# Patient Record
Sex: Female | Born: 1955
Health system: Southern US, Community
[De-identification: ages and names within clinical notes are randomized; demographics above are authoritative.]

## PROBLEM LIST (undated history)

## (undated) DIAGNOSIS — M353 Polymyalgia rheumatica: Secondary | ICD-10-CM

## (undated) DIAGNOSIS — M1990 Unspecified osteoarthritis, unspecified site: Secondary | ICD-10-CM

## (undated) DIAGNOSIS — L814 Other melanin hyperpigmentation: Secondary | ICD-10-CM

## (undated) DIAGNOSIS — I872 Venous insufficiency (chronic) (peripheral): Secondary | ICD-10-CM

## (undated) DIAGNOSIS — D649 Anemia, unspecified: Secondary | ICD-10-CM

## (undated) DIAGNOSIS — C801 Malignant (primary) neoplasm, unspecified: Secondary | ICD-10-CM

## (undated) DIAGNOSIS — T8859XA Other complications of anesthesia, initial encounter: Secondary | ICD-10-CM

## (undated) DIAGNOSIS — Z87442 Personal history of urinary calculi: Secondary | ICD-10-CM

## (undated) DIAGNOSIS — Z808 Family history of malignant neoplasm of other organs or systems: Secondary | ICD-10-CM

## (undated) DIAGNOSIS — Z6834 Body mass index (BMI) 34.0-34.9, adult: Secondary | ICD-10-CM

## (undated) HISTORY — PX: COLONOSCOPY: SHX174

## (undated) HISTORY — DX: Family history of malignant neoplasm of other organs or systems: Z80.8

## (undated) HISTORY — PX: EYE SURGERY: SHX253

## (undated) HISTORY — DX: Polymyalgia rheumatica: M35.3

## (undated) HISTORY — DX: Other melanin hyperpigmentation: L81.4

## (undated) HISTORY — DX: Body mass index (BMI) 34.0-34.9, adult: Z68.34

## (undated) HISTORY — DX: Anemia, unspecified: D64.9

## (undated) HISTORY — DX: Venous insufficiency (chronic) (peripheral): I87.2

---

## 2005-07-12 ENCOUNTER — Emergency Department (HOSPITAL_COMMUNITY): Admission: EM | Admit: 2005-07-12 | Discharge: 2005-07-13 | Payer: Self-pay | Admitting: Emergency Medicine

## 2005-07-17 ENCOUNTER — Ambulatory Visit: Payer: Self-pay | Admitting: Family Medicine

## 2005-08-25 ENCOUNTER — Ambulatory Visit: Payer: Self-pay | Admitting: Family Medicine

## 2005-09-11 ENCOUNTER — Ambulatory Visit: Payer: Self-pay | Admitting: Family Medicine

## 2005-09-11 ENCOUNTER — Encounter: Payer: Self-pay | Admitting: Family Medicine

## 2005-09-11 ENCOUNTER — Other Ambulatory Visit: Admission: RE | Admit: 2005-09-11 | Discharge: 2005-09-11 | Payer: Self-pay | Admitting: Family Medicine

## 2005-10-24 ENCOUNTER — Encounter: Admission: RE | Admit: 2005-10-24 | Discharge: 2005-10-24 | Payer: Self-pay | Admitting: Family Medicine

## 2005-11-14 ENCOUNTER — Encounter: Admission: RE | Admit: 2005-11-14 | Discharge: 2005-11-14 | Payer: Self-pay | Admitting: Family Medicine

## 2006-07-05 ENCOUNTER — Encounter: Admission: RE | Admit: 2006-07-05 | Discharge: 2006-07-05 | Payer: Self-pay | Admitting: Family Medicine

## 2007-01-02 ENCOUNTER — Encounter: Admission: RE | Admit: 2007-01-02 | Discharge: 2007-01-02 | Payer: Self-pay | Admitting: Family Medicine

## 2007-12-20 ENCOUNTER — Telehealth: Payer: Self-pay | Admitting: Family Medicine

## 2007-12-23 ENCOUNTER — Other Ambulatory Visit: Admission: RE | Admit: 2007-12-23 | Discharge: 2007-12-23 | Payer: Self-pay | Admitting: Family Medicine

## 2007-12-23 ENCOUNTER — Ambulatory Visit: Payer: Self-pay | Admitting: Family Medicine

## 2007-12-23 ENCOUNTER — Encounter: Payer: Self-pay | Admitting: Family Medicine

## 2007-12-23 DIAGNOSIS — I872 Venous insufficiency (chronic) (peripheral): Secondary | ICD-10-CM

## 2007-12-23 LAB — CONVERTED CEMR LAB
ALT: 16 units/L (ref 0–35)
AST: 20 units/L (ref 0–37)
Albumin: 4.3 g/dL (ref 3.5–5.2)
Alkaline Phosphatase: 42 units/L (ref 39–117)
BUN: 10 mg/dL (ref 6–23)
Basophils Absolute: 0 10*3/uL (ref 0.0–0.1)
Basophils Relative: 0.6 % (ref 0.0–1.0)
Bilirubin Urine: NEGATIVE
Bilirubin, Direct: 0.2 mg/dL (ref 0.0–0.3)
Blood in Urine, dipstick: NEGATIVE
CO2: 31 meq/L (ref 19–32)
Calcium: 9.8 mg/dL (ref 8.4–10.5)
Chloride: 105 meq/L (ref 96–112)
Cholesterol: 208 mg/dL (ref 0–200)
Creatinine, Ser: 0.6 mg/dL (ref 0.4–1.2)
Direct LDL: 133.2 mg/dL
Eosinophils Absolute: 0.1 10*3/uL (ref 0.0–0.7)
Eosinophils Relative: 1.4 % (ref 0.0–5.0)
GFR calc Af Amer: 136 mL/min
GFR calc non Af Amer: 112 mL/min
Glucose, Bld: 84 mg/dL (ref 70–99)
Glucose, Urine, Semiquant: NEGATIVE
HCT: 38.6 % (ref 36.0–46.0)
HDL: 61.5 mg/dL (ref >39.0)
Hemoglobin: 12.8 g/dL (ref 12.0–15.0)
Ketones, urine, test strip: NEGATIVE
Lymphocytes Relative: 42.4 % (ref 12.0–46.0)
MCHC: 33.1 g/dL (ref 30.0–36.0)
MCV: 92.3 fL (ref 78.0–100.0)
Monocytes Absolute: 0.3 10*3/uL (ref 0.1–1.0)
Monocytes Relative: 6.7 % (ref 3.0–12.0)
Neutro Abs: 2.5 10*3/uL (ref 1.4–7.7)
Neutrophils Relative %: 48.9 % (ref 43.0–77.0)
Nitrite: NEGATIVE
Platelets: 260 10*3/uL (ref 150–400)
Potassium: 4.4 meq/L (ref 3.5–5.1)
Protein, U semiquant: NEGATIVE
RBC: 4.18 M/uL (ref 3.87–5.11)
RDW: 12.2 % (ref 11.5–14.6)
Sodium: 143 meq/L (ref 135–145)
Specific Gravity, Urine: 1.015
TSH: 3.99 microintl units/mL (ref 0.35–5.50)
Total Bilirubin: 1.3 mg/dL — ABNORMAL HIGH (ref 0.3–1.2)
Total CHOL/HDL Ratio: 3.4
Total Protein: 7 g/dL (ref 6.0–8.3)
Triglycerides: 54 mg/dL (ref 0–149)
Urobilinogen, UA: 0.2
VLDL: 11 mg/dL (ref 0–40)
WBC Urine, dipstick: NEGATIVE
WBC: 5.1 10*3/uL (ref 4.5–10.5)
pH: 7.5

## 2008-01-02 ENCOUNTER — Encounter: Payer: Self-pay | Admitting: Family Medicine

## 2008-01-02 ENCOUNTER — Ambulatory Visit: Payer: Self-pay | Admitting: Internal Medicine

## 2008-01-10 ENCOUNTER — Ambulatory Visit: Payer: Self-pay | Admitting: Gastroenterology

## 2008-01-17 ENCOUNTER — Ambulatory Visit: Payer: Self-pay | Admitting: Gastroenterology

## 2008-02-09 ENCOUNTER — Telehealth: Payer: Self-pay | Admitting: *Deleted

## 2008-02-10 ENCOUNTER — Telehealth: Payer: Self-pay | Admitting: *Deleted

## 2009-02-05 ENCOUNTER — Ambulatory Visit: Payer: Self-pay | Admitting: Family Medicine

## 2009-02-05 LAB — CONVERTED CEMR LAB
ALT: 17 units/L (ref 0–35)
AST: 20 units/L (ref 0–37)
Albumin: 4.1 g/dL (ref 3.5–5.2)
Alkaline Phosphatase: 53 units/L (ref 39–117)
BUN: 14 mg/dL (ref 6–23)
Basophils Absolute: 0 10*3/uL (ref 0.0–0.1)
Basophils Relative: 1 % (ref 0.0–3.0)
Bilirubin Urine: NEGATIVE
Bilirubin, Direct: 0.1 mg/dL (ref 0.0–0.3)
CO2: 31 meq/L (ref 19–32)
Calcium: 9.4 mg/dL (ref 8.4–10.5)
Chloride: 108 meq/L (ref 96–112)
Cholesterol: 238 mg/dL — ABNORMAL HIGH (ref 0–200)
Creatinine, Ser: 0.7 mg/dL (ref 0.4–1.2)
Direct LDL: 166.3 mg/dL
Eosinophils Absolute: 0.1 10*3/uL (ref 0.0–0.7)
Eosinophils Relative: 2.3 % (ref 0.0–5.0)
GFR calc non Af Amer: 93.61 mL/min (ref >60)
Glucose, Bld: 76 mg/dL (ref 70–99)
Glucose, Urine, Semiquant: NEGATIVE
HCT: 38 % (ref 36.0–46.0)
HDL: 63.7 mg/dL (ref >39.00)
Hemoglobin: 12.9 g/dL (ref 12.0–15.0)
Ketones, urine, test strip: NEGATIVE
Lymphocytes Relative: 41.7 % (ref 12.0–46.0)
Lymphs Abs: 1.6 10*3/uL (ref 0.7–4.0)
MCHC: 33.8 g/dL (ref 30.0–36.0)
MCV: 89.3 fL (ref 78.0–100.0)
Monocytes Absolute: 0.4 10*3/uL (ref 0.1–1.0)
Monocytes Relative: 9 % (ref 3.0–12.0)
Neutro Abs: 1.8 10*3/uL (ref 1.4–7.7)
Neutrophils Relative %: 46 % (ref 43.0–77.0)
Nitrite: NEGATIVE
Platelets: 281 10*3/uL (ref 150.0–400.0)
Potassium: 3.9 meq/L (ref 3.5–5.1)
RBC: 4.26 M/uL (ref 3.87–5.11)
RDW: 12.9 % (ref 11.5–14.6)
Sodium: 144 meq/L (ref 135–145)
Specific Gravity, Urine: 1.015
TSH: 1.34 microintl units/mL (ref 0.35–5.50)
Total Bilirubin: 1.2 mg/dL (ref 0.3–1.2)
Total CHOL/HDL Ratio: 4
Total Protein: 7.3 g/dL (ref 6.0–8.3)
Triglycerides: 74 mg/dL (ref 0.0–149.0)
Urobilinogen, UA: 0.2
VLDL: 14.8 mg/dL (ref 0.0–40.0)
WBC Urine, dipstick: NEGATIVE
WBC: 3.9 10*3/uL — ABNORMAL LOW (ref 4.5–10.5)
pH: 8.5

## 2009-02-16 ENCOUNTER — Other Ambulatory Visit: Admission: RE | Admit: 2009-02-16 | Discharge: 2009-02-16 | Payer: Self-pay | Admitting: Family Medicine

## 2009-02-16 ENCOUNTER — Encounter: Payer: Self-pay | Admitting: Family Medicine

## 2009-02-16 ENCOUNTER — Ambulatory Visit: Payer: Self-pay | Admitting: Family Medicine

## 2010-02-18 ENCOUNTER — Encounter: Admission: RE | Admit: 2010-02-18 | Discharge: 2010-02-18 | Payer: Self-pay | Admitting: Family Medicine

## 2010-03-04 ENCOUNTER — Ambulatory Visit: Payer: Self-pay | Admitting: Family Medicine

## 2010-03-04 LAB — CONVERTED CEMR LAB
ALT: 15 units/L (ref 0–35)
AST: 18 units/L (ref 0–37)
Albumin: 4.4 g/dL (ref 3.5–5.2)
Alkaline Phosphatase: 56 units/L (ref 39–117)
BUN: 20 mg/dL (ref 6–23)
Basophils Absolute: 0 10*3/uL (ref 0.0–0.1)
Basophils Relative: 0.7 % (ref 0.0–3.0)
Bilirubin Urine: NEGATIVE
Bilirubin, Direct: 0.2 mg/dL (ref 0.0–0.3)
CO2: 30 meq/L (ref 19–32)
Calcium: 9.3 mg/dL (ref 8.4–10.5)
Chloride: 105 meq/L (ref 96–112)
Cholesterol: 239 mg/dL — ABNORMAL HIGH (ref 0–200)
Creatinine, Ser: 0.6 mg/dL (ref 0.4–1.2)
Direct LDL: 156.6 mg/dL
Eosinophils Absolute: 0.1 10*3/uL (ref 0.0–0.7)
Eosinophils Relative: 2.2 % (ref 0.0–5.0)
GFR calc non Af Amer: 118.16 mL/min (ref >60)
Glucose, Bld: 79 mg/dL (ref 70–99)
Glucose, Urine, Semiquant: NEGATIVE
HCT: 35.5 % — ABNORMAL LOW (ref 36.0–46.0)
HDL: 74.1 mg/dL (ref >39.00)
Hemoglobin: 12.2 g/dL (ref 12.0–15.0)
Ketones, urine, test strip: NEGATIVE
Lymphocytes Relative: 37.4 % (ref 12.0–46.0)
Lymphs Abs: 1.3 10*3/uL (ref 0.7–4.0)
MCHC: 34.3 g/dL (ref 30.0–36.0)
MCV: 90 fL (ref 78.0–100.0)
Monocytes Absolute: 0.2 10*3/uL (ref 0.1–1.0)
Monocytes Relative: 7 % (ref 3.0–12.0)
Neutro Abs: 1.8 10*3/uL (ref 1.4–7.7)
Neutrophils Relative %: 52.7 % (ref 43.0–77.0)
Nitrite: NEGATIVE
Platelets: 304 10*3/uL (ref 150.0–400.0)
Potassium: 3.4 meq/L — ABNORMAL LOW (ref 3.5–5.1)
RBC: 3.94 M/uL (ref 3.87–5.11)
RDW: 13.8 % (ref 11.5–14.6)
Sodium: 144 meq/L (ref 135–145)
Specific Gravity, Urine: 1.02
TSH: 1.32 microintl units/mL (ref 0.35–5.50)
Total Bilirubin: 1.2 mg/dL (ref 0.3–1.2)
Total CHOL/HDL Ratio: 3
Total Protein: 7.1 g/dL (ref 6.0–8.3)
Triglycerides: 51 mg/dL (ref 0.0–149.0)
Urobilinogen, UA: 1
VLDL: 10.2 mg/dL (ref 0.0–40.0)
WBC: 3.4 10*3/uL — ABNORMAL LOW (ref 4.5–10.5)
pH: 7

## 2010-03-10 ENCOUNTER — Ambulatory Visit: Payer: Self-pay | Admitting: Family Medicine

## 2010-03-10 DIAGNOSIS — H6120 Impacted cerumen, unspecified ear: Secondary | ICD-10-CM

## 2010-03-10 DIAGNOSIS — N952 Postmenopausal atrophic vaginitis: Secondary | ICD-10-CM

## 2010-03-17 ENCOUNTER — Ambulatory Visit: Payer: Self-pay | Admitting: Family Medicine

## 2010-03-29 DIAGNOSIS — L820 Inflamed seborrheic keratosis: Secondary | ICD-10-CM | POA: Insufficient documentation

## 2010-10-25 NOTE — Assessment & Plan Note (Signed)
Summary: cPX/PAP/NJR   Vital Signs:  Patient profile:   55 year old female Menstrual status:  postmenopausal Height:      62 inches Weight:      190 pounds BMI:     34.88 Temp:     98.0 degrees F oral BP sitting:   120 / 80  (left arm)  Vitals Entered By: Kathrynn Speed CMA (March 10, 2010 3:55 PM) CC: CPX with lab fu   CC:  CPX with lab fu.  History of Present Illness: Ariel Rios is a delightful, 55 year old, married female, nonsmoker, who comes in today for physical examination  She takes hydrochlorothiazide 25 mg q.a.m. for venous insufficiency.  She is quit using the Premarin vaginal cream.  She gets routine eye care.  Dental care.  BSE monthly, however, does get annual mammography.  Tetanus 2006 does not get a flu shot encouraged to get annual flu shot.  Colonoscopy 3 years ago, normal.  Mammogram last week.  Normal.  She does have markedly decreased in her hearing thinks her ears.  May be full of wax  Current Medications (verified): 1)  Hydrochlorothiazide 25 Mg  Tabs (Hydrochlorothiazide) .... Once Daily 2)  Multivitamins   Tabs (Multiple Vitamin) .... Once Daily  Allergies (verified): No Known Drug Allergies  Past History:  Past medical, surgical, family and social histories (including risk factors) reviewed, and no changes noted (except as noted below).  Past Medical History: Reviewed history from 12/23/2007 and no changes required. childbirth x 2 venous insufficiency migraine headaches, which have stopped since she quit having her periods  Family History: Reviewed history from 12/23/2007 and no changes required. father is in his 54s have an AVM.  Mother in her 103s.  Joint disease and mild diabetes.  Three brothers in good health.  No sisters  Social History: Reviewed history from 12/23/2007 and no changes required. Occupation: [employer] Married Never Smoked Alcohol use-no Drug use-no Regular exercise-yes  Review of Systems      See  HPI  Physical Exam  General:  Well-developed,well-nourished,in no acute distress; alert,appropriate and cooperative throughout examination Head:  Normocephalic and atraumatic without obvious abnormalities. No apparent alopecia or balding. Eyes:  No corneal or conjunctival inflammation noted. EOMI. Perrla. Funduscopic exam benign, without hemorrhages, exudates or papilledema. Vision grossly normal. Ears:  bilateral cerumen impactions Nose:  External nasal examination shows no deformity or inflammation. Nasal mucosa are pink and moist without lesions or exudates. Mouth:  Oral mucosa and oropharynx without lesions or exudates.  Teeth in good repair. Neck:  No deformities, masses, or tenderness noted. Chest Wall:  No deformities, masses, or tenderness noted. Breasts:  No mass, nodules, thickening, tenderness, bulging, retraction, inflamation, nipple discharge or skin changes noted.   Lungs:  Normal respiratory effort, chest expands symmetrically. Lungs are clear to auscultation, no crackles or wheezes. Heart:  Normal rate and regular rhythm. S1 and S2 normal without gallop, murmur, click, rub or other extra sounds. Abdomen:  Bowel sounds positive,abdomen soft and non-tender without masses, organomegaly or hernias noted. Rectal:  No external abnormalities noted. Normal sphincter tone. No rectal masses or tenderness. Genitalia:  Pelvic Exam:        External: normal female genitalia without lesions or masses        Vagina: normal without lesions or masses        Cervix: normal without lesions or masses        Adnexa: normal bimanual exam without masses or fullness  Uterus: normal by palpation        Pap smear: not performed Msk:  No deformity or scoliosis noted of thoracic or lumbar spine.   Pulses:  R and L carotid,radial,femoral,dorsalis pedis and posterior tibial pulses are full and equal bilaterally Extremities:  No clubbing, cyanosis, edema, or deformity noted with normal full range of  motion of all joints.   Neurologic:  No cranial nerve deficits noted. Station and gait are normal. Plantar reflexes are down-going bilaterally. DTRs are symmetrical throughout. Sensory, motor and coordinative functions appear intact. Skin:  total body skin exam normal except for a red, irritated lesion on her left wrist.  Advised to return ASAP for removal Cervical Nodes:  No lymphadenopathy noted Axillary Nodes:  No palpable lymphadenopathy Inguinal Nodes:  No significant adenopathy Psych:  Cognition and judgment appear intact. Alert and cooperative with normal attention span and concentration. No apparent delusions, illusions, hallucinations   Impression & Recommendations:  Problem # 1:  Preventive Health Care (ICD-V70.0) Assessment Unchanged  Problem # 2:  VENOUS INSUFFICIENCY, MILD (ICD-459.81) Assessment: Unchanged  Orders: EKG w/ Interpretation (93000)  Problem # 3:  VAGINITIS, ATROPHIC (ICD-627.3) Assessment: Deteriorated  The following medications were removed from the medication list:    Estrace 0.1 Mg/gm Crea (Estradiol) .[name]... Apply 2 x week Her updated medication list for this problem includes:    Estrace 0.1 Mg/gm Crea (Estradiol) ..... Uad  Complete Medication List: 1)  Hydrochlorothiazide 25 Mg Tabs (Hydrochlorothiazide) .... Once daily 2)  Multivitamins Tabs (Multiple vitamin) .... Once daily 3)  Estrace 0.1 Mg/gm Crea (Estradiol) .... Uad  Patient Instructions: 1)  return ASAP for removal of the lesion on her left wrist. 2)  Please schedule a follow-up appointment in 1 year. 3)  It is important that you exercise regularly at least 20 minutes 5 times a week. If you develop chest pain, have severe difficulty breathing, or feel very tired , stop exercising immediately and seek medical attention. 4)  You need to lose weight. Consider a lower calorie diet and regular exercise.  5)  Schedule your mammogram. 6)  Schedule a colonoscopy/sigmoidoscopy to help detect colon  cancer. 7)  Take calcium +Vitamin D daily. 8)  Take an Aspirin every day. Prescriptions: HYDROCHLOROTHIAZIDE 25 MG  TABS (HYDROCHLOROTHIAZIDE) once daily  #100 x 4   Entered and Authorized by:   Roderick Pee MD   Signed by:   Roderick Pee MD on 03/10/2010   Method used:   Print then Give to Patient   RxID:   [id number] ESTRACE 0.1 MG/GM CREA (ESTRADIOL) UAD  #PP x 6   Entered and Authorized by:   Roderick Pee MD   Signed by:   Roderick Pee MD on 03/10/2010   Method used:   Print then Give to Patient   RxID:   [id number]

## 2010-10-25 NOTE — Miscellaneous (Signed)
Summary: Consent for Mole Removal  Consent for Mole Removal   Imported By: Maryln Gottron 03/24/2010 11:13:54  _____________________________________________________________________  External Attachment:    Type:   Image     Comment:   External Document

## 2010-10-25 NOTE — Assessment & Plan Note (Signed)
Summary: mole removal/ccm   Allergies: No Known Drug Allergies   Complete Medication List: 1)  Hydrochlorothiazide 25 Mg Tabs (Hydrochlorothiazide) .... Once daily 2)  Multivitamins Tabs (Multiple vitamin) .... Once daily 3)  Estrace 0.1 Mg/gm Crea (Estradiol) .... Uad  Other Orders: Shave Skin Lesion 0.6-1.0 cm/trunk/arm/leg (11301)

## 2011-03-12 ENCOUNTER — Emergency Department (HOSPITAL_COMMUNITY): Payer: Managed Care, Other (non HMO)

## 2011-03-12 ENCOUNTER — Emergency Department (HOSPITAL_COMMUNITY)
Admission: EM | Admit: 2011-03-12 | Discharge: 2011-03-12 | Disposition: A | Discharge status: Home / Self Care | Payer: Managed Care, Other (non HMO) | Attending: Emergency Medicine | Admitting: Emergency Medicine

## 2011-03-12 DIAGNOSIS — R109 Unspecified abdominal pain: Secondary | ICD-10-CM | POA: Insufficient documentation

## 2011-03-12 DIAGNOSIS — F411 Generalized anxiety disorder: Secondary | ICD-10-CM | POA: Insufficient documentation

## 2011-03-12 DIAGNOSIS — N201 Calculus of ureter: Secondary | ICD-10-CM | POA: Insufficient documentation

## 2011-03-12 DIAGNOSIS — K5730 Diverticulosis of large intestine without perforation or abscess without bleeding: Secondary | ICD-10-CM | POA: Insufficient documentation

## 2011-03-12 LAB — URINE MICROSCOPIC-ADD ON

## 2011-03-12 LAB — CBC
HCT: 38.5 % (ref 36.0–46.0)
Hemoglobin: 13.1 g/dL (ref 12.0–15.0)
MCH: 28.8 pg (ref 26.0–34.0)
MCHC: 34 g/dL (ref 30.0–36.0)
MCV: 84.6 fL (ref 78.0–100.0)
Platelets: 281 10*3/uL (ref 150–400)
RBC: 4.55 MIL/uL (ref 3.87–5.11)
RDW: 13.5 % (ref 11.5–15.5)
WBC: 5.5 10*3/uL (ref 4.0–10.5)

## 2011-03-12 LAB — COMPREHENSIVE METABOLIC PANEL
ALT: 12 U/L (ref 0–35)
AST: 17 U/L (ref 0–37)
Albumin: 4.2 g/dL (ref 3.5–5.2)
Alkaline Phosphatase: 63 U/L (ref 39–117)
BUN: 22 mg/dL (ref 6–23)
CO2: 26 mEq/L (ref 19–32)
Calcium: 10 mg/dL (ref 8.4–10.5)
Chloride: 102 mEq/L (ref 96–112)
Creatinine, Ser: 0.53 mg/dL (ref 0.50–1.10)
GFR calc Af Amer: >60 mL/min (ref >60)
GFR calc non Af Amer: >60 mL/min (ref >60)
Glucose, Bld: 103 mg/dL — ABNORMAL HIGH (ref 70–99)
Potassium: 3.3 mEq/L — ABNORMAL LOW (ref 3.5–5.1)
Sodium: 141 mEq/L (ref 135–145)
Total Bilirubin: 0.8 mg/dL (ref 0.3–1.2)
Total Protein: 7.7 g/dL (ref 6.0–8.3)

## 2011-03-12 LAB — URINALYSIS, ROUTINE W REFLEX MICROSCOPIC
Bilirubin Urine: NEGATIVE
Glucose, UA: NEGATIVE mg/dL
Ketones, ur: NEGATIVE mg/dL
Leukocytes, UA: NEGATIVE
Nitrite: NEGATIVE
Protein, ur: NEGATIVE mg/dL
Specific Gravity, Urine: >1.046 — ABNORMAL HIGH (ref 1.005–1.030)
Urobilinogen, UA: 0.2 mg/dL (ref 0.0–1.0)
pH: 6 (ref 5.0–8.0)

## 2011-03-12 LAB — LIPASE, BLOOD: Lipase: 39 U/L (ref 11–59)

## 2011-03-12 LAB — DIFFERENTIAL
Basophils Absolute: 0 10*3/uL (ref 0.0–0.1)
Basophils Relative: 0 % (ref 0–1)
Eosinophils Absolute: 0.1 10*3/uL (ref 0.0–0.7)
Eosinophils Relative: 2 % (ref 0–5)
Lymphocytes Relative: 24 % (ref 12–46)
Lymphs Abs: 1.3 10*3/uL (ref 0.7–4.0)
Monocytes Absolute: 0.4 10*3/uL (ref 0.1–1.0)
Monocytes Relative: 8 % (ref 3–12)
Neutro Abs: 3.6 10*3/uL (ref 1.7–7.7)
Neutrophils Relative %: 66 % (ref 43–77)

## 2011-03-12 MED ORDER — IOHEXOL 300 MG/ML  SOLN
125.0000 mL | Freq: Once | INTRAMUSCULAR | Status: AC | PRN
  Administered 2011-03-12: 125 mL via INTRAVENOUS

## 2011-03-13 LAB — URINE CULTURE
Colony Count: 5000
Culture  Setup Time: 201206171801

## 2011-05-26 ENCOUNTER — Ambulatory Visit (HOSPITAL_BASED_OUTPATIENT_CLINIC_OR_DEPARTMENT_OTHER)
Admission: RE | Admit: 2011-05-26 | Discharge: 2011-05-26 | Disposition: A | Discharge status: Home / Self Care | Payer: Managed Care, Other (non HMO) | Source: Ambulatory Visit | Attending: Urology | Admitting: Urology

## 2011-05-26 ENCOUNTER — Ambulatory Visit (HOSPITAL_COMMUNITY)
Admission: RE | Admit: 2011-05-26 | Discharge: 2011-05-26 | Disposition: A | Discharge status: Home / Self Care | Payer: Managed Care, Other (non HMO) | Source: Ambulatory Visit | Attending: Urology | Admitting: Urology

## 2011-05-26 DIAGNOSIS — Z01812 Encounter for preprocedural laboratory examination: Secondary | ICD-10-CM | POA: Insufficient documentation

## 2011-05-26 DIAGNOSIS — N201 Calculus of ureter: Secondary | ICD-10-CM | POA: Insufficient documentation

## 2011-05-26 DIAGNOSIS — R609 Edema, unspecified: Secondary | ICD-10-CM | POA: Insufficient documentation

## 2011-05-26 DIAGNOSIS — Z01818 Encounter for other preprocedural examination: Secondary | ICD-10-CM | POA: Insufficient documentation

## 2011-05-26 DIAGNOSIS — R109 Unspecified abdominal pain: Secondary | ICD-10-CM | POA: Insufficient documentation

## 2011-05-26 DIAGNOSIS — N8111 Cystocele, midline: Secondary | ICD-10-CM | POA: Insufficient documentation

## 2011-05-26 DIAGNOSIS — Z79899 Other long term (current) drug therapy: Secondary | ICD-10-CM | POA: Insufficient documentation

## 2011-05-26 LAB — POCT I-STAT 4, (NA,K, GLUC, HGB,HCT)
Glucose, Bld: 92 mg/dL (ref 70–99)
HCT: 39 % (ref 36.0–46.0)
Hemoglobin: 13.3 g/dL (ref 12.0–15.0)
Potassium: 3.3 mEq/L — ABNORMAL LOW (ref 3.5–5.1)
Sodium: 141 mEq/L (ref 135–145)

## 2011-06-08 NOTE — Op Note (Addendum)
  NAMEBRION, HEDGES NO.:  [id number]  MEDICAL RECORD NO.:  [id number]  LOCATION:  XRAY                         FACILITY:  [facility] ([place])  PHYSICIAN:  Britteny Fiebelkorn C. Vernie Ammons, M.D.  DATE OF BIRTH:  07-01-1956  DATE OF PROCEDURE:  05/26/2011 DATE OF DISCHARGE:                              OPERATIVE REPORT   PREOPERATIVE DIAGNOSIS:  History of left ureteral stone.  POSTOPERATIVE DIAGNOSIS:  History of left ureteral stone.  PROCEDURE:  Cystoscopy with left retrograde pyelogram including interpretation.  SURGEON:  Emrys Mceachron C. Vernie Ammons, M.D.  ANESTHESIA:  General.  BLOOD LOSS:  Zero.  DRAINS:  None.  SPECIMENS:  None.  COMPLICATIONS:  None.  INDICATIONS:  The patient is a 55 year old female with a history of a left ureteral stone that was seen originally on a CT scan on March 12, 2011.  It was 4 mm in size and located at the ureterovesical junction with no renal calculi having been identified.  She continued to have difficulty with the stone and it appeared to have not passed, so we therefore discussed ureteroscopic extraction of the stone.  The procedure, its risks and complications as well as alternatives have been discussed.  The patient understands and has elected to proceed.  Of note, she has not had any pain and has not seen her stone pass.  DESCRIPTION OF OPERATION:  After informed consent, the patient was brought to the major OR, placed on table, administered general anesthesia, then moved to the dorsal lithotomy position.  Her genitalia was sterilely prepped and draped and an official time-out was then performed.  A 22-French cystoscope with 12-degree lens was then advanced through the urethra under direct vision into the bladder.  The bladder was fully inspected visually and noted to be free of any tumor, stones, or inflammatory lesions.  The ureteral orifices were of normal configuration and position.  There was obvious cystocele noted, both visually at the  level of the introitus and changes of the bladder consistent with that.  A 6-French open-ended ureteral catheter was passed through the cystoscope and into the left ureteral orifice and full strength contrast was then injected under direct fluoroscopic control to perform retrograde pyelogram in the standard fashion.  As I injected the contrast up the left ureter, I noted no filling defects, mass effect, or other abnormality of the ureter throughout its course.  The intrarenal collecting system was noted to be normal as well.  Specifically, there were no stones in the area of the ureterovesical junction.  I then removed the open-ended catheter and under fluoroscopy watched as the contrast passed unobstructed down the ureter and out the ureteral orifice.  I therefore drained the bladder, removed the cystoscope and the patient was awakened and taken to recovery room in stable and satisfactory condition.  There were no intraoperative complications, and she tolerated the procedure well.     Marcelo Ickes C. Vernie Ammons, M.D.     MCO/MEDQ  D:  05/26/2011  T:  05/26/2011  Job:  [id number]  Electronically Signed by Ihor Gully M.D. on 06/08/2011 04:22:24 AM

## 2011-06-23 ENCOUNTER — Other Ambulatory Visit: Payer: Self-pay | Admitting: Family Medicine

## 2011-06-23 DIAGNOSIS — Z1231 Encounter for screening mammogram for malignant neoplasm of breast: Secondary | ICD-10-CM

## 2011-06-30 ENCOUNTER — Ambulatory Visit
Admission: RE | Admit: 2011-06-30 | Discharge: 2011-06-30 | Disposition: A | Discharge status: Home / Self Care | Payer: Managed Care, Other (non HMO) | Source: Ambulatory Visit | Attending: Family Medicine | Admitting: Family Medicine

## 2011-06-30 DIAGNOSIS — Z1231 Encounter for screening mammogram for malignant neoplasm of breast: Secondary | ICD-10-CM

## 2011-07-05 ENCOUNTER — Other Ambulatory Visit: Payer: Self-pay | Admitting: Family Medicine

## 2011-07-05 DIAGNOSIS — R928 Other abnormal and inconclusive findings on diagnostic imaging of breast: Secondary | ICD-10-CM

## 2011-07-19 ENCOUNTER — Ambulatory Visit
Admission: RE | Admit: 2011-07-19 | Discharge: 2011-07-19 | Disposition: A | Discharge status: Home / Self Care | Payer: Managed Care, Other (non HMO) | Source: Ambulatory Visit | Attending: Family Medicine | Admitting: Family Medicine

## 2011-07-19 DIAGNOSIS — R928 Other abnormal and inconclusive findings on diagnostic imaging of breast: Secondary | ICD-10-CM

## 2011-08-25 ENCOUNTER — Other Ambulatory Visit (INDEPENDENT_AMBULATORY_CARE_PROVIDER_SITE_OTHER): Payer: Managed Care, Other (non HMO)

## 2011-08-25 DIAGNOSIS — Z0000 Encounter for general adult medical examination without abnormal findings: Secondary | ICD-10-CM

## 2011-08-25 LAB — BASIC METABOLIC PANEL
Calcium: 9.4 mg/dL (ref 8.4–10.5)
Creatinine, Ser: 0.7 mg/dL (ref 0.4–1.2)
GFR: 97.51 mL/min (ref >60.00)
Glucose, Bld: 82 mg/dL (ref 70–99)
Sodium: 142 mEq/L (ref 135–145)

## 2011-08-25 LAB — LIPID PANEL
HDL: 60.5 mg/dL (ref >39.00)
Total CHOL/HDL Ratio: 4
Triglycerides: 103 mg/dL (ref 0.0–149.0)
VLDL: 20.6 mg/dL (ref 0.0–40.0)

## 2011-08-25 LAB — LDL CHOLESTEROL, DIRECT: Direct LDL: 155.4 mg/dL

## 2011-08-25 LAB — HEPATIC FUNCTION PANEL
AST: 17 U/L (ref 0–37)
Albumin: 4.5 g/dL (ref 3.5–5.2)
Alkaline Phosphatase: 56 U/L (ref 39–117)
Bilirubin, Direct: 0 mg/dL (ref 0.0–0.3)

## 2011-08-25 LAB — CBC WITH DIFFERENTIAL/PLATELET
Basophils Absolute: 0 10*3/uL (ref 0.0–0.1)
Eosinophils Absolute: 0.1 10*3/uL (ref 0.0–0.7)
Hemoglobin: 12.8 g/dL (ref 12.0–15.0)
Lymphocytes Relative: 34.1 % (ref 12.0–46.0)
Monocytes Relative: 6.4 % (ref 3.0–12.0)
Neutro Abs: 2.3 10*3/uL (ref 1.4–7.7)
Neutrophils Relative %: 56.8 % (ref 43.0–77.0)
RBC: 4.29 Mil/uL (ref 3.87–5.11)
RDW: 14.3 % (ref 11.5–14.6)

## 2011-08-25 LAB — POCT URINALYSIS DIPSTICK
Nitrite, UA: NEGATIVE
Urobilinogen, UA: 1
pH, UA: 7

## 2011-08-29 ENCOUNTER — Ambulatory Visit (INDEPENDENT_AMBULATORY_CARE_PROVIDER_SITE_OTHER): Payer: Managed Care, Other (non HMO) | Admitting: Family Medicine

## 2011-08-29 ENCOUNTER — Encounter: Payer: Self-pay | Admitting: Family Medicine

## 2011-08-29 ENCOUNTER — Other Ambulatory Visit (HOSPITAL_COMMUNITY)
Admission: RE | Admit: 2011-08-29 | Discharge: 2011-08-29 | Disposition: A | Discharge status: Home / Self Care | Payer: Managed Care, Other (non HMO) | Source: Ambulatory Visit | Attending: Family Medicine | Admitting: Family Medicine

## 2011-08-29 DIAGNOSIS — N952 Postmenopausal atrophic vaginitis: Secondary | ICD-10-CM

## 2011-08-29 DIAGNOSIS — I872 Venous insufficiency (chronic) (peripheral): Secondary | ICD-10-CM

## 2011-08-29 DIAGNOSIS — Z0000 Encounter for general adult medical examination without abnormal findings: Secondary | ICD-10-CM

## 2011-08-29 DIAGNOSIS — Z01419 Encounter for gynecological examination (general) (routine) without abnormal findings: Secondary | ICD-10-CM | POA: Insufficient documentation

## 2011-08-29 DIAGNOSIS — Z23 Encounter for immunization: Secondary | ICD-10-CM

## 2011-08-29 MED ORDER — HYDROCHLOROTHIAZIDE 25 MG PO TABS: 25.0000 mg | ORAL_TABLET | Freq: Every day | ORAL | Status: DC

## 2011-08-29 MED ORDER — ESTRADIOL 0.1 MG/GM VA CREA: 2.0000 g | TOPICAL_CREAM | Freq: Every day | VAGINAL | Status: DC

## 2011-08-29 NOTE — Patient Instructions (Signed)
Continue your current medications.  Follow-up in one year, sooner if any problems 

## 2011-08-29 NOTE — Progress Notes (Signed)
  Subjective:    Patient ID: Ariel Rios, female    DOB: 24-May-1956, 55 y.o.   MRN: [id number]  HPI  Ariel Rios is a 55 year old, married female, nonsmoker, who comes in today for general physical examination  She is had difficulty with atrophic vaginitis and was given the Premarin vaginal cream, however, she's not been using it.  She does have a grade 1 cystocele with some urinary leakage.  She takes Lipitor thiazide 25 mg daily for some venous insufficiency.  Tetanus 2,006 seasonal flu shot today  Review of Systems  Constitutional: Negative.   HENT: Negative.   Eyes: Negative.   Respiratory: Negative.   Cardiovascular: Negative.   Gastrointestinal: Negative.   Genitourinary: Negative.   Musculoskeletal: Negative.   Neurological: Negative.   Hematological: Negative.   Psychiatric/Behavioral: Negative.        Objective:   Physical Exam  Constitutional: She appears well-developed and well-nourished.  HENT:  Head: Normocephalic and atraumatic.  Right Ear: External ear normal.  Left Ear: External ear normal.  Nose: Nose normal.  Mouth/Throat: Oropharynx is clear and moist.  Eyes: EOM are normal. Pupils are equal, round, and reactive to light.  Neck: Normal range of motion. Neck supple. No thyromegaly present.  Cardiovascular: Normal rate, regular rhythm, normal heart sounds and intact distal pulses.  Exam reveals no gallop and no friction rub.   No murmur heard. Pulmonary/Chest: Effort normal and breath sounds normal.  Abdominal: Soft. Bowel sounds are normal. She exhibits no distension and no mass. There is no tenderness. There is no rebound.  Genitourinary: Vagina normal and uterus normal. Guaiac negative stool. No vaginal discharge found.       She does have a grade 1 cystocele.  Bilateral breast exam shows the right breast to have small multiple diffuse fibrocystic changes.  Left breast shows a walnut-sized lesion, left breast at the two o'clock position just adjacent  to the areola.  Mammogram and ultrasound in September.  Normal  Musculoskeletal: Normal range of motion.  Lymphadenopathy:    She has no cervical adenopathy.  Neurological: She is alert. She has normal reflexes. No cranial nerve deficit. She exhibits normal muscle tone. Coordination normal.  Skin: Skin is warm and dry.  Psychiatric: She has a normal mood and affect. Her behavior is normal. Judgment and thought content normal.          Assessment & Plan:  Healthy female.  Atrophic vaginitis encouraged hormonal cream twice weekly.  Fluid retention, hydrochlorothiazide 25 daily.  Return one year, sooner if any problems

## 2011-09-04 ENCOUNTER — Telehealth: Payer: Self-pay | Admitting: Family Medicine

## 2011-09-04 DIAGNOSIS — I872 Venous insufficiency (chronic) (peripheral): Secondary | ICD-10-CM

## 2011-09-04 MED ORDER — HYDROCHLOROTHIAZIDE 25 MG PO TABS: 25.0000 mg | ORAL_TABLET | Freq: Every day | ORAL | Status: DC

## 2011-09-04 NOTE — Telephone Encounter (Signed)
Pt called and said that [facility] will not release pt script for hydrochlorothiazide (HYDRODIURIL) 25 MG tablet unless the doctor says that this is ok to fill. Pls call the mail order pharmacy and get this taken care of so pt can get her medicine.

## 2011-10-13 ENCOUNTER — Other Ambulatory Visit: Payer: Self-pay | Admitting: Family Medicine

## 2011-10-13 DIAGNOSIS — N63 Unspecified lump in unspecified breast: Secondary | ICD-10-CM

## 2011-10-24 ENCOUNTER — Ambulatory Visit
Admission: RE | Admit: 2011-10-24 | Discharge: 2011-10-24 | Disposition: A | Discharge status: Home / Self Care | Payer: Managed Care, Other (non HMO) | Source: Ambulatory Visit | Attending: Family Medicine | Admitting: Family Medicine

## 2011-10-24 DIAGNOSIS — N63 Unspecified lump in unspecified breast: Secondary | ICD-10-CM

## 2012-02-09 ENCOUNTER — Other Ambulatory Visit: Payer: Self-pay | Admitting: Family Medicine

## 2012-02-09 DIAGNOSIS — N63 Unspecified lump in unspecified breast: Secondary | ICD-10-CM

## 2012-02-16 ENCOUNTER — Ambulatory Visit
Admission: RE | Admit: 2012-02-16 | Discharge: 2012-02-16 | Disposition: A | Discharge status: Home / Self Care | Payer: Managed Care, Other (non HMO) | Source: Ambulatory Visit | Attending: Family Medicine | Admitting: Family Medicine

## 2012-02-16 DIAGNOSIS — N63 Unspecified lump in unspecified breast: Secondary | ICD-10-CM

## 2012-06-18 ENCOUNTER — Ambulatory Visit (INDEPENDENT_AMBULATORY_CARE_PROVIDER_SITE_OTHER): Payer: Managed Care, Other (non HMO)

## 2012-06-18 DIAGNOSIS — Z23 Encounter for immunization: Secondary | ICD-10-CM

## 2012-08-30 ENCOUNTER — Other Ambulatory Visit (INDEPENDENT_AMBULATORY_CARE_PROVIDER_SITE_OTHER): Payer: Managed Care, Other (non HMO)

## 2012-08-30 DIAGNOSIS — Z0000 Encounter for general adult medical examination without abnormal findings: Secondary | ICD-10-CM

## 2012-08-30 LAB — POCT URINALYSIS DIPSTICK
Glucose, UA: NEGATIVE
Nitrite, UA: NEGATIVE
Protein, UA: NEGATIVE
Urobilinogen, UA: 1
pH, UA: 6

## 2012-08-30 LAB — BASIC METABOLIC PANEL
BUN: 16 mg/dL (ref 6–23)
Calcium: 9.4 mg/dL (ref 8.4–10.5)
Chloride: 104 mEq/L (ref 96–112)
Creatinine, Ser: 0.5 mg/dL (ref 0.4–1.2)

## 2012-08-30 LAB — CBC WITH DIFFERENTIAL/PLATELET
Basophils Relative: 0.7 % (ref 0.0–3.0)
Eosinophils Absolute: 0.1 10*3/uL (ref 0.0–0.7)
Eosinophils Relative: 1.5 % (ref 0.0–5.0)
Lymphocytes Relative: 37.5 % (ref 12.0–46.0)
MCV: 88.5 fl (ref 78.0–100.0)
Monocytes Absolute: 0.3 10*3/uL (ref 0.1–1.0)
Neutrophils Relative %: 52.8 % (ref 43.0–77.0)
Platelets: 292 10*3/uL (ref 150.0–400.0)
RBC: 4.32 Mil/uL (ref 3.87–5.11)
WBC: 3.8 10*3/uL — ABNORMAL LOW (ref 4.5–10.5)

## 2012-08-30 LAB — HEPATIC FUNCTION PANEL
AST: 19 U/L (ref 0–37)
Albumin: 4.2 g/dL (ref 3.5–5.2)
Total Bilirubin: 1.2 mg/dL (ref 0.3–1.2)

## 2012-08-30 LAB — LDL CHOLESTEROL, DIRECT: Direct LDL: 140.8 mg/dL

## 2012-08-30 LAB — LIPID PANEL: Triglycerides: 66 mg/dL (ref 0.0–149.0)

## 2012-08-30 LAB — TSH: TSH: 1.77 u[IU]/mL (ref 0.35–5.50)

## 2012-09-05 ENCOUNTER — Ambulatory Visit (INDEPENDENT_AMBULATORY_CARE_PROVIDER_SITE_OTHER): Payer: Managed Care, Other (non HMO) | Admitting: Family Medicine

## 2012-09-05 ENCOUNTER — Other Ambulatory Visit (HOSPITAL_COMMUNITY)
Admission: RE | Admit: 2012-09-05 | Discharge: 2012-09-05 | Disposition: A | Discharge status: Home / Self Care | Payer: Managed Care, Other (non HMO) | Source: Ambulatory Visit | Attending: Family Medicine | Admitting: Family Medicine

## 2012-09-05 ENCOUNTER — Encounter: Payer: Self-pay | Admitting: Family Medicine

## 2012-09-05 VITALS — BP 120/80 | Temp 98.0°F | Ht 62.5 in | Wt 198.0 lb

## 2012-09-05 DIAGNOSIS — N952 Postmenopausal atrophic vaginitis: Secondary | ICD-10-CM

## 2012-09-05 DIAGNOSIS — Z0000 Encounter for general adult medical examination without abnormal findings: Secondary | ICD-10-CM

## 2012-09-05 DIAGNOSIS — H6120 Impacted cerumen, unspecified ear: Secondary | ICD-10-CM

## 2012-09-05 DIAGNOSIS — I872 Venous insufficiency (chronic) (peripheral): Secondary | ICD-10-CM

## 2012-09-05 DIAGNOSIS — L820 Inflamed seborrheic keratosis: Secondary | ICD-10-CM

## 2012-09-05 DIAGNOSIS — Z01419 Encounter for gynecological examination (general) (routine) without abnormal findings: Secondary | ICD-10-CM | POA: Insufficient documentation

## 2012-09-05 MED ORDER — HYDROCHLOROTHIAZIDE 25 MG PO TABS: 25.0000 mg | ORAL_TABLET | Freq: Every day | ORAL | Status: DC

## 2012-09-05 NOTE — Progress Notes (Signed)
  Subjective:    Patient ID: Ariel Rios, female    DOB: June 23, 1956, 56 y.o.   MRN: [id number]  HPI Ariel Rios is a 56 year old married female nonsmoker who comes in today for general physical examination  She has a history of venous insufficiency and takes a low dose diuretic daily 25 mg of hydrochlorothiazide  She has a history of postmenopausal vaginal dryness and uses hormonal cream small amounts twice weekly  She does not get routine dental nor eye exams. Recommended Dr. Vonna Kotyk and Dr. Alvester Morin  She does do BSE monthly and gets annual mammography. Colonoscopy at age 56 normal. Vaccinations up-to-date   Review of Systems  Constitutional: Negative.   HENT: Negative.   Eyes: Negative.   Respiratory: Negative.   Cardiovascular: Negative.   Gastrointestinal: Negative.   Genitourinary: Negative.   Musculoskeletal: Negative.   Neurological: Negative.   Hematological: Negative.   Psychiatric/Behavioral: Negative.        Objective:   Physical Exam  Constitutional: She appears well-developed and well-nourished.  HENT:  Head: Normocephalic and atraumatic.  Right Ear: External ear normal.  Left Ear: External ear normal.  Nose: Nose normal.  Mouth/Throat: Oropharynx is clear and moist.  Eyes: EOM are normal. Pupils are equal, round, and reactive to light.  Neck: Normal range of motion. Neck supple. No thyromegaly present.  Cardiovascular: Normal rate, regular rhythm, normal heart sounds and intact distal pulses.  Exam reveals no gallop and no friction rub.   No murmur heard. Pulmonary/Chest: Effort normal and breath sounds normal.  Abdominal: Soft. Bowel sounds are normal. She exhibits no distension and no mass. There is no tenderness. There is no rebound.  Genitourinary: Vagina normal and uterus normal. Guaiac negative stool. No vaginal discharge found.       Bilateral breast exam shows multiple fibrocystic changes throughout both breasts most prominent is a normal-size lesion left  breast 2:00 just adjacent to the nipple. She had ultrasounds x2 last year. She was told there is no change in now to come back yearly.  Musculoskeletal: Normal range of motion.  Lymphadenopathy:    She has no cervical adenopathy.  Neurological: She is alert. She has normal reflexes. No cranial nerve deficit. She exhibits normal muscle tone. Coordination normal.  Skin: Skin is warm and dry.  Psychiatric: She has a normal mood and affect. Her behavior is normal. Judgment and thought content normal.          Assessment & Plan:  Healthy female  Postmenopausal vaginal dryness continue Estrace cream  Fluid retention continue hydrochlorothiazide 25 mg daily  Cystocele with some urinary difficulty and leakage plan continue the hormonal cream begin the exercises twice daily  Overweight again encouraged diet exercise and weight loss  Also recommended Dr. Vonna Kotyk for an eye exam and Dr. Alvester Morin for a dental exam  Grade 1 cystocele  Fibrocystic breast changes

## 2012-09-05 NOTE — Patient Instructions (Signed)
D. the vaginal exercises ,,,,,,,,,,, 50 twice daily  Continue hormonal cream twice weekly  Continue hydrochlorothiazide one daily  Walk 30 minutes daily  Do a thorough breast exam monthly  Return in one year sooner if any problems

## 2012-09-05 NOTE — Addendum Note (Signed)
Addended by: Kern Reap B on: 09/05/2012 02:48 PM   Modules accepted: Orders

## 2013-07-25 ENCOUNTER — Telehealth: Payer: Self-pay | Admitting: Family Medicine

## 2013-07-25 NOTE — Telephone Encounter (Signed)
Okay to work in per Dr Todd 

## 2013-07-25 NOTE — Telephone Encounter (Signed)
Pt needs to have cpe by the end of the year for insurance purposes. Pt states if she cannot see Dr Tawanna Cooler for that cpe, could she get someone else to do? pls advise

## 2013-07-28 NOTE — Telephone Encounter (Signed)
lmom for pt to sch cpx °

## 2013-07-29 ENCOUNTER — Ambulatory Visit (INDEPENDENT_AMBULATORY_CARE_PROVIDER_SITE_OTHER): Payer: Managed Care, Other (non HMO) | Admitting: *Deleted

## 2013-07-29 DIAGNOSIS — Z23 Encounter for immunization: Secondary | ICD-10-CM

## 2013-07-29 NOTE — Telephone Encounter (Signed)
Pt is sch for 10-28-13 cpx

## 2013-10-21 ENCOUNTER — Other Ambulatory Visit (INDEPENDENT_AMBULATORY_CARE_PROVIDER_SITE_OTHER): Payer: Managed Care, Other (non HMO)

## 2013-10-21 DIAGNOSIS — Z0000 Encounter for general adult medical examination without abnormal findings: Secondary | ICD-10-CM

## 2013-10-21 LAB — LIPID PANEL
Cholesterol: 226 mg/dL — ABNORMAL HIGH (ref 0–200)
HDL: 60.8 mg/dL (ref >39.00)
TRIGLYCERIDES: 81 mg/dL (ref 0.0–149.0)
Total CHOL/HDL Ratio: 4
VLDL: 16.2 mg/dL (ref 0.0–40.0)

## 2013-10-21 LAB — CBC WITH DIFFERENTIAL/PLATELET
BASOS ABS: 0 10*3/uL (ref 0.0–0.1)
Basophils Relative: 0.6 % (ref 0.0–3.0)
Eosinophils Absolute: 0.1 10*3/uL (ref 0.0–0.7)
Eosinophils Relative: 1.7 % (ref 0.0–5.0)
HCT: 37.1 % (ref 36.0–46.0)
Hemoglobin: 12.5 g/dL (ref 12.0–15.0)
LYMPHS ABS: 1.6 10*3/uL (ref 0.7–4.0)
LYMPHS PCT: 33.6 % (ref 12.0–46.0)
MCHC: 33.6 g/dL (ref 30.0–36.0)
MCV: 87.1 fl (ref 78.0–100.0)
MONOS PCT: 6.8 % (ref 3.0–12.0)
Monocytes Absolute: 0.3 10*3/uL (ref 0.1–1.0)
NEUTROS PCT: 57.3 % (ref 43.0–77.0)
Neutro Abs: 2.7 10*3/uL (ref 1.4–7.7)
PLATELETS: 292 10*3/uL (ref 150.0–400.0)
RBC: 4.27 Mil/uL (ref 3.87–5.11)
RDW: 15.4 % — AB (ref 11.5–14.6)
WBC: 4.7 10*3/uL (ref 4.5–10.5)

## 2013-10-21 LAB — POCT URINALYSIS DIPSTICK
BILIRUBIN UA: NEGATIVE
GLUCOSE UA: NEGATIVE
KETONES UA: NEGATIVE
LEUKOCYTES UA: NEGATIVE
NITRITE UA: NEGATIVE
Protein, UA: NEGATIVE
Spec Grav, UA: 1.025
Urobilinogen, UA: 0.2
pH, UA: 7

## 2013-10-21 LAB — HEPATIC FUNCTION PANEL
ALT: 16 U/L (ref 0–35)
AST: 15 U/L (ref 0–37)
Albumin: 4.3 g/dL (ref 3.5–5.2)
Alkaline Phosphatase: 51 U/L (ref 39–117)
BILIRUBIN DIRECT: 0 mg/dL (ref 0.0–0.3)
BILIRUBIN TOTAL: 1.1 mg/dL (ref 0.3–1.2)
Total Protein: 7.7 g/dL (ref 6.0–8.3)

## 2013-10-21 LAB — TSH: TSH: 1.95 u[IU]/mL (ref 0.35–5.50)

## 2013-10-21 LAB — BASIC METABOLIC PANEL
BUN: 18 mg/dL (ref 6–23)
CALCIUM: 9.5 mg/dL (ref 8.4–10.5)
CO2: 29 mEq/L (ref 19–32)
CREATININE: 0.6 mg/dL (ref 0.4–1.2)
Chloride: 102 mEq/L (ref 96–112)
GFR: 116.57 mL/min (ref >60.00)
GLUCOSE: 84 mg/dL (ref 70–99)
Potassium: 4.1 mEq/L (ref 3.5–5.1)
Sodium: 140 mEq/L (ref 135–145)

## 2013-10-21 LAB — LDL CHOLESTEROL, DIRECT: LDL DIRECT: 150.4 mg/dL

## 2013-10-28 ENCOUNTER — Ambulatory Visit (INDEPENDENT_AMBULATORY_CARE_PROVIDER_SITE_OTHER): Payer: Managed Care, Other (non HMO) | Admitting: Family Medicine

## 2013-10-28 ENCOUNTER — Encounter: Payer: Self-pay | Admitting: Family Medicine

## 2013-10-28 VITALS — BP 120/88 | Temp 97.9°F | Ht 61.75 in | Wt 196.0 lb

## 2013-10-28 DIAGNOSIS — N952 Postmenopausal atrophic vaginitis: Secondary | ICD-10-CM

## 2013-10-28 DIAGNOSIS — H6120 Impacted cerumen, unspecified ear: Secondary | ICD-10-CM

## 2013-10-28 DIAGNOSIS — Z0000 Encounter for general adult medical examination without abnormal findings: Secondary | ICD-10-CM

## 2013-10-28 DIAGNOSIS — I872 Venous insufficiency (chronic) (peripheral): Secondary | ICD-10-CM

## 2013-10-28 MED ORDER — ESTRADIOL 0.1 MG/GM VA CREA: 2.0000 g | TOPICAL_CREAM | Freq: Every day | VAGINAL | Status: DC

## 2013-10-28 MED ORDER — HYDROCHLOROTHIAZIDE 25 MG PO TABS: 25.0000 mg | ORAL_TABLET | Freq: Every day | ORAL | Status: DC

## 2013-10-28 NOTE — Progress Notes (Signed)
   Subjective:    Patient ID: Ariel Rios, female    DOB: 02-Dec-1955, 58 y.o.   MRN: [id number]  HPI Adriona is a delightful 58 year old married female nonsmoker who comes in today for general physical examination  She is a history of postmenopausal vaginal dryness and uses the Premarin cream sparingly  She has a history of fluid retention weight 196 pounds height 61-3/4 inches and takes hydrochlorothiazide 25 mg daily  She does not get routine eye care or dental care. Referred to doctors Digby and Gloriann Loan  She's not do BSE monthly. She's overdue for annual mammogram. Colonoscopies 51 normal except for some diverticuli  Pap 2014 normal no GYN complaints therefore every 3 years  Social history.......... she works 2 jobs one is at [employer] also had a history of cerumen impactions and hearing loss   Review of Systems  Constitutional: Negative.   HENT: Negative.   Eyes: Negative.   Respiratory: Negative.   Cardiovascular: Negative.   Gastrointestinal: Negative.   Endocrine: Negative.   Genitourinary: Negative.   Musculoskeletal: Negative.   Allergic/Immunologic: Negative.   Neurological: Negative.   Hematological: Negative.   Psychiatric/Behavioral: Negative.        Objective:   Physical Exam  Nursing note and vitals reviewed. Constitutional: She appears well-developed and well-nourished.  HENT:  Head: Normocephalic and atraumatic.  Right Ear: External ear normal.  Left Ear: External ear normal.  Nose: Nose normal.  Mouth/Throat: Oropharynx is clear and moist.  Eyes: EOM are normal. Pupils are equal, round, and reactive to light.  Neck: Normal range of motion. Neck supple. No thyromegaly present.  Cardiovascular: Normal rate, regular rhythm, normal heart sounds and intact distal pulses.  Exam reveals no gallop and no friction rub.   No murmur heard. Pulmonary/Chest: Effort normal and breath sounds normal.  Abdominal: Soft. Bowel sounds are normal. She  exhibits no distension and no mass. There is no tenderness. There is no rebound.  Genitourinary:  Bilateral breast exam normal  Musculoskeletal: Normal range of motion.  Lymphadenopathy:    She has no cervical adenopathy.  Neurological: She is alert. She has normal reflexes. No cranial nerve deficit. She exhibits normal muscle tone. Coordination normal.  Skin: Skin is warm and dry.  Total body skin exam normal she has a garden variety of freckles moles capillaries hemangiomas venous lakes skin tags  Psychiatric: She has a normal mood and affect. Her behavior is normal. Judgment and thought content normal.   Bilateral cerumen impactions       Assessment & Plan:  Healthy female  Postmenopausal vaginal dryness Premarin cream twice weekly  Venous insufficiency hydrochlorothiazide 25 mg daily  Recommend Dr. Bing Plume fried Dr. Gloriann Loan for dental evaluation  Bilateral Scherman impactions removed by suction and irrigation.........[name]

## 2013-10-28 NOTE — Patient Instructions (Signed)
Use small amounts of the hormonal cream twice weekly  Dr. Bing Plume eye exam  Dr. Gloriann Loan dental exam  BSE monthly  Annual mammogram  Followup in 1 year sooner if any problems  2 weeks before your next physical use the ear wax dissolving drops nightly

## 2013-10-28 NOTE — Progress Notes (Signed)
Pre visit review using our clinic review tool, if applicable. No additional management support is needed unless otherwise documented below in the visit note. 

## 2014-02-17 ENCOUNTER — Ambulatory Visit: Payer: Managed Care, Other (non HMO) | Admitting: Family Medicine

## 2014-04-09 ENCOUNTER — Other Ambulatory Visit: Payer: Self-pay | Admitting: *Deleted

## 2014-04-09 DIAGNOSIS — I872 Venous insufficiency (chronic) (peripheral): Secondary | ICD-10-CM

## 2014-04-09 MED ORDER — HYDROCHLOROTHIAZIDE 25 MG PO TABS: 25.0000 mg | ORAL_TABLET | Freq: Every day | ORAL | Status: DC

## 2014-06-08 ENCOUNTER — Ambulatory Visit (INDEPENDENT_AMBULATORY_CARE_PROVIDER_SITE_OTHER): Payer: Managed Care, Other (non HMO) | Admitting: *Deleted

## 2014-06-08 DIAGNOSIS — Z23 Encounter for immunization: Secondary | ICD-10-CM

## 2014-11-10 ENCOUNTER — Other Ambulatory Visit (INDEPENDENT_AMBULATORY_CARE_PROVIDER_SITE_OTHER): Payer: Managed Care, Other (non HMO)

## 2014-11-10 DIAGNOSIS — Z0000 Encounter for general adult medical examination without abnormal findings: Secondary | ICD-10-CM

## 2014-11-10 LAB — CBC WITH DIFFERENTIAL/PLATELET
Basophils Absolute: 0 10*3/uL (ref 0.0–0.1)
Basophils Relative: 0.4 % (ref 0.0–3.0)
EOS ABS: 0.1 10*3/uL (ref 0.0–0.7)
Eosinophils Relative: 1.7 % (ref 0.0–5.0)
HCT: 33.2 % — ABNORMAL LOW (ref 36.0–46.0)
Hemoglobin: 11 g/dL — ABNORMAL LOW (ref 12.0–15.0)
Lymphocytes Relative: 34.6 % (ref 12.0–46.0)
Lymphs Abs: 1.5 10*3/uL (ref 0.7–4.0)
MCHC: 33.2 g/dL (ref 30.0–36.0)
MCV: 88 fl (ref 78.0–100.0)
MONO ABS: 0.3 10*3/uL (ref 0.1–1.0)
Monocytes Relative: 6.1 % (ref 3.0–12.0)
Neutro Abs: 2.6 10*3/uL (ref 1.4–7.7)
Neutrophils Relative %: 57.2 % (ref 43.0–77.0)
Platelets: 320 10*3/uL (ref 150.0–400.0)
RBC: 3.77 Mil/uL — ABNORMAL LOW (ref 3.87–5.11)
RDW: 14.9 % (ref 11.5–15.5)
WBC: 4.5 10*3/uL (ref 4.0–10.5)

## 2014-11-10 LAB — POCT URINALYSIS DIP (MANUAL ENTRY)
BILIRUBIN UA: NEGATIVE
Bilirubin, UA: NEGATIVE
Glucose, UA: NEGATIVE
Leukocytes, UA: NEGATIVE
Nitrite, UA: NEGATIVE
Protein Ur, POC: NEGATIVE
Spec Grav, UA: 1.02
UROBILINOGEN UA: 0.2
pH, UA: 5.5

## 2014-11-10 LAB — HEPATIC FUNCTION PANEL
ALK PHOS: 49 U/L (ref 39–117)
ALT: 15 U/L (ref 0–35)
AST: 15 U/L (ref 0–37)
Albumin: 4.2 g/dL (ref 3.5–5.2)
Bilirubin, Direct: 0.1 mg/dL (ref 0.0–0.3)
Total Bilirubin: 0.8 mg/dL (ref 0.2–1.2)
Total Protein: 7.1 g/dL (ref 6.0–8.3)

## 2014-11-10 LAB — BASIC METABOLIC PANEL
BUN: 18 mg/dL (ref 6–23)
CO2: 30 mEq/L (ref 19–32)
Calcium: 9.5 mg/dL (ref 8.4–10.5)
Chloride: 106 mEq/L (ref 96–112)
Creatinine, Ser: 0.59 mg/dL (ref 0.40–1.20)
GFR: 111.6 mL/min (ref >60.00)
Glucose, Bld: 83 mg/dL (ref 70–99)
POTASSIUM: 3.7 meq/L (ref 3.5–5.1)
SODIUM: 143 meq/L (ref 135–145)

## 2014-11-10 LAB — LIPID PANEL
Cholesterol: 210 mg/dL — ABNORMAL HIGH (ref 0–200)
HDL: 60.1 mg/dL (ref >39.00)
LDL Cholesterol: 131 mg/dL — ABNORMAL HIGH (ref 0–99)
NonHDL: 149.9
Total CHOL/HDL Ratio: 3
Triglycerides: 97 mg/dL (ref 0.0–149.0)
VLDL: 19.4 mg/dL (ref 0.0–40.0)

## 2014-11-10 LAB — TSH: TSH: 2.65 u[IU]/mL (ref 0.35–4.50)

## 2014-11-17 ENCOUNTER — Ambulatory Visit (INDEPENDENT_AMBULATORY_CARE_PROVIDER_SITE_OTHER): Payer: Managed Care, Other (non HMO) | Admitting: Family Medicine

## 2014-11-17 ENCOUNTER — Encounter: Payer: Self-pay | Admitting: Family Medicine

## 2014-11-17 VITALS — BP 110/80 | Temp 98.4°F | Ht 62.0 in | Wt 196.0 lb

## 2014-11-17 DIAGNOSIS — Z23 Encounter for immunization: Secondary | ICD-10-CM

## 2014-11-17 DIAGNOSIS — Z0000 Encounter for general adult medical examination without abnormal findings: Secondary | ICD-10-CM

## 2014-11-17 DIAGNOSIS — D509 Iron deficiency anemia, unspecified: Secondary | ICD-10-CM

## 2014-11-17 DIAGNOSIS — I10 Essential (primary) hypertension: Secondary | ICD-10-CM

## 2014-11-17 DIAGNOSIS — I872 Venous insufficiency (chronic) (peripheral): Secondary | ICD-10-CM

## 2014-11-17 DIAGNOSIS — N952 Postmenopausal atrophic vaginitis: Secondary | ICD-10-CM

## 2014-11-17 MED ORDER — HYDROCHLOROTHIAZIDE 25 MG PO TABS: 25.0000 mg | ORAL_TABLET | Freq: Every day | ORAL | Status: DC

## 2014-11-17 MED ORDER — ESTRADIOL 0.1 MG/GM VA CREA: TOPICAL_CREAM | VAGINAL | Status: DC

## 2014-11-17 NOTE — Progress Notes (Signed)
   Subjective:    Patient ID: Ariel Rios, female    DOB: 1956-05-28, 59 y.o.   MRN: [id number]  HPI Ariel Rios is a 59 year old married female nonsmoker who comes in today for general physical examination because of a history of postmenopausal vaginal dryness venous insufficiency and being overweight  Her weight is steady at 196 pounds. She works at [employer] and walks all day long.  She does not get routine eye care.... Recommended Dr. Bing Plume  She does not get routine dental care..... Recommended Dr. Gloriann Loan  She does BSE monthly and she missed her mammogram. Colonoscopy 2009 normal  Vaccinations up-to-date except she's to a tetanus booster.  LMP around age 74 asymptomatic Pap 2 years ago normal repeat next year   Review of Systems  Constitutional: Negative.   HENT: Negative.   Eyes: Negative.   Respiratory: Negative.   Cardiovascular: Negative.   Gastrointestinal: Negative.   Endocrine: Negative.   Genitourinary: Negative.   Musculoskeletal: Negative.   Skin: Negative.   Allergic/Immunologic: Negative.   Neurological: Negative.   Hematological: Negative.   Psychiatric/Behavioral: Negative.        Objective:   Physical Exam  Constitutional: She appears well-developed and well-nourished.  HENT:  Head: Normocephalic and atraumatic.  Right Ear: External ear normal.  Left Ear: External ear normal.  Nose: Nose normal.  Mouth/Throat: Oropharynx is clear and moist.  Eyes: EOM are normal. Pupils are equal, round, and reactive to light.  Neck: Normal range of motion. Neck supple. No JVD present. No tracheal deviation present. No thyromegaly present.  Cardiovascular: Normal rate, regular rhythm, normal heart sounds and intact distal pulses.  Exam reveals no gallop and no friction rub.   No murmur heard. Pulmonary/Chest: Effort normal and breath sounds normal. No stridor. No respiratory distress. She has no wheezes. She has no rales. She exhibits no tenderness.  Abdominal:  Soft. Bowel sounds are normal. She exhibits no distension and no mass. There is no tenderness. There is no rebound and no guarding.  Genitourinary:  Bilateral breast exam normal  Musculoskeletal: Normal range of motion.  Lymphadenopathy:    She has no cervical adenopathy.  Neurological: She is alert. She has normal reflexes. No cranial nerve deficit. She exhibits normal muscle tone. Coordination normal.  Skin: Skin is warm and dry. No rash noted. No erythema. No pallor.  Total body skin exam normal  Psychiatric: She has a normal mood and affect. Her behavior is normal. Judgment and thought content normal.  Nursing note and vitals reviewed.         Assessment & Plan:  Healthy female  Postmenopausal vaginal dryness.... Continue hormonal cream  Venous insufficiency and overweight...... again talked about diet exercise weight loss and hydrochlorothiazide daily  Hemoglobin 11........... she is a blood donor..... Recommended iron daily follow-up CBC in 3 months.

## 2014-11-17 NOTE — Patient Instructions (Addendum)
Continue current medications  Continue your exercise program  Decrease your sugar intake  Follow-up in one year sooner if any problems  Dr. Gloriann Loan,,,,,, dentist  Dr. Bing Plume,,,,,,, ophthalmologist  Call and schedule your annual mammogram  Follow-up CBC in 3 months  Hemoccults 3 return on Monday  Iron one tablet daily at bedtime

## 2014-11-17 NOTE — Progress Notes (Signed)
Pre visit review using our clinic review tool, if applicable. No additional management support is needed unless otherwise documented below in the visit note. 

## 2014-11-18 ENCOUNTER — Telehealth: Payer: Self-pay | Admitting: Family Medicine

## 2014-11-18 NOTE — Telephone Encounter (Signed)
emmi emailed °

## 2014-11-23 ENCOUNTER — Other Ambulatory Visit (INDEPENDENT_AMBULATORY_CARE_PROVIDER_SITE_OTHER): Payer: Managed Care, Other (non HMO)

## 2014-11-23 ENCOUNTER — Other Ambulatory Visit: Payer: Self-pay | Admitting: Family Medicine

## 2014-11-23 DIAGNOSIS — K921 Melena: Secondary | ICD-10-CM

## 2014-11-23 DIAGNOSIS — R319 Hematuria, unspecified: Secondary | ICD-10-CM

## 2014-11-23 LAB — POC HEMOCCULT BLD/STL (HOME/3-CARD/SCREEN)
CARD #2 DATE: NEGATIVE
Card #1 Date: NEGATIVE
Card #2 Fecal Occult Blod, POC: NEGATIVE
Card #3 Date: NEGATIVE
FECAL OCCULT BLD: NEGATIVE
Fecal Occult Blood, POC: NEGATIVE

## 2015-01-19 ENCOUNTER — Other Ambulatory Visit (INDEPENDENT_AMBULATORY_CARE_PROVIDER_SITE_OTHER): Payer: Managed Care, Other (non HMO)

## 2015-01-19 DIAGNOSIS — D500 Iron deficiency anemia secondary to blood loss (chronic): Secondary | ICD-10-CM | POA: Diagnosis not present

## 2015-01-19 LAB — CBC WITH DIFFERENTIAL/PLATELET
Basophils Absolute: 0 10*3/uL (ref 0.0–0.1)
Basophils Relative: 0.6 % (ref 0.0–3.0)
EOS ABS: 0.2 10*3/uL (ref 0.0–0.7)
EOS PCT: 3.3 % (ref 0.0–5.0)
HCT: 35.1 % — ABNORMAL LOW (ref 36.0–46.0)
Hemoglobin: 11.8 g/dL — ABNORMAL LOW (ref 12.0–15.0)
LYMPHS ABS: 1.6 10*3/uL (ref 0.7–4.0)
LYMPHS PCT: 34.1 % (ref 12.0–46.0)
MCHC: 33.7 g/dL (ref 30.0–36.0)
MCV: 87.5 fl (ref 78.0–100.0)
Monocytes Absolute: 0.3 10*3/uL (ref 0.1–1.0)
Monocytes Relative: 6.5 % (ref 3.0–12.0)
NEUTROS ABS: 2.6 10*3/uL (ref 1.4–7.7)
NEUTROS PCT: 55.5 % (ref 43.0–77.0)
Platelets: 284 10*3/uL (ref 150.0–400.0)
RBC: 4.01 Mil/uL (ref 3.87–5.11)
RDW: 13.9 % (ref 11.5–15.5)
WBC: 4.6 10*3/uL (ref 4.0–10.5)

## 2015-02-05 ENCOUNTER — Telehealth: Payer: Self-pay | Admitting: *Deleted

## 2015-02-05 NOTE — Telephone Encounter (Signed)
Left message on machine for patient to call back with resent mammogram result.

## 2015-02-09 ENCOUNTER — Other Ambulatory Visit: Payer: Self-pay | Admitting: Family Medicine

## 2015-02-09 DIAGNOSIS — N63 Unspecified lump in unspecified breast: Secondary | ICD-10-CM

## 2015-02-09 DIAGNOSIS — Z1231 Encounter for screening mammogram for malignant neoplasm of breast: Secondary | ICD-10-CM

## 2015-02-16 ENCOUNTER — Other Ambulatory Visit: Payer: Managed Care, Other (non HMO)

## 2015-02-16 ENCOUNTER — Ambulatory Visit
Admission: RE | Admit: 2015-02-16 | Discharge: 2015-02-16 | Disposition: A | Discharge status: Home / Self Care | Payer: Managed Care, Other (non HMO) | Source: Ambulatory Visit | Attending: Family Medicine | Admitting: Family Medicine

## 2015-02-16 DIAGNOSIS — N63 Unspecified lump in unspecified breast: Secondary | ICD-10-CM

## 2015-03-23 ENCOUNTER — Other Ambulatory Visit (INDEPENDENT_AMBULATORY_CARE_PROVIDER_SITE_OTHER): Payer: Managed Care, Other (non HMO)

## 2015-03-23 DIAGNOSIS — D649 Anemia, unspecified: Secondary | ICD-10-CM

## 2015-03-23 LAB — CBC WITH DIFFERENTIAL/PLATELET
Basophils Absolute: 0 10*3/uL (ref 0.0–0.1)
Basophils Relative: 0.6 % (ref 0.0–3.0)
EOS ABS: 0.1 10*3/uL (ref 0.0–0.7)
EOS PCT: 2.4 % (ref 0.0–5.0)
HEMATOCRIT: 37.8 % (ref 36.0–46.0)
HEMOGLOBIN: 12.6 g/dL (ref 12.0–15.0)
LYMPHS ABS: 1.7 10*3/uL (ref 0.7–4.0)
Lymphocytes Relative: 32.9 % (ref 12.0–46.0)
MCHC: 33.3 g/dL (ref 30.0–36.0)
MCV: 88.1 fl (ref 78.0–100.0)
MONO ABS: 0.3 10*3/uL (ref 0.1–1.0)
Monocytes Relative: 5.8 % (ref 3.0–12.0)
NEUTROS ABS: 3 10*3/uL (ref 1.4–7.7)
Neutrophils Relative %: 58.3 % (ref 43.0–77.0)
Platelets: 310 10*3/uL (ref 150.0–400.0)
RBC: 4.29 Mil/uL (ref 3.87–5.11)
RDW: 14.4 % (ref 11.5–15.5)
WBC: 5.2 10*3/uL (ref 4.0–10.5)

## 2015-05-18 ENCOUNTER — Encounter: Payer: Self-pay | Admitting: Gastroenterology

## 2015-06-21 ENCOUNTER — Ambulatory Visit (INDEPENDENT_AMBULATORY_CARE_PROVIDER_SITE_OTHER): Payer: Managed Care, Other (non HMO) | Admitting: *Deleted

## 2015-06-21 DIAGNOSIS — Z23 Encounter for immunization: Secondary | ICD-10-CM

## 2015-07-08 ENCOUNTER — Other Ambulatory Visit: Payer: Self-pay | Admitting: Family Medicine

## 2015-10-06 ENCOUNTER — Telehealth: Payer: Self-pay | Admitting: Family Medicine

## 2015-10-06 NOTE — Telephone Encounter (Signed)
Pt has cpx labs sch for 02-15-16. Pt hair is thinning and would like to have additional labs added to cpx labs if needed.

## 2015-10-07 NOTE — Telephone Encounter (Signed)
tsh is part of the CPX labs.  Patient is aware.

## 2016-01-12 ENCOUNTER — Other Ambulatory Visit: Payer: Self-pay | Admitting: Family Medicine

## 2016-01-24 ENCOUNTER — Ambulatory Visit (INDEPENDENT_AMBULATORY_CARE_PROVIDER_SITE_OTHER): Payer: Managed Care, Other (non HMO) | Admitting: Family Medicine

## 2016-01-24 ENCOUNTER — Encounter: Payer: Self-pay | Admitting: Family Medicine

## 2016-01-24 VITALS — BP 124/82 | HR 71 | Temp 98.2°F | Ht 62.0 in | Wt 196.0 lb

## 2016-01-24 DIAGNOSIS — E785 Hyperlipidemia, unspecified: Secondary | ICD-10-CM | POA: Insufficient documentation

## 2016-01-24 DIAGNOSIS — R600 Localized edema: Secondary | ICD-10-CM | POA: Diagnosis not present

## 2016-01-24 DIAGNOSIS — Z79899 Other long term (current) drug therapy: Secondary | ICD-10-CM

## 2016-01-24 DIAGNOSIS — E7800 Pure hypercholesterolemia, unspecified: Secondary | ICD-10-CM

## 2016-01-24 DIAGNOSIS — L659 Nonscarring hair loss, unspecified: Secondary | ICD-10-CM | POA: Insufficient documentation

## 2016-01-24 DIAGNOSIS — Z131 Encounter for screening for diabetes mellitus: Secondary | ICD-10-CM

## 2016-01-24 DIAGNOSIS — D509 Iron deficiency anemia, unspecified: Secondary | ICD-10-CM

## 2016-01-24 LAB — CBC
HEMATOCRIT: 37.1 % (ref 36.0–46.0)
Hemoglobin: 12.5 g/dL (ref 12.0–15.0)
MCHC: 33.8 g/dL (ref 30.0–36.0)
MCV: 87.3 fl (ref 78.0–100.0)
Platelets: 311 10*3/uL (ref 150.0–400.0)
RBC: 4.25 Mil/uL (ref 3.87–5.11)
RDW: 13.5 % (ref 11.5–15.5)
WBC: 5.6 10*3/uL (ref 4.0–10.5)

## 2016-01-24 LAB — FERRITIN: Ferritin: 42.4 ng/mL (ref 10.0–291.0)

## 2016-01-24 NOTE — Progress Notes (Signed)
HPI:  [name] is a 60 y.o.female , who is here today to establish care with me.  Former PCP Dr Sherren Mocha and physical: 11/2014. She usually follows once per year and she is due for her gyn examination as well.She denies any hx of abnormal pap smear or vaginal bleeding/discharge.  She takes HCTZ 25 mg daily for LE edema, denies any Hx of HTN.Edema is usually worse at the end of the day and she can tell when she does not take diuretic med, peri ankle edema. No leg pain or erythema associated, also Hx of vein disease.  Has the following chronic problems that require follow up and concerns today: 6 months of hair loss, temporal areas mainly, she has not noted alopecic areas. No frequent chemical hair treatment except for coloring her hair.  No oral lesions, associated arthralgias, new medications, increased stress, and no anxiety.  For a couple months she has noted some raised erythematous  lesions on  forearms associated with heat/sweating, usually at work and when wearing long sleeve coat (not new exposure).   She has history of anemia, she is not taking Iron, stopped about 3 months ago because it was causing constipation. No pica. She had TSH checked in 10/2014.   Lab Results  Component Value Date   TSH 2.65 11/10/2014    CBC Latest Ref Rng  03/23/2015 01/19/2015  WBC 4.0 - 10.5 K/uL  5.2 4.6  Hemoglobin 12.0 - 15.0 g/dL  12.6 11.8(L)  Hematocrit 36.0 - 46.0 %  37.8 35.1(L)  Platelets 150.0 - 400.0 K/uL  310.0 284.0    Biotin OTC started a few weeks ago, has not taken it daily because work schedule.  FHx negative for alopecia.    REVIEW OF SYSTEMS:  General: Negative for fever, fatigue, abnormal weight loss, or changes in appetite.  Eyes: Negative for conjunctival erythema, or visual changes.  ENT: Negative for nasal congestion, sinus pain, epistaxis. Negative for oral lesions, odynophagia, dysphagia.  Neck: negative for swollen glands or masses.  Cardiac:  Negative for chest pain, exertional dyspnea, irregular HR, claudication, cold extremities, or worsening edema.  Respiratory: Negative for cough, dyspnea, wheezing, or hemoptysis.  Abdomen: Negative for abdominal pain, changes in bowel habits, blood in stool or melena, nausea, or vomiting.  GU: Negative for dysuria, urinary frequency or urgency, gross hematuria.  MS: No arthralgias, joint erythema or edema, joint stiffness, or limitation of ROM.  Neurologic: No confusion,  focal weakness, numbness or tingling, frequent/severe headaches, or tremor.  Psychiatric: No depression, anxiety, or sleep disorder.  Skin: Negative for rash, ulcers, or skin color changes.     Past Medical History  Diagnosis Date  . Venous insufficiency   . Migraine     History reviewed. No pertinent past surgical history.  Family History  Problem Relation Age of Onset  . Diabetes Mother   . AVM Father     Social History   Social History  . Marital Status: Married    Spouse Name: N/A  . Number of Children: N/A  . Years of Education: N/A   Social History Main Topics  . Smoking status: Never Smoker   . Smokeless tobacco: None  . Alcohol Use: No  . Drug Use: No  . Sexual Activity: Not Asked   Other Topics Concern  . None   Social History Narrative    Current Outpatient Prescriptions on File Prior to Visit  Medication Sig Dispense Refill  . hydrochlorothiazide (HYDRODIURIL) 25 MG tablet  TAKE 1 TABLET BY MOUTH EVERY DAY 90 tablet 1  . Multiple Vitamin (MULTIVITAMIN) tablet Take 1 tablet by mouth daily.      [name] estradiol (ESTRACE) 0.1 MG/GM vaginal cream Uses directed (Patient not taking: Reported on 01/24/2016) 42.5 g 11   No current facility-administered medications on file prior to visit.     EXAM:  Filed Vitals:   01/24/16 1348  BP: 124/82  Pulse: 71  Temp: 98.2 F (36.8 C)    Body mass index is 35.84 kg/(m^2).   GENERAL: vitals reviewed and listed above, well developed,  appears well hydrated and in no acute distress.  ENT: atraumatic, conjunttiva clear, no obvious abnormalities on inspection of external nose.  NECK: no obvious masses on inspection. No lymphadenopathy or thyromegaly appreciated.  LUNGS: clear to auscultation bilaterally, no wheezes, rales or rhonchi, good air movement  CV: Regular rate and rhythm, no murmurs appreciated, no peripheral edema. A few varicose vains LE bilateral, DP pulses present bilateral.  ABDOMEN: Soft, no tender, no masses or hepatomegaly appreciated.  MS: moves all extremities without noticeable deformity, no signs of synovitis.  NEURO: Alert and oriented x 3, no focal deficit appreciated, normal gait.  PSYCH: pleasant and cooperative, no obvious depression or anxiety.  SKIN: No rash or erythematous lesions appreciated. No alopecia areas or scalp lesions, gray hair on areas of hair loss concern.   ASSESSMENT AND PLAN:  Discussed the following assessment and plan:   Hair loss disorder   It seems to me that gray hair exposes scalp more, hair pull test negative, no alopecia appreciated. Possible causes discussed. A few labs done today. Rogaine OTC is an option. Further recommendations will be given according to lab results. Spironolactone could be considered, some side effects discussed.   - Plan: ANA, Ferritin, COMPLETE METABOLIC PANEL WITH GFR  Bilateral lower extremity edema   Mild and seems well controlled with HCTZ. Recommended considering decreasing frequency from daily to q 2-3 days. LE elevation as needed. Compression stocking might also help, venous etiology.   - Plan: COMPLETE METABOLIC PANEL WITH GFR  Anemia, iron deficiency   No ne recommendations except for increasing iron rich foods, some side effects of Fe Sulfate reviewed. I will follow labs done today and will give further recommendations accordingly. Colonoscopy 12/2007 negative, 10 years f/u recommended.  - Plan: Ferritin, CBC (no  diff)  Encounter for long-term (current) use of medications   Some side effects of thiazide diuretic discussed.  - Plan: COMPLETE METABOLIC PANEL WITH GFR  Lab Results  Component Value Date   WBC 5.6 01/24/2016   HGB 12.5 01/24/2016   HCT 37.1 01/24/2016   MCV 87.3 01/24/2016   PLT 311.0 01/24/2016     -We reviewed the PMH, PSH, FH, SH, Meds and Allergies.  -We addressed current concerns per orders and patient instructions.  -CPE/gyn exam will be scheduled, labs 2-3 days before visit (FG and FLP + u/a).   -Patient advised to return or notify a doctor immediately if symptoms worsen or persist or new concerns arise.      [name]. [name], MD  [facility]. [place] office.

## 2016-01-24 NOTE — Patient Instructions (Signed)
A few things to remember from today's visit:   1. Bilateral lower extremity edema  - COMPLETE METABOLIC PANEL WITH GFR  2. Hair loss disorder  - ANA - Ferritin - COMPLETE METABOLIC PANEL WITH GFR  3. Anemia, iron deficiency  - Ferritin - CBC (no diff)  4. Encounter for long-term (current) use of medications  - COMPLETE METABOLIC PANEL WITH GFR      If you sign-up for My chart, you can communicate easier with [place] in case you have any question or concern.

## 2016-01-24 NOTE — Progress Notes (Signed)
Pre visit review using our clinic review tool, if applicable. No additional management support is needed unless otherwise documented below in the visit note. 

## 2016-01-25 LAB — COMPLETE METABOLIC PANEL WITH GFR
ALK PHOS: 55 U/L (ref 33–130)
ALT: 10 U/L (ref 6–29)
AST: 13 U/L (ref 10–35)
Albumin: 4.5 g/dL (ref 3.6–5.1)
BUN: 15 mg/dL (ref 7–25)
CO2: 28 mmol/L (ref 20–31)
Calcium: 9.6 mg/dL (ref 8.6–10.4)
Chloride: 101 mmol/L (ref 98–110)
Creat: 0.56 mg/dL (ref 0.50–1.05)
GFR, Est African American: >89 mL/min (ref >=60)
GFR, Est Non African American: >89 mL/min (ref >=60)
Glucose, Bld: 95 mg/dL (ref 65–99)
Potassium: 3.5 mmol/L (ref 3.5–5.3)
Sodium: 140 mmol/L (ref 135–146)
Total Bilirubin: 0.9 mg/dL (ref 0.2–1.2)
Total Protein: 6.9 g/dL (ref 6.1–8.1)

## 2016-01-25 LAB — ANA: Anti Nuclear Antibody(ANA): NEGATIVE

## 2016-02-15 ENCOUNTER — Other Ambulatory Visit: Payer: Managed Care, Other (non HMO)

## 2016-02-22 ENCOUNTER — Encounter: Payer: Managed Care, Other (non HMO) | Admitting: Family Medicine

## 2016-03-27 ENCOUNTER — Ambulatory Visit (INDEPENDENT_AMBULATORY_CARE_PROVIDER_SITE_OTHER)
Admission: RE | Admit: 2016-03-27 | Discharge: 2016-03-27 | Disposition: A | Discharge status: Home / Self Care | Payer: Managed Care, Other (non HMO) | Source: Ambulatory Visit | Attending: Family Medicine | Admitting: Family Medicine

## 2016-03-27 ENCOUNTER — Encounter: Payer: Self-pay | Admitting: Family Medicine

## 2016-03-27 ENCOUNTER — Ambulatory Visit (INDEPENDENT_AMBULATORY_CARE_PROVIDER_SITE_OTHER): Payer: Managed Care, Other (non HMO) | Admitting: Family Medicine

## 2016-03-27 VITALS — BP 122/80 | HR 71 | Temp 98.2°F | Resp 12 | Ht 62.0 in | Wt 197.0 lb

## 2016-03-27 DIAGNOSIS — M25562 Pain in left knee: Secondary | ICD-10-CM | POA: Diagnosis not present

## 2016-03-27 NOTE — Patient Instructions (Signed)
A few things to remember from today's visit:   1. Left knee pain  Osteoarthritis?  Avoid activities that aggravate pain. [place] may help. Fall precautions.  PT may help. X ray ordered today.  Ortho could also be considered if not better.  Wt loss may help.  - DG Knee Complete 4 Views Left; Future      If you sign-up for My chart, you can communicate easier with [place] in case you have any question or concern.

## 2016-03-27 NOTE — Progress Notes (Signed)
HPI:  ACUTE VISIT:   Chief Complaint  Patient presents with  . Follow-up    left knee/calf pain not better. Pt said when she would squat, it would feel like something was behind her knee cap, but it didn't hurt. Then it moved towards the side and front of her calf. Pt did use a knee brace like was suggested. Behind her left calf, it feels different than the right calf.     Ariel Rios is a 60 y.o. female, who is here today complaining of 2 months of intermittent left knee pain.  Left knee intermittent pain, sharp, 6/10, no limitation of range of motion. Pain started on posterior aspect of knee, feeling like a "wedge" on area, then lateral pain; mild initially and progressively got worse, so she decided to seek medical attention.  She was seen in a local acute care facility, according to patient, imaging was not done and naproxen was recommended. She didn't fill the prescription. I knee brace was also recommended, she has been wearing it and he seems to help with pain.  She denies any history of trauma. Knee pain is slowly getting better but not resolved. Pain is exacerbated by prolonged walking and going down stairs. She hasn't noticed any swelling or erythema, she has tight sensation on posterior aspect of left knee. She denies any prior history of knee pain.     Review of Systems  Constitutional: Negative for fever, appetite change, fatigue and unexpected weight change.  Respiratory: Negative for cough, shortness of breath and wheezing.   Cardiovascular: Negative for chest pain, palpitations and leg swelling.  Musculoskeletal: Positive for arthralgias and gait problem. Negative for myalgias.  Skin: Negative for color change and rash.  Neurological: Negative for syncope, weakness and numbness.  Psychiatric/Behavioral: Negative for confusion. The patient is nervous/anxious.       Current Outpatient Prescriptions on File Prior to Visit  Medication Sig Dispense  Refill  . hydrochlorothiazide (HYDRODIURIL) 25 MG tablet TAKE 1 TABLET BY MOUTH EVERY DAY 90 tablet 1  . Multiple Vitamin (MULTIVITAMIN) tablet Take 1 tablet by mouth daily.      [name] estradiol (ESTRACE) 0.1 MG/GM vaginal cream Uses directed (Patient not taking: Reported on 01/24/2016) 42.5 g 11   No current facility-administered medications on file prior to visit.     Past Medical History  Diagnosis Date  . Venous insufficiency   . Migraine    No Known Allergies  Social History   Social History  . Marital Status: Married    Spouse Name: N/A  . Number of Children: N/A  . Years of Education: N/A   Social History Main Topics  . Smoking status: Never Smoker   . Smokeless tobacco: None  . Alcohol Use: No  . Drug Use: No  . Sexual Activity: Not Asked   Other Topics Concern  . None   Social History Narrative    Filed Vitals:   03/27/16 1432  BP: 122/80  Pulse: 71  Temp: 98.2 F (36.8 C)  Resp: 12   Body mass index is 36.02 kg/(m^2).      Physical Exam  Constitutional: She is oriented to person, place, and time. She appears well-developed. She does not appear ill. No distress.  HENT:  Head: Atraumatic.  Eyes: Conjunctivae are normal.  Cardiovascular:  Pulses:      Dorsalis pedis pulses are 2+ on the right side, and 2+ on the left side.  Respiratory: Effort normal and breath sounds normal.  No respiratory distress.  Musculoskeletal: She exhibits no edema or tenderness.  Left knee: on inspection no effusion, erythema, or deformities. Valgus and varus stress normal,  anterior and posterior drawer test negative. Crepitus knee with ROM bilateral, no pain elicited, otherwise normal ROM. Left posterior knee: mildly prominent poplitea fossa, no tenderness.    Neurological: She is alert and oriented to person, place, and time. She has normal strength. Coordination and gait normal.  Stable gait, no assistance.  Skin: Skin is warm. No erythema.  Psychiatric: Her speech is  normal. Her mood appears anxious.  Well groomed, good eye contact.      ASSESSMENT AND PLAN:     Ariel Rios was seen today for follow-up.  Diagnoses and all orders for this visit:  Left knee pain -     DG Knee Complete 4 Views Left; Future   We discussed possible causes, seems to be suggestive of OA,? Baker's cyst. We discussed treatment options in general:PT,ortho, knee injections among some. Steroid knee injection may help but temporarily and have some side effects. Hyaluronic acid may be a good option. We may consider u/s of knee to evaluate for Baker's cyst but management may not be significant different from OA in general. She prefers to hold on PT or ortho. Avoid activities that may exacerbate pain, if possible. F/U as needed.        -Ariel Rios advised to return or notify a doctor immediately if symptoms worsen or persist or new concerns arise.       Ariel G. [name], MD  [facility]. [place] office.

## 2016-03-27 NOTE — Progress Notes (Signed)
Pre visit review using our clinic review tool, if applicable. No additional management support is needed unless otherwise documented below in the visit note. 

## 2016-03-29 ENCOUNTER — Other Ambulatory Visit: Payer: Self-pay | Admitting: Family Medicine

## 2016-03-29 DIAGNOSIS — M25562 Pain in left knee: Secondary | ICD-10-CM

## 2016-03-29 MED ORDER — NAPROXEN 500 MG PO TABS: 500.0000 mg | ORAL_TABLET | Freq: Two times a day (BID) | ORAL | Status: DC

## 2016-04-03 ENCOUNTER — Ambulatory Visit
Admission: RE | Admit: 2016-04-03 | Discharge: 2016-04-03 | Disposition: A | Discharge status: Home / Self Care | Payer: Managed Care, Other (non HMO) | Source: Ambulatory Visit | Attending: Family Medicine | Admitting: Family Medicine

## 2016-04-03 ENCOUNTER — Other Ambulatory Visit: Payer: Self-pay | Admitting: Family Medicine

## 2016-04-03 DIAGNOSIS — Z1231 Encounter for screening mammogram for malignant neoplasm of breast: Secondary | ICD-10-CM

## 2016-04-07 ENCOUNTER — Other Ambulatory Visit (INDEPENDENT_AMBULATORY_CARE_PROVIDER_SITE_OTHER): Payer: Managed Care, Other (non HMO)

## 2016-04-07 DIAGNOSIS — Z131 Encounter for screening for diabetes mellitus: Secondary | ICD-10-CM

## 2016-04-07 DIAGNOSIS — Z0000 Encounter for general adult medical examination without abnormal findings: Secondary | ICD-10-CM

## 2016-04-07 DIAGNOSIS — E7800 Pure hypercholesterolemia, unspecified: Secondary | ICD-10-CM | POA: Diagnosis not present

## 2016-04-07 DIAGNOSIS — D509 Iron deficiency anemia, unspecified: Secondary | ICD-10-CM

## 2016-04-07 LAB — LIPID PANEL
Cholesterol: 206 mg/dL — ABNORMAL HIGH (ref 0–200)
HDL: 51.8 mg/dL (ref >39.00)
LDL Cholesterol: 135 mg/dL — ABNORMAL HIGH (ref 0–99)
NONHDL: 154.3
Total CHOL/HDL Ratio: 4
Triglycerides: 99 mg/dL (ref 0.0–149.0)
VLDL: 19.8 mg/dL (ref 0.0–40.0)

## 2016-04-07 LAB — TSH: TSH: 1.48 u[IU]/mL (ref 0.35–4.50)

## 2016-04-07 LAB — POC URINALSYSI DIPSTICK (AUTOMATED)
Bilirubin, UA: NEGATIVE
GLUCOSE UA: NEGATIVE
Ketones, UA: NEGATIVE
Nitrite, UA: NEGATIVE
Spec Grav, UA: 1.025
UROBILINOGEN UA: 1
pH, UA: 6.5

## 2016-04-07 LAB — CBC WITH DIFFERENTIAL/PLATELET
BASOS ABS: 0 10*3/uL (ref 0.0–0.1)
Basophils Relative: 0.7 % (ref 0.0–3.0)
Eosinophils Absolute: 0.1 10*3/uL (ref 0.0–0.7)
Eosinophils Relative: 3 % (ref 0.0–5.0)
HEMATOCRIT: 35.7 % — AB (ref 36.0–46.0)
HEMOGLOBIN: 11.9 g/dL — AB (ref 12.0–15.0)
Lymphocytes Relative: 33.9 % (ref 12.0–46.0)
Lymphs Abs: 1.3 10*3/uL (ref 0.7–4.0)
MCHC: 33.4 g/dL (ref 30.0–36.0)
MCV: 86.5 fl (ref 78.0–100.0)
Monocytes Absolute: 0.3 10*3/uL (ref 0.1–1.0)
Monocytes Relative: 8.4 % (ref 3.0–12.0)
Neutro Abs: 2.1 10*3/uL (ref 1.4–7.7)
Neutrophils Relative %: 54 % (ref 43.0–77.0)
PLATELETS: 341 10*3/uL (ref 150.0–400.0)
RBC: 4.12 Mil/uL (ref 3.87–5.11)
RDW: 13.4 % (ref 11.5–15.5)
WBC: 3.8 10*3/uL — AB (ref 4.0–10.5)

## 2016-04-07 LAB — HEPATIC FUNCTION PANEL
ALK PHOS: 52 U/L (ref 39–117)
ALT: 10 U/L (ref 0–35)
AST: 13 U/L (ref 0–37)
Albumin: 4.1 g/dL (ref 3.5–5.2)
BILIRUBIN DIRECT: 0.2 mg/dL (ref 0.0–0.3)
TOTAL PROTEIN: 7.2 g/dL (ref 6.0–8.3)
Total Bilirubin: 0.9 mg/dL (ref 0.2–1.2)

## 2016-04-07 LAB — BASIC METABOLIC PANEL
BUN: 19 mg/dL (ref 6–23)
CO2: 28 mEq/L (ref 19–32)
CREATININE: 0.57 mg/dL (ref 0.40–1.20)
Calcium: 9.4 mg/dL (ref 8.4–10.5)
Chloride: 103 mEq/L (ref 96–112)
GFR: 115.56 mL/min (ref >60.00)
Glucose, Bld: 81 mg/dL (ref 70–99)
Potassium: 3.1 mEq/L — ABNORMAL LOW (ref 3.5–5.1)
SODIUM: 141 meq/L (ref 135–145)

## 2016-04-07 LAB — HEMOGLOBIN A1C: Hgb A1c MFr Bld: 5.6 % (ref 4.6–6.5)

## 2016-04-17 ENCOUNTER — Encounter: Payer: Self-pay | Admitting: Family Medicine

## 2016-04-17 ENCOUNTER — Ambulatory Visit (INDEPENDENT_AMBULATORY_CARE_PROVIDER_SITE_OTHER): Payer: Managed Care, Other (non HMO) | Admitting: Family Medicine

## 2016-04-17 ENCOUNTER — Telehealth: Payer: Self-pay | Admitting: Family Medicine

## 2016-04-17 VITALS — BP 126/80 | HR 68 | Temp 98.1°F | Ht 62.0 in | Wt 197.1 lb

## 2016-04-17 DIAGNOSIS — R3121 Asymptomatic microscopic hematuria: Secondary | ICD-10-CM

## 2016-04-17 DIAGNOSIS — Z0000 Encounter for general adult medical examination without abnormal findings: Secondary | ICD-10-CM

## 2016-04-17 DIAGNOSIS — E7800 Pure hypercholesterolemia, unspecified: Secondary | ICD-10-CM

## 2016-04-17 DIAGNOSIS — Z124 Encounter for screening for malignant neoplasm of cervix: Secondary | ICD-10-CM

## 2016-04-17 DIAGNOSIS — E876 Hypokalemia: Secondary | ICD-10-CM

## 2016-04-17 NOTE — Progress Notes (Signed)
HPI:   [name] is a 60 y.o. female, who is here today for her routine physical.    She does not exercise regularly and does try to follow a healthy diet.  She lives with husband.   Chronic medical problems: venous disease.  Pap smear 08/2012. Hx of abnormal pap smears: denies. Hx of STD's: no Colonoscopy 12/2017, due in 2019. Mammogram: 03/2016, Birads 1. FHx for gynecologic or colon cancer negative.  She has no concerns today.   Recently she was seen for left knee pain, she already followed with orthopedist. According to patient, knee MRI showed meniscal tear and stress fracture of left knee, PT recommended. In general pain has improved, 2/10, no limitation of ROM. Next f/u appt next week.   -She is not sure about prior Hep C screening, no Hx of blood transfusion, IV drug use, or abnormal liver function test. She had lab work recently, orders were placed by former PCP.   Lab Results  Component Value Date   CHOL 206 (H) 04/07/2016   HDL 51.80 04/07/2016   LDLCALC 135 (H) 04/07/2016   LDLDIRECT 150.4 10/21/2013   TRIG 99.0 04/07/2016   CHOLHDL 4 04/07/2016     Chemistry      Component Value Date/Time   NA 141 04/07/2016 0826   K 3.1 (L) 04/07/2016 0826   CL 103 04/07/2016 0826   CO2 28 04/07/2016 0826   BUN 19 04/07/2016 0826   CREATININE 0.57 04/07/2016 0826   CREATININE 0.56 01/24/2016 1458      Component Value Date/Time   CALCIUM 9.4 04/07/2016 0826   ALKPHOS 52 04/07/2016 0826   AST 13 04/07/2016 0826   ALT 10 04/07/2016 0826   BILITOT 0.9 04/07/2016 0826     She is currently on hydrochlorothiazide to treat lower extremity edema, she takes medication daily.  Urinalysis +2 blood, She denies any gross hematuria, tobacco use, or urinary symptoms. Reviewing prior urine labs I noted trace and +1 blood.    Review of Systems  Constitutional: Negative for appetite change, fatigue, fever and unexpected weight change.  HENT: Negative  for dental problem, hearing loss, nosebleeds, trouble swallowing and voice change.   Eyes: Negative for photophobia and visual disturbance.  Respiratory: Negative for cough, shortness of breath and wheezing.   Cardiovascular: Negative for chest pain and leg swelling.  Gastrointestinal: Negative for abdominal pain, blood in stool, nausea and vomiting.       No changes in bowel habits.  Endocrine: Negative for cold intolerance and heat intolerance.  Genitourinary: Negative for decreased urine volume, dysuria, hematuria, menstrual problem, vaginal bleeding and vaginal discharge.       No breast tenderness or nipple discharge.  Musculoskeletal: Positive for arthralgias. Negative for back pain, gait problem and joint swelling.  Skin: Negative for rash.  Neurological: Negative for syncope, weakness, numbness and headaches.  Hematological: Negative for adenopathy. Does not bruise/bleed easily.  Psychiatric/Behavioral: Negative for confusion and sleep disturbance. The patient is not nervous/anxious.   All other systems reviewed and are negative.     Current Outpatient Prescriptions on File Prior to Visit  Medication Sig Dispense Refill  . hydrochlorothiazide (HYDRODIURIL) 25 MG tablet TAKE 1 TABLET BY MOUTH EVERY DAY 90 tablet 1  . Multiple Vitamin (MULTIVITAMIN) tablet Take 1 tablet by mouth daily.       No current facility-administered medications on file prior to visit.      Past Medical History:  Diagnosis Date  . Migraine   .  Venous insufficiency     No Known Allergies  Family History  Problem Relation Age of Onset  . Diabetes Mother   . AVM Father     Social History   Social History  . Marital status: Married    Spouse name: N/A  . Number of children: N/A  . Years of education: N/A   Social History Main Topics  . Smoking status: Never Smoker  . Smokeless tobacco: None  . Alcohol use No  . Drug use: No  . Sexual activity: Not Asked   Other Topics Concern  . None    Social History Narrative  . None     Vitals:   04/17/16 1349  BP: 126/80  Pulse: 68  Temp: 98.1 F (36.7 C)   Body mass index is 36.05 kg/m.    Wt Readings from Last 3 Encounters:  04/17/16 197 lb 2 oz (89.4 kg)  03/27/16 197 lb (89.4 kg)  01/24/16 196 lb (88.9 kg)       Physical Exam  Constitutional: She is oriented to person, place, and time. She appears well-developed. No distress.  HENT:  Head: Atraumatic.  Right Ear: Hearing, tympanic membrane, external ear and ear canal normal.  Left Ear: Hearing, tympanic membrane, external ear and ear canal normal.  Mouth/Throat: Uvula is midline, oropharynx is clear and moist and mucous membranes are normal.  Eyes: Conjunctivae and EOM are normal. Pupils are equal, round, and reactive to light.  Cardiovascular: Normal rate and regular rhythm.   No murmur heard. Pulses:      Dorsalis pedis pulses are 2+ on the right side, and 2+ on the left side.       Posterior tibial pulses are 2+ on the right side, and 2+ on the left side.  Respiratory: Effort normal and breath sounds normal. No respiratory distress.  GI: Soft. She exhibits no mass. There is no tenderness.  Genitourinary: No breast swelling, tenderness or discharge. Pelvic exam was performed with patient supine. There is no rash, tenderness or lesion on the right labia. There is no rash, tenderness or lesion on the left labia. Uterus is not enlarged and not tender. Cervix exhibits no motion tenderness, no discharge and no friability. Right adnexum displays no mass, no tenderness and no fullness. Left adnexum displays no mass, no tenderness and no fullness. No erythema or bleeding in the vagina. No vaginal discharge found.  Genitourinary Comments: Vaginal mucosa dry. Mild cystocele.  Musculoskeletal: She exhibits no edema.  No major deformity or sing of synovitis appreciated.  Lymphadenopathy:    She has no cervical adenopathy.       Right: No supraclavicular adenopathy  present.       Left: No supraclavicular adenopathy present.  Neurological: She is alert and oriented to person, place, and time. She has normal strength. No cranial nerve deficit. Coordination and gait normal.  Reflex Scores:      Bicep reflexes are 2+ on the right side and 2+ on the left side.      Patellar reflexes are 2+ on the right side and 2+ on the left side. Skin: Skin is warm. No erythema.  No suspicious lesions. A few SK appreciated on upper abdomen and chest.  Psychiatric: She has a normal mood and affect. Her speech is normal.  Well groomed, good eye contact.      ASSESSMENT AND PLAN:     Promyce was seen today for annual exam.  Diagnoses and all orders for this visit:  Routine general  medical examination at a health care facility -     Pap IG and HPV (high risk) DNA detection ([facility])  Pap smear for cervical cancer screening -     Pap IG and HPV (high risk) DNA detection ([facility])  Asymptomatic microscopic hematuria -     Urinalysis with Reflex Microscopic; Future  Hypokalemia -     Potassium; Future  Pure hypercholesterolemia    -We discussed the importance of regular physical activity and healthy diet for prevention of chronic illness and/or complications. Preventive guidelines reviewed. -Colon cancer screening: Fib recommended, cards given.  -Continue healthy/low fat diet and increase physical activity as tolerated.   -She can continue following annually, before if needed.  -Continue following with orthopedist for left knee pain.  -Increase K+ drainage from intake, recommend decreasing frequency of HCTZ 25 mg from daily to every other day. We will recheck K+ in 3 months.  -Urine microscopic recommended in 3 months, she is initially reluctant to do so given the fact she has no symptoms and no recommendations will be given in the past by former PCP. She has no risk factors for bladder cancer but I feel like we need to follow on this  and consider urology evaluation if necessary in the future.      Return in about 1 year (around 04/17/2017) for Labs in 3 months. CPE in a year/fasting labs same day.         Kaenan Jake G. [name], MD  [facility]. [place] office.

## 2016-04-17 NOTE — Patient Instructions (Addendum)
A few things to remember from today's visit:   Routine general medical examination at a health care facility  Asymptomatic microscopic hematuria - Plan: Urinalysis with Reflex Microscopic  Hypokalemia - Plan: Potassium  Pure hypercholesterolemia    At least 150 minutes of moderate exercise per week, daily brisk walking for 15-30 min is a good exercise option. Healthy diet low in saturated (animal) fats and sweets and consisting of fresh fruits and vegetables, lean meats such as fish and white chicken and whole grains.   - Vaccines:  Tdap vaccine every 10 years.  Shingles vaccine recommended at age 6, could be given after 59 years of age but not sure about insurance coverage.  Pneumonia vaccines:  Prevnar 13 at 65 and Pneumovax at 46.  Screening recommendations for low/normal risk women:  Screening for diabetes at age 34-45 and every 3 years.  Cervical cancer prevention:   Pap smear every 3 years between women 44 and older if pap smear negative and HPV screening negative.   -Breast cancer: Mammogram: There is disagreement between experts about when to start screening in low risk asymptomatic female (40-45-50 years). > 55 years old every 2 years.   Colon cancer screening: starts at 58 years old until 59 years old.  Cholesterol disorder screening at age 21 and every 3 years.  - Urine and potassium to recheck in 3 months. Recommended decreasing the frequency of hydrochlorthiazide from daily to every other day. One banana daily to help with potassium.  Please be sure medication list is accurate. If a new problem present, please set up appointment sooner than planned today.

## 2016-04-17 NOTE — Telephone Encounter (Signed)
Called to let patient know that there is another fecal card that she can come pick up that allows her to keep taking her medication. Patient will come pick up tomorrow, pt is aware it is at the front office.

## 2016-04-17 NOTE — Progress Notes (Signed)
Pre visit review using our clinic review tool, if applicable. No additional management support is needed unless otherwise documented below in the visit note. 

## 2016-04-18 ENCOUNTER — Other Ambulatory Visit (HOSPITAL_COMMUNITY)
Admission: RE | Admit: 2016-04-18 | Discharge: 2016-04-18 | Disposition: A | Discharge status: Home / Self Care | Payer: Managed Care, Other (non HMO) | Source: Ambulatory Visit | Attending: Family Medicine | Admitting: Family Medicine

## 2016-04-18 ENCOUNTER — Other Ambulatory Visit: Payer: Self-pay | Admitting: Family Medicine

## 2016-04-18 DIAGNOSIS — Z1151 Encounter for screening for human papillomavirus (HPV): Secondary | ICD-10-CM | POA: Insufficient documentation

## 2016-04-18 DIAGNOSIS — Z01419 Encounter for gynecological examination (general) (routine) without abnormal findings: Secondary | ICD-10-CM | POA: Insufficient documentation

## 2016-04-18 DIAGNOSIS — Z0000 Encounter for general adult medical examination without abnormal findings: Secondary | ICD-10-CM

## 2016-04-19 LAB — CYTOLOGY - PAP

## 2016-04-25 ENCOUNTER — Other Ambulatory Visit: Payer: Managed Care, Other (non HMO)

## 2016-04-26 ENCOUNTER — Other Ambulatory Visit: Payer: Managed Care, Other (non HMO)

## 2016-04-26 ENCOUNTER — Other Ambulatory Visit: Payer: Self-pay | Admitting: Family Medicine

## 2016-04-26 DIAGNOSIS — R3121 Asymptomatic microscopic hematuria: Secondary | ICD-10-CM

## 2016-04-26 LAB — FECAL OCCULT BLOOD, IMMUNOCHEMICAL: Fecal Occult Bld: NEGATIVE

## 2016-07-04 ENCOUNTER — Ambulatory Visit (INDEPENDENT_AMBULATORY_CARE_PROVIDER_SITE_OTHER): Payer: Managed Care, Other (non HMO)

## 2016-07-04 DIAGNOSIS — Z23 Encounter for immunization: Secondary | ICD-10-CM | POA: Diagnosis not present

## 2016-07-13 ENCOUNTER — Other Ambulatory Visit: Payer: Self-pay | Admitting: Family Medicine

## 2017-04-09 ENCOUNTER — Other Ambulatory Visit: Payer: Self-pay | Admitting: Family Medicine

## 2017-04-09 DIAGNOSIS — Z1231 Encounter for screening mammogram for malignant neoplasm of breast: Secondary | ICD-10-CM

## 2017-04-10 ENCOUNTER — Ambulatory Visit
Admission: RE | Admit: 2017-04-10 | Discharge: 2017-04-10 | Disposition: A | Discharge status: Home / Self Care | Payer: Managed Care, Other (non HMO) | Source: Ambulatory Visit | Attending: Family Medicine | Admitting: Family Medicine

## 2017-04-10 DIAGNOSIS — Z1231 Encounter for screening mammogram for malignant neoplasm of breast: Secondary | ICD-10-CM

## 2017-04-29 NOTE — Progress Notes (Signed)
HPI:   Ariel Rios is a 61 y.o. female, who is here today for her routine physical.  Last CPE on 04/17/16.  Regular exercise 3 or more time per week: Not consistently . Planning on getting a treadmill in 2 months.  Following a healthy diet: Not consistently.  She lives with her husband.  Chronic medical problems: HLD, LE edema,microscopic hematuria, and anemia.  She takes HCTZ 25 mg daily as needed to help with lower extremity edema.  Lab Results  Component Value Date   CHOL 206 (H) 04/07/2016   HDL 51.80 04/07/2016   LDLCALC 135 (H) 04/07/2016   LDLDIRECT 150.4 10/21/2013   TRIG 99.0 04/07/2016   CHOLHDL 4 04/07/2016    Pap smear 04/18/16 : Negative for malignancy and neg HPV. Hx of abnormal pap smears: Denies. Hx of STD's Denies.  Immunization History  Administered Date(s) Administered  . Influenza Split 08/29/2011, 06/18/2012  . Influenza,inj,Quad PF,36+ Mos 07/29/2013, 06/08/2014, 06/21/2015, 07/04/2016  . Td 09/11/2005  . Tdap 11/17/2014    Mammogram: 04/11/17 [facility] 1. Colonoscopy: 01/17/2008.Negative immunochemical fecal blood test 04/26/16.  Hep C screening : Not done before.  Last CBC showed mild anemia, she denies any blood in stool, gross hematuria, easy bruising, frequent nose bleeds or gum bleeding.  Lab Results  Component Value Date   WBC 3.8 (L) 04/07/2016   HGB 11.9 (L) 04/07/2016   HCT 35.7 (L) 04/07/2016   MCV 86.5 04/07/2016   PLT 341.0 04/07/2016   LMP About 10 years ago.   Review of Systems  Constitutional: Negative for appetite change, fatigue, fever and unexpected weight change.  HENT: Negative for dental problem, hearing loss, mouth sores, nosebleeds, sore throat, trouble swallowing and voice change.   Eyes: Negative for pain and visual disturbance.  Respiratory: Negative for cough, shortness of breath and wheezing.   Cardiovascular: Positive for leg swelling. Negative for chest pain and palpitations.    Gastrointestinal: Negative for abdominal pain, blood in stool, nausea and vomiting.       No changes in bowel habits.  Endocrine: Negative for cold intolerance, heat intolerance, polydipsia, polyphagia and polyuria.  Genitourinary: Negative for decreased urine volume, dysuria, hematuria, vaginal bleeding and vaginal discharge.       No breast tenderness or nipple discharge.  Musculoskeletal: Negative for gait problem and myalgias.  Skin: Negative for rash.  Neurological: Negative for syncope, weakness, numbness and headaches.  Hematological: Negative for adenopathy. Does not bruise/bleed easily.  Psychiatric/Behavioral: Negative for confusion and sleep disturbance. The patient is not nervous/anxious.   All other systems reviewed and are negative.   No current outpatient prescriptions on file prior to visit.   No current facility-administered medications on file prior to visit.      Past Medical History:  Diagnosis Date  . Migraine   . Venous insufficiency    No past surgical history on file.  No Known Allergies  Family History  Problem Relation Age of Onset  . Diabetes Mother   . AVM Father   . Breast cancer Neg Hx     Social History   Social History  . Marital status: Married    Spouse name: N/A  . Number of children: N/A  . Years of education: N/A   Social History Main Topics  . Smoking status: Never Smoker  . Smokeless tobacco: Never Used  . Alcohol use No  . Drug use: No  . Sexual activity: Not Asked   Other Topics Concern  .  None   Social History Narrative  . None     Vitals:   04/30/17 0754  BP: 110/80  Pulse: 74  Resp: 12  SpO2: 96%   Body mass index is 35.35 kg/m.  O2 sat at RA 96%  Wt Readings from Last 3 Encounters:  04/30/17 193 lb 4 oz (87.7 kg)  04/17/16 197 lb 2 oz (89.4 kg)  03/27/16 197 lb (89.4 kg)    Physical Exam  Nursing note and vitals reviewed. Constitutional: She is oriented to person, place, and time. She appears  well-developed. No distress.  HENT:  Head: Atraumatic.  Right Ear: Hearing and external ear normal.  Left Ear: Hearing and external ear normal.  Mouth/Throat: Uvula is midline, oropharynx is clear and moist and mucous membranes are normal.  Cerumen excess bilateral, not able to see TMs  Eyes: Pupils are equal, round, and reactive to light. Conjunctivae and EOM are normal.  Neck: No tracheal deviation present. No thyromegaly present.  Cardiovascular: Normal rate and regular rhythm.   No murmur heard. Pulses:      Dorsalis pedis pulses are 2+ on the right side, and 2+ on the left side.  Mild varicose veins LE bilateral.  Respiratory: Effort normal and breath sounds normal. No respiratory distress.  GI: Soft. She exhibits no mass. There is no hepatomegaly. There is no tenderness.  Genitourinary: No breast swelling or tenderness.  Genitourinary Comments: Breast: No masses, skin changes, or nipple discharge bilaterally. Mild fibrocystic-like changes,outer upper quadrants L>R.  Musculoskeletal: She exhibits no edema or tenderness.  No major deformity or signs of synovitis appreciated.  Lymphadenopathy:    She has no cervical adenopathy.       Right: No supraclavicular adenopathy present.       Left: No supraclavicular adenopathy present.  Neurological: She is alert and oriented to person, place, and time. She has normal strength. No cranial nerve deficit. Coordination and gait normal.  Reflex Scores:      Bicep reflexes are 2+ on the right side and 2+ on the left side.      Patellar reflexes are 2+ on the right side and 2+ on the left side. Skin: Skin is warm. No rash noted. No erythema.  Psychiatric: She has a normal mood and affect. Her speech is normal.  Well groomed, good eye contact.     ASSESSMENT AND PLAN:   Ariel Rios was seen today for annual exam.  Diagnoses and all orders for this visit:  Lab Results  Component Value Date   CHOL 230 (H) 04/30/2017   HDL 70.50  04/30/2017   LDLCALC 139 (H) 04/30/2017   LDLDIRECT 150.4 10/21/2013   TRIG 103.0 04/30/2017   CHOLHDL 3 04/30/2017   Lab Results  Component Value Date   CREATININE 0.66 04/30/2017   BUN 15 04/30/2017   NA 140 04/30/2017   K 3.5 04/30/2017   CL 100 04/30/2017   CO2 29 04/30/2017   Lab Results  Component Value Date   WBC 5.5 04/30/2017   HGB 13.0 04/30/2017   HCT 39.8 04/30/2017   MCV 90.2 04/30/2017   PLT 296.0 04/30/2017     Routine physical examination   We discussed the importance of regular physical activity and healthy diet for prevention of chronic illness and/or complications. Preventive guidelines reviewed. Vaccination up to date.  Ca++ and vit D supplementation recommended. Next CPE in 1 year.  The 10-year ASCVD risk score Mikey Bussing DC Jr., et al., 2013) is: 2.8%   Values used  to calculate the score:     Age: 48 years     Sex: Female     Is Non-Hispanic African American: No     Diabetic: No     Tobacco smoker: No     Systolic Blood Pressure: 975 mmHg     Is BP treated: Yes     HDL Cholesterol: 70.5 mg/dL     Total Cholesterol: 230 mg/dL  Pure hypercholesterolemia  Continue nonpharmacologic treatment. Further recommendations would be given according to lab results.  -     Lipid panel  Anemia, unspecified type  Mild. Further recommendations will be given according to CBC results.  -     CBC with Differential -     Ferritin  Diabetes mellitus screening -     Basic metabolic panel  Encounter for HCV screening test for low risk patient -     Hepatitis C antibody screen  Colon cancer screening -     Ambulatory referral to Gastroenterology  Bilateral lower extremity edema  Today she is asymptomatic. We discussed some side effects of diuretics. Adequate hydration and K+ rich diet.  -     hydrochlorothiazide (HYDRODIURIL) 25 MG tablet; Take 1 tablet (25 mg total) by mouth daily as needed.  Asymptomatic microscopic hematuria  She did not  leave urine today,so we will bring her back for U/A.  -     Urinalysis, Routine w reflex microscopic; Future     Return in 1 year (on 04/30/2018) for routine.      [name]. [name], MD  [facility]. [place] office.

## 2017-04-30 ENCOUNTER — Ambulatory Visit (INDEPENDENT_AMBULATORY_CARE_PROVIDER_SITE_OTHER): Payer: Managed Care, Other (non HMO) | Admitting: Family Medicine

## 2017-04-30 ENCOUNTER — Encounter: Payer: Self-pay | Admitting: Family Medicine

## 2017-04-30 VITALS — BP 110/80 | HR 74 | Resp 12 | Ht 62.0 in | Wt 193.2 lb

## 2017-04-30 DIAGNOSIS — Z1159 Encounter for screening for other viral diseases: Secondary | ICD-10-CM

## 2017-04-30 DIAGNOSIS — Z131 Encounter for screening for diabetes mellitus: Secondary | ICD-10-CM

## 2017-04-30 DIAGNOSIS — R600 Localized edema: Secondary | ICD-10-CM | POA: Diagnosis not present

## 2017-04-30 DIAGNOSIS — E7800 Pure hypercholesterolemia, unspecified: Secondary | ICD-10-CM

## 2017-04-30 DIAGNOSIS — Z0000 Encounter for general adult medical examination without abnormal findings: Secondary | ICD-10-CM | POA: Diagnosis not present

## 2017-04-30 DIAGNOSIS — Z1211 Encounter for screening for malignant neoplasm of colon: Secondary | ICD-10-CM

## 2017-04-30 DIAGNOSIS — R3121 Asymptomatic microscopic hematuria: Secondary | ICD-10-CM | POA: Diagnosis not present

## 2017-04-30 DIAGNOSIS — D649 Anemia, unspecified: Secondary | ICD-10-CM | POA: Diagnosis not present

## 2017-04-30 LAB — BASIC METABOLIC PANEL
BUN: 15 mg/dL (ref 6–23)
CHLORIDE: 100 meq/L (ref 96–112)
CO2: 29 mEq/L (ref 19–32)
Calcium: 9.9 mg/dL (ref 8.4–10.5)
Creatinine, Ser: 0.66 mg/dL (ref 0.40–1.20)
GFR: 97.22 mL/min (ref >60.00)
Glucose, Bld: 83 mg/dL (ref 70–99)
POTASSIUM: 3.5 meq/L (ref 3.5–5.1)
Sodium: 140 mEq/L (ref 135–145)

## 2017-04-30 LAB — LIPID PANEL
CHOLESTEROL: 230 mg/dL — AB (ref 0–200)
HDL: 70.5 mg/dL (ref >39.00)
LDL Cholesterol: 139 mg/dL — ABNORMAL HIGH (ref 0–99)
NonHDL: 159.27
TRIGLYCERIDES: 103 mg/dL (ref 0.0–149.0)
Total CHOL/HDL Ratio: 3
VLDL: 20.6 mg/dL (ref 0.0–40.0)

## 2017-04-30 LAB — CBC WITH DIFFERENTIAL/PLATELET
BASOS PCT: 0.6 % (ref 0.0–3.0)
Basophils Absolute: 0 10*3/uL (ref 0.0–0.1)
EOS PCT: 1.2 % (ref 0.0–5.0)
Eosinophils Absolute: 0.1 10*3/uL (ref 0.0–0.7)
HCT: 39.8 % (ref 36.0–46.0)
Hemoglobin: 13 g/dL (ref 12.0–15.0)
LYMPHS ABS: 1.7 10*3/uL (ref 0.7–4.0)
Lymphocytes Relative: 31.3 % (ref 12.0–46.0)
MCHC: 32.8 g/dL (ref 30.0–36.0)
MCV: 90.2 fl (ref 78.0–100.0)
MONO ABS: 0.3 10*3/uL (ref 0.1–1.0)
MONOS PCT: 6.2 % (ref 3.0–12.0)
NEUTROS ABS: 3.4 10*3/uL (ref 1.4–7.7)
NEUTROS PCT: 60.7 % (ref 43.0–77.0)
PLATELETS: 296 10*3/uL (ref 150.0–400.0)
RBC: 4.41 Mil/uL (ref 3.87–5.11)
RDW: 14.4 % (ref 11.5–15.5)
WBC: 5.5 10*3/uL (ref 4.0–10.5)

## 2017-04-30 LAB — FERRITIN: FERRITIN: 42.3 ng/mL (ref 10.0–291.0)

## 2017-04-30 MED ORDER — HYDROCHLOROTHIAZIDE 25 MG PO TABS: 25.0000 mg | ORAL_TABLET | Freq: Every day | ORAL | 3 refills | Status: DC | PRN

## 2017-04-30 NOTE — Patient Instructions (Addendum)
A few things to remember from today's visit:   Pure hypercholesterolemia - Plan: Lipid panel  Anemia, unspecified type - Plan: CBC with Differential, Ferritin  Diabetes mellitus screening - Plan: Basic metabolic panel  Encounter for HCV screening test for low risk patient - Plan: Hepatitis C antibody screen  Colon cancer screening - Plan: Ambulatory referral to Gastroenterology    At least 150 minutes of moderate exercise per week, daily brisk walking for 15-30 min is a good exercise option. Healthy diet low in saturated (animal) fats and sweets and consisting of fresh fruits and vegetables, lean meats such as fish and white chicken and whole grains.   - Vaccines:  Tdap vaccine every 10 years. Last one in 2016.  Shingles vaccine recommended at age 84, could be given after 60 years of age but not sure about insurance coverage.  Pneumonia vaccines:  Prevnar 13 at 65 and Pneumovax at 67.  Screening recommendations for low/normal risk women:  Screening for diabetes at age 75-45 and every 3 years.  Cervical cancer prevention:  Done in 2017, due in 4 years.   -Breast cancer: Mammogram: next 03/2018.  Colon cancer screening: starts at 60 years old until 60 years old.  Cholesterol disorder screening at age 29 and every 3 years.  Also recommended:  1. Dental visit- Brush and floss your teeth twice daily; visit your dentist twice a year. 2. Eye doctor- Get an eye exam at least every 2 years. 3. Helmet use- Always wear a helmet when riding a bicycle, motorcycle, rollerblading or skateboarding. 4. Safe sex- If you may be exposed to sexually transmitted infections, use a condom. 5. Seat belts- Seat belts can save your live; always wear one. 6. Smoke/Carbon Monoxide detectors- These detectors need to be installed on the appropriate level of your home. Replace batteries at least once a year. 7. Skin cancer- When out in the sun please cover up and use sunscreen 15 SPF or  higher. 8. Violence- If anyone is threatening or hurting you, please tell your healthcare provider.  9. Drink alcohol in moderation- Limit alcohol intake to one drink or less per day. Never drink and drive.   Please be sure medication list is accurate. If a new problem present, please set up appointment sooner than planned today.

## 2017-05-01 LAB — HEPATITIS C ANTIBODY: HCV Ab: NONREACTIVE

## 2017-05-08 ENCOUNTER — Other Ambulatory Visit (INDEPENDENT_AMBULATORY_CARE_PROVIDER_SITE_OTHER): Payer: Managed Care, Other (non HMO)

## 2017-05-08 DIAGNOSIS — R3121 Asymptomatic microscopic hematuria: Secondary | ICD-10-CM | POA: Diagnosis not present

## 2017-05-08 LAB — URINALYSIS, ROUTINE W REFLEX MICROSCOPIC
BILIRUBIN URINE: NEGATIVE
Ketones, ur: NEGATIVE
LEUKOCYTES UA: NEGATIVE
Nitrite: NEGATIVE
PH: 6 (ref 5.0–8.0)
Specific Gravity, Urine: 1.01 (ref 1.000–1.030)
Total Protein, Urine: NEGATIVE
Urine Glucose: NEGATIVE
Urobilinogen, UA: 0.2 (ref 0.0–1.0)

## 2017-05-31 ENCOUNTER — Encounter: Payer: Self-pay | Admitting: Family Medicine

## 2017-12-20 ENCOUNTER — Encounter: Payer: Self-pay | Admitting: Internal Medicine

## 2018-03-06 ENCOUNTER — Encounter: Payer: Self-pay | Admitting: Family Medicine

## 2018-03-19 ENCOUNTER — Encounter: Payer: Self-pay | Admitting: Family Medicine

## 2018-03-19 ENCOUNTER — Ambulatory Visit (INDEPENDENT_AMBULATORY_CARE_PROVIDER_SITE_OTHER): Payer: 59 | Admitting: Family Medicine

## 2018-03-19 VITALS — BP 118/78 | HR 72 | Temp 97.9°F | Resp 12 | Ht 62.5 in | Wt 194.0 lb

## 2018-03-19 DIAGNOSIS — Z1211 Encounter for screening for malignant neoplasm of colon: Secondary | ICD-10-CM

## 2018-03-19 DIAGNOSIS — E785 Hyperlipidemia, unspecified: Secondary | ICD-10-CM | POA: Diagnosis not present

## 2018-03-19 DIAGNOSIS — R600 Localized edema: Secondary | ICD-10-CM | POA: Diagnosis not present

## 2018-03-19 DIAGNOSIS — Z0000 Encounter for general adult medical examination without abnormal findings: Secondary | ICD-10-CM | POA: Diagnosis not present

## 2018-03-19 DIAGNOSIS — Z23 Encounter for immunization: Secondary | ICD-10-CM

## 2018-03-19 DIAGNOSIS — Z131 Encounter for screening for diabetes mellitus: Secondary | ICD-10-CM

## 2018-03-19 DIAGNOSIS — R3121 Asymptomatic microscopic hematuria: Secondary | ICD-10-CM | POA: Diagnosis not present

## 2018-03-19 LAB — BASIC METABOLIC PANEL
BUN: 11 mg/dL (ref 6–23)
CHLORIDE: 97 meq/L (ref 96–112)
CO2: 31 meq/L (ref 19–32)
Calcium: 9.9 mg/dL (ref 8.4–10.5)
Creatinine, Ser: 0.55 mg/dL (ref 0.40–1.20)
GFR: 119.62 mL/min (ref >60.00)
GLUCOSE: 90 mg/dL (ref 70–99)
Potassium: 3.7 mEq/L (ref 3.5–5.1)
Sodium: 137 mEq/L (ref 135–145)

## 2018-03-19 LAB — LIPID PANEL
CHOLESTEROL: 234 mg/dL — AB (ref 0–200)
HDL: 73.2 mg/dL (ref >39.00)
LDL Cholesterol: 143 mg/dL — ABNORMAL HIGH (ref 0–99)
NonHDL: 160.65
Total CHOL/HDL Ratio: 3
Triglycerides: 87 mg/dL (ref 0.0–149.0)
VLDL: 17.4 mg/dL (ref 0.0–40.0)

## 2018-03-19 LAB — URINALYSIS, ROUTINE W REFLEX MICROSCOPIC
Bilirubin Urine: NEGATIVE
Ketones, ur: NEGATIVE
Nitrite: NEGATIVE
PH: 6.5 (ref 5.0–8.0)
TOTAL PROTEIN, URINE-UPE24: NEGATIVE
Urine Glucose: NEGATIVE
Urobilinogen, UA: 0.2 (ref 0.0–1.0)

## 2018-03-19 LAB — CBC
HEMATOCRIT: 38.7 % (ref 36.0–46.0)
Hemoglobin: 13.2 g/dL (ref 12.0–15.0)
MCHC: 34.1 g/dL (ref 30.0–36.0)
MCV: 87.4 fl (ref 78.0–100.0)
Platelets: 293 10*3/uL (ref 150.0–400.0)
RBC: 4.42 Mil/uL (ref 3.87–5.11)
RDW: 14.5 % (ref 11.5–15.5)
WBC: 4.8 10*3/uL (ref 4.0–10.5)

## 2018-03-19 MED ORDER — HYDROCHLOROTHIAZIDE 25 MG PO TABS: 25.0000 mg | ORAL_TABLET | Freq: Every day | ORAL | 3 refills | Status: DC | PRN

## 2018-03-19 NOTE — Assessment & Plan Note (Signed)
She continues being asymptomatic. We will continue following annually.

## 2018-03-19 NOTE — Assessment & Plan Note (Signed)
She will continue on nonpharmacologic treatment. Further recommendation will be given according to lab results as well as 10 years CVD score.

## 2018-03-19 NOTE — Assessment & Plan Note (Signed)
Most likely related to vein disease. No edema appreciated today. She will continue HCTZ 25 mg daily as needed. K+ containing diet to prevent hypokalemia. Follow-up in a year, before if needed.

## 2018-03-19 NOTE — Progress Notes (Signed)
HPI:   Ariel Rios is a 62 y.o. female, who is here today for her routine physical.  Last CPE: 04/30/17.  Regular exercise 3 or more time per week: Not consistently. Following a healthy diet: She has not been consistent with a healthy diet but recently she decreased salt intake. She lives with her husband.  Chronic medical problems: HLD,LE edema, and microscopic hematuria.  Pap smear 03/2016 negative, HPV neg. Hx of abnormal pap smears: Denies Hx of STD's: Negative.  Immunization History  Administered Date(s) Administered  . Influenza Split 08/29/2011, 06/18/2012  . Influenza,inj,Quad PF,6+ Mos 07/29/2013, 06/08/2014, 06/21/2015, 07/04/2016  . Td 09/11/2005  . Tdap 11/17/2014  . Zoster Recombinat (Shingrix) 03/19/2018    Mammogram: 03/2017 [facility] 1 Colonoscopy: 12/2007. Negative FIT 04/2016 DEXA: N/A  Hep C screening: 04/2017 NR  She has no concerns today.  Follow up:  LE edema: She takes HCTZ 25 mg daily. She is tolerating medication well.   Hyperlipidemia:  Currently on non pharmacologic treatment. Following a low fat diet: Not consistently  Lab Results  Component Value Date   CHOL 230 (H) 04/30/2017   HDL 70.50 04/30/2017   LDLCALC 139 (H) 04/30/2017   LDLDIRECT 150.4 10/21/2013   TRIG 103.0 04/30/2017   CHOLHDL 3 04/30/2017    Anemia: Chronic.  Lab Results  Component Value Date   WBC 5.5 04/30/2017   HGB 13.0 04/30/2017   HCT 39.8 04/30/2017   MCV 90.2 04/30/2017   PLT 296.0 04/30/2017   She is not on iron supplementation.  Microscopic hematuria: She has had negative work-up. Negative for gross hematuria, dysuria, or changes in urine frequency.  Review of Systems  Constitutional: Negative for appetite change, fatigue, fever and unexpected weight change.  HENT: Negative for dental problem, hearing loss, nosebleeds, sore throat, trouble swallowing and voice change.   Eyes: Negative for redness and visual disturbance.    Respiratory: Negative for cough, shortness of breath and wheezing.   Cardiovascular: Negative for chest pain, palpitations and leg swelling.  Gastrointestinal: Negative for abdominal pain, blood in stool, nausea and vomiting.       No changes in bowel habits.  Endocrine: Negative for cold intolerance, heat intolerance, polydipsia, polyphagia and polyuria.  Genitourinary: Negative for decreased urine volume, dysuria, hematuria, menstrual problem, vaginal bleeding and vaginal discharge.       No breast tenderness or nipple discharge.  Musculoskeletal: Negative for gait problem and myalgias.  Skin: Negative for rash.  Allergic/Immunologic: Positive for environmental allergies.  Neurological: Negative for syncope, weakness, numbness and headaches.  Hematological: Negative for adenopathy. Does not bruise/bleed easily.  Psychiatric/Behavioral: Negative for confusion and sleep disturbance. The patient is not nervous/anxious.   All other systems reviewed and are negative.     No current outpatient medications on file prior to visit.   No current facility-administered medications on file prior to visit.      Past Medical History:  Diagnosis Date  . Migraine   . Venous insufficiency     History reviewed. No pertinent surgical history.  No Known Allergies  Family History  Problem Relation Age of Onset  . Diabetes Mother   . AVM Father   . Breast cancer Neg Hx     Social History   Socioeconomic History  . Marital status: Married    Spouse name: Not on file  . Number of children: Not on file  . Years of education: Not on file  . Highest education level: Not on file  Occupational History  . Not on file  Social Needs  . Financial resource strain: Not on file  . Food insecurity:    Worry: Not on file    Inability: Not on file  . Transportation needs:    Medical: Not on file    Non-medical: Not on file  Tobacco Use  . Smoking status: Never Smoker  . Smokeless tobacco:  Never Used  Substance and Sexual Activity  . Alcohol use: No  . Drug use: No  . Sexual activity: Not on file  Lifestyle  . Physical activity:    Days per week: Not on file    Minutes per session: Not on file  . Stress: Not on file  Relationships  . Social connections:    Talks on phone: Not on file    Gets together: Not on file    Attends religious service: Not on file    Active member of club or organization: Not on file    Attends meetings of clubs or organizations: Not on file    Relationship status: Not on file  Other Topics Concern  . Not on file  Social History Narrative  . Not on file     Vitals:   03/19/18 0801  BP: 118/78  Pulse: 72  Resp: 12  Temp: 97.9 F (36.6 C)  SpO2: 98%   Body mass index is 34.92 kg/m.   Wt Readings from Last 3 Encounters:  03/19/18 194 lb (88 kg)  04/30/17 193 lb 4 oz (87.7 kg)  04/17/16 197 lb 2 oz (89.4 kg)      Physical Exam  Nursing note and vitals reviewed. Constitutional: She is oriented to person, place, and time. She appears well-developed. No distress.  HENT:  Head: Normocephalic and atraumatic.  Right Ear: Hearing and external ear normal.  Left Ear: Hearing and external ear normal.  Mouth/Throat: Uvula is midline, oropharynx is clear and moist and mucous membranes are normal.  Cerumen excess bilateral, not able to see TM.  Eyes: Pupils are equal, round, and reactive to light. Conjunctivae and EOM are normal.  Neck: No tracheal deviation present. No thyromegaly present.  Cardiovascular: Normal rate and regular rhythm.  No murmur heard. Pulses:      Dorsalis pedis pulses are 2+ on the right side, and 2+ on the left side.  Varicose veins in LE, bilateral  Respiratory: Effort normal and breath sounds normal. No respiratory distress.  GI: Soft. She exhibits no mass. There is no hepatomegaly. There is no tenderness.  Genitourinary:  Genitourinary Comments: Breast: Mild fibrocystic changes in outer quadrants.  No  masses, skin abnormalities, or nipple discharge appreciated bilaterally.  Musculoskeletal: She exhibits no edema.  No major deformity or signs of synovitis appreciated.  Lymphadenopathy:    She has no cervical adenopathy.    She has no axillary adenopathy.       Right: No supraclavicular adenopathy present.       Left: No supraclavicular adenopathy present.  Neurological: She is alert and oriented to person, place, and time. She has normal strength. No cranial nerve deficit. Gait normal.  Reflex Scores:      Bicep reflexes are 2+ on the right side and 2+ on the left side.      Patellar reflexes are 2+ on the right side and 2+ on the left side. Skin: Skin is warm. No rash noted. No erythema.  Psychiatric: She has a normal mood and affect. Her speech is normal.  Well groomed, good eye contact.  ASSESSMENT AND PLAN:  Ms. QUIERA DIFFEE was here today annual physical examination.  Orders Placed This Encounter  Procedures  . Varicella-zoster vaccine IM  . Urinalysis, Routine w reflex microscopic  . Basic metabolic panel  . CBC  . Lipid panel  . Ambulatory referral to Gastroenterology    Lab Results  Component Value Date   CHOL 234 (H) 03/19/2018   HDL 73.20 03/19/2018   LDLCALC 143 (H) 03/19/2018   LDLDIRECT 150.4 10/21/2013   TRIG 87.0 03/19/2018   CHOLHDL 3 03/19/2018   Lab Results  Component Value Date   CREATININE 0.55 03/19/2018   BUN 11 03/19/2018   NA 137 03/19/2018   K 3.7 03/19/2018   CL 97 03/19/2018   CO2 31 03/19/2018   Lab Results  Component Value Date   WBC 4.8 03/19/2018   HGB 13.2 03/19/2018   HCT 38.7 03/19/2018   MCV 87.4 03/19/2018   PLT 293.0 03/19/2018     Routine general medical examination at a health care facility  We discussed the importance of regular physical activity and healthy diet for prevention of chronic illness and/or complications. Preventive guidelines reviewed. Vaccination updated. Pap smear is current. She will  arrange mammogram for 04/2018. Ca++ and vit D supplementation recommended. Next CPE in a year.  The 10-year ASCVD risk score Mikey Bussing DC Brooke Bonito., et al., 2013) is: 3.5%   Values used to calculate the score:     Age: 6 years     Sex: Female     Is Non-Hispanic African American: No     Diabetic: No     Tobacco smoker: No     Systolic Blood Pressure: 150 mmHg     Is BP treated: Yes     HDL Cholesterol: 73.2 mg/dL     Total Cholesterol: 234 mg/dL  Diabetes mellitus screening -     Basic metabolic panel  Colon cancer screening -     Ambulatory referral to Gastroenterology   Need for shingles vaccine -     Varicella-zoster vaccine IM   Hyperlipidemia She will continue on nonpharmacologic treatment. Further recommendation will be given according to lab results as well as 10 years CVD score.  Bilateral lower extremity edema Most likely related to vein disease. No edema appreciated today. She will continue HCTZ 25 mg daily as needed. K+ containing diet to prevent hypokalemia. Follow-up in a year, before if needed.  Asymptomatic microscopic hematuria She continues being asymptomatic. We will continue following annually.      Return in 1 year (on 03/20/2019) for CPE and f/u.      Zeki Bedrosian G. [name], MD  [facility]. [place] office.

## 2018-03-19 NOTE — Patient Instructions (Addendum)
A few things to remember from today's visit:   Routine general medical examination at a health care facility  Hyperlipidemia, unspecified hyperlipidemia type - Plan: Lipid panel  Bilateral lower extremity edema - Plan: hydrochlorothiazide (HYDRODIURIL) 25 MG tablet  Diabetes mellitus screening - Plan: Basic metabolic panel  Colon cancer screening - Plan: Ambulatory referral to Gastroenterology  Asymptomatic microscopic hematuria - Plan: Urinalysis, Routine w reflex microscopic, CBC  Today you have you routine preventive visit.  At least 150 minutes of moderate exercise per week, daily brisk walking for 15-30 min is a good exercise option. Healthy diet low in saturated (animal) fats and sweets and consisting of fresh fruits and vegetables, lean meats such as fish and white chicken and whole grains.  These are some of recommendations for screening depending of age and risk factors:   - Vaccines:  Tdap vaccine every 10 years.  Shingles vaccine recommended at age 28, could be given after 61 years of age but not sure about insurance coverage.   Pneumonia vaccines:  Prevnar 13 at 65 and Pneumovax at 39. Sometimes Pneumovax is giving earlier if history of smoking, lung disease,diabetes,kidney disease among some.    Screening for diabetes at age 46 and every 3 years.  Cervical cancer prevention:  Pap smear starts at 61 years of age and continues periodically until 61 years old in low risk women. Pap smear every 3 years between 10 and 38 years old. Pap smear every 3-5 years between women 28 and older if pap smear negative and HPV screening negative.   -Breast cancer: Mammogram: There is disagreement between experts about when to start screening in low risk asymptomatic female but recent recommendations are to start screening at 57 and not later than 61 years old , every 1-2 years and after 61 yo q 2 years. Screening is recommended until 61 years old but some women can continue  screening depending of healthy issues.   Colon cancer screening: starts at 61 years old until 61 years old.  Cholesterol disorder screening at age 10 and every 3 years.  Also recommended:  1. Dental visit- Brush and floss your teeth twice daily; visit your dentist twice a year. 2. Eye doctor- Get an eye exam at least every 2 years. 3. Helmet use- Always wear a helmet when riding a bicycle, motorcycle, rollerblading or skateboarding. 4. Safe sex- If you may be exposed to sexually transmitted infections, use a condom. 5. Seat belts- Seat belts can save your live; always wear one. 6. Smoke/Carbon Monoxide detectors- These detectors need to be installed on the appropriate level of your home. Replace batteries at least once a year. 7. Skin cancer- When out in the sun please cover up and use sunscreen 15 SPF or higher. 8. Violence- If anyone is threatening or hurting you, please tell your healthcare provider.  9. Drink alcohol in moderation- Limit alcohol intake to one drink or less per day. Never drink and drive.  Please be sure medication list is accurate. If a new problem present, please set up appointment sooner than planned today.

## 2018-03-24 ENCOUNTER — Encounter: Payer: Self-pay | Admitting: Family Medicine

## 2018-04-23 ENCOUNTER — Ambulatory Visit: Payer: 59

## 2018-05-03 ENCOUNTER — Encounter: Payer: Managed Care, Other (non HMO) | Admitting: Family Medicine

## 2018-05-21 ENCOUNTER — Ambulatory Visit (INDEPENDENT_AMBULATORY_CARE_PROVIDER_SITE_OTHER): Payer: 59 | Admitting: Family Medicine

## 2018-05-21 DIAGNOSIS — Z23 Encounter for immunization: Secondary | ICD-10-CM

## 2018-05-28 ENCOUNTER — Encounter: Payer: Self-pay | Admitting: Family Medicine

## 2018-10-15 ENCOUNTER — Encounter: Payer: Self-pay | Admitting: Family Medicine

## 2018-10-15 ENCOUNTER — Ambulatory Visit: Payer: 59 | Admitting: Family Medicine

## 2018-10-15 VITALS — BP 130/70 | HR 72 | Temp 97.8°F | Resp 12 | Ht 62.5 in | Wt 186.0 lb

## 2018-10-15 DIAGNOSIS — H9190 Unspecified hearing loss, unspecified ear: Secondary | ICD-10-CM | POA: Diagnosis not present

## 2018-10-15 DIAGNOSIS — R519 Headache, unspecified: Secondary | ICD-10-CM

## 2018-10-15 DIAGNOSIS — R51 Headache: Secondary | ICD-10-CM | POA: Diagnosis not present

## 2018-10-15 DIAGNOSIS — R002 Palpitations: Secondary | ICD-10-CM

## 2018-10-15 DIAGNOSIS — R5383 Other fatigue: Secondary | ICD-10-CM | POA: Diagnosis not present

## 2018-10-15 LAB — CBC WITH DIFFERENTIAL/PLATELET
Basophils Absolute: 0 10*3/uL (ref 0.0–0.1)
Basophils Relative: 0.6 % (ref 0.0–3.0)
Eosinophils Absolute: 0.1 10*3/uL (ref 0.0–0.7)
Eosinophils Relative: 1.8 % (ref 0.0–5.0)
HCT: 39.9 % (ref 36.0–46.0)
Hemoglobin: 13.4 g/dL (ref 12.0–15.0)
Lymphocytes Relative: 28.7 % (ref 12.0–46.0)
Lymphs Abs: 1.3 10*3/uL (ref 0.7–4.0)
MCHC: 33.7 g/dL (ref 30.0–36.0)
MCV: 87.6 fl (ref 78.0–100.0)
Monocytes Absolute: 0.4 10*3/uL (ref 0.1–1.0)
Monocytes Relative: 8.7 % (ref 3.0–12.0)
Neutro Abs: 2.7 10*3/uL (ref 1.4–7.7)
Neutrophils Relative %: 60.2 % (ref 43.0–77.0)
Platelets: 289 10*3/uL (ref 150.0–400.0)
RBC: 4.55 Mil/uL (ref 3.87–5.11)
RDW: 13.9 % (ref 11.5–15.5)
WBC: 4.5 10*3/uL (ref 4.0–10.5)

## 2018-10-15 LAB — BASIC METABOLIC PANEL
BUN: 18 mg/dL (ref 6–23)
CALCIUM: 9.8 mg/dL (ref 8.4–10.5)
CO2: 33 meq/L — AB (ref 19–32)
Chloride: 98 mEq/L (ref 96–112)
Creatinine, Ser: 0.55 mg/dL (ref 0.40–1.20)
GFR: 112.33 mL/min (ref >60.00)
GLUCOSE: 92 mg/dL (ref 70–99)
Potassium: 3.9 mEq/L (ref 3.5–5.1)
Sodium: 139 mEq/L (ref 135–145)

## 2018-10-15 LAB — VITAMIN D 25 HYDROXY (VIT D DEFICIENCY, FRACTURES): VITD: 23.23 ng/mL — AB (ref 30.00–100.00)

## 2018-10-15 LAB — TSH: TSH: 2.18 u[IU]/mL (ref 0.35–4.50)

## 2018-10-15 NOTE — Patient Instructions (Addendum)
A few things to remember from today's visit:   Palpitations - Plan: EKG 12-Lead, Ambulatory referral to Cardiology, Basic metabolic panel, TSH, CBC with Differential/Platelet  Other fatigue - Plan: Vitamin B12, VITAMIN D 25 Hydroxy (Vit-D Deficiency, Fractures)  Perceived hearing changes   Please be sure medication list is accurate. If a new problem present, please set up appointment sooner than planned today.

## 2018-10-15 NOTE — Progress Notes (Signed)
ACUTE VISIT   HPI:  Chief Complaint  Patient presents with  . Multiple Complaints    Pt c/o palpitations, fatigue, hair loss and decreased hearing from a fall.    Ariel Rios is a 63 y.o. female, who is here today complaining of a couple months of worsening fatigue, palpitations, and bitemporal headache just when coughing or sneezing. Headache is about 6/10, it lasts seconds, exacerbated by coughing or sneezing.  No associated nausea, vomiting, or visual changes. Headache "always",better after menopause.  2 months of fatigue, reporting problem as new. "I know I am not depressed." Trouble sleeping for a while. Sleeps 3 hours here and then.  No unusual physical activity or stress. Her family "lives long time", so she "should not feel like this." For years her hair is gradually thinning.  Requesting lab work,including B12.  Reporting screening test recommendations for her age up to date except colonoscopy, due in 2019.   She is not feeling "right", mentioned that her husband was diagnosed with leukemia years ago,found incidentally through blood work, afraid of having a serious illness going on.Ariel Rios  "Whole body vibrating", denies tremor or unstable gait.   Lab Results  Component Value Date   TSH 1.48 04/07/2016   Also complaining about bilateral hearing changes after a fall a few weeks ago, worse at the end of the day.Started after fall. She tripped with Christmas decorations wires while she was walking on her yard. He denies earache or ear drainage. Hearing seems to be back to her normal.  A couple months of palpitations, "fluttering" ,negative for chest pain. States that her hear is beating hard,she can notice "heart beating,moves my clothes sometimes." Exacerbated by "rushing" or when she has a "deathline" to complete a task.    Review of Systems  Constitutional: Positive for fatigue. Negative for activity change, appetite change, fever and unexpected  weight change.  HENT: Positive for hearing loss. Negative for ear discharge, ear pain, mouth sores, nosebleeds, sore throat and trouble swallowing.   Eyes: Negative for redness and visual disturbance.  Respiratory: Negative for cough, shortness of breath and wheezing.   Cardiovascular: Positive for palpitations. Negative for chest pain and leg swelling.  Gastrointestinal: Negative for abdominal pain, blood in stool, nausea and vomiting.       Negative for changes in bowel habits.  Endocrine: Negative for cold intolerance and heat intolerance.  Genitourinary: Negative for decreased urine volume, dysuria and hematuria.  Musculoskeletal: Positive for arthralgias (R hip pain,followed with orth,planning on PT). Negative for gait problem.  Skin: Negative for pallor and rash.  Neurological: Positive for dizziness and headaches. Negative for syncope and weakness.  Psychiatric/Behavioral: Positive for sleep disturbance. Negative for confusion. The patient is nervous/anxious.       Current Outpatient Medications on File Prior to Visit  Medication Sig Dispense Refill  . hydrochlorothiazide (HYDRODIURIL) 25 MG tablet Take 1 tablet (25 mg total) by mouth daily as needed. 90 tablet 3  . meloxicam (MOBIC) 15 MG tablet     . methocarbamol (ROBAXIN) 500 MG tablet      No current facility-administered medications on file prior to visit.      Past Medical History:  Diagnosis Date  . Migraine   . Venous insufficiency    No Known Allergies  Social History   Socioeconomic History  . Marital status: Married    Spouse name: Not on file  . Number of children: Not on file  . Years of education: Not  on file  . Highest education level: Not on file  Occupational History  . Not on file  Social Needs  . Financial resource strain: Not on file  . Food insecurity:    Worry: Not on file    Inability: Not on file  . Transportation needs:    Medical: Not on file    Non-medical: Not on file  Tobacco  Use  . Smoking status: Never Smoker  . Smokeless tobacco: Never Used  Substance and Sexual Activity  . Alcohol use: No  . Drug use: No  . Sexual activity: Not on file  Lifestyle  . Physical activity:    Days per week: Not on file    Minutes per session: Not on file  . Stress: Not on file  Relationships  . Social connections:    Talks on phone: Not on file    Gets together: Not on file    Attends religious service: Not on file    Active member of club or organization: Not on file    Attends meetings of clubs or organizations: Not on file    Relationship status: Not on file  Other Topics Concern  . Not on file  Social History Narrative  . Not on file    Vitals:   10/15/18 1200  BP: 130/70  Pulse: 72  Resp: 12  Temp: 97.8 F (36.6 C)   Body mass index is 33.48 kg/m.  Wt Readings from Last 3 Encounters:  10/15/18 186 lb (84.4 kg)  03/19/18 194 lb (88 kg)  04/30/17 193 lb 4 oz (87.7 kg)    Physical Exam  Nursing note and vitals reviewed. Constitutional: She is oriented to person, place, and time. She appears well-developed. No distress.  HENT:  Head: Normocephalic and atraumatic.  Mouth/Throat: Oropharynx is clear and moist and mucous membranes are normal.  Eyes: Pupils are equal, round, and reactive to light. Conjunctivae and EOM are normal.  Cardiovascular: Normal rate and regular rhythm.  Occasional extrasystoles (x 3 in a minute) are present.  No murmur heard. Respiratory: Effort normal and breath sounds normal. No respiratory distress.  GI: Soft. She exhibits no mass. There is no hepatomegaly. There is no abdominal tenderness.  Musculoskeletal:        General: No edema.  Lymphadenopathy:    She has no cervical adenopathy.  Neurological: She is alert and oriented to person, place, and time. She has normal strength. No cranial nerve deficit. Gait normal.  Reflex Scores:      Bicep reflexes are 2+ on the right side and 2+ on the left side.      Patellar  reflexes are 2+ on the right side and 2+ on the left side. Skin: Skin is warm. No rash noted. No erythema.  Psychiatric: Her mood appears anxious.  Well groomed, good eye contact.     ASSESSMENT AND PLAN:  [name] was seen today for multiple complaints.  Diagnoses and all orders for this visit:  Lab Results  Component Value Date   TSH 2.18 10/15/2018   Lab Results  Component Value Date   CREATININE 0.55 10/15/2018   BUN 18 10/15/2018   NA 139 10/15/2018   K 3.9 10/15/2018   CL 98 10/15/2018   CO2 33 (H) 10/15/2018   Lab Results  Component Value Date   WBC 4.5 10/15/2018   HGB 13.4 10/15/2018   HCT 39.9 10/15/2018   MCV 87.6 10/15/2018   PLT 289.0 10/15/2018   Lab Results  Component  Value Date   VITAMINB12 277 10/15/2018    Palpitations Possible etiologies discussed. On exam 3 extra beats,not caught on EKG. EKG today: SR,LAD, ? LAE,poor R wave progression. Compared with EKG in 10/2014 no significant changes except for poor R progression. Clearly instructed about warning signs. Cardiology referral placed.  -     EKG 12-Lead -     Ambulatory referral to Cardiology -     Basic metabolic panel -     TSH -     CBC with Differential/Platelet  Other fatigue We discussed possible etiologies: Systemic illness, immunologic,endocrinology,sleep disorder, psychiatric/psychologic, infectious,medications side effects, and idiopathic. Examination today does not suggest a serious process. Lack of good sleep most likely contributing factor.  Healthy diet and regular physical activity may help.  Further recommendations will be given according to lab results.  -     Vitamin B12 -     VITAMIN D 25 Hydroxy (Vit-D Deficiency, Fractures)  Perceived hearing changes Reporting going back to its baseline. Gross hearing intact. Instructed about warning signs.  Headache, unspecified headache type Neurologist exam normal. Hx does not suggest serious problem,so I do not think  head imaging is needed. Reporting Hx of headaches. F/U in 6 weeks,before if needed.    Return in about 6 weeks (around 11/26/2018).     Aristotle Lieb G. [name], MD  [facility]. [place] office.

## 2018-10-17 LAB — VITAMIN B12: Vitamin B-12: 277 pg/mL (ref 211–911)

## 2018-10-18 ENCOUNTER — Telehealth: Payer: Self-pay | Admitting: Family Medicine

## 2018-10-18 NOTE — Telephone Encounter (Signed)
Copied from [place] [id number]. Topic: Quick Communication - See Telephone Encounter >> Oct 18, 2018  2:04 PM Blase Mess A wrote: CRM for notification. See Telephone encounter for: 10/18/18.  Patient is calling back for her lab results form 10/15/2018 Please advise [phone]

## 2018-10-18 NOTE — Telephone Encounter (Signed)
Patient inquiring about lab results. Please advise

## 2018-10-18 NOTE — Telephone Encounter (Signed)
Message sent to Dr. Jordan for review. 

## 2018-10-19 ENCOUNTER — Encounter: Payer: Self-pay | Admitting: Family Medicine

## 2018-10-20 NOTE — Telephone Encounter (Signed)
Lab results were reviewed and sent through My chart. Thanks, BJ

## 2018-10-21 NOTE — Progress Notes (Signed)
Cardiology Office Note   Date:  10/23/2018   ID:  Ariel, Rios 1956/05/20, MRN [id number]  PCP:  [name], [name], MD  Cardiologist:   Jenkins Rouge, MD   No chief complaint on file.     History of Present Illness: Ariel Rios is a 63 y.o. 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 She is sedentary   Past Medical History:  Diagnosis Date  . Migraine   . Venous insufficiency     No past surgical history on file.   Current Outpatient Medications  Medication Sig Dispense Refill  . hydrochlorothiazide (HYDRODIURIL) 25 MG tablet Take 1 tablet (25 mg total) by mouth daily as needed. 90 tablet 3  . meloxicam (MOBIC) 15 MG tablet Take 15 mg by mouth daily.     . methocarbamol (ROBAXIN) 500 MG tablet Take 500 mg by mouth every 6 (six) hours as needed. Take as needed for muscle spasms.     No current facility-administered medications for this visit.     Allergies:   Patient has no known allergies.    Social History:  The patient  reports that she has never smoked. She has never used smokeless tobacco. She reports that she does not drink alcohol or use drugs.   Family History:  The patient's family history includes AVM in her father; Diabetes in her mother.    ROS:  Please see the history of present illness.   Otherwise, review of systems are positive for none.   All other systems are reviewed and negative.    PHYSICAL EXAM: VS:  BP 126/86   Pulse 86   Ht 5' 2.5" (1.588 m)   Wt 188 lb 12.8 oz (85.6 kg)   SpO2 97%   BMI 33.98  kg/m  , BMI Body mass index is 33.98 kg/m. Affect appropriate Healthy:  appears stated age 49: normal Neck supple with no adenopathy JVP normal no bruits no thyromegaly Lungs clear with no wheezing and good diaphragmatic motion Heart:  S1/S2 no murmur, no rub, gallop or click PMI normal Abdomen: benighn, BS positve, no tenderness, no AAA no bruit.  No HSM or HJR Distal pulses intact with no bruits No edema Neuro non-focal Skin warm and dry No muscular weakness    EKG:  SR rate 81 normal    Recent Labs: 10/15/2018: BUN 18; Creatinine, Ser 0.55; Hemoglobin 13.4; Platelets 289.0; Potassium 3.9; Sodium 139; TSH 2.18    Lipid Panel    Component Value Date/Time   CHOL 234 (H) 03/19/2018 0854   TRIG 87.0 03/19/2018 0854   HDL 73.20 03/19/2018 0854   CHOLHDL 3 03/19/2018 0854   VLDL 17.4 03/19/2018 0854   LDLCALC 143 (H) 03/19/2018 0854   LDLDIRECT 150.4 10/21/2013 0832      Wt Readings from Last 3 Encounters:  10/23/18 188 lb 12.8 oz (85.6 kg)  10/15/18 186 lb (84.4 kg)  03/19/18 194 lb (88 kg)      Other studies Reviewed: Additional studies/ records that were reviewed today include: notes from  primary ECG and labs .    ASSESSMENT AND PLAN:  1.  Palpitations benign sounding discussed with patient if they recur/worsen can get monitor  2.  HTN:  Continue diet control low sodium  3. Anxiety : seems like some of her somatic complaints are related to this    Current medicines are reviewed at length with the patient today.  The patient does not have concerns regarding medicines.  The following changes have been made:  no change  Labs/ tests ordered today include: None  No orders of the defined types were placed in this encounter.    Disposition:   FU with cardiology PRN      Signed, Jenkins Rouge, MD  10/23/2018 2:45 PM    [facility] [address], [place], [place]  [address] Phone: [phone]; Fax: [phone]

## 2018-10-21 NOTE — Telephone Encounter (Signed)
Noted  

## 2018-10-22 ENCOUNTER — Encounter: Payer: Self-pay | Admitting: Cardiovascular Disease

## 2018-10-23 ENCOUNTER — Encounter: Payer: Self-pay | Admitting: Cardiovascular Disease

## 2018-10-23 ENCOUNTER — Ambulatory Visit: Payer: 59 | Admitting: Cardiovascular Disease

## 2018-10-23 VITALS — BP 126/86 | HR 86 | Ht 62.5 in | Wt 188.8 lb

## 2018-10-23 DIAGNOSIS — R002 Palpitations: Secondary | ICD-10-CM | POA: Diagnosis not present

## 2018-10-23 NOTE — Addendum Note (Signed)
Addended by: Gar Ponto on: 10/23/2018 03:00 PM   Modules accepted: Orders

## 2018-10-23 NOTE — Patient Instructions (Signed)
Medication Instructions:   If you need a refill on your cardiac medications before your next appointment, please call your pharmacy.   Lab work:  If you have labs (blood work) drawn today and your tests are completely normal, you will receive your results only by: . MyChart Message (if you have MyChart) OR . A paper copy in the mail If you have any lab test that is abnormal or we need to change your treatment, we will call you to review the results.  Testing/Procedures: None ordered today.  Follow-Up: At [facility], you and your health needs are our priority.  As part of our continuing mission to provide you with exceptional heart care, we have created designated Provider Care Teams.  These Care Teams include your primary Cardiologist (physician) and Advanced Practice Providers (APPs -  Physician Assistants and Nurse Practitioners) who all work together to provide you with the care you need, when you need it. Your physician recommends that you schedule a follow-up appointment as needed with Dr. Nishan.   

## 2018-10-23 NOTE — Addendum Note (Signed)
Addended by: Gar Ponto on: 10/23/2018 02:49 PM   Modules accepted: Orders

## 2018-12-09 ENCOUNTER — Ambulatory Visit (INDEPENDENT_AMBULATORY_CARE_PROVIDER_SITE_OTHER): Payer: 59 | Admitting: Family Medicine

## 2018-12-09 ENCOUNTER — Other Ambulatory Visit: Payer: Self-pay

## 2018-12-09 ENCOUNTER — Encounter: Payer: Self-pay | Admitting: Family Medicine

## 2018-12-09 VITALS — BP 118/78 | HR 73 | Temp 98.4°F | Resp 12 | Ht 62.75 in | Wt 189.2 lb

## 2018-12-09 DIAGNOSIS — Z13228 Encounter for screening for other metabolic disorders: Secondary | ICD-10-CM

## 2018-12-09 DIAGNOSIS — E785 Hyperlipidemia, unspecified: Secondary | ICD-10-CM

## 2018-12-09 DIAGNOSIS — E538 Deficiency of other specified B group vitamins: Secondary | ICD-10-CM

## 2018-12-09 DIAGNOSIS — Z1329 Encounter for screening for other suspected endocrine disorder: Secondary | ICD-10-CM

## 2018-12-09 DIAGNOSIS — Z130 Encounter for screening for diseases of the blood and blood-forming organs and certain disorders involving the immune mechanism: Secondary | ICD-10-CM | POA: Diagnosis not present

## 2018-12-09 DIAGNOSIS — Z0000 Encounter for general adult medical examination without abnormal findings: Secondary | ICD-10-CM | POA: Diagnosis not present

## 2018-12-09 DIAGNOSIS — R3121 Asymptomatic microscopic hematuria: Secondary | ICD-10-CM

## 2018-12-09 DIAGNOSIS — E559 Vitamin D deficiency, unspecified: Secondary | ICD-10-CM

## 2018-12-09 DIAGNOSIS — R600 Localized edema: Secondary | ICD-10-CM

## 2018-12-09 LAB — LIPID PANEL
Cholesterol: 216 mg/dL — ABNORMAL HIGH (ref 0–200)
HDL: 65.4 mg/dL (ref >39.00)
LDL Cholesterol: 125 mg/dL — ABNORMAL HIGH (ref 0–99)
NonHDL: 150.28
Total CHOL/HDL Ratio: 3
Triglycerides: 128 mg/dL (ref 0.0–149.0)
VLDL: 25.6 mg/dL (ref 0.0–40.0)

## 2018-12-09 LAB — BASIC METABOLIC PANEL
BUN: 16 mg/dL (ref 6–23)
CO2: 31 mEq/L (ref 19–32)
Calcium: 9.7 mg/dL (ref 8.4–10.5)
Chloride: 100 mEq/L (ref 96–112)
Creatinine, Ser: 0.51 mg/dL (ref 0.40–1.20)
GFR: 122.5 mL/min (ref >60.00)
GLUCOSE: 86 mg/dL (ref 70–99)
Potassium: 3.5 mEq/L (ref 3.5–5.1)
SODIUM: 140 meq/L (ref 135–145)

## 2018-12-09 LAB — URINALYSIS, ROUTINE W REFLEX MICROSCOPIC
Bilirubin Urine: NEGATIVE
KETONES UR: NEGATIVE
Leukocytes,Ua: NEGATIVE
Nitrite: NEGATIVE
Specific Gravity, Urine: 1.02 (ref 1.000–1.030)
Total Protein, Urine: NEGATIVE
Urine Glucose: NEGATIVE
Urobilinogen, UA: 0.2 (ref 0.0–1.0)
pH: 7.5 (ref 5.0–8.0)

## 2018-12-09 LAB — VITAMIN D 25 HYDROXY (VIT D DEFICIENCY, FRACTURES): VITD: 24 ng/mL — ABNORMAL LOW (ref 30.00–100.00)

## 2018-12-09 LAB — VITAMIN B12: Vitamin B-12: 595 pg/mL (ref 211–911)

## 2018-12-09 NOTE — Assessment & Plan Note (Signed)
No changes in current management, will follow labs done today and will give further recommendations accordingly.  

## 2018-12-09 NOTE — Assessment & Plan Note (Signed)
No changes in vitamin B-12 supplementation. Further recommendation will be given according to B12 levels.

## 2018-12-09 NOTE — Assessment & Plan Note (Signed)
Continue low-fat diet. Further recommendation will be given according to lipid panel results. 

## 2018-12-09 NOTE — Assessment & Plan Note (Addendum)
Asymptomatic. Further recommendation will be given according to UA results.

## 2018-12-09 NOTE — Patient Instructions (Addendum)
A few things to remember from today's visit:   Routine general medical examination at a health care facility  Hyperlipidemia, unspecified hyperlipidemia type - Plan: Lipid panel  Asymptomatic microscopic hematuria - Plan: Urinalysis, Routine w reflex microscopic  Vitamin D deficiency, unspecified - Plan: VITAMIN D 25 Hydroxy (Vit-D Deficiency, Fractures)  B12 deficiency - Plan: Vitamin B12  Screening for endocrine, metabolic and immunity disorder - Plan: Basic metabolic panel  Today you have you routine preventive visit.  At least 150 minutes of moderate exercise per week, daily brisk walking for 15-30 min is a good exercise option. Healthy diet low in saturated (animal) fats and sweets and consisting of fresh fruits and vegetables, lean meats such as fish and white chicken and whole grains.  These are some of recommendations for screening depending of age and risk factors:   - Vaccines:  Tdap vaccine every 10 years.  Shingles vaccine recommended at age 59, could be given after 62 years of age but not sure about insurance coverage.   Pneumonia vaccines:  Prevnar 13 at 65 and Pneumovax at 13. Sometimes Pneumovax is giving earlier if history of smoking, lung disease,diabetes,kidney disease among some.    Screening for diabetes at age 42 and every 3 years.  Cervical cancer prevention:  Pap smear starts at 62 years of age and continues periodically until 62 years old in low risk women. Pap smear every 3 years between 66 and 12 years old. Pap smear every 3-5 years between women 21 and older if pap smear negative and HPV screening negative. Due in 2022  -Breast cancer: Mammogram: There is disagreement between experts about when to start screening in low risk asymptomatic female but recent recommendations are to start screening at 29 and not later than 62 years old , every 1-2 years and after 62 yo q 2 years. Screening is recommended until 62 years old but some women can continue  screening depending of healthy issues.   Colon cancer screening: starts at 62 years old until 62 years old.Please schedule appointment with gastro.  Also recommended:  1. Dental visit- Brush and floss your teeth twice daily; visit your dentist twice a year. 2. Eye doctor- Get an eye exam at least every 2 years. 3. Helmet use- Always wear a helmet when riding a bicycle, motorcycle, rollerblading or skateboarding. 4. Safe sex- If you may be exposed to sexually transmitted infections, use a condom. 5. Seat belts- Seat belts can save your live; always wear one. 6. Smoke/Carbon Monoxide detectors- These detectors need to be installed on the appropriate level of your home. Replace batteries at least once a year. 7. Skin cancer- When out in the sun please cover up and use sunscreen 15 SPF or higher. 8. Violence- If anyone is threatening or hurting you, please tell your healthcare provider.  9. Drink alcohol in moderation- Limit alcohol intake to one drink or less per day. Never drink and drive.   Please be sure medication list is accurate. If a new problem present, please set up appointment sooner than planned today.

## 2018-12-09 NOTE — Assessment & Plan Note (Signed)
Problem is well controlled. Continue HCTZ 25 mg daily.

## 2018-12-09 NOTE — Progress Notes (Signed)
HPI:   Ariel Rios is a 63 y.o. female, who is here today for her routine physical.  Last CPE: 03/19/2018.  Regular exercise 3 or more time per week: Not regularly but she considers herself active at work. Following a healthy diet: Not consistently. She lives with her husband.  Chronic medical problems: Lower extremity edema, microscopic hematuria, vitamin D deficiency, and hyperlipidemia among some.  She takes HCTZ 25 mg daily for lower extremity edema.  Pap smear in 03/2016, Pap smear and HPV negative. Hx of abnormal pap smears: Denies.   Immunization History  Administered Date(s) Administered  . Influenza Split 08/29/2011, 06/18/2012  . Influenza,inj,Quad PF,6+ Mos 07/29/2013, 06/08/2014, 06/21/2015, 07/04/2016  . Influenza-Unspecified 07/23/2018  . Td 09/11/2005  . Tdap 11/17/2014  . Zoster Recombinat (Shingrix) 03/19/2018, 05/21/2018    Mammogram: 03/2017, BI-RADS 1 Colonoscopy: Referral was placed last year.  According the patient, she received a phone call but she has not scheduled it yet. DEXA: N/A  Hep C screening in 04/2017, NR. B12 deficiency, she is taking B12 at thousand micrograms to 3 times per week. B12 on 10/15/2018 was 277.  Vitamin D deficiency, 25 OH vitamin D 23.23 On OTC vitamin D 2000 units daily.   Review of Systems  Constitutional: Negative for appetite change, fatigue and fever.  HENT: Negative for hearing loss, mouth sores and sore throat.   Eyes: Negative for redness and visual disturbance.  Respiratory: Negative for cough, shortness of breath and wheezing.   Cardiovascular: Negative for chest pain and leg swelling.  Gastrointestinal: Negative for abdominal pain, nausea and vomiting.       No changes in bowel habits.  Endocrine: Negative for cold intolerance, heat intolerance, polydipsia, polyphagia and polyuria.  Genitourinary: Negative for decreased urine volume, dysuria, hematuria, vaginal bleeding and vaginal discharge.   Musculoskeletal: Positive for arthralgias and back pain. Negative for myalgias and neck pain.  Skin: Negative for color change and rash.  Neurological: Negative for syncope, weakness and headaches.  Psychiatric/Behavioral: Negative for confusion and sleep disturbance. The patient is nervous/anxious.   All other systems reviewed and are negative.   Current Outpatient Medications on File Prior to Visit  Medication Sig Dispense Refill  . hydrochlorothiazide (HYDRODIURIL) 25 MG tablet Take 1 tablet (25 mg total) by mouth daily as needed. 90 tablet 3  . meloxicam (MOBIC) 15 MG tablet Take 15 mg by mouth daily.     . methocarbamol (ROBAXIN) 500 MG tablet Take 500 mg by mouth every 6 (six) hours as needed. Take as needed for muscle spasms.     No current facility-administered medications on file prior to visit.      Past Medical History:  Diagnosis Date  . Migraine   . Venous insufficiency     History reviewed. No pertinent surgical history.  No Known Allergies  Family History  Problem Relation Age of Onset  . Diabetes Mother   . AVM Father   . Breast cancer Neg Hx     Social History   Socioeconomic History  . Marital status: Married    Spouse name: Not on file  . Number of children: Not on file  . Years of education: Not on file  . Highest education level: Not on file  Occupational History  . Not on file  Social Needs  . Financial resource strain: Not on file  . Food insecurity:    Worry: Not on file    Inability: Not on file  . Transportation  needs:    Medical: Not on file    Non-medical: Not on file  Tobacco Use  . Smoking status: Never Smoker  . Smokeless tobacco: Never Used  Substance and Sexual Activity  . Alcohol use: No  . Drug use: No  . Sexual activity: Not on file  Lifestyle  . Physical activity:    Days per week: Not on file    Minutes per session: Not on file  . Stress: Not on file  Relationships  . Social connections:    Talks on phone: Not on  file    Gets together: Not on file    Attends religious service: Not on file    Active member of club or organization: Not on file    Attends meetings of clubs or organizations: Not on file    Relationship status: Not on file  Other Topics Concern  . Not on file  Social History Narrative  . Not on file     Vitals:   12/09/18 1010  BP: 118/78  Pulse: 73  Resp: 12  Temp: 98.4 F (36.9 C)  SpO2: 98%   Body mass index is 33.78 kg/m.   Wt Readings from Last 3 Encounters:  12/09/18 189 lb 3.2 oz (85.8 kg)  10/23/18 188 lb 12.8 oz (85.6 kg)  10/15/18 186 lb (84.4 kg)    Physical Exam  Nursing note and vitals reviewed. Constitutional: She is oriented to person, place, and time. She appears well-developed. No distress.  HENT:  Head: Normocephalic and atraumatic.  Right Ear: Hearing, tympanic membrane, external ear and ear canal normal.  Left Ear: Hearing, tympanic membrane, external ear and ear canal normal.  Mouth/Throat: Uvula is midline, oropharynx is clear and moist and mucous membranes are normal.  Eyes: Pupils are equal, round, and reactive to light. Conjunctivae and EOM are normal.  Neck: No tracheal deviation present. No thyromegaly present.  Cardiovascular: Normal rate and regular rhythm.  No murmur heard. Pulses:      Dorsalis pedis pulses are 2+ on the right side and 2+ on the left side.  Respiratory: Effort normal and breath sounds normal. No respiratory distress. There is breast swelling and tenderness.  GI: Soft. She exhibits no mass. There is no hepatomegaly. There is no abdominal tenderness.  Genitourinary:    Genitourinary Comments: Breast: Negative for masses, nipple discharge, or skin changes bilateral.   Musculoskeletal:        General: No edema.     Comments: No major deformity or signs of synovitis appreciated.  Lymphadenopathy:    She has no cervical adenopathy.       Right: No supraclavicular adenopathy present.       Left: No supraclavicular  adenopathy present.  Neurological: She is alert and oriented to person, place, and time. She has normal strength. No cranial nerve deficit. Coordination and gait normal.  Reflex Scores:      Bicep reflexes are 2+ on the right side and 2+ on the left side.      Patellar reflexes are 2+ on the right side and 2+ on the left side. Skin: Skin is warm. No rash noted. No erythema.  Psychiatric: She has a normal mood and affect. Cognition and memory are normal.  Well groomed, good eye contact.    ASSESSMENT AND PLAN:  Ms. DENNY LAVE was here today annual physical examination.   Orders Placed This Encounter  Procedures  . Urinalysis, Routine w reflex microscopic  . VITAMIN D 25 Hydroxy (Vit-D Deficiency,  Fractures)  . Basic metabolic panel  . Lipid panel  . Vitamin B12    Lab Results  Component Value Date   VITAMINB12 595 12/09/2018   Lab Results  Component Value Date   CHOL 216 (H) 12/09/2018   HDL 65.40 12/09/2018   LDLCALC 125 (H) 12/09/2018   LDLDIRECT 150.4 10/21/2013   TRIG 128.0 12/09/2018   CHOLHDL 3 12/09/2018   Lab Results  Component Value Date   CREATININE 0.51 12/09/2018   BUN 16 12/09/2018   NA 140 12/09/2018   K 3.5 12/09/2018   CL 100 12/09/2018   CO2 31 12/09/2018    Routine general medical examination at a health care facility We discussed the importance of regular physical activity and healthy diet for prevention of chronic illness and/or complications. Preventive guidelines reviewed. Vaccination up-to-date  Ca++ and vit D supplementation recommended Next CPE in a year.  Hyperlipidemia Continue low-fat diet. Further recommendation will be given according to lipid panel results.   Bilateral lower extremity edema Problem is well controlled. Continue HCTZ 25 mg daily.  Asymptomatic microscopic hematuria Asymptomatic. Further recommendation will be given according to UA results.  B12 deficiency No changes in vitamin B-12  supplementation. Further recommendation will be given according to B12 levels.  Vitamin D deficiency, unspecified No changes in current management, will follow labs done today and will give further recommendations accordingly.   Screening for endocrine, metabolic and immunity disorder -     Basic metabolic panel     Return in 1 year (on 12/09/2019) for cpe.     Judy Goodenow G. [name], MD  [facility]. [place] office.

## 2018-12-10 ENCOUNTER — Encounter: Payer: Self-pay | Admitting: Family Medicine

## 2018-12-11 MED ORDER — HYDROCHLOROTHIAZIDE 25 MG PO TABS: 25.0000 mg | ORAL_TABLET | Freq: Every day | ORAL | 3 refills | Status: DC | PRN

## 2020-03-18 ENCOUNTER — Other Ambulatory Visit: Payer: Self-pay | Admitting: Family Medicine

## 2020-03-18 DIAGNOSIS — R600 Localized edema: Secondary | ICD-10-CM

## 2020-03-18 NOTE — Telephone Encounter (Signed)
Patient need to schedule an ov for more refills. Pt was due for CPE 11/2019. Pt last appt. 11/2018

## 2020-04-30 ENCOUNTER — Telehealth: Payer: Self-pay | Admitting: Family Medicine

## 2020-04-30 DIAGNOSIS — E538 Deficiency of other specified B group vitamins: Secondary | ICD-10-CM

## 2020-04-30 DIAGNOSIS — E785 Hyperlipidemia, unspecified: Secondary | ICD-10-CM

## 2020-04-30 NOTE — Telephone Encounter (Signed)
Orders are in

## 2020-04-30 NOTE — Telephone Encounter (Signed)
Patient wants to know if she can come in and do her preventative labs at 8 AM on 09/10 then move her appointment to 10:30 AM since we are having a staff meeting that morning.  Please Advise

## 2020-05-03 ENCOUNTER — Other Ambulatory Visit: Payer: No Typology Code available for payment source

## 2020-05-03 NOTE — Patient Instructions (Addendum)
Today you have you routine preventive visit. A few things to remember from today's visit:  Please arrange appt for mammogram and colonoscopy, if referrals are needed me know.  Vitamin D deficiency, unspecified - Plan: VITAMIN D 25 Hydroxy (Vit-D Deficiency, Fractures)  If you need refills please call your pharmacy. Do not use My Chart to request refills or for acute issues that need immediate attention.    Please be sure medication list is accurate. If a new problem present, please set up appointment sooner than planned today.   At least 150 minutes of moderate exercise per week, daily brisk walking for 15-30 min is a good exercise option. Healthy diet low in saturated (animal) fats and sweets and consisting of fresh fruits and vegetables, lean meats such as fish and white chicken and whole grains.  These are some of recommendations for screening depending of age and risk factors:  - Vaccines:  Tdap vaccine every 10 years.  Shingles vaccine recommended at age 41, could be given after 63 years of age but not sure about insurance coverage.   Pneumonia vaccines: Pneumovax at 32. Sometimes Pneumovax is giving earlier if history of smoking, lung disease,diabetes,kidney disease among some.  Screening for diabetes at age 63 and every 3 years.  Cervical cancer prevention:  Pap smear starts at 63 years of age and continues periodically until 63 years old in low risk women. Pap smear every 3 years between 3 and 38 years old. Pap smear every 3-5 years between women 25 and older if pap smear negative and HPV screening negative.   -Breast cancer: Mammogram: There is disagreement between experts about when to start screening in low risk asymptomatic female but recent recommendations are to start screening at 82 and not later than 63 years old , every 1-2 years and after 63 yo q 2 years. Screening is recommended until 63 years old but some women can continue screening depending of healthy  issues.  Colon cancer screening: Has been recently changed to 63 yo. Insurance may not cover until you are 64 years old. Screening is recommended until 63 years old.  Cholesterol disorder screening at age 25 and every 3 years.  Also recommended:  1. Dental visit- Brush and floss your teeth twice daily; visit your dentist twice a year. 2. Eye doctor- Get an eye exam at least every 2 years. 3. Helmet use- Always wear a helmet when riding a bicycle, motorcycle, rollerblading or skateboarding. 4. Safe sex- If you may be exposed to sexually transmitted infections, use a condom. 5. Seat belts- Seat belts can save your live; always wear one. 6. Smoke/Carbon Monoxide detectors- These detectors need to be installed on the appropriate level of your home. Replace batteries at least once a year. 7. Skin cancer- When out in the sun please cover up and use sunscreen 15 SPF or higher. 8. Violence- If anyone is threatening or hurting you, please tell your healthcare provider.  9. Drink alcohol in moderation- Limit alcohol intake to one drink or less per day. Never drink and drive. 10. Calcium supplementation 1000 to 1200 mg daily, ideally through your diet.  Vitamin D supplementation 800 units daily.  If we have ordered labs or studies at this visit, it can take up to 1-2 weeks for results and processing. IF results require follow up or explanation, we will call you with instructions. Clinically stable results will be released to your [facility]. If you have not heard from [place] or cannot find your results in [facility]  in 2 weeks please contact our office at [phone].  If you are not yet signed up for [facility], please consider signing up.

## 2020-05-04 ENCOUNTER — Ambulatory Visit (INDEPENDENT_AMBULATORY_CARE_PROVIDER_SITE_OTHER): Payer: No Typology Code available for payment source | Admitting: Family Medicine

## 2020-05-04 ENCOUNTER — Other Ambulatory Visit: Payer: Self-pay

## 2020-05-04 ENCOUNTER — Other Ambulatory Visit: Payer: No Typology Code available for payment source

## 2020-05-04 ENCOUNTER — Encounter: Payer: 59 | Admitting: Family Medicine

## 2020-05-04 ENCOUNTER — Encounter: Payer: Self-pay | Admitting: Family Medicine

## 2020-05-04 VITALS — BP 128/80 | HR 75 | Temp 98.1°F | Resp 16 | Ht 62.75 in | Wt 190.2 lb

## 2020-05-04 DIAGNOSIS — R600 Localized edema: Secondary | ICD-10-CM

## 2020-05-04 DIAGNOSIS — Z0000 Encounter for general adult medical examination without abnormal findings: Secondary | ICD-10-CM

## 2020-05-04 DIAGNOSIS — E559 Vitamin D deficiency, unspecified: Secondary | ICD-10-CM

## 2020-05-04 DIAGNOSIS — E538 Deficiency of other specified B group vitamins: Secondary | ICD-10-CM | POA: Diagnosis not present

## 2020-05-04 DIAGNOSIS — E785 Hyperlipidemia, unspecified: Secondary | ICD-10-CM

## 2020-05-04 DIAGNOSIS — M7918 Myalgia, other site: Secondary | ICD-10-CM

## 2020-05-04 MED ORDER — HYDROCHLOROTHIAZIDE 25 MG PO TABS: 25.0000 mg | ORAL_TABLET | Freq: Every day | ORAL | 3 refills | Status: DC | PRN

## 2020-05-04 MED ORDER — MELOXICAM 15 MG PO TABS: 15.0000 mg | ORAL_TABLET | Freq: Every day | ORAL | 3 refills | Status: DC | PRN

## 2020-05-04 MED ORDER — METHOCARBAMOL 500 MG PO TABS: 500.0000 mg | ORAL_TABLET | Freq: Two times a day (BID) | ORAL | 3 refills | Status: DC | PRN

## 2020-05-04 NOTE — Assessment & Plan Note (Signed)
Continue HCTZ 25 mg daily as needed. Further recommendation will be given according to BMP results.

## 2020-05-04 NOTE — Assessment & Plan Note (Signed)
Further recommendation according to B12 results.

## 2020-05-04 NOTE — Progress Notes (Addendum)
HPI: Ariel Rios is a 64 y.o. female, who is here today for her routine physical.  Last CPE: 12/09/18.  Regular exercise 3 or more time per week: She has not been consistent with regular physical activity but she is active at work, she has 2 jobs. Following a healthy diet: Not consistently. She lives with her husband.  Chronic medical problems: Vit D def,B12 def,HLD, and LE edema among some.  Pap smear:03/2016 negative, HPV not detected. Hx of abnormal pap smears: Negative.  Immunization History  Administered Date(s) Administered  . Influenza Split 08/29/2011, 06/18/2012  . Influenza,inj,Quad PF,6+ Mos 07/29/2013, 06/08/2014, 06/21/2015, 07/04/2016, 07/23/2018, 07/25/2019  . Influenza-Unspecified 07/23/2018  . Td 09/11/2005  . Tdap 11/17/2014  . Zoster Recombinat (Shingrix) 03/19/2018, 05/21/2018   Mammogram: 03/2017 Bi-Rads 1 Colonoscopy: at age 17. DEXA: N/A  Hep C screening: 04/2017 NR  Concerns today:  She needs refills for muscle relaxant and meloxicam, both she takes as needed and initially prescribed by another provider for knee and musculoskeletal pain.  Hyperlipidemia: Currently she is on nonpharmacologic treatment.  Lab Results  Component Value Date   CHOL 216 (H) 12/09/2018   HDL 65.40 12/09/2018   LDLCALC 125 (H) 12/09/2018   LDLDIRECT 150.4 10/21/2013   TRIG 128.0 12/09/2018   CHOLHDL 3 12/09/2018   Lower extremity edema: Chronic problem, she takes HCTZ 25 mg daily as needed. She has not noted local erythema or skin lesions.  Lab Results  Component Value Date   CREATININE 0.51 12/09/2018   BUN 16 12/09/2018   NA 140 12/09/2018   K 3.5 12/09/2018   CL 100 12/09/2018   CO2 31 12/09/2018   B12 deficiency:Not on B12 supplementation.  Lab Results  Component Value Date   [id number] 12/09/2018   Vitamin D deficiency: She is not on vitamin D supplementation. 25 OH vitamin D on 12/09/2018 was low at 24.  Review of Systems    Constitutional: Negative for appetite change, fatigue and fever.  HENT: Negative for dental problem, hearing loss, mouth sores and sore throat.   Eyes: Negative for redness and visual disturbance.  Respiratory: Negative for cough, shortness of breath and wheezing.   Cardiovascular: Negative for chest pain and palpitations.  Gastrointestinal: Negative for abdominal pain, nausea and vomiting.       No changes in bowel habits.  Endocrine: Negative for cold intolerance, heat intolerance, polydipsia, polyphagia and polyuria.  Genitourinary: Negative for decreased urine volume, dysuria, hematuria, vaginal bleeding and vaginal discharge.  Musculoskeletal: Positive for arthralgias. Negative for gait problem.  Skin: Negative for color change and rash.  Allergic/Immunologic: Negative for environmental allergies.  Neurological: Negative for syncope, weakness and headaches.  Hematological: Negative for adenopathy. Does not bruise/bleed easily.  Psychiatric/Behavioral: Negative for confusion and sleep disturbance. The patient is not nervous/anxious.   All other systems reviewed and are negative.  No current outpatient medications on file prior to visit.   No current facility-administered medications on file prior to visit.   Past Medical History:  Diagnosis Date  . Migraine   . Venous insufficiency    History reviewed. No pertinent surgical history.  No Known Allergies  Family History  Problem Relation Age of Onset  . Diabetes Mother   . AVM Father   . Breast cancer Neg Hx    Social History   Socioeconomic History  . Marital status: Married    Spouse name: Not on file  . Number of children: Not on file  . Years of education:  Not on file  . Highest education level: Not on file  Occupational History  . Not on file  Tobacco Use  . Smoking status: Never Smoker  . Smokeless tobacco: Never Used  Vaping Use  . Vaping Use: Never used  Substance and Sexual Activity  . Alcohol use: No   . Drug use: No  . Sexual activity: Not on file  Other Topics Concern  . Not on file  Social History Narrative  . Not on file   Social Determinants of Health   Financial Resource Strain:   . Difficulty of Paying Living Expenses:   Food Insecurity:   . Worried About Charity fundraiser in the Last Year:   . Arboriculturist in the Last Year:   Transportation Needs:   . Film/video editor (Medical):   [name] Lack of Transportation (Non-Medical):   Physical Activity:   . Days of Exercise per Week:   . Minutes of Exercise per Session:   Stress:   . Feeling of Stress :   Social Connections:   . Frequency of Communication with Friends and Family:   . Frequency of Social Gatherings with Friends and Family:   . Attends Religious Services:   . Active Member of Clubs or Organizations:   . Attends Archivist Meetings:   [name] Marital Status:    Vitals:   05/04/20 1015  BP: 128/80  Pulse: 75  Resp: 16  Temp: 98.1 F (36.7 C)  SpO2: 97%   Body mass index is 33.97 kg/m.  Wt Readings from Last 3 Encounters:  05/04/20 190 lb 4 oz (86.3 kg)  12/09/18 189 lb 3.2 oz (85.8 kg)  10/23/18 188 lb 12.8 oz (85.6 kg)   Physical Exam Vitals and nursing note reviewed.  Constitutional:      General: She is not in acute distress.    Appearance: She is well-developed.  HENT:     Head: Normocephalic and atraumatic.     Comments: Cerumen excess, L>R. Right TN seen partially.    Right Ear: Hearing and external ear normal.     Left Ear: Hearing and external ear normal.     Mouth/Throat:     Mouth: Mucous membranes are moist.     Pharynx: Oropharynx is clear. Uvula midline.  Eyes:     Extraocular Movements: Extraocular movements intact.     Conjunctiva/sclera: Conjunctivae normal.     Pupils: Pupils are equal, round, and reactive to light.  Neck:     Thyroid: No thyromegaly.     Trachea: No tracheal deviation.  Cardiovascular:     Rate and Rhythm: Normal rate and regular rhythm.      Pulses:          Dorsalis pedis pulses are 2+ on the right side and 2+ on the left side.     Heart sounds: No murmur heard.      Comments: Varicose veins in LE,bilateral. Pulmonary:     Effort: Pulmonary effort is normal. No respiratory distress.     Breath sounds: Normal breath sounds.  Abdominal:     Palpations: Abdomen is soft. There is no hepatomegaly or mass.     Tenderness: There is no abdominal tenderness.  Genitourinary:    Comments: Breast: No masses, skin changes, or nipple discharge bilateral. Musculoskeletal:     Comments: No signs of synovitis appreciated.  Lymphadenopathy:     Cervical: No cervical adenopathy.     Upper Body:     Right  upper body: No supraclavicular or axillary adenopathy.     Left upper body: No supraclavicular or axillary adenopathy.  Skin:    General: Skin is warm.     Findings: No erythema or rash.  Neurological:     Mental Status: She is alert and oriented to person, place, and time.     Cranial Nerves: No cranial nerve deficit.     Coordination: Coordination normal.     Gait: Gait normal.     Deep Tendon Reflexes:     Reflex Scores:      Bicep reflexes are 2+ on the right side and 2+ on the left side.      Patellar reflexes are 2+ on the right side and 2+ on the left side. Psychiatric:        Speech: Speech normal.     Comments: Well groomed, good eye contact.   ASSESSMENT AND PLAN:  Ms. MAHARI VANKIRK was here today annual physical examination.  Orders Placed This Encounter  Procedures  . VITAMIN D 25 Hydroxy (Vit-D Deficiency, Fractures)   Lab Results  Component Value Date   CHOL 226 (H) 05/04/2020   HDL 68 05/04/2020   LDLCALC 136 (H) 05/04/2020   LDLDIRECT 150.4 10/21/2013   TRIG 109 05/04/2020   CHOLHDL 3.3 05/04/2020   Lab Results  Component Value Date   VITAMINB12 344 05/04/2020   Lab Results  Component Value Date   WBC 4.9 05/04/2020   HGB 13.4 05/04/2020   HCT 39.8 05/04/2020   MCV 88.8 05/04/2020    PLT 288 05/04/2020   Lab Results  Component Value Date   TSH 4.07 05/04/2020    Routine general medical examination at a health care facility We discussed the importance of regular physical activity and healthy diet for prevention of chronic illness and/or complications. Preventive guidelines reviewed. Vaccination up to date.  Ca++ and vit D supplementation recommended. Next CPE in a year.  The 10-year ASCVD risk score Mikey Bussing DC Brooke Bonito., et al., 2013) is: 5.2%   Values used to calculate the score:     Age: 45 years     Sex: Female     Is Non-Hispanic African American: No     Diabetic: No     Tobacco smoker: No     Systolic Blood Pressure: 563 mmHg     Is BP treated: Yes     HDL Cholesterol: 68 mg/dL     Total Cholesterol: 226 mg/dL  Musculoskeletal pain We discussed some side effects of chronic NSAID's use. As far as she doe snot take medications frequency it is ok to follow annually.  -     methocarbamol (ROBAXIN) 500 MG tablet; Take 1 tablet (500 mg total) by mouth every 12 (twelve) hours as needed. Take as needed for muscle spasms. -     meloxicam (MOBIC) 15 MG tablet; Take 1 tablet (15 mg total) by mouth daily as needed for pain.  Hyperlipidemia Continue nonpharmacologic treatment. Further recommendation will be given according to lipid panel numbers as well as 10 years CVD risk.  Bilateral lower extremity edema Continue HCTZ 25 mg daily as needed. Further recommendation will be given according to BMP results.  Vitamin D deficiency, unspecified She is not on vitamin D supplementation, further recommendation will be given according to 25 OH vitamin D result.  B12 deficiency Further recommendation according to B12 results.   Return in 1 year (on 05/04/2021) for CPE and f/u.   Hancel Ion G. [name], MD  [facility] [place] office.   Today you have you routine preventive visit. A few things to remember from today's visit:  Please arrange appt for mammogram  and colonoscopy, if referrals are needed me know.  Vitamin D deficiency, unspecified - Plan: VITAMIN D 25 Hydroxy (Vit-D Deficiency, Fractures)  If you need refills please call your pharmacy. Do not use My Chart to request refills or for acute issues that need immediate attention.    Please be sure medication list is accurate. If a new problem present, please set up appointment sooner than planned today.   At least 150 minutes of moderate exercise per week, daily brisk walking for 15-30 min is a good exercise option. Healthy diet low in saturated (animal) fats and sweets and consisting of fresh fruits and vegetables, lean meats such as fish and white chicken and whole grains.  These are some of recommendations for screening depending of age and risk factors:  - Vaccines:  Tdap vaccine every 10 years.  Shingles vaccine recommended at age 46, could be given after 63 years of age but not sure about insurance coverage.   Pneumonia vaccines: Pneumovax at 59. Sometimes Pneumovax is giving earlier if history of smoking, lung disease,diabetes,kidney disease among some.  Screening for diabetes at age 63 and every 3 years.  Cervical cancer prevention:  Pap smear starts at 63 years of age and continues periodically until 63 years old in low risk women. Pap smear every 3 years between 22 and 32 years old. Pap smear every 3-5 years between women 22 and older if pap smear negative and HPV screening negative.   -Breast cancer: Mammogram: There is disagreement between experts about when to start screening in low risk asymptomatic female but recent recommendations are to start screening at 13 and not later than 63 years old , every 1-2 years and after 63 yo q 2 years. Screening is recommended until 63 years old but some women can continue screening depending of healthy issues.  Colon cancer screening: Has been recently changed to 63 yo. Insurance may not cover until you are 63 years old. Screening  is recommended until 63 years old.  Cholesterol disorder screening at age 4 and every 3 years.N/A  Also recommended:  1. Dental visit- Brush and floss your teeth twice daily; visit your dentist twice a year. 2. Eye doctor- Get an eye exam at least every 2 years. 3. Helmet use- Always wear a helmet when riding a bicycle, motorcycle, rollerblading or skateboarding. 4. Safe sex- If you may be exposed to sexually transmitted infections, use a condom. 5. Seat belts- Seat belts can save your live; always wear one. 6. Smoke/Carbon Monoxide detectors- These detectors need to be installed on the appropriate level of your home. Replace batteries at least once a year. 7. Skin cancer- When out in the sun please cover up and use sunscreen 15 SPF or higher. 8. Violence- If anyone is threatening or hurting you, please tell your healthcare provider.  9. Drink alcohol in moderation- Limit alcohol intake to one drink or less per day. Never drink and drive. 10. Calcium supplementation 1000 to 1200 mg daily, ideally through your diet.  Vitamin D supplementation 800 units daily.  If we have ordered labs or studies at this visit, it can take up to 1-2 weeks for results and processing. IF results require follow up or explanation, we will call you with instructions. Clinically stable results will be released to your [facility] If you have not heard from [place] or  cannot find your results in [facility] in 2 weeks please contact our office at [phone].  If you are not yet signed up for [facility], please consider signing up.

## 2020-05-04 NOTE — Assessment & Plan Note (Signed)
Continue nonpharmacologic treatment. Further recommendation will be given according to lipid panel numbers as well as 10 years CVD risk.

## 2020-05-04 NOTE — Assessment & Plan Note (Signed)
She is not on vitamin D supplementation, further recommendation will be given according to 25 OH vitamin D result.

## 2020-05-05 LAB — CBC WITH DIFFERENTIAL/PLATELET
Absolute Monocytes: 309 cells/uL (ref 200–950)
Basophils Absolute: 29 cells/uL (ref 0–200)
Basophils Relative: 0.6 %
Eosinophils Absolute: 118 cells/uL (ref 15–500)
Eosinophils Relative: 2.4 %
HCT: 39.8 % (ref 35.0–45.0)
Hemoglobin: 13.4 g/dL (ref 11.7–15.5)
Lymphs Abs: 1661 cells/uL (ref 850–3900)
MCH: 29.9 pg (ref 27.0–33.0)
MCHC: 33.7 g/dL (ref 32.0–36.0)
MCV: 88.8 fL (ref 80.0–100.0)
MPV: 9.5 fL (ref 7.5–12.5)
Monocytes Relative: 6.3 %
Neutro Abs: 2783 cells/uL (ref 1500–7800)
Neutrophils Relative %: 56.8 %
Platelets: 288 10*3/uL (ref 140–400)
RBC: 4.48 10*6/uL (ref 3.80–5.10)
RDW: 13 % (ref 11.0–15.0)
Total Lymphocyte: 33.9 %
WBC: 4.9 10*3/uL (ref 3.8–10.8)

## 2020-05-05 LAB — BASIC METABOLIC PANEL
BUN: 12 mg/dL (ref 7–25)
CO2: 24 mmol/L (ref 20–32)
Calcium: 9.5 mg/dL (ref 8.6–10.4)
Chloride: 99 mmol/L (ref 98–110)
Creat: 0.63 mg/dL (ref 0.50–0.99)
Glucose, Bld: 80 mg/dL (ref 65–99)
Potassium: 3.7 mmol/L (ref 3.5–5.3)
Sodium: 139 mmol/L (ref 135–146)

## 2020-05-05 LAB — LIPID PANEL
Cholesterol: 226 mg/dL — ABNORMAL HIGH (ref <200)
HDL: 68 mg/dL (ref >=50)
LDL Cholesterol (Calc): 136 mg/dL (calc) — ABNORMAL HIGH
Non-HDL Cholesterol (Calc): 158 mg/dL (calc) — ABNORMAL HIGH (ref <130)
Total CHOL/HDL Ratio: 3.3 (calc) (ref <5.0)
Triglycerides: 109 mg/dL (ref <150)

## 2020-05-05 LAB — TSH: TSH: 4.07 mIU/L (ref 0.40–4.50)

## 2020-05-05 LAB — VITAMIN B12: Vitamin B-12: 344 pg/mL (ref 200–1100)

## 2020-05-05 LAB — VITAMIN D 25 HYDROXY (VIT D DEFICIENCY, FRACTURES): Vit D, 25-Hydroxy: 22 ng/mL — ABNORMAL LOW (ref 30–100)

## 2020-05-06 ENCOUNTER — Encounter: Payer: Self-pay | Admitting: Family Medicine

## 2020-07-21 ENCOUNTER — Telehealth: Payer: Self-pay

## 2020-07-21 NOTE — Telephone Encounter (Signed)
New message    The patient went to urgent care on 10.15.2021 overall body ache & covid test negative Was prescribe a 9 days of presidone ? Mg asking can MD prescribe her to help with the pain .   [facility]

## 2020-07-21 NOTE — Telephone Encounter (Signed)
Pt is going to call the urgent care and see if they can extend her medication until she sees pcp on Monday.

## 2020-07-21 NOTE — Telephone Encounter (Signed)
Predisone 10 MG

## 2020-07-26 ENCOUNTER — Ambulatory Visit: Payer: No Typology Code available for payment source | Admitting: Family Medicine

## 2020-07-26 ENCOUNTER — Encounter: Payer: Self-pay | Admitting: Family Medicine

## 2020-07-26 ENCOUNTER — Other Ambulatory Visit: Payer: Self-pay

## 2020-07-26 VITALS — BP 124/80 | HR 73 | Temp 98.1°F | Resp 16 | Ht 62.75 in | Wt 185.2 lb

## 2020-07-26 DIAGNOSIS — Z23 Encounter for immunization: Secondary | ICD-10-CM

## 2020-07-26 DIAGNOSIS — M2550 Pain in unspecified joint: Secondary | ICD-10-CM | POA: Diagnosis not present

## 2020-07-26 DIAGNOSIS — H8110 Benign paroxysmal vertigo, unspecified ear: Secondary | ICD-10-CM | POA: Diagnosis not present

## 2020-07-26 DIAGNOSIS — M353 Polymyalgia rheumatica: Secondary | ICD-10-CM | POA: Diagnosis not present

## 2020-07-26 NOTE — Patient Instructions (Addendum)
A few things to remember from today's visit:  Polymyalgia Rheumatica Polymyalgia rheumatica (PMR) is an inflammatory disorder that causes the muscles and joints to ache and become stiff. Sometimes, PMR leads to a more dangerous condition that can cause vision loss (temporal arteritis or giant cell arteritis). What are the causes? The exact cause of PMR is not known. What increases the risk? You are more likely to develop this condition if you are:  Female.  5 years of age or older.  Caucasian. What are the signs or symptoms? Pain and stiffness are the main symptoms of PMR. Symptoms may:  Be worse after inactivity and in the morning.  Affect your: ? Hips, buttocks, and thighs. ? Neck, arms, and shoulders. This can make it hard to raise your arms above your head. ? Hands and wrists. Other symptoms include:  Fever.  Tiredness.  Weakness.  Depression.  Decreased appetite. This may lead to weight loss. Symptoms may start slowly or suddenly. How is this diagnosed? This condition is diagnosed with your medical history and a physical exam. You may need to see a health care provider who specializes in diseases of the joints, muscles, and bones (rheumatologist). You may also have tests, including:  Blood tests.  X-rays.  Ultrasound. How is this treated? PMR usually goes away without treatment, but it may take years. Your health care provider may recommend low-dose steroids and other medicines to help manage your symptoms of pain and stiffness. Regular exercise and rest will also help your symptoms. Follow these instructions at home:   Take over-the-counter and prescription medicines only as told by your health care provider.  Make sure to get enough rest and sleep.  Eat a healthy and nutritious diet.  Try to exercise most days of the week. Ask your health care provider what type of exercise is best for you.  Keep all follow-up visits as told by your health care  provider. This is important. Contact a health care provider if:  Your symptoms do not improve with medicine.  You have side effects from steroids. These may include: ? Weight gain. ? Swelling. ? Insomnia. ? Mood changes. ? Bruising. ? High blood sugar readings, if you have diabetes. ? Higher than normal blood pressure readings, if you monitor your blood pressure. Get help right away if:  You develop symptoms of temporal arteritis, such as: ? A change in vision. ? Severe headache. ? Scalp pain. ? Jaw pain. Summary  Polymyalgia rheumatica is an inflammatory disorder that causes aching and stiffness in your muscles and joints.  The exact cause of this condition is not known.  This condition usually goes away without treatment. Your health care provider may give you low-dose steroids to help manage your pain and stiffness.  Rest and regular exercise will help the symptoms. This information is not intended to replace advice given to you by your health care provider. Make sure you discuss any questions you have with your health care provider. Document Revised: 07/18/2018 Document Reviewed: 07/18/2018 Elsevier Patient Education  [employer].  If you need refills please call your pharmacy. Do not use My Chart to request refills or for acute issues that need immediate attention.    Please be sure medication list is accurate. If a new problem present, please set up appointment sooner than planned today.

## 2020-07-26 NOTE — Progress Notes (Signed)
Chief Complaint  Patient presents with  . Joint Pain   HPI: [name] is a 64 y.o. female, who is here today complaining of bilateral groin and shoulder pain. This is a new problem.  About 6 weeks ago she had an episode of "bad" vertigo, her typical symptoms, lasted about 2 days.  She took vertigo medication and symptoms have resolved. A week later she started with joint pain. She wonders if joint pain is related to recent episode of vertigo  She was having difficulty getting up and pulling herself up from a chair or getting off from her car. Felt like she had "no strength."  Bilateral shoulder pain, right side occasionally radiates to right upper extremity down to right index (mild). Difficulty with activities that involve movement at or above head level.  She is right-handed. No history of trauma or unusual physical activity. Pain 8/10, intermittent, exacerbated by movement.  She was evaluated in a local acute care facility, [facility] on 07/[facility]/20.  Because recent insect bite, she was tested for [facility] spotted fever and Lyme disease and according to patient these were negative. She was treated with prednisone which "it was like a miracle", pain resolved. She took prednisone for 10 days, symptoms gradually coming back after completed treatment. Last time she took prednisone was a week ago.  Initially she was taking Tylenol.  Negative for fever, chills, sore throat, changes in hearing, headache, numbness, tingling, or focal neurologic deficit. + Fatigue. Pain is interfering with sleep.  Review of Systems  Constitutional: Positive for activity change. Negative for appetite change and unexpected weight change.  HENT: Negative for mouth sores and nosebleeds.   Gastrointestinal: Negative for abdominal pain, nausea and vomiting.  Musculoskeletal: Positive for neck pain. Negative for gait problem and joint swelling.  Skin: Negative for color change and pallor.    Neurological: Negative for syncope and facial asymmetry.  Psychiatric/Behavioral: Positive for sleep disturbance. Negative for confusion.  Rest see pertinent positives and negatives per HPI.  Current Outpatient Medications on File Prior to Visit  Medication Sig Dispense Refill  . hydrochlorothiazide (HYDRODIURIL) 25 MG tablet Take 1 tablet (25 mg total) by mouth daily as needed. 90 tablet 3  . meloxicam (MOBIC) 15 MG tablet Take 1 tablet (15 mg total) by mouth daily as needed for pain. 30 tablet 3  . methocarbamol (ROBAXIN) 500 MG tablet Take 1 tablet (500 mg total) by mouth every 12 (twelve) hours as needed. Take as needed for muscle spasms. 60 tablet 3   No current facility-administered medications on file prior to visit.   Past Medical History:  Diagnosis Date  . Migraine   . Venous insufficiency    No Known Allergies  Social History   Socioeconomic History  . Marital status: Married    Spouse name: Not on file  . Number of children: Not on file  . Years of education: Not on file  . Highest education level: Not on file  Occupational History  . Not on file  Tobacco Use  . Smoking status: Never Smoker  . Smokeless tobacco: Never Used  Vaping Use  . Vaping Use: Never used  Substance and Sexual Activity  . Alcohol use: No  . Drug use: No  . Sexual activity: Not on file  Other Topics Concern  . Not on file  Social History Narrative  . Not on file   Social Determinants of Health   Financial Resource Strain:   . Difficulty of Paying Living  Expenses: Not on file  Food Insecurity:   . Worried About Charity fundraiser in the Last Year: Not on file  . Ran Out of Food in the Last Year: Not on file  Transportation Needs:   . Lack of Transportation (Medical): Not on file  . Lack of Transportation (Non-Medical): Not on file  Physical Activity:   . Days of Exercise per Week: Not on file  . Minutes of Exercise per Session: Not on file  Stress:   . Feeling of Stress : Not  on file  Social Connections:   . Frequency of Communication with Friends and Family: Not on file  . Frequency of Social Gatherings with Friends and Family: Not on file  . Attends Religious Services: Not on file  . Active Member of Clubs or Organizations: Not on file  . Attends Archivist Meetings: Not on file  . Marital Status: Not on file   Vitals:   07/26/20 1136  BP: 124/80  Pulse: 73  Resp: 16  Temp: 98.1 F (36.7 C)  SpO2: 98%   Body mass index is 33.08 kg/m.  Physical Exam Vitals and nursing note reviewed.  Constitutional:      General: She is not in acute distress.    Appearance: She is well-developed. She is not ill-appearing.  HENT:     Head: Normocephalic and atraumatic.     Comments: There is no tenderness with temples palpation. Eyes:     Conjunctiva/sclera: Conjunctivae normal.  Cardiovascular:     Rate and Rhythm: Normal rate and regular rhythm.     Heart sounds: No murmur heard.   Pulmonary:     Effort: Pulmonary effort is normal. No respiratory distress.     Breath sounds: Normal breath sounds.  Musculoskeletal:     Right shoulder: Normal range of motion.     Left shoulder: Normal range of motion.     Right hip: No tenderness or bony tenderness.     Left hip: No tenderness or bony tenderness.     Comments: No signs of synovitis.  Lymphadenopathy:     Cervical: No cervical adenopathy.  Skin:    General: Skin is warm.     Findings: No erythema or rash.  Neurological:     General: No focal deficit present.     Mental Status: She is alert and oriented to person, place, and time.     Cranial Nerves: Cranial nerves are intact.     Motor: No tremor.     Gait: Gait normal.  Psychiatric:        Mood and Affect: Mood and affect normal.    ASSESSMENT AND PLAN:  [name] was seen today for joint pain.  Diagnoses and all orders for this visit: Orders Placed This Encounter  Procedures  . Flu Vaccine QUAD 36+ mos IM  . Sedimentation rate    . CBC with Differential/Platelet  . C-reactive protein  . ANA  . Cyclic citrul peptide antibody, IgG  . Rheumatoid factor   Lab Results  Component Value Date   ESRSEDRATE 45 (H) 07/26/2020   Lab Results  Component Value Date   CRP 54.7 (H) 07/26/2020   Lab Results  Component Value Date   WBC 5.3 07/26/2020   HGB 11.8 07/26/2020   HCT 35.5 07/26/2020   MCV 89.0 07/26/2020   PLT 305 07/26/2020   Polyarthralgia Mainly affecting shoulders and hips. Hx suggest PMR. We discussed diagnosis, prognosis, and treatment options. We reviewed some side effects  of long-term prednisone. Further recommendation will be given according to lab results.  Benign paroxysmal positional vertigo, unspecified laterality She is not symptomatic at this time. Symptoms she described suggest her typical positional vertigo. Fall precautions.  PMR Will recommend starting prednisone 40 mg daily, we can try to decrease dose to 20 mg in a couple weeks. Follow-up in 3 to 4 weeks. We will repeat sed rate and CRP in a few weeks.  Need for influenza vaccination -     Flu Vaccine QUAD 36+ mos IM   Return if symptoms worsen or fail to improve.   Josuha Fontanez G. [name], MD  [facility] [place] office.   A few things to remember from today's visit:  Polymyalgia Rheumatica Polymyalgia rheumatica (PMR) is an inflammatory disorder that causes the muscles and joints to ache and become stiff. Sometimes, PMR leads to a more dangerous condition that can cause vision loss (temporal arteritis or giant cell arteritis). What are the causes? The exact cause of PMR is not known. What increases the risk? You are more likely to develop this condition if you are:  Female.  20 years of age or older.  Caucasian. What are the signs or symptoms? Pain and stiffness are the main symptoms of PMR. Symptoms may:  Be worse after inactivity and in the morning.  Affect your: ? Hips, buttocks, and  thighs. ? Neck, arms, and shoulders. This can make it hard to raise your arms above your head. ? Hands and wrists. Other symptoms include:  Fever.  Tiredness.  Weakness.  Depression.  Decreased appetite. This may lead to weight loss. Symptoms may start slowly or suddenly. How is this diagnosed? This condition is diagnosed with your medical history and a physical exam. You may need to see a health care provider who specializes in diseases of the joints, muscles, and bones (rheumatologist). You may also have tests, including:  Blood tests.  X-rays.  Ultrasound. How is this treated? PMR usually goes away without treatment, but it may take years. Your health care provider may recommend low-dose steroids and other medicines to help manage your symptoms of pain and stiffness. Regular exercise and rest will also help your symptoms. Follow these instructions at home:   Take over-the-counter and prescription medicines only as told by your health care provider.  Make sure to get enough rest and sleep.  Eat a healthy and nutritious diet.  Try to exercise most days of the week. Ask your health care provider what type of exercise is best for you.  Keep all follow-up visits as told by your health care provider. This is important. Contact a health care provider if:  Your symptoms do not improve with medicine.  You have side effects from steroids. These may include: ? Weight gain. ? Swelling. ? Insomnia. ? Mood changes. ? Bruising. ? High blood sugar readings, if you have diabetes. ? Higher than normal blood pressure readings, if you monitor your blood pressure. Get help right away if:  You develop symptoms of temporal arteritis, such as: ? A change in vision. ? Severe headache. ? Scalp pain. ? Jaw pain. Summary  Polymyalgia rheumatica is an inflammatory disorder that causes aching and stiffness in your muscles and joints.  The exact cause of this condition is not  known.  This condition usually goes away without treatment. Your health care provider may give you low-dose steroids to help manage your pain and stiffness.  Rest and regular exercise will help the symptoms. This information is  not intended to replace advice given to you by your health care provider. Make sure you discuss any questions you have with your health care provider. Document Revised: 07/18/2018 Document Reviewed: 07/18/2018 Elsevier Patient Education  [employer].  If you need refills please call your pharmacy. Do not use My Chart to request refills or for acute issues that need immediate attention.    Please be sure medication list is accurate. If a new problem present, please set up appointment sooner than planned today.

## 2020-07-27 LAB — C-REACTIVE PROTEIN: CRP: 54.7 mg/L — ABNORMAL HIGH (ref <8.0)

## 2020-07-27 LAB — CBC WITH DIFFERENTIAL/PLATELET
Absolute Monocytes: 360 cells/uL (ref 200–950)
Basophils Absolute: 32 cells/uL (ref 0–200)
Basophils Relative: 0.6 %
Eosinophils Absolute: 101 cells/uL (ref 15–500)
Eosinophils Relative: 1.9 %
HCT: 35.5 % (ref 35.0–45.0)
Hemoglobin: 11.8 g/dL (ref 11.7–15.5)
Lymphs Abs: 1521 cells/uL (ref 850–3900)
MCH: 29.6 pg (ref 27.0–33.0)
MCHC: 33.2 g/dL (ref 32.0–36.0)
MCV: 89 fL (ref 80.0–100.0)
MPV: 9.2 fL (ref 7.5–12.5)
Monocytes Relative: 6.8 %
Neutro Abs: 3286 cells/uL (ref 1500–7800)
Neutrophils Relative %: 62 %
Platelets: 305 10*3/uL (ref 140–400)
RBC: 3.99 10*6/uL (ref 3.80–5.10)
RDW: 13 % (ref 11.0–15.0)
Total Lymphocyte: 28.7 %
WBC: 5.3 10*3/uL (ref 3.8–10.8)

## 2020-07-27 LAB — SEDIMENTATION RATE: Sed Rate: 45 mm/h — ABNORMAL HIGH (ref 0–30)

## 2020-07-27 LAB — RHEUMATOID FACTOR: Rheumatoid fact SerPl-aCnc: <14 IU/mL (ref <14)

## 2020-07-27 LAB — ANA: Anti Nuclear Antibody (ANA): NEGATIVE

## 2020-07-27 LAB — CYCLIC CITRUL PEPTIDE ANTIBODY, IGG: Cyclic Citrullin Peptide Ab: <16 UNITS

## 2020-07-27 MED ORDER — PREDNISONE 20 MG PO TABS: 40.0000 mg | ORAL_TABLET | Freq: Every day | ORAL | 0 refills | Status: DC

## 2020-08-18 ENCOUNTER — Other Ambulatory Visit: Payer: Self-pay | Admitting: Family Medicine

## 2020-08-18 DIAGNOSIS — M353 Polymyalgia rheumatica: Secondary | ICD-10-CM

## 2020-08-18 NOTE — Telephone Encounter (Signed)
Has f/u scheduled for Monday.

## 2020-08-23 ENCOUNTER — Ambulatory Visit: Payer: No Typology Code available for payment source | Admitting: Family Medicine

## 2020-08-23 ENCOUNTER — Other Ambulatory Visit: Payer: Self-pay

## 2020-08-23 ENCOUNTER — Encounter: Payer: Self-pay | Admitting: Family Medicine

## 2020-08-23 VITALS — BP 120/76 | HR 84 | Temp 98.4°F | Resp 16 | Ht 62.0 in | Wt 189.4 lb

## 2020-08-23 DIAGNOSIS — Z780 Asymptomatic menopausal state: Secondary | ICD-10-CM

## 2020-08-23 DIAGNOSIS — E669 Obesity, unspecified: Secondary | ICD-10-CM

## 2020-08-23 DIAGNOSIS — M353 Polymyalgia rheumatica: Secondary | ICD-10-CM | POA: Diagnosis not present

## 2020-08-23 DIAGNOSIS — Z6834 Body mass index (BMI) 34.0-34.9, adult: Secondary | ICD-10-CM | POA: Diagnosis not present

## 2020-08-23 DIAGNOSIS — E66811 Obesity, class 1: Secondary | ICD-10-CM | POA: Insufficient documentation

## 2020-08-23 MED ORDER — PREDNISONE 10 MG PO TABS: 10.0000 mg | ORAL_TABLET | Freq: Every day | ORAL | 1 refills | Status: DC

## 2020-08-23 NOTE — Patient Instructions (Addendum)
A few things to remember from today's visit:   PMR (polymyalgia rheumatica) ([place]) - Plan: C-reactive protein, Sedimentation rate, BASIC METABOLIC PANEL WITH GFR, predniSONE (DELTASONE) 10 MG tablet  If you need refills please call your pharmacy. Do not use My Chart to request refills or for acute issues that need immediate attention.   Try decreasing dose of Prednisone from 40 mg to 30 mg daily. In 2-3 weeks try decreasing to 20 mg. If pain comes back go back to prior dose. Calcium 1200 mg daily and vit D 800 U.   Please be sure medication list is accurate. If a new problem present, please set up appointment sooner than planned today.

## 2020-08-23 NOTE — Progress Notes (Signed)
HPI: [name] is a 64 y.o. female, who is here today to follow on recent OV. She was seen on 07/26/20, when she was c/o bilateral shoulder pain, difficulty pulling herself up from sitting position and fatigue. She was started on Prednisone 40 gm daily. Elevated CRP and Sed rate, rest of rheumatologic work-up negative. She has not noted joint edema or erythema.  100% better 1-2 days after starting Prednisone. In general medication has been well tolerated, initially she had frontal headache, resolved. Negative for temporal headache,visual changes, or skin rash.  CRP 54.7 and sed rate 45.  Lab Results  Component Value Date   WBC 5.3 07/26/2020   HGB 11.8 07/26/2020   HCT 35.5 07/26/2020   MCV 89.0 07/26/2020   PLT 305 07/26/2020   She has not been consistently with following a healthful diet or exercising regularly. She works 2 jobs, stays active.  Review of Systems  Constitutional: Negative for chills and fever.  HENT: Negative for mouth sores and nosebleeds.   Respiratory: Negative for cough, shortness of breath and wheezing.   Cardiovascular: Negative for chest pain, palpitations and leg swelling.  Gastrointestinal: Negative for abdominal pain, nausea and vomiting.       Negative for changes in bowel habits.  Genitourinary: Negative for decreased urine volume and hematuria.  Musculoskeletal: Negative for gait problem and myalgias.  Neurological: Negative for syncope, facial asymmetry and weakness.  Rest see pertinent positives and negatives per HPI.  Current Outpatient Medications on File Prior to Visit  Medication Sig Dispense Refill   hydrochlorothiazide (HYDRODIURIL) 25 MG tablet Take 1 tablet (25 mg total) by mouth daily as needed. 90 tablet 3   meloxicam (MOBIC) 15 MG tablet Take 1 tablet (15 mg total) by mouth daily as needed for pain. 30 tablet 3   methocarbamol (ROBAXIN) 500 MG tablet Take 1 tablet (500 mg total) by mouth every 12 (twelve) hours as  needed. Take as needed for muscle spasms. 60 tablet 3   predniSONE (DELTASONE) 20 MG tablet TAKE 2 TABLETS BY MOUTH DAILY WITH BREAKFAST. 40 tablet 0   No current facility-administered medications on file prior to visit.   Past Medical History:  Diagnosis Date   Migraine    Venous insufficiency    No Known Allergies  Social History   Socioeconomic History   Marital status: Married    Spouse name: Not on file   Number of children: Not on file   Years of education: Not on file   Highest education level: Not on file  Occupational History   Not on file  Tobacco Use   Smoking status: Never Smoker   Smokeless tobacco: Never Used  Vaping Use   Vaping Use: Never used  Substance and Sexual Activity   Alcohol use: No   Drug use: No   Sexual activity: Not on file  Other Topics Concern   Not on file  Social History Narrative   Not on file   Social Determinants of Health   Financial Resource Strain:    Difficulty of Paying Living Expenses: Not on file  Food Insecurity:    Worried About [place] in the Last Year: Not on file   Ran Out of Food in the Last Year: Not on file  Transportation Needs:    Lack of Transportation (Medical): Not on file   Lack of Transportation (Non-Medical): Not on file  Physical Activity:    Days of Exercise per Week: Not on file  Minutes of Exercise per Session: Not on file  Stress:    Feeling of Stress : Not on file  Social Connections:    Frequency of Communication with Friends and Family: Not on file   Frequency of Social Gatherings with Friends and Family: Not on file   Attends Religious Services: Not on file   Active Member of Clubs or Organizations: Not on file   Attends Club or Organization Meetings: Not on file   Marital Status: Not on file    Vitals:   08/23/20 0954  BP: 120/76  Pulse: 84  Resp: 16  Temp: 98.4 F (36.9 C)  SpO2: 97%   Wt Readings from Last 3 Encounters:  08/23/20 189  lb 6.4 oz (85.9 kg)  07/26/20 185 lb 4 oz (84 kg)  05/04/20 190 lb 4 oz (86.3 kg)   Body mass index is 34.64 kg/m.  Physical Exam Vitals and nursing note reviewed.  Constitutional:      General: She is not in acute distress.    Appearance: She is well-developed.  HENT:     Head: Normocephalic and atraumatic.  Eyes:     Conjunctiva/sclera: Conjunctivae normal.     Pupils: Pupils are equal, round, and reactive to light.  Cardiovascular:     Rate and Rhythm: Normal rate and regular rhythm.     Heart sounds: No murmur heard.   Pulmonary:     Effort: Pulmonary effort is normal. No respiratory distress.     Breath sounds: Normal breath sounds.  Abdominal:     Palpations: Abdomen is soft.     Tenderness: There is no abdominal tenderness.  Musculoskeletal:     Right shoulder: No deformity, tenderness or bony tenderness. Normal range of motion.     Left shoulder: No deformity, tenderness or bony tenderness. Normal range of motion.     Right lower leg: No edema.     Left lower leg: No edema.  Lymphadenopathy:     Cervical: No cervical adenopathy.  Skin:    General: Skin is warm.     Findings: No erythema or rash.  Neurological:     General: No focal deficit present.     Mental Status: She is alert and oriented to person, place, and time.     Cranial Nerves: No cranial nerve deficit.     Gait: Gait normal.  Psychiatric:     Comments: Well groomed, good eye contact.   ASSESSMENT AND PLAN:  [name] was seen today for follow-up.  Diagnoses and all orders for this visit: Orders Placed This Encounter  Procedures   DG Bone Density   BASIC METABOLIC PANEL WITH GFR   Sedimentation rate   C-reactive protein   Lab Results  Component Value Date   CRP 3.6 08/23/2020   Lab Results  Component Value Date   ESRSEDRATE 2 08/23/2020   Lab Results  Component Value Date   CREATININE 0.82 08/23/2020   BUN 23 08/23/2020   NA 140 08/23/2020   K 3.3 (L) 08/23/2020   CL 100  08/23/2020   CO2 29 08/23/2020   PMR (polymyalgia rheumatica) ([facility]) We reviewed Dx,prognosis,and treatment. She will try to decrease Prednisone from 40 mg to 30 mg and 2-3 weeks later to 20 mg. If symptoms reocur she was instructed to go back to prior dose. We discussed some side effects of prednisone. She does not want to see rheumatologist at this time.  -     predniSONE (DELTASONE) 10 MG tablet; Take 1 tablet (  10 mg total) by mouth daily with breakfast. To take with 20 mg tab for a total of 30 mg.  Asymptomatic postmenopausal estrogen deficiency We discussed recommendations in regard to osteoporosis screening and effects of Prednisone on bone density. Ca++ and vit D supplementation recommended. Fall prevention.  -     DG Bone Density; Future  Class 1 obesity with serious comorbidity and body mass index (BMI) of 34.0 to 34.9 in adult, unspecified obesity type She gained about 4 Lbs since Prednisone was started. We will continue following. Encouraged consistently with following a healthful diet and low impact physical activity.  Return in about 3 months (around 11/22/2020) for PMR and wt.   Celestine Prim G. [name], MD  [facility]. [place] office.  A few things to remember from today's visit:   PMR (polymyalgia rheumatica) ([place]) - Plan: C-reactive protein, Sedimentation rate, BASIC METABOLIC PANEL WITH GFR, predniSONE (DELTASONE) 10 MG tablet  If you need refills please call your pharmacy. Do not use My Chart to request refills or for acute issues that need immediate attention.   Try decreasing dose of Prednisone from 40 mg to 30 mg daily. In 2-3 weeks try decreasing to 20 mg. If pain comes back go back to prior dose. Calcium 1200 mg daily and vit D 800 U.   Please be sure medication list is accurate. If a new problem present, please set up appointment sooner than planned today.

## 2020-08-24 ENCOUNTER — Encounter: Payer: Self-pay | Admitting: Family Medicine

## 2020-08-24 LAB — BASIC METABOLIC PANEL WITH GFR
BUN: 23 mg/dL (ref 7–25)
CO2: 29 mmol/L (ref 20–32)
Calcium: 9.9 mg/dL (ref 8.6–10.4)
Chloride: 100 mmol/L (ref 98–110)
Creat: 0.82 mg/dL (ref 0.50–0.99)
GFR, Est African American: 89 mL/min/{1.73_m2} (ref >=60)
GFR, Est Non African American: 77 mL/min/{1.73_m2} (ref >=60)
Glucose, Bld: 79 mg/dL (ref 65–99)
Potassium: 3.3 mmol/L — ABNORMAL LOW (ref 3.5–5.3)
Sodium: 140 mmol/L (ref 135–146)

## 2020-08-24 LAB — SEDIMENTATION RATE: Sed Rate: 2 mm/h (ref 0–30)

## 2020-08-24 LAB — C-REACTIVE PROTEIN: CRP: 3.6 mg/L (ref <8.0)

## 2020-10-15 ENCOUNTER — Other Ambulatory Visit: Payer: Self-pay | Admitting: Family Medicine

## 2020-10-15 DIAGNOSIS — M353 Polymyalgia rheumatica: Secondary | ICD-10-CM

## 2020-11-09 ENCOUNTER — Other Ambulatory Visit: Payer: Self-pay | Admitting: Family Medicine

## 2020-11-09 DIAGNOSIS — M353 Polymyalgia rheumatica: Secondary | ICD-10-CM

## 2020-11-14 DIAGNOSIS — M353 Polymyalgia rheumatica: Secondary | ICD-10-CM | POA: Insufficient documentation

## 2020-11-15 ENCOUNTER — Encounter: Payer: Self-pay | Admitting: Family Medicine

## 2020-11-15 ENCOUNTER — Other Ambulatory Visit: Payer: Self-pay

## 2020-11-15 ENCOUNTER — Ambulatory Visit: Payer: No Typology Code available for payment source | Admitting: Family Medicine

## 2020-11-15 VITALS — BP 124/70 | HR 70 | Temp 97.7°F | Resp 16 | Ht 62.0 in | Wt 190.2 lb

## 2020-11-15 DIAGNOSIS — Z780 Asymptomatic menopausal state: Secondary | ICD-10-CM

## 2020-11-15 DIAGNOSIS — Z6834 Body mass index (BMI) 34.0-34.9, adult: Secondary | ICD-10-CM

## 2020-11-15 DIAGNOSIS — N9089 Other specified noninflammatory disorders of vulva and perineum: Secondary | ICD-10-CM | POA: Diagnosis not present

## 2020-11-15 DIAGNOSIS — M353 Polymyalgia rheumatica: Secondary | ICD-10-CM | POA: Diagnosis not present

## 2020-11-15 DIAGNOSIS — E669 Obesity, unspecified: Secondary | ICD-10-CM | POA: Diagnosis not present

## 2020-11-15 LAB — BASIC METABOLIC PANEL
BUN: 17 mg/dL (ref 6–23)
CO2: 31 mEq/L (ref 19–32)
Calcium: 9.7 mg/dL (ref 8.4–10.5)
Chloride: 101 mEq/L (ref 96–112)
Creatinine, Ser: 0.63 mg/dL (ref 0.40–1.20)
GFR: 94.57 mL/min (ref >60.00)
Glucose, Bld: 78 mg/dL (ref 70–99)
Potassium: 3.5 mEq/L (ref 3.5–5.1)
Sodium: 142 mEq/L (ref 135–145)

## 2020-11-15 LAB — C-REACTIVE PROTEIN: CRP: <1 mg/dL (ref 0.5–20.0)

## 2020-11-15 LAB — SEDIMENTATION RATE: Sed Rate: 19 mm/hr (ref 0–30)

## 2020-11-15 MED ORDER — PREDNISONE 10 MG PO TABS: 10.0000 mg | ORAL_TABLET | Freq: Every day | ORAL | 0 refills | Status: DC

## 2020-11-15 NOTE — Assessment & Plan Note (Signed)
Wt has been stable since her last visit. She understands the benefits of wt loss as well as adverse effects of obesity. Consistency with healthy diet and physical activity recommended.

## 2020-11-15 NOTE — Assessment & Plan Note (Signed)
In general problem is adequately controlled. Continue Prednisone 10 mg daily. We discussed side effects of chronic prednisone treatment. DEXA order placed. Further recommendations will be given according to ESR and CRP results.

## 2020-11-15 NOTE — Patient Instructions (Addendum)
A few things to remember from today's visit:   PMR (polymyalgia rheumatica) ([place]) - Plan: C-reactive protein, Sedimentation rate, Basic metabolic panel  Asymptomatic postmenopausal estrogen deficiency - Plan: DG Bone Density  If you need refills please call your pharmacy. Do not use My Chart to request refills or for acute issues that need immediate attention.   No changes today. Continue Prednisone 10 mg daily, we will adjust dose according to inflammatory markers.  We will plan on pelvic exam next visit, next week. Please be sure medication list is accurate. If a new problem present, please set up appointment sooner than planned today.

## 2020-11-15 NOTE — Progress Notes (Signed)
HPI: Ariel Rios is a 65 y.o. female, who is here today for 3 months follow up.   She was last seen on 08/23/20. PMR:She is on Prednisone 10 mg daily. She noted mild proximal arm achy pain after Prednisone dose was decreased from 20 mg daily. Pain is mild and does not limit daily activities. She is not longer having shoulder pain or difficulty getting up from a chair. No joint edema or erythema.  Sleeping about 5 hours and it is not different since Prednisone. Negative for fever,headache,CP,palpitations, or SOB. She feels like her face is "puffy" but has not noted edema.  She has not received call about DEXA appt.  Lab Results  Component Value Date   ESRSEDRATE 2 08/23/2020   Lab Results  Component Value Date   CRP 3.6 08/23/2020   Lab Results  Component Value Date   CREATININE 0.82 08/23/2020   BUN 23 08/23/2020   NA 140 08/23/2020   K 3.3 (L) 08/23/2020   CL 100 08/23/2020   CO2 29 08/23/2020   She is exercising regularly, daily since 08/2020, stationary bike. Appetite has not changed. She eats if she is bored.  She has noted that waist is smaller.  A month or so,she noted some "oozing" after wiping, sanguinolent mucus on tissue. She did not think blood was rectal or vaginal, took a look and noted "some type of growth closed to her urethra." Negative for pain or pruritus. She has an appt for her CPE 11/23/20 and would like to wait for pelvic exam until then.  Review of Systems  Constitutional: Negative for activity change, appetite change and fatigue.  HENT: Negative for mouth sores, nosebleeds and sore throat.   Eyes: Negative for redness and visual disturbance.  Respiratory: Negative for cough and wheezing.   Cardiovascular: Negative for leg swelling.  Gastrointestinal: Negative for abdominal pain, nausea and vomiting.       Negative for changes in bowel habits.  Genitourinary: Negative for decreased urine volume and hematuria.  Neurological:  Negative for syncope and weakness.  Rest of ROS, see pertinent positives sand negatives in HPI  Current Outpatient Medications on File Prior to Visit  Medication Sig Dispense Refill  . hydrochlorothiazide (HYDRODIURIL) 25 MG tablet Take 1 tablet (25 mg total) by mouth daily as needed. 90 tablet 3   No current facility-administered medications on file prior to visit.   Past Medical History:  Diagnosis Date  . Migraine   . Venous insufficiency    No Known Allergies  Social History   Socioeconomic History  . Marital status: Married    Spouse name: Not on file  . Number of children: Not on file  . Years of education: Not on file  . Highest education level: Not on file  Occupational History  . Not on file  Tobacco Use  . Smoking status: Never Smoker  . Smokeless tobacco: Never Used  Vaping Use  . Vaping Use: Never used  Substance and Sexual Activity  . Alcohol use: No  . Drug use: No  . Sexual activity: Not on file  Other Topics Concern  . Not on file  Social History Narrative  . Not on file   Social Determinants of Health   Financial Resource Strain: Not on file  Food Insecurity: Not on file  Transportation Needs: Not on file  Physical Activity: Not on file  Stress: Not on file  Social Connections: Not on file   Vitals:   11/15/20 [phone]  BP: 124/70  Pulse: 70  Resp: 16  Temp: 97.7 F (36.5 C)  SpO2: 96%    Wt Readings from Last 3 Encounters:  11/15/20 190 lb 4 oz (86.3 kg)  08/23/20 189 lb 6.4 oz (85.9 kg)  07/26/20 185 lb 4 oz (84 kg)   Body mass index is 34.8 kg/m.  Physical Exam Vitals and nursing note reviewed.  Constitutional:      General: She is not in acute distress.    Appearance: She is well-developed.  HENT:     Head: Normocephalic and atraumatic.     Mouth/Throat:     Mouth: Oropharynx is clear and moist and mucous membranes are normal.  Eyes:     Conjunctiva/sclera: Conjunctivae normal.  Cardiovascular:     Rate and Rhythm: Normal  rate and regular rhythm.     Pulses:          Dorsalis pedis pulses are 2+ on the right side and 2+ on the left side.     Heart sounds: No murmur heard.   Pulmonary:     Effort: Pulmonary effort is normal. No respiratory distress.     Breath sounds: Normal breath sounds.  Abdominal:     Palpations: Abdomen is soft. There is no hepatomegaly or mass.     Tenderness: There is no abdominal tenderness.  Musculoskeletal:        General: No edema.     Right shoulder: No swelling, tenderness or bony tenderness. Normal range of motion.     Left shoulder: No swelling, tenderness or bony tenderness. Normal range of motion.  Lymphadenopathy:     Cervical: No cervical adenopathy.  Skin:    General: Skin is warm.     Findings: No erythema or rash.  Neurological:     Mental Status: She is alert and oriented to person, place, and time.     Cranial Nerves: No cranial nerve deficit.     Gait: Gait normal.     Deep Tendon Reflexes: Strength normal.  Psychiatric:        Mood and Affect: Mood and affect normal.     Comments: Well groomed, good eye contact.   ASSESSMENT AND PLAN:  Ariel Rios was seen today for 3 months follow-up.  Orders Placed This Encounter  Procedures  . DG Bone Density  . C-reactive protein  . Sedimentation rate  . Basic metabolic panel   Lab Results  Component Value Date   ESRSEDRATE 19 11/15/2020   Lab Results  Component Value Date   CRP <1.0 11/15/2020   Lab Results  Component Value Date   CREATININE 0.63 11/15/2020   BUN 17 11/15/2020   NA 142 11/15/2020   K 3.5 11/15/2020   CL 101 11/15/2020   CO2 31 11/15/2020   PMR (polymyalgia rheumatica) (HCC) In general problem is adequately controlled. Continue Prednisone 10 mg daily. We discussed side effects of chronic prednisone treatment. DEXA order placed. Further recommendations will be given according to ESR and CRP results.   Class 1 obesity with body mass index (BMI) of 34.0 to 34.9 in  adult Wt has been stable since her last visit. She understands the benefits of wt loss as well as adverse effects of obesity. Consistency with healthy diet and physical activity recommended.  Asymptomatic postmenopausal estrogen deficiency - DG Bone Density; Future  Vulvar lesion She refers to hold until next week, when she has appt for her CPE.  Return in about 5 months (around 04/14/2021) for Keep  next appt.   Vivia Rosenburg G. [name], MD  [facility]. [place] office.  A few things to remember from today's visit:   PMR (polymyalgia rheumatica) ([place]) - Plan: C-reactive protein, Sedimentation rate, Basic metabolic panel  Asymptomatic postmenopausal estrogen deficiency - Plan: DG Bone Density  If you need refills please call your pharmacy. Do not use My Chart to request refills or for acute issues that need immediate attention.   No changes today. Continue Prednisone 10 mg daily, we will adjust dose according to inflammatory markers.  We will plan on pelvic exam next visit, next week. Please be sure medication list is accurate. If a new problem present, please set up appointment sooner than planned today.

## 2020-11-22 ENCOUNTER — Other Ambulatory Visit: Payer: Self-pay

## 2020-11-23 ENCOUNTER — Encounter: Payer: Self-pay | Admitting: Family Medicine

## 2020-11-23 ENCOUNTER — Other Ambulatory Visit (HOSPITAL_COMMUNITY)
Admission: RE | Admit: 2020-11-23 | Discharge: 2020-11-23 | Disposition: A | Discharge status: Home / Self Care | Payer: No Typology Code available for payment source | Source: Ambulatory Visit | Attending: Family Medicine | Admitting: Family Medicine

## 2020-11-23 ENCOUNTER — Ambulatory Visit (INDEPENDENT_AMBULATORY_CARE_PROVIDER_SITE_OTHER): Payer: No Typology Code available for payment source | Admitting: Family Medicine

## 2020-11-23 VITALS — BP 110/70 | HR 70 | Temp 97.8°F | Resp 16 | Ht 62.5 in | Wt 190.8 lb

## 2020-11-23 DIAGNOSIS — Z23 Encounter for immunization: Secondary | ICD-10-CM | POA: Diagnosis not present

## 2020-11-23 DIAGNOSIS — Z124 Encounter for screening for malignant neoplasm of cervix: Secondary | ICD-10-CM

## 2020-11-23 DIAGNOSIS — Z130 Encounter for screening for diseases of the blood and blood-forming organs and certain disorders involving the immune mechanism: Secondary | ICD-10-CM

## 2020-11-23 DIAGNOSIS — E538 Deficiency of other specified B group vitamins: Secondary | ICD-10-CM

## 2020-11-23 DIAGNOSIS — N9089 Other specified noninflammatory disorders of vulva and perineum: Secondary | ICD-10-CM

## 2020-11-23 DIAGNOSIS — C801 Malignant (primary) neoplasm, unspecified: Secondary | ICD-10-CM

## 2020-11-23 DIAGNOSIS — E559 Vitamin D deficiency, unspecified: Secondary | ICD-10-CM

## 2020-11-23 DIAGNOSIS — Z0001 Encounter for general adult medical examination with abnormal findings: Secondary | ICD-10-CM | POA: Diagnosis not present

## 2020-11-23 DIAGNOSIS — Z13228 Encounter for screening for other metabolic disorders: Secondary | ICD-10-CM

## 2020-11-23 DIAGNOSIS — Z1329 Encounter for screening for other suspected endocrine disorder: Secondary | ICD-10-CM | POA: Diagnosis not present

## 2020-11-23 DIAGNOSIS — M353 Polymyalgia rheumatica: Secondary | ICD-10-CM

## 2020-11-23 DIAGNOSIS — E785 Hyperlipidemia, unspecified: Secondary | ICD-10-CM | POA: Diagnosis not present

## 2020-11-23 HISTORY — DX: Malignant (primary) neoplasm, unspecified: C80.1

## 2020-11-23 LAB — LIPID PANEL
Cholesterol: 207 mg/dL — ABNORMAL HIGH (ref 0–200)
HDL: 74.3 mg/dL (ref >39.00)
LDL Cholesterol: 112 mg/dL — ABNORMAL HIGH (ref 0–99)
NonHDL: 132.88
Total CHOL/HDL Ratio: 3
Triglycerides: 105 mg/dL (ref 0.0–149.0)
VLDL: 21 mg/dL (ref 0.0–40.0)

## 2020-11-23 LAB — VITAMIN B12: Vitamin B-12: 828 pg/mL (ref 211–911)

## 2020-11-23 LAB — HEMOGLOBIN A1C: Hgb A1c MFr Bld: 5.7 % (ref 4.6–6.5)

## 2020-11-23 LAB — VITAMIN D 25 HYDROXY (VIT D DEFICIENCY, FRACTURES): VITD: 35.27 ng/mL (ref 30.00–100.00)

## 2020-11-23 NOTE — Assessment & Plan Note (Signed)
Continue Vit D 2000 U daily.

## 2020-11-23 NOTE — Assessment & Plan Note (Signed)
Continue non pharmacologic treatment. Further recommendations according to lab result and 10 years CVD risk.

## 2020-11-23 NOTE — Progress Notes (Signed)
HPI: Ariel Rios is a 65 y.o. female, who is here today for her routine physical.  Last CPE: 05/04/20.  Regular exercise 3 or more time per week: For the past couple months she is doing the stationary bike 45-60 min daily. Following a healthy diet: She has not been consistent. She acknowledges she has a problem with portions. She loves potatoes and corn. She cooks at home. She lives with her husband.  Chronic medical problems: Recently diagnosed with PMR, microscopic hematuria, vitamin D deficiency, hyperlipidemia, and lower extremity edema among some.  Pap smear: 03/2016, Pap smear negative and HPV not detected. Hx of abnormal pap smears: Negative.  Immunization History  Administered Date(s) Administered  . Influenza Split 08/29/2011, 06/18/2012  . Influenza,inj,Quad PF,6+ Mos 07/29/2013, 06/08/2014, 06/21/2015, 07/04/2016, 07/23/2018, 07/25/2019, 07/26/2020  . Influenza-Unspecified 07/23/2018  . Pneumococcal Polysaccharide-23 11/23/2020  . Td 09/11/2005  . Tdap 11/17/2014  . Zoster Recombinat (Shingrix) 03/19/2018, 05/21/2018   Mammogram: 04/11/2017, BI-RADS 1. Colonoscopy: 01/17/2008. DEXA: N/A, order placed last visit. Hep C screening: 04/2017 NR.  Vitamin D deficiency:She is on Vit D 2000 U daily.  B12 deficiency: She is on B12 1000 mcg 3 times per week.  Lab Results  Component Value Date   VITAMINB12 344 05/04/2020   Hyperlipidemia:She did not start Pravastatin. Currently she is on non pharmacologic treatment.  Lab Results  Component Value Date   CHOL 226 (H) 05/04/2020   HDL 68 05/04/2020   LDLCALC 136 (H) 05/04/2020   LDLDIRECT 150.4 10/21/2013   TRIG 109 05/04/2020   CHOLHDL 3.3 05/04/2020   Review of Systems  Constitutional: Negative for appetite change and fever.  HENT: Negative for hearing loss, mouth sores, sore throat, trouble swallowing and voice change.   Eyes: Negative for redness and visual disturbance.  Respiratory: Negative for cough,  shortness of breath and wheezing.   Cardiovascular: Negative for chest pain and leg swelling.  Gastrointestinal: Negative for abdominal pain, nausea and vomiting.       No changes in bowel habits.  Endocrine: Negative for cold intolerance, heat intolerance, polydipsia, polyphagia and polyuria.  Genitourinary: Negative for decreased urine volume, dysuria, hematuria, vaginal bleeding and vaginal discharge.       Vulvar lesion noted a few months ago. It bleeds occasionally. No hx of trauma.  Musculoskeletal: Negative for gait problem and myalgias.  Skin: Negative for color change and rash.  Allergic/Immunologic: Negative for environmental allergies.  Neurological: Negative for syncope, weakness and headaches.  Hematological: Negative for adenopathy. Does not bruise/bleed easily.  Psychiatric/Behavioral: Negative for behavioral problems and confusion.  All other systems reviewed and are negative.  Current Outpatient Medications on File Prior to Visit  Medication Sig Dispense Refill  . hydrochlorothiazide (HYDRODIURIL) 25 MG tablet Take 1 tablet (25 mg total) by mouth daily as needed. 90 tablet 3  . predniSONE (DELTASONE) 10 MG tablet Take 1 tablet (10 mg total) by mouth daily with breakfast. DUE FOR FOLLOW UP in 10/2020. 90 tablet 0   No current facility-administered medications on file prior to visit.   Past Medical History:  Diagnosis Date  . Migraine   . Venous insufficiency    History reviewed. No pertinent surgical history.  No Known Allergies  Family History  Problem Relation Age of Onset  . Diabetes Mother   . AVM Father   . Breast cancer Neg Hx    Social History   Socioeconomic History  . Marital status: Married    Spouse name: Not on file  .  Number of children: Not on file  . Years of education: Not on file  . Highest education level: Not on file  Occupational History  . Not on file  Tobacco Use  . Smoking status: Never Smoker  . Smokeless tobacco: Never Used   Vaping Use  . Vaping Use: Never used  Substance and Sexual Activity  . Alcohol use: No  . Drug use: No  . Sexual activity: Not on file  Other Topics Concern  . Not on file  Social History Narrative  . Not on file   Social Determinants of Health   Financial Resource Strain: Not on file  Food Insecurity: Not on file  Transportation Needs: Not on file  Physical Activity: Not on file  Stress: Not on file  Social Connections: Not on file   Vitals:   11/23/20 0715  BP: 110/70  Pulse: 70  Resp: 16  Temp: 97.8 F (36.6 C)  SpO2: 98%   Body mass index is 34.34 kg/m.  Wt Readings from Last 3 Encounters:  11/23/20 190 lb 12.8 oz (86.5 kg)  11/15/20 190 lb 4 oz (86.3 kg)  08/23/20 189 lb 6.4 oz (85.9 kg)   Physical Exam Vitals and nursing note reviewed. Exam conducted with a chaperone present.  Constitutional:      General: She is not in acute distress.    Appearance: She is well-developed.  HENT:     Head: Normocephalic and atraumatic.     Right Ear: External ear normal.     Left Ear: External ear normal.     Ears:     Comments: Cerumen excess bilateral, cannot see TM,bilateral.    Mouth/Throat:     Mouth: Oropharynx is clear and moist and mucous membranes are normal.     Pharynx: Uvula midline.  Eyes:     Extraocular Movements: Extraocular movements intact and EOM normal.     Conjunctiva/sclera: Conjunctivae normal.     Pupils: Pupils are equal, round, and reactive to light.  Neck:     Thyroid: No thyromegaly.     Trachea: No tracheal deviation.  Cardiovascular:     Rate and Rhythm: Normal rate and regular rhythm.     Pulses:          Dorsalis pedis pulses are 2+ on the right side and 2+ on the left side.     Heart sounds: No murmur heard.   Pulmonary:     Effort: Pulmonary effort is normal. No respiratory distress.     Breath sounds: Normal breath sounds.  Chest:  Breasts:     Right: No axillary adenopathy or supraclavicular adenopathy.     Left: No  axillary adenopathy or supraclavicular adenopathy.    Abdominal:     Palpations: Abdomen is soft. There is no hepatomegaly or mass.     Tenderness: There is no abdominal tenderness.  Genitourinary:    Exam position: Lithotomy position.     Labia:        Right: Lesion present. No rash or tenderness.        Left: No rash, tenderness or lesion.      Vagina: Prolapsed vaginal walls present. No vaginal discharge, erythema or bleeding.     Cervix: No cervical motion tenderness, discharge or friability.     Uterus: Not enlarged and not tender.      Adnexa:        Right: No mass, tenderness or fullness.         Left: No mass, tenderness  or fullness.         Comments: Breast: No masses, skin abnormalities, or nipple discharge appreciated bilateral. Cystocele.Pap smear collected. Musculoskeletal:        General: No edema.     Comments: No signs of synovitis appreciated.  Lymphadenopathy:     Cervical: No cervical adenopathy.     Upper Body:     Right upper body: No supraclavicular or axillary adenopathy.     Left upper body: No supraclavicular or axillary adenopathy.     Lower Body: No right inguinal adenopathy. No left inguinal adenopathy.  Skin:    General: Skin is warm.     Findings: No erythema or rash.  Neurological:     Mental Status: She is alert and oriented to person, place, and time.     Cranial Nerves: No cranial nerve deficit.     Coordination: Coordination normal.     Gait: Gait normal.     Deep Tendon Reflexes: Strength normal.     Reflex Scores:      Bicep reflexes are 2+ on the right side and 2+ on the left side.      Patellar reflexes are 2+ on the right side and 2+ on the left side. Psychiatric:        Mood and Affect: Mood and affect normal. Mood is not anxious or depressed.     Comments: Well groomed, good eye contact.   ASSESSMENT AND PLAN:  Ariel Rios was here today annual physical examination.  Orders Placed This Encounter  Procedures  .  Pneumococcal polysaccharide vaccine 23-valent greater than or equal to 2yo subcutaneous/IM  . Vitamin B12  . VITAMIN D 25 Hydroxy (Vit-D Deficiency, Fractures)  . Hemoglobin A1c  . Lipid panel  . Ambulatory referral to Gynecology   Lab Results  Component Value Date   CHOL 207 (H) 11/23/2020   HDL 74.30 11/23/2020   LDLCALC 112 (H) 11/23/2020   LDLDIRECT 150.4 10/21/2013   TRIG 105.0 11/23/2020   CHOLHDL 3 11/23/2020   Lab Results  Component Value Date   HGBA1C 5.7 11/23/2020   Lab Results  Component Value Date   [id number] 034 11/23/2020   Encounter for general adult medical examination with abnormal findings We discussed the importance of regular physical activity and healthy diet for prevention of chronic illness and/or complications. Preventive guidelines reviewed. Vaccination: Because on chronic prednisone pneumovax was given today. Pending DEXA. She will schedule mammogram. She prefers to hold on colon cancer screening.  Ca++ and vit D supplementation to continue. Next CPE in a year.  The 10-year ASCVD risk score Mikey Bussing DC Brooke Bonito., et al., 2013) is: 3.9%   Values used to calculate the score:     Age: 64 years     Sex: Female     Is Non-Hispanic African American: No     Diabetic: No     Tobacco smoker: No     Systolic Blood Pressure: 742 mmHg     Is BP treated: Yes     HDL Cholesterol: 74.3 mg/dL     Total Cholesterol: 207 mg/dL  Cervical cancer screening -     Cytology - PAP ([place])  Screening for endocrine, metabolic and immunity disorder -     Hemoglobin A1c  Vulvar lesion Lesion is concerning for malignancy, so it needs to be biopsied. Gyn referral placed.  PMR (polymyalgia rheumatica) (HCC) -     Pneumococcal polysaccharide vaccine 23-valent greater than or equal to 2yo subcutaneous/IM  Hyperlipidemia Continue  non pharmacologic treatment. Further recommendations according to lab result and 10 years CVD risk.  Vitamin D deficiency,  unspecified Continue Vit D 2000 U daily.  B12 deficiency Continue B12 1000 mcg 3 times per week. Treatment will be adjusted according to B12 result.  Return for Keep next appt.  Michail Boyte G. [name], MD  [facility]. [place] office.   Today you have you routine preventive visit. A few things to remember from today's visit:  Encounter for general adult medical examination with abnormal findings  Hyperlipidemia, unspecified hyperlipidemia type - Plan: Lipid panel  B12 deficiency - Plan: Vitamin B12  Vitamin D deficiency, unspecified - Plan: VITAMIN D 25 Hydroxy (Vit-D Deficiency, Fractures)  Cervical cancer screening - Plan: Cytology - PAP ([place])  Screening for endocrine, metabolic and immunity disorder - Plan: Hemoglobin A1c  Vulvar lesion - Plan: Ambulatory referral to Gynecology  If you need refills please call your pharmacy. Do not use My Chart to request refills or for acute issues that need immediate attention.   Please be sure medication list is accurate. If a new problem present, please set up appointment sooner than planned today.  At least 150 minutes of moderate exercise per week, daily brisk walking for 15-30 min is a good exercise option. Healthy diet low in saturated (animal) fats and sweets and consisting of fresh fruits and vegetables, lean meats such as fish and white chicken and whole grains.  These are some of recommendations for screening depending of age and risk factors:  - Vaccines:  Tdap vaccine every 10 years.  Shingles vaccine recommended at age 44, could be given after 64 years of age but not sure about insurance coverage.   Pneumonia vaccines: Pneumovax at 41. Sometimes Pneumovax is giving earlier if history of smoking, lung disease,diabetes,kidney disease among some.  Screening for diabetes at age 55 and every 3 years.  Cervical cancer prevention:  Pap smear starts at 64 years of age and continues periodically until 64 years  old in low risk women. Pap smear every 3 years between 22 and 20 years old. Pap smear every 3-5 years between women 31 and older if pap smear negative and HPV screening negative.   -Breast cancer: Mammogram: There is disagreement between experts about when to start screening in low risk asymptomatic female but recent recommendations are to start screening at 58 and not later than 63 years old , every 1-2 years and after 64 yo q 2 years. Screening is recommended until 64 years old but some women can continue screening depending of healthy issues.  Colon cancer screening: Has been recently changed to 64 yo. Insurance may not cover until you are 64 years old. Screening is recommended until 64 years old.  Cholesterol disorder screening at age 38 and every 3 years.N/A  Also recommended:  1. Dental visit- Brush and floss your teeth twice daily; visit your dentist twice a year. 2. Eye doctor- Get an eye exam at least every 2 years. 3. Helmet use- Always wear a helmet when riding a bicycle, motorcycle, rollerblading or skateboarding. 4. Safe sex- If you may be exposed to sexually transmitted infections, use a condom. 5. Seat belts- Seat belts can save your live; always wear one. 6. Smoke/Carbon Monoxide detectors- These detectors need to be installed on the appropriate level of your home. Replace batteries at least once a year. 7. Skin cancer- When out in the sun please cover up and use sunscreen 15 SPF or higher. 8. Violence- If anyone is threatening  or hurting you, please tell your healthcare provider.  9. Drink alcohol in moderation- Limit alcohol intake to one drink or less per day. Never drink and drive. 10. Calcium supplementation 1000 to 1200 mg daily, ideally through your diet.  Vitamin D supplementation 800 units daily.

## 2020-11-23 NOTE — Assessment & Plan Note (Signed)
Continue B12 1000 mcg 3 times per week. Treatment will be adjusted according to B12 result.

## 2020-11-23 NOTE — Patient Instructions (Addendum)
Today you have you routine preventive visit. A few things to remember from today's visit:  Encounter for general adult medical examination with abnormal findings  Hyperlipidemia, unspecified hyperlipidemia type - Plan: Lipid panel  B12 deficiency - Plan: Vitamin B12  Vitamin D deficiency, unspecified - Plan: VITAMIN D 25 Hydroxy (Vit-D Deficiency, Fractures)  Cervical cancer screening - Plan: Cytology - PAP ([place])  Screening for endocrine, metabolic and immunity disorder - Plan: Hemoglobin A1c  Vulvar lesion - Plan: Ambulatory referral to Gynecology  If you need refills please call your pharmacy. Do not use My Chart to request refills or for acute issues that need immediate attention.   Please be sure medication list is accurate. If a new problem present, please set up appointment sooner than planned today.  At least 150 minutes of moderate exercise per week, daily brisk walking for 15-30 min is a good exercise option. Healthy diet low in saturated (animal) fats and sweets and consisting of fresh fruits and vegetables, lean meats such as fish and white chicken and whole grains.  These are some of recommendations for screening depending of age and risk factors:  - Vaccines:  Tdap vaccine every 10 years.  Shingles vaccine recommended at age 5, could be given after 64 years of age but not sure about insurance coverage.   Pneumonia vaccines: Pneumovax at 30. Sometimes Pneumovax is giving earlier if history of smoking, lung disease,diabetes,kidney disease among some.  Screening for diabetes at age 4 and every 3 years.  Cervical cancer prevention:  Pap smear starts at 64 years of age and continues periodically until 64 years old in low risk women. Pap smear every 3 years between 66 and 55 years old. Pap smear every 3-5 years between women 53 and older if pap smear negative and HPV screening negative.   -Breast cancer: Mammogram: There is disagreement between experts  about when to start screening in low risk asymptomatic female but recent recommendations are to start screening at 68 and not later than 64 years old , every 1-2 years and after 64 yo q 2 years. Screening is recommended until 64 years old but some women can continue screening depending of healthy issues.  Colon cancer screening: Has been recently changed to 64 yo. Insurance may not cover until you are 64 years old. Screening is recommended until 64 years old.  Cholesterol disorder screening at age 30 and every 3 years.N/A  Also recommended:  1. Dental visit- Brush and floss your teeth twice daily; visit your dentist twice a year. 2. Eye doctor- Get an eye exam at least every 2 years. 3. Helmet use- Always wear a helmet when riding a bicycle, motorcycle, rollerblading or skateboarding. 4. Safe sex- If you may be exposed to sexually transmitted infections, use a condom. 5. Seat belts- Seat belts can save your live; always wear one. 6. Smoke/Carbon Monoxide detectors- These detectors need to be installed on the appropriate level of your home. Replace batteries at least once a year. 7. Skin cancer- When out in the sun please cover up and use sunscreen 15 SPF or higher. 8. Violence- If anyone is threatening or hurting you, please tell your healthcare provider.  9. Drink alcohol in moderation- Limit alcohol intake to one drink or less per day. Never drink and drive. 10. Calcium supplementation 1000 to 1200 mg daily, ideally through your diet.  Vitamin D supplementation 800 units daily.

## 2020-11-24 LAB — CYTOLOGY - PAP
Comment: NEGATIVE
Diagnosis: NEGATIVE
High risk HPV: NEGATIVE

## 2020-12-07 ENCOUNTER — Other Ambulatory Visit: Payer: Self-pay

## 2020-12-07 ENCOUNTER — Encounter: Payer: Self-pay | Admitting: Obstetrics and Gynecology

## 2020-12-07 ENCOUNTER — Ambulatory Visit: Payer: No Typology Code available for payment source | Admitting: Obstetrics and Gynecology

## 2020-12-07 ENCOUNTER — Other Ambulatory Visit (HOSPITAL_COMMUNITY)
Admission: RE | Admit: 2020-12-07 | Discharge: 2020-12-07 | Disposition: A | Discharge status: Home / Self Care | Payer: No Typology Code available for payment source | Source: Ambulatory Visit | Attending: Obstetrics and Gynecology | Admitting: Obstetrics and Gynecology

## 2020-12-07 VITALS — BP 134/68 | HR 67 | Ht 63.0 in | Wt 187.6 lb

## 2020-12-07 DIAGNOSIS — N9089 Other specified noninflammatory disorders of vulva and perineum: Secondary | ICD-10-CM | POA: Insufficient documentation

## 2020-12-07 DIAGNOSIS — C519 Malignant neoplasm of vulva, unspecified: Secondary | ICD-10-CM | POA: Diagnosis not present

## 2020-12-07 NOTE — Progress Notes (Signed)
65 y.o. G52P2002 Married White or Caucasian Not Hispanic or Latino female here for a vulvar lesion that is draining. She says that it is an eraser size greyish      She has been on steroids since November for Polymyalgia Rheumatica. She notices a little blood tinged watery discharge on her underwear. After straining with BM she saw some bright red blood, she looked with a mirror and she a lump that was bleeding. No pain, no itching. If it hadn't been for the blood she wouldn't have noticed it.  No LMP recorded. Patient is postmenopausal.          Sexually active: No.  The current method of family planning is post menopausal status.    Exercising: Yes.    Smoker:  no  Health Maintenance: Pap:  2022 pap normal  History of abnormal Pap:  no MMG:  04/11/20 Bi-rads  1 neg  BMD:  Yes at age 79 normal is scheduling one for this year  Colonoscopy: is due for one had one when she was 20  TDaP:  2016 Gardasil: none    reports that she has never smoked. She has never used smokeless tobacco. She reports that she does not drink alcohol and does not use drugs.  Past Medical History:  Diagnosis Date  . Migraine   . Venous insufficiency     No past surgical history on file.  Current Outpatient Medications  Medication Sig Dispense Refill  . hydrochlorothiazide (HYDRODIURIL) 25 MG tablet Take 1 tablet (25 mg total) by mouth daily as needed. 90 tablet 3  . predniSONE (DELTASONE) 10 MG tablet Take 1 tablet (10 mg total) by mouth daily with breakfast. DUE FOR FOLLOW UP in 10/2020. 90 tablet 0  . vitamin B-12 (CYANOCOBALAMIN) 50 MCG tablet Take 50 mcg by mouth daily.    [name] VITAMIN D, CHOLECALCIFEROL, PO Take 2,000 Int'l Units/1.35m2 by mouth.     No current facility-administered medications for this visit.    Family History  Problem Relation Age of Onset  . Diabetes Mother   . AVM Father   . Breast cancer Neg Hx     Review of Systems  All other systems reviewed and are negative.   Exam:   BP  134/68   Pulse 67   Ht 5\' 3"  (1.6 m)   Wt 187 lb 9.6 oz (85.1 kg)   SpO2 99%   BMI 33.23 kg/m   Weight change: @WEIGHTCHANGE @ Height:   Height: 5\' 3"  (160 cm)  Ht Readings from Last 3 Encounters:  12/07/20 5\' 3"  (1.6 m)  11/23/20 5' 2.5" (1.588 m)  11/15/20 5\' 2"  (1.575 m)    General appearance: alert, cooperative and appears stated age    Pelvic: External genitalia:  Large lesion on the upper inner right labia majora, ~1 cm friable raised lesion over a larger black base.               Urethra:  normal appearing urethra with no masses, tenderness or lesions              Bartholins and Skenes: normal     The risks of the procedure were reviewed with the patient and a consent was signed. The area was cleansed with betadine and injected with 1% lidocaine. A #11 blade was used to remove the entire lesion. The defect was closed with 2 deep stitches of 4-0 vicryl and ~6 simple stitches in the skin. The patient tolerated the procedure well.  1. Vulvar lesion Large lesion with black base - Surgical pathology( [place]/ [facility]) -Further plans after results return                CC: Betty [name], MD

## 2020-12-09 ENCOUNTER — Telehealth: Payer: Self-pay | Admitting: Obstetrics and Gynecology

## 2020-12-09 LAB — SURGICAL PATHOLOGY

## 2020-12-09 NOTE — Telephone Encounter (Signed)
Call placed to mobile number, left message for patient to return call to [place], [place] at [facility].   Call placed to work number, was advised Lexany is not working today.

## 2020-12-09 NOTE — Telephone Encounter (Signed)
Please call the patient and see if she can come in today at 4 to discuss her biopsy results.

## 2020-12-10 NOTE — Telephone Encounter (Signed)
Spoke with patient.   OV scheduled for 3/21 at 11:30am with Dr. Talbert Nan to discuss vulvar biopsy results.   Patient agreeable to date and time.   Routing to provider for final review. Patient is agreeable to disposition. Will close encounter.

## 2020-12-13 ENCOUNTER — Ambulatory Visit: Payer: Self-pay | Admitting: Obstetrics and Gynecology

## 2020-12-13 ENCOUNTER — Ambulatory Visit: Payer: No Typology Code available for payment source | Admitting: Obstetrics and Gynecology

## 2020-12-13 ENCOUNTER — Encounter: Payer: Self-pay | Admitting: Obstetrics and Gynecology

## 2020-12-13 ENCOUNTER — Other Ambulatory Visit: Payer: Self-pay

## 2020-12-13 VITALS — BP 130/70 | HR 88 | Ht 62.5 in | Wt 187.0 lb

## 2020-12-13 DIAGNOSIS — C439 Malignant melanoma of skin, unspecified: Secondary | ICD-10-CM | POA: Diagnosis not present

## 2020-12-13 NOTE — Progress Notes (Signed)
GYNECOLOGY  VISIT   HPI: 65 y.o.   Married White or Caucasian Not Hispanic or Latino  female   [phone] with No LMP recorded. Patient is postmenopausal.   here to discuss biopsy results. She is feeling fine, no pain.  Last week the patient had a vulvar biopsy:  FINAL MICROSCOPIC DIAGNOSIS:   A.  RIGHT VULVA, BIOPSY: PRIMARY MALIGNANT MELANOMA OF VULVA, MELANOMA  TABLE BELOW  MALIGNANT MELANOMA (AJCC 8TH EDITION):  PROCEDURE: BIOPSY  SPECIMEN ANATOMIC SITE: RIGHT VULVA  BRESLOW'S DEPTH/MAXIMUM TUMOR THICKNESS: 5.05 MM  CLARK/ANATOMIC LEVEL* IV  MARGINS:  PERIPHERAL: INVOLVED  DEEP: FREE  ULCERATION: PRESENT  MITOTIC INDEX: 11/mm2  LYMPHO-VASCULAR INVASION: PRESENT*  NEUTROTROPISM: ABSENT  TUMOR-INFILTRATING LYMPHOCYTES: NON-BRISK  LYMPH NODES: N/A  PATHOLOGIC STAGE: PT4B NX MX  *VULVAR MELANOMAS MAY NOT HAVE THE SAME BEHAVIOR STAGE FOR STAGE AS  SUPERFICIAL SPREADING CUTANEOUS MELANOMA, AND LANDMARKS SUCH AS  BRESLOW'S DEPTH MAY NOT APPLY. NONETHELESS, THIS SHOULD BE TREATED AS A  PT4B MELANOMA AND COMPLETE EXCISION, WITH POSSIBLE LYMPH NODE  EVALUATION, UNDERTAKEN. SEE COMMENT FOR IHC PROFILE  GYNECOLOGIC HISTORY: No LMP recorded. Patient is postmenopausal.         OB History    Gravida  2   Para  2   Term  2   Preterm      AB      Living  2     SAB      IAB      Ectopic      Multiple      Live Births  2              Patient Active Problem List   Diagnosis Date Noted  . PMR (polymyalgia rheumatica) ([place]) 11/14/2020  . Class 1 obesity with body mass index (BMI) of 34.0 to 34.9 in adult 08/23/2020  . Vitamin D deficiency, unspecified 12/09/2018  . B12 deficiency 12/09/2018  . Asymptomatic microscopic hematuria 04/17/2016  . Bilateral lower extremity edema 01/24/2016  . 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  . Hyperlipidemia 01/24/2016  . SEBORRHEIC KERATOSIS, INFLAMED 03/29/2010  . CERUMEN IMPACTION, BILATERAL 03/10/2010  . VAGINITIS, ATROPHIC  03/10/2010  . Venous (peripheral) insufficiency 12/23/2007    Past Medical History:  Diagnosis Date  . Migraine   . Venous insufficiency     No past surgical history on file.  Current Outpatient Medications  Medication Sig Dispense Refill  . hydrochlorothiazide (HYDRODIURIL) 25 MG tablet Take 1 tablet (25 mg total) by mouth daily as needed. 90 tablet 3  . predniSONE (DELTASONE) 10 MG tablet Take 1 tablet (10 mg total) by mouth daily with breakfast. DUE FOR FOLLOW UP in 10/2020. 90 tablet 0  . vitamin B-12 (CYANOCOBALAMIN) 50 MCG tablet Take 50 mcg by mouth daily.    [name] VITAMIN D, CHOLECALCIFEROL, PO Take 2,000 Int'l Units/1.42m2 by mouth.     No current facility-administered medications for this visit.     ALLERGIES: Patient has no known allergies.  Family History  Problem Relation Age of Onset  . Diabetes Mother   . AVM Father   . Breast cancer Neg Hx     Social History   Socioeconomic History  . Marital status: Married    Spouse name: Not on file  . Number of children: Not on file  . Years of education: Not on file  . Highest education level: Not on file  Occupational History  . Not on file  Tobacco Use  . Smoking status: Never Smoker  .  Smokeless tobacco: Never Used  Vaping Use  . Vaping Use: Never used  Substance and Sexual Activity  . Alcohol use: No  . Drug use: No  . Sexual activity: Not Currently  Other Topics Concern  . Not on file  Social History Narrative  . Not on file   Social Determinants of Health   Financial Resource Strain: Not on file  Food Insecurity: Not on file  Transportation Needs: Not on file  Physical Activity: Not on file  Stress: Not on file  Social Connections: Not on file  Intimate Partner Violence: Not on file    ROS  PHYSICAL EXAMINATION:    There were no vitals taken for this visit.    General appearance: alert, cooperative and appears stated age  63. Malignant melanoma, unspecified site (HCC)-Vulva Discussed  results Referral to GYN Oncology  CC: Dr [name]

## 2020-12-16 ENCOUNTER — Telehealth: Payer: Self-pay | Admitting: *Deleted

## 2020-12-16 ENCOUNTER — Encounter: Payer: Self-pay | Admitting: Obstetrics and Gynecology

## 2020-12-16 NOTE — Telephone Encounter (Signed)
Referral message sent to [facility] at Gyn-oncology to call to schedule referral.

## 2020-12-16 NOTE — Telephone Encounter (Signed)
-----   Message from Salvadore Dom, MD sent at 12/13/2020 12:08 PM EDT ----- Please get this patient at [place], she has malignant melanoma of the vulva. I'm meeting her at 4:15 today to discuss the results, if possible it would be nice to have an appointment time to give her.

## 2020-12-16 NOTE — Telephone Encounter (Signed)
Called and spoke with the patient's husband; scheduled a new patient appt for tomorrow with Dr Berline Lopes

## 2020-12-17 ENCOUNTER — Encounter: Payer: Self-pay | Admitting: Oncology

## 2020-12-17 ENCOUNTER — Ambulatory Visit (HOSPITAL_BASED_OUTPATIENT_CLINIC_OR_DEPARTMENT_OTHER): Payer: No Typology Code available for payment source | Admitting: Hematology and Oncology

## 2020-12-17 ENCOUNTER — Encounter: Payer: Self-pay | Admitting: Hematology and Oncology

## 2020-12-17 ENCOUNTER — Encounter: Payer: Self-pay | Admitting: Gynecologic Oncology

## 2020-12-17 ENCOUNTER — Other Ambulatory Visit: Payer: Self-pay

## 2020-12-17 ENCOUNTER — Inpatient Hospital Stay: Payer: No Typology Code available for payment source | Attending: Gynecologic Oncology | Admitting: Gynecologic Oncology

## 2020-12-17 VITALS — BP 145/83 | HR 83 | Temp 98.3°F | Resp 18 | Wt 191.0 lb

## 2020-12-17 DIAGNOSIS — M353 Polymyalgia rheumatica: Secondary | ICD-10-CM | POA: Diagnosis not present

## 2020-12-17 DIAGNOSIS — C519 Malignant neoplasm of vulva, unspecified: Secondary | ICD-10-CM | POA: Diagnosis not present

## 2020-12-17 DIAGNOSIS — Z79899 Other long term (current) drug therapy: Secondary | ICD-10-CM | POA: Diagnosis not present

## 2020-12-17 DIAGNOSIS — I872 Venous insufficiency (chronic) (peripheral): Secondary | ICD-10-CM | POA: Insufficient documentation

## 2020-12-17 MED ORDER — OXYCODONE HCL 5 MG PO TABS: 5.0000 mg | ORAL_TABLET | Freq: Four times a day (QID) | ORAL | 0 refills | Status: DC | PRN

## 2020-12-17 MED FILL — oxyCODONE HCL 5 MG TABS: 5 | 7 days supply | Qty: 30 | Fill #0

## 2020-12-17 NOTE — Progress Notes (Signed)
[facility] CONSULT NOTE  Patient Care Team: [name], [name], MD as PCP - General (Family Medicine)  ASSESSMENT & PLAN:  Melanoma of vulva [facility]) I reviewed the general approach to melanoma with the patient She is scheduled for wide local excision and sentinel lymph node biopsy PET CT scan has been scheduled next week We discussed the importance of proper staging Given the depth of her melanoma and presence of ulceration and other adverse features including lymphovascular invasion, I would highly recommend the patient to consider adjuvant treatment with immunotherapy However, I am concerned about her chronic use of prednisone that would be considered somewhat contraindicated to receive immunotherapy I recommend prednisone taper slowly The patient would benefit from genetic counseling in the future given strong family history of melanoma The patient is instructed for aggressive skin protection in the meantime I will refer her to see dermatologist for formal evaluation in the future The rest of her examination today is benign I will get pathologist to add BRAF mutation studies to her recent pathology sample  PMR (polymyalgia rheumatica) ([place]) She has been on chronic prednisone therapy since late last year for polymyalgia rheumatica The patient is in the course of prednisone taper She is concerned about flare of pain I recommend consideration for short-term pain management with narcotic prescription if needed to help her to be successful with prednisone taper She is willing to try I recommend she start reducing prednisone to 5 mg daily until her next visit with me in a few weeks I gave her a small prescription of oxycodone to take as needed I warned her about side effects of oxycodone including sedation and constipation We discussed narcotic refill policy   No orders of the defined types were placed in this encounter.   The total time spent in the appointment was 60  minutes encounter with patients including review of chart and various tests results, discussions about plan of care and coordination of care plan   All questions were answered. The patient knows to call the clinic with any problems, questions or concerns. No barriers to learning was detected.  [name], MD 3/25/202212:23 PM  CHIEF COMPLAINTS/PURPOSE OF CONSULTATION:  Vulvar melanoma, for further evaluation and management  HISTORY OF PRESENTING ILLNESS:  Ariel Rios 65 y.o. female is here because of recent diagnosis of vulvar melanoma She has strong family history of melanoma.  Her father was diagnosed in the 14s She has no other family history of cancer  Growing up, she has excessive exposure as a child.  She tends to to get skin burn easily She tries to use sunscreen whenever she is out She denies the use of suntan facilities. She denies new skin moles.  She does not see her dermatologist on a regular basis She started to notice bleeding on her underwear last month and was evaluated at but was not found to have abnormal lesion Recently, when she have another bleeding, she used a mirror to look at the vulvar region and found abnormal growth.  She denies pain She saw her primary care doctor and had Pap smear done earlier this month and was referred to see gynecologist She had excisional biopsy which confirmed diagnosis of melanoma  I have reviewed her chart and materials related to her cancer extensively and collaborated history with the patient. Summary of oncologic history is as follows: Oncology History  Melanoma of vulva ([place])  10/26/2020 Initial Diagnosis   She was noted to have bloody discharge in her  underwear.  She denies pain   12/07/2020 Initial Biopsy   FINAL MICROSCOPIC DIAGNOSIS:   A.   RIGHT VULVA, BIOPSY: PRIMARY MALIGNANT MELANOMA OF VULVA, MELANOMA  TABLE BELOW  MALIGNANT MELANOMA (AJCC 8TH EDITION):  PROCEDURE: BIOPSY  SPECIMEN ANATOMIC SITE: RIGHT VULVA   BRESLOW'S DEPTH/MAXIMUM TUMOR THICKNESS: 5.05 MM  CLARK/ANATOMIC LEVEL* IV  MARGINS:  PERIPHERAL: INVOLVED  DEEP: FREE  ULCERATION: PRESENT  MITOTIC INDEX: 11/mm2  LYMPHO-VASCULAR INVASION: PRESENT*  NEUTROTROPISM: ABSENT  TUMOR-INFILTRATING LYMPHOCYTES: NON-BRISK  LYMPH NODES: N/A  PATHOLOGIC STAGE: PT4B NX MX  *VULVAR MELANOMAS MAY NOT HAVE THE SAME BEHAVIOR STAGE FOR STAGE AS SUPERFICIAL SPREADING CUTANEOUS MELANOMA, AND LANDMARKS SUCH AS BRESLOW'S DEPTH MAY NOT APPLY. NONETHELESS, THIS SHOULD BE TREATED AS A PT4B MELANOMA AND COMPLETE EXCISION, WITH POSSIBLE LYMPH NODE EVALUATION, UNDERTAKEN. SEE COMMENT FOR IHC PROFILE.    COMMENT:  Sections show confluent and nested growth of atypical melanocytes along vulvar epithelium in an in-situ melanoma growth pattern at the periphery, with a central large ulcerated nodule of similar atypical  melanocytes, consonant with primary vulvar melanoma. Notably, IHC was performed to evaluate the lineage and is diagnostic for melanoma. Lesional melanocytes are positive with S100, MelanA, and show a high ki-67 uptake, and are negative with CDX-2, synaptophysin, CK20, TTF-1, CK7, CD56, and lymphoid markers CD5, CD3, CD20 highlight only background lymphocytes.    12/17/2020 Initial Diagnosis   Melanoma of vulva ([place])   12/17/2020 Cancer Staging   Staging form: Melanoma of the Skin, AJCC 8th Edition - Clinical stage from 12/17/2020: Stage IIC (cT4b, cN0, cM0) - Signed by [name], MD on 12/17/2020 Stage prefix: Initial diagnosis    She has no palpable lymphadenopathy.  Denies recent weight loss.  She is on chronic prednisone therapy since November last year when she was diagnosed with polymyalgia rheumatica She was started on 40 mg of prednisone and is on the taper course She has diffuse musculoskeletal pain usually in the morning, limiting physical activity  MEDICAL HISTORY:  Past Medical History:  Diagnosis Date  . BMI 34.0-34.9,adult   .  Melanotic macule of vulvar labia   . Migraine   . Polymyalgia rheumatica ([place])   . Venous insufficiency     SURGICAL HISTORY: History reviewed. No pertinent surgical history.  SOCIAL HISTORY: Social History   Socioeconomic History  . Marital status: Married    Spouse name: Not on file  . Number of children: 2  . Years of education: Not on file  . Highest education level: Not on file  Occupational History  . Occupation: Economist  . Occupation: stocking  Tobacco Use  . Smoking status: Never Smoker  . Smokeless tobacco: Never Used  Vaping Use  . Vaping Use: Never used  Substance and Sexual Activity  . Alcohol use: Yes    Comment: occasional  . Drug use: No  . Sexual activity: Not Currently  Other Topics Concern  . Not on file  Social History Narrative  . Not on file   Social Determinants of Health   Financial Resource Strain: Not on file  Food Insecurity: Not on file  Transportation Needs: Not on file  Physical Activity: Not on file  Stress: Not on file  Social Connections: Not on file  Intimate Partner Violence: Not on file    FAMILY HISTORY: Family History  Problem Relation Age of Onset  . Diabetes Mother   . Melanoma Mother   . AVM Father   . Breast cancer Neg Hx   .  Ovarian cancer Neg Hx   . Colon cancer Neg Hx   . Uterine cancer Neg Hx     ALLERGIES:  has No Known Allergies.  MEDICATIONS:  Current Outpatient Medications  Medication Sig Dispense Refill  . oxyCODONE (OXY IR/ROXICODONE) 5 MG immediate release tablet Take 1 tablet (5 mg total) by mouth every 6 (six) hours as needed for severe pain. 30 tablet 0  . hydrochlorothiazide (HYDRODIURIL) 25 MG tablet Take 1 tablet (25 mg total) by mouth daily as needed. 90 tablet 3  . predniSONE (DELTASONE) 10 MG tablet Take 1 tablet (10 mg total) by mouth daily with breakfast. DUE FOR FOLLOW UP in 10/2020. 90 tablet 0  . vitamin B-12 (CYANOCOBALAMIN) 50 MCG tablet Take 50 mcg by mouth daily.    [name] VITAMIN  D, CHOLECALCIFEROL, PO Take 2,000 Int'l Units/1.47m by mouth.     No current facility-administered medications for this visit.    REVIEW OF SYSTEMS:   Constitutional: Denies fevers, chills or abnormal night sweats Eyes: Denies blurriness of vision, double vision or watery eyes Ears, nose, mouth, throat, and face: Denies mucositis or sore throat Respiratory: Denies cough, dyspnea or wheezes Cardiovascular: Denies palpitation, chest discomfort or lower extremity swelling Gastrointestinal:  Denies nausea, heartburn or change in bowel habits Skin: Denies abnormal skin rashes Lymphatics: Denies new lymphadenopathy or easy bruising Neurological:Denies numbness, tingling or new weaknesses Behavioral/Psych: Mood is stable, no new changes  All other systems were reviewed with the patient and are negative.  PHYSICAL EXAMINATION: ECOG PERFORMANCE STATUS: 1 - Symptomatic but completely ambulatory Blood pressure 145/83 Heart rate 83 Respiration rate 18 Temperature 98.3  GENERAL:alert, no distress and comfortable SKIN: Careful examination of her skin reveals multiple moles but no suspicious lesions elsewhere  EYES: normal, conjunctiva are pink and non-injected, sclera clear OROPHARYNX:no exudate, no erythema and lips, buccal mucosa, and tongue normal  NECK: supple, thyroid normal size, non-tender, without nodularity LYMPH:  no palpable lymphadenopathy in the cervical, axillary or inguinal LUNGS: clear to auscultation and percussion with normal breathing effort HEART: regular rate & rhythm and no murmurs and no lower extremity edema ABDOMEN:abdomen soft, non-tender and normal bowel sounds Musculoskeletal:no cyanosis of digits and no clubbing  PSYCH: alert & oriented x 3 with fluent speech NEURO: no focal motor/sensory deficits  LABORATORY DATA:  I have reviewed the data as listed Lab Results  Component Value Date   WBC 5.3 07/26/2020   HGB 11.8 07/26/2020   HCT 35.5 07/26/2020   MCV  89.0 07/26/2020   PLT 305 07/26/2020   Recent Labs    05/04/20 1054 08/23/20 1048 11/15/20 0911  NA 139 140 142  K 3.7 3.3* 3.5  CL 99 100 101  CO2 _0 GLUCOSE 80 79 78  BUN _1 CREATININE 0.63 0.82 0.63  CALCIUM 9.5 9.9 9.7  GFRNONAA  --  77  --   GFRAA  --  89  --

## 2020-12-17 NOTE — Assessment & Plan Note (Addendum)
I reviewed the general approach to melanoma with the patient She is scheduled for wide local excision and sentinel lymph node biopsy PET CT scan has been scheduled next week We discussed the importance of proper staging Given the depth of her melanoma and presence of ulceration and other adverse features including lymphovascular invasion, I would highly recommend the patient to consider adjuvant treatment with immunotherapy However, I am concerned about her chronic use of prednisone that would be considered somewhat contraindicated to receive immunotherapy I recommend prednisone taper slowly The patient would benefit from genetic counseling in the future given strong family history of melanoma The patient is instructed for aggressive skin protection in the meantime I will refer her to see dermatologist for formal evaluation in the future The rest of her examination today is benign I will get pathologist to add BRAF mutation studies to her recent pathology sample

## 2020-12-17 NOTE — Progress Notes (Signed)
GYNECOLOGIC ONCOLOGY NEW PATIENT CONSULTATION   Patient Name: Ariel Rios  Patient Age: 65 y.o. Date of Service: 12/17/20 Referring Provider: Sumner Boast MD  Primary Care Provider: [name], [name], MD Consulting Provider: [name], MD   Assessment/Plan:  Postmenopausal patient with primary vulvar melanoma.  We discussed the pathology from her recent biopsy/excision of the vulvar lesion. Vulvar melanoma is a rare disease and much of how we treat it follows the guidelines for cutaneous melanoma. For vulva melanomas that are amenable to surgery, this is generally the preferred treatment, with a wide local excision to remove the tumor with an appropriate margin. Clinical tumor margin is guided by depth of invasion.  Given a Breslow thickness/depth of >90mm, Ariel Rios will need a 2cm clinical margin around the resected area (which had negative margins although close). I think this will be feasible although given proximity to the clitoris, we discussed the possible need for sacrifice of the clitoris for adequate margins. The patient was understanding of this. Sentinel lymph node biopsy is usually attempted to have the prognostic (and thus therapeutic) information of whether there is lymph node involvement. We would plan to proceed with SLN biopsy. Given resection of the lesion prior to today, I'm dependent on the incision closure site to determine proximity to the midline. I suspect that the lesion itself was within 2cm of the midline and thus likely most appropriate for bilateral SLN assessment. I will reach out to [facility] to work on scheduling a joint procedure with surgical oncology for wide local excision/modified partial radical vulvectomy with sentinel lymph node assessment.  I will order a PET for evaluation of metastatic disease prior to surgery. I do not see any other vulvar or vaginal lesions on my exam today. Given risk factors identified on the initial resection ([place] depth, mitotic  count, LVSI, and ulceration), I reached out to Dr. Alvy Bimler about seeing the patient while Ariel Rios was here for today's visit in the event that Ariel Rios requires adjuvant treatment after surgery.  With regards to her surgery, Ariel Rios is currently on a steroid taper (10mg  a day now) in the setting of her newly diagnosed PMR. Ideally, Ariel Rios would be off of the steroid taper completely before surgery.   We discussed the role that an inherited genetic mutation may have played in the development of melanoma (given family history).  A copy of this note was sent to the patient's referring provider.   75 minutes of total time was spent for this patient encounter, including preparation, face-to-face counseling with the patient and coordination of care, and documentation of the encounter.   [name], MD  Division of Gynecologic Oncology  Department of Obstetrics and Gynecology  [facility]  ___________________________________________  Chief Complaint: Chief Complaint  Patient presents with  . Melanoma of vulva ([place])    History of Present Illness:  Ariel Rios is a 65 y.o. y.o. female who is seen in consultation at the request of Dr. Talbert Nan for an evaluation of newly diagnosed vulvar melanoma.  Patient reports beginning in December or January noticing what Ariel Rios describes as either blood in her urine or some discharge on her underwear.  Ariel Rios describes this being similar to "watercolors" in terms of the market left on her underwear.  Earlier this year, after a bowel movement, Ariel Rios noted bright red blood on the toilet paper when Ariel Rios wiped.  Ariel Rios was scheduled to see her primary care doctor made an appointment for her annual physical exam after taking  a mirror to look at her vulva and noting a pencil eraser sized gray growth.  Ariel Rios saw Dr. [name] and had an exam and Pap test.  Ariel Rios was subsequently referred to Dr. Talbert Nan and underwent a biopsy with removal of the vulvar lesion on  3/15.  Ariel Rios denies any other symptoms related to this lesion including pain, pruritus.  Ariel Rios never appreciated the mass on exam when showering.  Ariel Rios reports a good appetite, denies any nausea or emesis.  Reports normal bowel and bladder function.  Patient lives in [place] with her husband.  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  Patient's history is notable for Polymyalgia rheumatica diagnosed in October November.  Patient is on prednisone for this.  Ariel Rios takes 10 mg daily for this.  PAST MEDICAL HISTORY:  Past Medical History:  Diagnosis Date  . BMI 34.0-34.9,adult   . Melanotic macule of vulvar labia   . Migraine   . Polymyalgia rheumatica ([place])   . Venous insufficiency      PAST SURGICAL HISTORY:  History reviewed. No pertinent surgical history.  OB/GYN HISTORY:  OB History  Gravida Para Term Preterm AB Living  2 2 2     2   SAB IAB Ectopic Multiple Live Births          2    # Outcome Date GA Lbr Len/2nd Weight Sex Delivery Anes PTL Lv  2 Term     M Vag-Spont   LIV  1 Term     F Vag-Spont   LIV    No LMP recorded. Patient is postmenopausal.  Age at menarche: 68 Age at menopause: 80 Hx of HRT: Denies Hx of STDs: Denies Last pap: 2022 History of abnormal pap smears: Reports some history of abnormal Paps in the remote past, never needed more follow-up than a repeat Pap test  SCREENING STUDIES:  Last mammogram: 2018  Last colonoscopy: 2009  MEDICATIONS: Outpatient Encounter Medications as of 12/17/2020  Medication Sig  . hydrochlorothiazide (HYDRODIURIL) 25 MG tablet Take 1 tablet (25 mg total) by mouth daily as needed.  . predniSONE (DELTASONE) 10 MG tablet Take 1 tablet (10 mg total) by mouth daily with breakfast. DUE FOR FOLLOW UP in 10/2020.  . vitamin B-12 (CYANOCOBALAMIN) 50 MCG tablet Take 50  mcg by mouth daily.  [name] VITAMIN D, CHOLECALCIFEROL, PO Take 2,000 Int'l Units/1.38m2 by mouth.   No facility-administered encounter medications on file as of 12/17/2020.    ALLERGIES:  No Known Allergies   FAMILY HISTORY:  Family History  Problem Relation Age of Onset  . Diabetes Mother   . Melanoma Mother   . AVM Father   . Breast cancer Neg Hx   . Ovarian cancer Neg Hx   . Colon cancer Neg Hx   . Uterine cancer Neg Hx      SOCIAL HISTORY:  Social Connections: Not on file    REVIEW OF SYSTEMS:  Denies appetite changes, fevers, chills, fatigue, unexplained weight changes. Denies hearing loss, neck lumps or masses, mouth sores, ringing in ears or voice changes. Denies cough or wheezing.  Denies shortness of breath. Denies chest pain or palpitations. Denies leg swelling. Denies abdominal distention, pain, blood in stools, constipation, diarrhea, nausea, vomiting, or early satiety. Denies pain with intercourse, dysuria, frequency, hematuria or incontinence. Denies hot flashes, pelvic pain, vaginal bleeding  or vaginal discharge.   Denies joint pain, back pain or muscle pain/cramps. Denies itching, rash, or wounds. Denies dizziness, headaches, numbness or seizures. Denies swollen lymph nodes or glands, denies easy bruising or bleeding. Denies anxiety, depression, confusion, or decreased concentration.  Physical Exam:  Vital Signs for this encounter:  Blood pressure (!) 145/83, pulse 83, temperature 98.3 F (36.8 C), temperature source Oral, resp. rate 18, weight 191 lb (86.6 kg), SpO2 100 %. Body mass index is 34.38 kg/m. General: Alert, oriented, no acute distress.  HEENT: Normocephalic, atraumatic. Sclera anicteric.  Chest: Clear to auscultation bilaterally. No wheezes, rhonchi, or rales. Cardiovascular: Regular rate and rhythm, no murmurs, rubs, or gallops.  Abdomen: Obese. Normoactive bowel sounds. Soft, nondistended, nontender to palpation. No masses or  hepatosplenomegaly appreciated. No palpable fluid wave.  Extremities: Grossly normal range of motion. Warm, well perfused. No edema bilaterally.  Skin: No rashes or lesions.  Lymphatics: No cervical, supraclavicular, or inguinal adenopathy.  GU:  External female genitalia notable for an approximately 2-3 cm suture line along the right upper lateral labia majora.  This is approximately 1-2 cm from the clitoris as well as from the midline.  Incision is healing well without erythema, induration, or exudate.  No additional lesions noted.  On speculum exam, mild vaginal atrophy is noted.  No vaginal lesions appreciated.  Cervix is normal in appearance without lesions or masses.  Uterus is small and mobile on bimanual exam.  LABORATORY AND RADIOLOGIC DATA:  Outside medical records were reviewed to synthesize the above history, along with the history and physical obtained during the visit.   Lab Results  Component Value Date   WBC 5.3 07/26/2020   HGB 11.8 07/26/2020   HCT 35.5 07/26/2020   PLT 305 07/26/2020   GLUCOSE 78 11/15/2020   CHOL 207 (H) 11/23/2020   TRIG 105.0 11/23/2020   HDL 74.30 11/23/2020   LDLDIRECT 150.4 10/21/2013   LDLCALC 112 (H) 11/23/2020   ALT 10 04/07/2016   AST 13 04/07/2016   NA 142 11/15/2020   K 3.5 11/15/2020   CL 101 11/15/2020   CREATININE 0.63 11/15/2020   BUN 17 11/15/2020   CO2 31 11/15/2020   TSH 4.07 05/04/2020   HGBA1C 5.7 11/23/2020   Vulvar bipsy 3/15: RIGHT VULVA, BIOPSY: PRIMARY MALIGNANT MELANOMA OF VULVA, MELANOMA  TABLE BELOW  MALIGNANT MELANOMA (AJCC 8TH EDITION):  PROCEDURE: BIOPSY  SPECIMEN ANATOMIC SITE: RIGHT VULVA  BRESLOW'S DEPTH/MAXIMUM TUMOR THICKNESS: 5.05 MM  CLARK/ANATOMIC LEVEL* IV  MARGINS:  PERIPHERAL: INVOLVED  DEEP: FREE  ULCERATION: PRESENT  MITOTIC INDEX: 11/mm2  LYMPHO-VASCULAR INVASION: PRESENT*  NEUTROTROPISM: ABSENT  TUMOR-INFILTRATING LYMPHOCYTES: NON-BRISK  LYMPH NODES: N/A  PATHOLOGIC STAGE: PT4B NX MX   *VULVAR MELANOMAS MAY NOT HAVE THE SAME BEHAVIOR STAGE FOR STAGE AS  SUPERFICIAL SPREADING CUTANEOUS MELANOMA, AND LANDMARKS SUCH AS  BRESLOW'S DEPTH MAY NOT APPLY. NONETHELESS, THIS SHOULD BE TREATED AS A  PT4B MELANOMA AND COMPLETE EXCISION, WITH POSSIBLE LYMPH NODE  EVALUATION, UNDERTAKEN. SEE COMMENT FOR IHC PROFILE.

## 2020-12-17 NOTE — Patient Instructions (Signed)
It was a pleasure meeting you today.  I will call you once we have the results of your PET scan.  In the meantime, I will work to get your surgery scheduled at [facility].  This will be a larger removal of tissue around the area that was already removed (this is called a partial radical vulvectomy) as well as a sentinel lymph node biopsy.

## 2020-12-17 NOTE — Progress Notes (Signed)
Requested BRAF testing on accession [phone] [id number] with [facility] via email.

## 2020-12-17 NOTE — Telephone Encounter (Signed)
Patient scheduled on 12/17/20 with Dr. Berline Lopes

## 2020-12-17 NOTE — Assessment & Plan Note (Signed)
She has been on chronic prednisone therapy since late last year for polymyalgia rheumatica The patient is in the course of prednisone taper She is concerned about flare of pain I recommend consideration for short-term pain management with narcotic prescription if needed to help her to be successful with prednisone taper She is willing to try I recommend she start reducing prednisone to 5 mg daily until her next visit with me in a few weeks I gave her a small prescription of oxycodone to take as needed I warned her about side effects of oxycodone including sedation and constipation We discussed narcotic refill policy

## 2020-12-20 ENCOUNTER — Telehealth: Payer: Self-pay | Admitting: *Deleted

## 2020-12-20 ENCOUNTER — Encounter: Payer: Self-pay | Admitting: Gynecologic Oncology

## 2020-12-20 NOTE — Telephone Encounter (Signed)
Fax records to [facility] for surgery

## 2020-12-23 ENCOUNTER — Telehealth: Payer: Self-pay | Admitting: *Deleted

## 2020-12-23 NOTE — Telephone Encounter (Signed)
Called and left the patient a message that her records where sent to [facility]. Explained that we have not heard anything regarding her surgery. Left the number for [facility] for the patient

## 2020-12-24 ENCOUNTER — Telehealth: Payer: Self-pay | Admitting: *Deleted

## 2020-12-24 NOTE — Telephone Encounter (Signed)
Returned the patient's my chart message; explained that her records have been fax to [facility]. Explained that our office has reached back out to [facility] this morning; waiting for an answer. Explained that our office will all her has soon has [facility] gets back with our office

## 2020-12-27 ENCOUNTER — Telehealth: Payer: Self-pay | Admitting: *Deleted

## 2020-12-27 NOTE — Telephone Encounter (Signed)
Sent in basket message to [facility], they are waiting until after her PET scan to schedule surgery. Called and explained this to the patient

## 2020-12-30 ENCOUNTER — Encounter: Payer: Self-pay | Admitting: Hematology and Oncology

## 2020-12-30 ENCOUNTER — Inpatient Hospital Stay
Payer: No Typology Code available for payment source | Attending: Gynecologic Oncology | Admitting: Hematology and Oncology

## 2020-12-30 ENCOUNTER — Other Ambulatory Visit (HOSPITAL_COMMUNITY): Payer: No Typology Code available for payment source

## 2020-12-30 ENCOUNTER — Other Ambulatory Visit: Payer: Self-pay

## 2020-12-30 ENCOUNTER — Ambulatory Visit (HOSPITAL_COMMUNITY)
Admission: RE | Admit: 2020-12-30 | Discharge: 2020-12-30 | Disposition: A | Discharge status: Home / Self Care | Payer: No Typology Code available for payment source | Source: Ambulatory Visit | Attending: Gynecologic Oncology | Admitting: Gynecologic Oncology

## 2020-12-30 DIAGNOSIS — I700 Atherosclerosis of aorta: Secondary | ICD-10-CM | POA: Insufficient documentation

## 2020-12-30 DIAGNOSIS — C519 Malignant neoplasm of vulva, unspecified: Secondary | ICD-10-CM | POA: Insufficient documentation

## 2020-12-30 DIAGNOSIS — K5730 Diverticulosis of large intestine without perforation or abscess without bleeding: Secondary | ICD-10-CM | POA: Insufficient documentation

## 2020-12-30 DIAGNOSIS — M353 Polymyalgia rheumatica: Secondary | ICD-10-CM

## 2020-12-30 DIAGNOSIS — K449 Diaphragmatic hernia without obstruction or gangrene: Secondary | ICD-10-CM | POA: Insufficient documentation

## 2020-12-30 LAB — GLUCOSE, CAPILLARY: Glucose-Capillary: 88 mg/dL (ref 70–99)

## 2020-12-30 MED ORDER — FLUDEOXYGLUCOSE F - 18 (FDG) INJECTION
9.5000 | Freq: Once | INTRAVENOUS | Status: AC | PRN
  Administered 2020-12-30: 9.5 via INTRAVENOUS

## 2020-12-30 MED ORDER — PREDNISONE 10 MG PO TABS: ORAL_TABLET | ORAL | 0 refills | Status: DC

## 2020-12-30 NOTE — Progress Notes (Signed)
[facility] OFFICE PROGRESS NOTE  Patient Care Team: [name], [name], MD as PCP - General (Family Medicine)  ASSESSMENT & PLAN:  Melanoma of vulva [facility]) I reviewed PET CT scan with the patient and her husband BRAF mutation study came back negative There are no clinical signs of metastatic disease She will be scheduled for wide local excision and sentinel lymph node biopsy at [facility] in the near future  Within the month after surgery, we can start adjuvant treatment I explained to the patient and her husband the rationale behind adjuvant treatment  I reviewed the general approach to melanoma with the patient Given the depth of her melanoma and presence of ulceration and other adverse features including lymphovascular invasion, I would highly recommend the patient to consider adjuvant treatment with immunotherapy The risks, benefits, side effects of immunotherapy were fully discussed with the patient and her husband and she is in agreement We will continue prednisone taper The patient would benefit from genetic counseling in the future given strong family history of melanoma The patient is instructed for aggressive skin protection in the meantime I will refer her to see dermatologist for formal evaluation in the future   PMR (polymyalgia rheumatica) ([place]) She is able to tolerate prednisone taper She has been taking prednisone 2.5 mg daily for 5 days without major flare of pain I gave the patient written instruction to start prednisone taper next week to 2.5 mg every other day until the end of the month and then stop If she has acute flare of polymyalgia rheumatica, she can take pain medicine as needed   No orders of the defined types were placed in this encounter.   All questions were answered. The patient knows to call the clinic with any problems, questions or concerns. The total time spent in the appointment was 30 minutes encounter with patients including review of  chart and various tests results, discussions about plan of care and coordination of care plan   [name], MD 12/30/2020 2:50 PM  INTERVAL HISTORY: Please see below for problem oriented charting. She returns with her husband for further follow-up She is doing well She is able to tolerate prednisone taper without difficulties  SUMMARY OF ONCOLOGIC HISTORY: Oncology History Overview Note  BRAF: undetectable (negative)   Melanoma of vulva ([place])  10/26/2020 Initial Diagnosis   She was noted to have bloody discharge in her underwear.  She denies pain   12/07/2020 Initial Biopsy   FINAL MICROSCOPIC DIAGNOSIS:   A.   RIGHT VULVA, BIOPSY: PRIMARY MALIGNANT MELANOMA OF VULVA, MELANOMA  TABLE BELOW  MALIGNANT MELANOMA (AJCC 8TH EDITION):  PROCEDURE: BIOPSY  SPECIMEN ANATOMIC SITE: RIGHT VULVA  BRESLOW'S DEPTH/MAXIMUM TUMOR THICKNESS: 5.05 MM  CLARK/ANATOMIC LEVEL* IV  MARGINS:  PERIPHERAL: INVOLVED  DEEP: FREE  ULCERATION: PRESENT  MITOTIC INDEX: 11/mm2  LYMPHO-VASCULAR INVASION: PRESENT*  NEUTROTROPISM: ABSENT  TUMOR-INFILTRATING LYMPHOCYTES: NON-BRISK  LYMPH NODES: N/A  PATHOLOGIC STAGE: PT4B NX MX  *VULVAR MELANOMAS MAY NOT HAVE THE SAME BEHAVIOR STAGE FOR STAGE AS SUPERFICIAL SPREADING CUTANEOUS MELANOMA, AND LANDMARKS SUCH AS BRESLOW'S DEPTH MAY NOT APPLY. NONETHELESS, THIS SHOULD BE TREATED AS A PT4B MELANOMA AND COMPLETE EXCISION, WITH POSSIBLE LYMPH NODE EVALUATION, UNDERTAKEN. SEE COMMENT FOR IHC PROFILE.    COMMENT:  Sections show confluent and nested growth of atypical melanocytes along vulvar epithelium in an in-situ melanoma growth pattern at the periphery, with a central large ulcerated nodule of similar atypical  melanocytes, consonant with primary vulvar melanoma. Notably, IHC was  performed to evaluate the lineage and is diagnostic for melanoma. Lesional melanocytes are positive with S100, MelanA, and show a high ki-67 uptake, and are negative with CDX-2, synaptophysin,  CK20, TTF-1, CK7, CD56, and lymphoid markers CD5, CD3, CD20 highlight only background lymphocytes.    12/17/2020 Initial Diagnosis   Melanoma of vulva ([place])   12/17/2020 Cancer Staging   Staging form: Melanoma of the Skin, AJCC 8th Edition - Clinical stage from 12/17/2020: Stage IIC (cT4b, cN0, cM0) - Signed by [name], MD on 12/17/2020 Stage prefix: Initial diagnosis   12/30/2020 PET scan   1. The only potential hypermetabolic finding of note is a small flat focus of accentuated metabolic activity along the anterior vaginal vestibule/perineum, maximum SUV 6.5. Most common cause for this type of appearance would be a trace amount of incontinence of urinary FDG, but given the proximity to the reported vulvar melanoma, this small focus is considered indeterminate. 2. Other imaging findings of potential clinical significance: Aortic Atherosclerosis (ICD10-I70.0). Small type 1 hiatal hernia. Palatine tonsilloliths. Colonic diverticulosis.   02/14/2021 -  Chemotherapy    Patient is on Treatment Plan: MELANOMA PEMBROLIZUMAB Q21D        REVIEW OF SYSTEMS:   Constitutional: Denies fevers, chills or abnormal weight loss Eyes: Denies blurriness of vision Ears, nose, mouth, throat, and face: Denies mucositis or sore throat Respiratory: Denies cough, dyspnea or wheezes Cardiovascular: Denies palpitation, chest discomfort or lower extremity swelling Gastrointestinal:  Denies nausea, heartburn or change in bowel habits Skin: Denies abnormal skin rashes Lymphatics: Denies new lymphadenopathy or easy bruising Neurological:Denies numbness, tingling or new weaknesses Behavioral/Psych: Mood is stable, no new changes  All other systems were reviewed with the patient and are negative.  I have reviewed the past medical history, past surgical history, social history and family history with the patient and they are unchanged from previous note.  ALLERGIES:  has No Known Allergies.  MEDICATIONS:  Current  Outpatient Medications  Medication Sig Dispense Refill  . hydrochlorothiazide (HYDRODIURIL) 25 MG tablet Take 1 tablet (25 mg total) by mouth daily as needed. 90 tablet 3  . oxyCODONE (OXY IR/ROXICODONE) 5 MG immediate release tablet TAKE 1 TABLET (5 MG TOTAL) BY MOUTH EVERY 6 (SIX) HOURS AS NEEDED FOR SEVERE PAIN. 30 tablet 0  . predniSONE (DELTASONE) 10 MG tablet Prednisone taper to be completed by end of April 2022 90 tablet 0  . vitamin B-12 (CYANOCOBALAMIN) 50 MCG tablet Take 50 mcg by mouth daily.    [name] VITAMIN D, CHOLECALCIFEROL, PO Take 2,000 Int'l Units/1.67m by mouth.     No current facility-administered medications for this visit.    PHYSICAL EXAMINATION: ECOG PERFORMANCE STATUS: 1 - Symptomatic but completely ambulatory  Vitals:   12/30/20 1355  BP: 131/68  Pulse: 75  Resp: 18  Temp: 97.9 F (36.6 C)  SpO2: 98%   Filed Weights   12/30/20 1355  Weight: 187 lb 3.2 oz (84.9 kg)    GENERAL:alert, no distress and comfortable NEURO: alert & oriented x 3 with fluent speech, no focal motor/sensory deficits  LABORATORY DATA:  I have reviewed the data as listed    Component Value Date/Time   NA 142 11/15/2020 0911   K 3.5 11/15/2020 0911   CL 101 11/15/2020 0911   CO2 31 11/15/2020 0911   GLUCOSE 78 11/15/2020 0911   BUN 17 11/15/2020 0911   CREATININE 0.63 11/15/2020 0911   CREATININE 0.82 08/23/2020 1048   CALCIUM 9.7 11/15/2020 0911   PROT 7.2 04/07/2016  0826   ALBUMIN 4.1 04/07/2016 0826   AST 13 04/07/2016 0826   ALT 10 04/07/2016 0826   ALKPHOS 52 04/07/2016 0826   BILITOT 0.9 04/07/2016 0826   GFRNONAA 77 08/23/2020 1048   GFRAA 89 08/23/2020 1048    No results found for: SPEP, UPEP  Lab Results  Component Value Date   WBC 5.3 07/26/2020   NEUTROABS 3,286 07/26/2020   HGB 11.8 07/26/2020   HCT 35.5 07/26/2020   MCV 89.0 07/26/2020   PLT 305 07/26/2020      Chemistry      Component Value Date/Time   NA 142 11/15/2020 0911   K 3.5 11/15/2020  0911   CL 101 11/15/2020 0911   CO2 31 11/15/2020 0911   BUN 17 11/15/2020 0911   CREATININE 0.63 11/15/2020 0911   CREATININE 0.82 08/23/2020 1048      Component Value Date/Time   CALCIUM 9.7 11/15/2020 0911   ALKPHOS 52 04/07/2016 0826   AST 13 04/07/2016 0826   ALT 10 04/07/2016 0826   BILITOT 0.9 04/07/2016 0826       RADIOGRAPHIC STUDIES: I have personally reviewed the radiological images as listed and agreed with the findings in the report. NM PET Image Initial (PI) Whole Body  Result Date: 12/30/2020 CLINICAL DATA:  Initial treatment strategy for vulvar melanoma. EXAM: NUCLEAR MEDICINE PET WHOLE BODY TECHNIQUE: 9.5 mCi F-18 FDG was injected intravenously. Full-ring PET imaging was performed from the head to foot after the radiotracer. CT data was obtained and used for attenuation correction and anatomic localization. Fasting blood glucose: 88 mg/dl COMPARISON:  CT abdomen 03/12/2011 FINDINGS: Mediastinal blood pool activity: SUV max 2.7 HEAD/NECK: No significant abnormal hypermetabolic activity in this region. Incidental CT findings: Speckled dystrophic calcifications along the palatine tonsils (tonsilloliths). CHEST: No significant abnormal hypermetabolic activity in this region. Incidental CT findings: Atherosclerotic calcification of the aortic arch. Small type 1 hiatal hernia. ABDOMEN/PELVIS: Photopenic cyst in the left hepatic lobe. Along the vestibule/perineum in the midline, a small focus of a accentuated activity is present maximum SUV 6.5 without CT abnormality. This could well be due to a trace amount of incontinence of urinary FDG, the given the proximity to the reported vulvar melanoma, this small flat focus of activity is considered indeterminate. No regional hypermetabolic adenopathy. Incidental CT findings: Aortoiliac atherosclerotic vascular disease. Renal peripelvic cysts, left greater than right. Fairly widespread colonic diverticulosis most concentrated in the descending  and sigmoid colon. SKELETON: No significant abnormal hypermetabolic activity in this region. Incidental CT findings: Thoracic spondylosis. EXTREMITIES: No significant abnormal hypermetabolic activity in this region. Incidental CT findings: none IMPRESSION: 1. The only potential hypermetabolic finding of note is a small flat focus of accentuated metabolic activity along the anterior vaginal vestibule/perineum, maximum SUV 6.5. Most common cause for this type of appearance would be a trace amount of incontinence of urinary FDG, but given the proximity to the reported vulvar melanoma, this small focus is considered indeterminate. 2. Other imaging findings of potential clinical significance: Aortic Atherosclerosis (ICD10-I70.0). Small type 1 hiatal hernia. Palatine tonsilloliths. Colonic diverticulosis. Electronically Signed   By: Van Clines M.D.   On: 12/30/2020 13:23

## 2020-12-30 NOTE — Assessment & Plan Note (Signed)
I reviewed PET CT scan with the patient and her husband BRAF mutation study came back negative There are no clinical signs of metastatic disease She will be scheduled for wide local excision and sentinel lymph node biopsy at [facility] in the near future  Within the month after surgery, we can start adjuvant treatment I explained to the patient and her husband the rationale behind adjuvant treatment  I reviewed the general approach to melanoma with the patient Given the depth of her melanoma and presence of ulceration and other adverse features including lymphovascular invasion, I would highly recommend the patient to consider adjuvant treatment with immunotherapy The risks, benefits, side effects of immunotherapy were fully discussed with the patient and her husband and she is in agreement We will continue prednisone taper The patient would benefit from genetic counseling in the future given strong family history of melanoma The patient is instructed for aggressive skin protection in the meantime I will refer her to see dermatologist for formal evaluation in the future

## 2020-12-30 NOTE — Assessment & Plan Note (Signed)
She is able to tolerate prednisone taper She has been taking prednisone 2.5 mg daily for 5 days without major flare of pain I gave the patient written instruction to start prednisone taper next week to 2.5 mg every other day until the end of the month and then stop If she has acute flare of polymyalgia rheumatica, she can take pain medicine as needed

## 2020-12-30 NOTE — Progress Notes (Signed)
DISCONTINUE SELECTED CLINICAL TRIAL - Melanoma and Other Skin Cancers  Other Clinical Trial: pembrolizumab  **Trial eligibility and accrual should be confirmed by your research team**  REASON: Other Reason PRIOR TREATMENT: Other Trial - pembrolizumab  Melanoma and Other Skin Cancers - No Medical Intervention - Off Treatment.  Patient Characteristics: Disease Classification: Melanoma

## 2020-12-30 NOTE — Progress Notes (Signed)
DISCONTINUE ON PATHWAY REGIMEN - Melanoma and Other Skin Cancers  No Medical Intervention - Off Treatment.  REASON: Other Reason PRIOR TREATMENT: Off Treatment  START OFF PATHWAY REGIMEN - Melanoma and Other Skin Cancers   [id number]:Pembrolizumab 200 mg IV D1 q21 Days:   A cycle is every 21 days:     Pembrolizumab   **Always confirm dose/schedule in your pharmacy ordering system**  Patient Characteristics: Melanoma, Cutaneous/Unknown Primary, Postoperative without Neoadjuvant Therapy (Pathologic Staging), pT4a - pT4b, pN0 Disease Classification: Melanoma Disease Subtype: Cutaneous BRAF V600 Mutation Status: BRAF V600 Wild Type (No Mutation) Therapeutic Status: Postoperative without Neoadjuvant Therapy (Pathologic Staging) AJCC T Category: pT4b AJCC N Category: pN0 AJCC M Category: cM0 AJCC 8 Stage Grouping: IIC Intent of Therapy: Curative Intent, Discussed with Patient

## 2020-12-30 NOTE — Progress Notes (Addendum)
CLINICAL TRIAL SELECTED - Melanoma and Other Skin Cancers  No Change  Continue With Treatment as Ordered.  Original Decision Date/Time: 12/30/2020 14:33  Other Clinical Trial: pembrolizumab  **Trial eligibility and accrual should be confirmed by your research team**  Patient Characteristics: Melanoma, Cutaneous/Unknown Primary, Postoperative without Neoadjuvant Therapy (Pathologic Staging), pT4a - pT4b, pN0 Disease Classification: Melanoma Disease Subtype: Cutaneous BRAF V600 Mutation Status: BRAF V600 Wild Type (No Mutation) Therapeutic Status: Postoperative without Neoadjuvant Therapy (Pathologic Staging) AJCC T Category: pT4 AJCC N Category: pN0 AJCC M Category: cM0 AJCC 8 Stage Grouping: Unknown Intent of Therapy: Curative Intent, Discussed with Patient

## 2021-01-17 ENCOUNTER — Other Ambulatory Visit (HOSPITAL_COMMUNITY): Payer: Self-pay

## 2021-01-17 MED ORDER — ACETAMINOPHEN 500 MG PO TABS: ORAL_TABLET | ORAL | 2 refills | Status: DC

## 2021-01-17 MED ORDER — IBUPROFEN 600 MG PO TABS
ORAL_TABLET | ORAL | 0 refills | Status: DC
  Filled 2021-01-17: qty 60, 15d supply, fill #0

## 2021-01-17 MED ORDER — OXYCODONE HCL 5 MG PO TABS
ORAL_TABLET | ORAL | 0 refills | Status: DC
  Filled 2021-01-17: qty 10, 2d supply, fill #0

## 2021-01-19 ENCOUNTER — Telehealth: Payer: Self-pay | Admitting: *Deleted

## 2021-01-19 ENCOUNTER — Encounter: Payer: Self-pay | Admitting: Hematology and Oncology

## 2021-01-19 ENCOUNTER — Encounter: Payer: Self-pay | Admitting: Gynecologic Oncology

## 2021-01-19 HISTORY — PX: VULVECTOMY: SHX1086

## 2021-01-19 NOTE — Telephone Encounter (Signed)
Scheduled the patient for a post op appt and called the patient with the appt

## 2021-01-25 ENCOUNTER — Other Ambulatory Visit: Payer: Self-pay | Admitting: Hematology and Oncology

## 2021-01-25 ENCOUNTER — Telehealth: Payer: Self-pay | Admitting: Hematology and Oncology

## 2021-01-25 NOTE — Telephone Encounter (Signed)
Scheduled appt per 5/3 sch msg. Pt aware.  

## 2021-02-06 NOTE — Progress Notes (Signed)
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  She denies pain   12/07/2020 Initial Biopsy   FINAL MICROSCOPIC DIAGNOSIS:   A.   RIGHT VULVA, BIOPSY: PRIMARY MALIGNANT MELANOMA OF VULVA, MELANOMA  TABLE BELOW  MALIGNANT MELANOMA (AJCC 8TH EDITION):  PROCEDURE: BIOPSY  SPECIMEN ANATOMIC SITE: RIGHT VULVA  BRESLOW'S DEPTH/MAXIMUM TUMOR THICKNESS: 5.05 MM  CLARK/ANATOMIC LEVEL* IV  MARGINS:  PERIPHERAL: INVOLVED  DEEP: FREE  ULCERATION: PRESENT  MITOTIC INDEX: 11/mm2  LYMPHO-VASCULAR INVASION: PRESENT*  NEUTROTROPISM: ABSENT  TUMOR-INFILTRATING LYMPHOCYTES: NON-BRISK  LYMPH NODES: N/A  PATHOLOGIC STAGE: PT4B NX MX  *VULVAR MELANOMAS MAY NOT HAVE THE SAME BEHAVIOR STAGE FOR STAGE AS SUPERFICIAL SPREADING CUTANEOUS MELANOMA, AND LANDMARKS SUCH AS BRESLOW'S DEPTH MAY NOT APPLY. NONETHELESS, THIS SHOULD BE TREATED AS A PT4B MELANOMA AND COMPLETE EXCISION, WITH POSSIBLE LYMPH NODE EVALUATION, UNDERTAKEN. SEE COMMENT FOR IHC PROFILE.    COMMENT:  Sections show confluent and nested growth of atypical melanocytes along vulvar epithelium in an in-situ melanoma growth pattern at the periphery, with a central large ulcerated nodule of similar atypical  melanocytes, consonant with primary vulvar melanoma. Notably, IHC was performed to evaluate the lineage and is diagnostic for melanoma. Lesional melanocytes are positive with S100, MelanA, and show a high ki-67 uptake, and are negative with CDX-2, synaptophysin, CK20, TTF-1, CK7, CD56, and lymphoid markers CD5, CD3, CD20 highlight only background lymphocytes.    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 The only potential hypermetabolic finding of note is a small flat focus of accentuated metabolic activity along the anterior vaginal vestibule/perineum, maximum SUV 6.5. Most common cause for this type of appearance would be a trace amount of incontinence of urinary FDG, but given the proximity to the reported vulvar melanoma, this small focus is considered indeterminate. 2. Other imaging findings of potential clinical significance: Aortic Atherosclerosis (ICD10-I70.0). Small type 1 hiatal hernia. Palatine tonsilloliths. Colonic diverticulosis.   01/18/2021 Surgery   Preoperative Diagnosis: Vulvar melanoma  Postoperative Diagnosis: Same  Procedures performed: Wide radical vulvectomy, R inguinofemoral sentinel LN biopsy  Attending Surgeon: [name], MD  Fellow: [name]  Resident: Melony Overly, MD; Darrin Nipper, MD  Anesthesia: General endotracheal  Findings:  1. Vulva with biopsy site changes at R labia minora adjacent to the clitoris. No visible tumor. 2. Inguinofemoral nodes not clinically enlarged or abnormal.   Specimens:  1. R inguinal sentinel LN, hot and blue 2. Portion of vulva 3. Superior deep margin    01/18/2021 Pathology Results   Only melanoma in situ seen in final resection, SLN negative   02/14/2021 -  Chemotherapy    Patient is on Treatment Plan: MELANOMA PEMBROLIZUMAB Q21D        Interval History: The patient presents today for follow-up after surgery on 26 April.  At that time she underwent wide radical excision of her vulvar melanoma as well as right sentinel lymph node biopsy.  Final pathology showed only melanoma in situ.  She is overall healing very  well.  She had some minimal discharge after surgery, but denies any further discharge or bleeding since then.  She  endorses a good appetite without nausea or emesis.  She reports regular bowel and bladder function.  Past Medical/Surgical History: Past Medical History:  Diagnosis Date  . BMI 34.0-34.9,adult   . Melanotic macule of vulvar labia   . Migraine   . Polymyalgia rheumatica ([place])   . Venous insufficiency     History reviewed. No pertinent surgical history.  Family History  Problem Relation Age of Onset  . Diabetes Mother   . Melanoma Mother   . AVM Father   . Breast cancer Neg Hx   . Ovarian cancer Neg Hx   . Colon cancer Neg Hx   . Uterine cancer Neg Hx     Social History   Socioeconomic History  . Marital status: Married    Spouse name: Not on file  . Number of children: 2  . Years of education: Not on file  . Highest education level: Not on file  Occupational History  . Occupation: Economist  . Occupation: stocking  Tobacco Use  . Smoking status: Never Smoker  . Smokeless tobacco: Never Used  Vaping Use  . Vaping Use: Never used  Substance and Sexual Activity  . Alcohol use: Yes    Comment: occasional  . Drug use: No  . Sexual activity: Not Currently  Other Topics Concern  . Not on file  Social History Narrative  . Not on file   Social Determinants of Health   Financial Resource Strain: Not on file  Food Insecurity: Not on file  Transportation Needs: Not on file  Physical Activity: Not on file  Stress: Not on file  Social Connections: Not on file    Current Medications:  Current Outpatient Medications:  .  acetaminophen (ACETAMINOPHEN EXTRA STRENGTH) 500 MG tablet, Take 2 tablets by mouth every 6 hours as needed for pain, Disp: 100 tablet, Rfl: 2 .  hydrochlorothiazide (HYDRODIURIL) 25 MG tablet, Take 1 tablet (25 mg total) by mouth daily as needed., Disp: 90 tablet, Rfl: 3 .  ibuprofen (ADVIL) 600 MG tablet, Take 1 tablet by mouth every 6 hours as needed for pain, Disp: 60 tablet, Rfl: 0 .  oxyCODONE (OXY IR/ROXICODONE) 5 MG immediate release  tablet, TAKE 1 TABLET (5 MG TOTAL) BY MOUTH EVERY 6 (SIX) HOURS AS NEEDED FOR SEVERE PAIN., Disp: 30 tablet, Rfl: 0 .  vitamin B-12 (CYANOCOBALAMIN) 50 MCG tablet, Take 50 mcg by mouth daily., Disp: , Rfl:  .  VITAMIN D, CHOLECALCIFEROL, PO, Take 2,000 Int'l Units/1.72m by mouth., Disp: , Rfl:  .  ondansetron (ZOFRAN) 8 MG tablet, Take 1 tablet (8 mg total) by mouth 2 times daily as needed for nausea or vomiting., Disp: 30 tablet, Rfl: 1 .  prochlorperazine (COMPAZINE) 10 MG tablet, Take 1 tablet (10 mg total) by mouth every 6 (six) hours as needed for nausea or vomiting., Disp: 30 tablet, Rfl: 1  Review of Systems: Denies appetite changes, fevers, chills, fatigue, unexplained weight changes. Denies hearing loss, neck lumps or masses, mouth sores, ringing in ears or voice changes. Denies cough or wheezing.  Denies shortness of breath. Denies chest pain or palpitations. Denies leg swelling. Denies abdominal distention, pain, blood in stools, constipation, diarrhea, nausea, vomiting, or early satiety. Denies pain with intercourse, dysuria, frequency, hematuria or incontinence. Denies hot flashes, pelvic pain, vaginal bleeding or vaginal discharge.   Denies joint pain, back pain or muscle pain/cramps.  Denies itching, rash, or wounds. Denies dizziness, headaches, numbness or seizures. Denies swollen lymph nodes or glands, denies easy bruising or bleeding. Denies anxiety, depression, confusion, or decreased concentration.  Physical Exam: BP 132/76 (BP Location: Left Arm, Patient Position: Sitting)   Pulse 90   Temp (!) 97.1 F (36.2 C) (Temporal)   Resp 18   Ht 5' 2" (1.575 m)   Wt 189 lb (85.7 kg)   SpO2 97%   BMI 34.57 kg/m  General: Alert, oriented, no acute distress. HEENT: Normocephalic, atraumatic, sclera anicteric. Chest: Unlabored breathing on room air. Abdomen: Well-healing right lower quadrant incision, Dermabond removed. Extremities: Grossly normal range of motion.  Warm,  well perfused.  No edema bilaterally. GU: Healing vulvar incision, no significant erythema, induration, or exudate.  Suture still visible.  Laboratory & Radiologic Studies: None new  Assessment & Plan: Ariel Rios is a 65 y.o. woman with Stage IIC vulvar melanoma who presents for postoperative follow-up.  The patient is overall healing well from a postoperative standpoint.  We discussed continued activity restrictions and limitations.  Given suture still visible, I'm going to see her back in 4 weeks for a final incision check.  Patient met with Dr. Alvy Bimler today to discuss adjuvant treatment given the depth of invasion of her lesion as well as pathologic features.  Plan is for adjuvant immunotherapy.  26 minutes of total time was spent for this patient encounter, including preparation, face-to-face counseling with the patient and coordination of care, and documentation of the encounter.  [name], MD  Division of Gynecologic Oncology  Department of Obstetrics and Gynecology  [facility]

## 2021-02-07 ENCOUNTER — Other Ambulatory Visit: Payer: Self-pay | Admitting: Hematology and Oncology

## 2021-02-07 ENCOUNTER — Other Ambulatory Visit: Payer: Self-pay

## 2021-02-07 ENCOUNTER — Inpatient Hospital Stay: Payer: No Typology Code available for payment source

## 2021-02-07 ENCOUNTER — Other Ambulatory Visit (HOSPITAL_COMMUNITY): Payer: Self-pay

## 2021-02-07 ENCOUNTER — Encounter: Payer: Self-pay | Admitting: Hematology and Oncology

## 2021-02-07 ENCOUNTER — Inpatient Hospital Stay: Payer: No Typology Code available for payment source | Attending: Gynecologic Oncology | Admitting: Gynecologic Oncology

## 2021-02-07 ENCOUNTER — Inpatient Hospital Stay: Payer: No Typology Code available for payment source | Admitting: Hematology and Oncology

## 2021-02-07 ENCOUNTER — Encounter: Payer: Self-pay | Admitting: Gynecologic Oncology

## 2021-02-07 VITALS — BP 132/76 | HR 90 | Temp 97.1°F | Resp 18 | Ht 62.0 in | Wt 189.0 lb

## 2021-02-07 VITALS — BP 131/76 | HR 90 | Temp 97.1°F | Resp 18 | Ht 62.0 in | Wt 189.6 lb

## 2021-02-07 DIAGNOSIS — Z79899 Other long term (current) drug therapy: Secondary | ICD-10-CM | POA: Diagnosis not present

## 2021-02-07 DIAGNOSIS — Z5112 Encounter for antineoplastic immunotherapy: Secondary | ICD-10-CM | POA: Diagnosis present

## 2021-02-07 DIAGNOSIS — M353 Polymyalgia rheumatica: Secondary | ICD-10-CM

## 2021-02-07 DIAGNOSIS — C519 Malignant neoplasm of vulva, unspecified: Secondary | ICD-10-CM | POA: Insufficient documentation

## 2021-02-07 DIAGNOSIS — Z9079 Acquired absence of other genital organ(s): Secondary | ICD-10-CM

## 2021-02-07 LAB — CBC WITH DIFFERENTIAL (CANCER CENTER ONLY)
Abs Immature Granulocytes: 0 10*3/uL (ref 0.00–0.07)
Basophils Absolute: 0 10*3/uL (ref 0.0–0.1)
Basophils Relative: 1 %
Eosinophils Absolute: 0.1 10*3/uL (ref 0.0–0.5)
Eosinophils Relative: 2 %
HCT: 38.3 % (ref 36.0–46.0)
Hemoglobin: 12.8 g/dL (ref 12.0–15.0)
Immature Granulocytes: 0 %
Lymphocytes Relative: 25 %
Lymphs Abs: 1.5 10*3/uL (ref 0.7–4.0)
MCH: 29.4 pg (ref 26.0–34.0)
MCHC: 33.4 g/dL (ref 30.0–36.0)
MCV: 87.8 fL (ref 80.0–100.0)
Monocytes Absolute: 0.4 10*3/uL (ref 0.1–1.0)
Monocytes Relative: 6 %
Neutro Abs: 4 10*3/uL (ref 1.7–7.7)
Neutrophils Relative %: 66 %
Platelet Count: 310 10*3/uL (ref 150–400)
RBC: 4.36 MIL/uL (ref 3.87–5.11)
RDW: 13 % (ref 11.5–15.5)
WBC Count: 6.1 10*3/uL (ref 4.0–10.5)
nRBC: 0 % (ref 0.0–0.2)

## 2021-02-07 LAB — CMP (CANCER CENTER ONLY)
ALT: 22 U/L (ref 0–44)
AST: 21 U/L (ref 15–41)
Albumin: 4 g/dL (ref 3.5–5.0)
Alkaline Phosphatase: 63 U/L (ref 38–126)
Anion gap: 9 (ref 5–15)
BUN: 14 mg/dL (ref 8–23)
CO2: 28 mmol/L (ref 22–32)
Calcium: 9.6 mg/dL (ref 8.9–10.3)
Chloride: 102 mmol/L (ref 98–111)
Creatinine: 0.66 mg/dL (ref 0.44–1.00)
GFR, Estimated: >60 mL/min (ref >60)
Glucose, Bld: 97 mg/dL (ref 70–99)
Potassium: 3.4 mmol/L — ABNORMAL LOW (ref 3.5–5.1)
Sodium: 139 mmol/L (ref 135–145)
Total Bilirubin: 0.8 mg/dL (ref 0.3–1.2)
Total Protein: 7.2 g/dL (ref 6.5–8.1)

## 2021-02-07 LAB — TSH: TSH: 2.994 u[IU]/mL (ref 0.308–3.960)

## 2021-02-07 MED ORDER — ONDANSETRON HCL 8 MG PO TABS
8.0000 mg | ORAL_TABLET | Freq: Two times a day (BID) | ORAL | 1 refills | Status: DC | PRN
  Filled 2021-02-07: qty 18, 21d supply, fill #0

## 2021-02-07 MED ORDER — PROCHLORPERAZINE MALEATE 10 MG PO TABS
10.0000 mg | ORAL_TABLET | Freq: Four times a day (QID) | ORAL | 1 refills | Status: DC | PRN
  Filled 2021-02-07: qty 30, 8d supply, fill #0

## 2021-02-07 NOTE — Patient Instructions (Signed)
You are healing well from surgery!  I like to see you in 4 weeks just to make sure that your incision continues to heal.  There is still some stitches in place.  You can slowly increase your activity as you feel able to but I would be careful about putting a lot of pressure on your bottom or sitting for long periods.

## 2021-02-07 NOTE — Progress Notes (Signed)
Met with patient at registration to introduce myself as Arboriculturist and to offer available resources.  Discussed one-time $1000 Radio broadcast assistant to assist with personal expenses while going through treatment. Based on verbal income guidelines, her household exceeds income limits. She verbalized understanding.  Asked about ded/OOP to determine if copay assistance is needed. Patient still has several claims processing from outside system so she may have met them at this point. Advised to contact me should she receive a bill from here if she needs me to see if she is being billed for Beryle Flock which has assistance through [employer].       She has my card for any additional financial questions or concerns.

## 2021-02-07 NOTE — Assessment & Plan Note (Signed)
I have reviewed her surgical report and pathology report We discussed the risk, benefits, side effects of pembrolizumab We have successfully tapered her off prednisone therapy We will get her scheduled and start treatment as soon as possible She is aware that treatment duration is for 1 year  I reviewed the general approach to melanoma with the patient Given the depth of her melanoma and presence of ulceration and other adverse features including lymphovascular invasion, I would highly recommend the patient to consider adjuvant treatment with immunotherapy The patient would benefit from genetic counseling in the future given strong family history of melanoma The patient is instructed for aggressive skin protection in the meantime I will refer her to see dermatologist for formal evaluation in the future

## 2021-02-07 NOTE — Assessment & Plan Note (Signed)
Since discontinuation of prednisone, she started to have slightly worsening joint pain She has not been taking oxycodone consistently  We have extensive discussions about chronic pain management We discussed the importance of her taking oxycodone as needed along with acetaminophen as needed to get adequate pain control so that she can complete her treatment We discussed narcotic refill policy and discussed side effects of treatment

## 2021-02-07 NOTE — Progress Notes (Signed)
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[facility]) I have reviewed her surgical report and pathology report We discussed the risk, benefits, side effects of pembrolizumab We have successfully tapered her off prednisone therapy We will get her scheduled and start treatment as soon as possible She is aware that treatment duration is for 1 year  I reviewed the general approach to melanoma with the patient Given the depth of her melanoma and presence of ulceration and other adverse features including lymphovascular invasion, I would highly recommend the patient to consider adjuvant treatment with immunotherapy The patient would benefit from genetic counseling in the future given strong family history of melanoma The patient is instructed for aggressive skin protection in the meantime I will refer her to see dermatologist for formal evaluation in the future  PMR (polymyalgia rheumatica) ([place]) Since discontinuation of prednisone, she started to have slightly worsening joint pain She has not been taking oxycodone consistently  We have extensive discussions about chronic pain management We discussed the importance of her taking oxycodone as needed along with acetaminophen as needed to get adequate pain control so that she can complete her treatment We discussed narcotic refill policy and discussed side effects of treatment   Orders Placed This Encounter  Procedures  . Ambulatory referral to Genetics    Referral Priority:   Routine    Referral Type:   Consultation    Referral Reason:   Specialty Services Required    Number of Visits Requested:   1    All questions were answered. The patient knows to call the clinic with any problems, questions or concerns. The total time spent in the appointment was 40 minutes encounter with patients including review of chart and various  tests results, discussions about plan of care and coordination of care plan   [name], MD 02/07/2021 2:01 PM  INTERVAL HISTORY: Please see below for problem oriented charting. She returns for further follow-up She is doing well postoperatively and her wound appears to be healing well Starting this week, she started to notice flare of her polymyalgia rheumatica/arthritis pain She has completed prednisone taper by April 25 She has trouble sleeping at night this past few nights due to severe joint pain She felt that movement will help with her pain She has concern about her chronic pain management  SUMMARY OF ONCOLOGIC HISTORY: Oncology History Overview Note  BRAF: undetectable (negative)   Melanoma of vulva ([place])  10/26/2020 Initial Diagnosis   She was noted to have bloody discharge in her underwear.  She denies pain   12/07/2020 Initial Biopsy   FINAL MICROSCOPIC DIAGNOSIS:   A.   RIGHT VULVA, BIOPSY: PRIMARY MALIGNANT MELANOMA OF VULVA, MELANOMA  TABLE BELOW  MALIGNANT MELANOMA (AJCC 8TH EDITION):  PROCEDURE: BIOPSY  SPECIMEN ANATOMIC SITE: RIGHT VULVA  BRESLOW'S DEPTH/MAXIMUM TUMOR THICKNESS: 5.05 MM  CLARK/ANATOMIC LEVEL* IV  MARGINS:  PERIPHERAL: INVOLVED  DEEP: FREE  ULCERATION: PRESENT  MITOTIC INDEX: 11/mm2  LYMPHO-VASCULAR INVASION: PRESENT*  NEUTROTROPISM: ABSENT  TUMOR-INFILTRATING LYMPHOCYTES: NON-BRISK  LYMPH NODES: N/A  PATHOLOGIC STAGE: PT4B NX MX  *VULVAR MELANOMAS MAY NOT HAVE THE SAME BEHAVIOR STAGE FOR STAGE AS SUPERFICIAL SPREADING CUTANEOUS MELANOMA, AND LANDMARKS SUCH AS BRESLOW'S DEPTH MAY NOT APPLY. NONETHELESS, THIS SHOULD BE TREATED AS A PT4B MELANOMA AND COMPLETE EXCISION, WITH POSSIBLE LYMPH NODE EVALUATION, UNDERTAKEN. SEE COMMENT FOR IHC PROFILE.  COMMENT:  Sections show confluent and nested growth of atypical melanocytes along vulvar epithelium in an in-situ melanoma growth pattern at the periphery, with a central large ulcerated nodule of  similar atypical  melanocytes, consonant with primary vulvar melanoma. Notably, IHC was performed to evaluate the lineage and is diagnostic for melanoma. Lesional melanocytes are positive with S100, MelanA, and show a high ki-67 uptake, and are negative with CDX-2, synaptophysin, CK20, TTF-1, CK7, CD56, and lymphoid markers CD5, CD3, CD20 highlight only background lymphocytes.    12/17/2020 Initial Diagnosis   Melanoma of vulva ([place])   12/17/2020 Cancer Staging   Staging form: Melanoma of the Skin, AJCC 8th Edition - Clinical stage from 12/17/2020: Stage IIC (cT4b, cN0, cM0) - Signed by [name], MD on 12/17/2020 Stage prefix: Initial diagnosis   12/30/2020 PET scan   1. The only potential hypermetabolic finding of note is a small flat focus of accentuated metabolic activity along the anterior vaginal vestibule/perineum, maximum SUV 6.5. Most common cause for this type of appearance would be a trace amount of incontinence of urinary FDG, but given the proximity to the reported vulvar melanoma, this small focus is considered indeterminate. 2. Other imaging findings of potential clinical significance: Aortic Atherosclerosis (ICD10-I70.0). Small type 1 hiatal hernia. Palatine tonsilloliths. Colonic diverticulosis.   01/18/2021 Surgery   Preoperative Diagnosis: Vulvar melanoma  Postoperative Diagnosis: Same  Procedures performed: Wide radical vulvectomy, R inguinofemoral sentinel LN biopsy  Attending Surgeon: [name], MD  Fellow: [name]  Resident: Melony Overly, MD; Darrin Nipper, MD  Anesthesia: General endotracheal  Findings:  1. Vulva with biopsy site changes at R labia minora adjacent to the clitoris. No visible tumor. 2. Inguinofemoral nodes not clinically enlarged or abnormal.   Specimens:  1. R inguinal sentinel LN, hot and blue 2. Portion of vulva 3. Superior deep margin    01/18/2021 Pathology Results   Only melanoma in situ seen in final resection,  SLN negative   02/14/2021 -  Chemotherapy    Patient is on Treatment Plan: MELANOMA PEMBROLIZUMAB Q21D        REVIEW OF SYSTEMS:   Constitutional: Denies fevers, chills or abnormal weight loss Eyes: Denies blurriness of vision Ears, nose, mouth, throat, and face: Denies mucositis or sore throat Respiratory: Denies cough, dyspnea or wheezes Cardiovascular: Denies palpitation, chest discomfort or lower extremity swelling Gastrointestinal:  Denies nausea, heartburn or change in bowel habits Skin: Denies abnormal skin rashes Lymphatics: Denies new lymphadenopathy or easy bruising Neurological:Denies numbness, tingling or new weaknesses Behavioral/Psych: Mood is stable, no new changes  All other systems were reviewed with the patient and are negative.  I have reviewed the past medical history, past surgical history, social history and family history with the patient and they are unchanged from previous note.  ALLERGIES:  has No Known Allergies.  MEDICATIONS:  Current Outpatient Medications  Medication Sig Dispense Refill  . acetaminophen (ACETAMINOPHEN EXTRA STRENGTH) 500 MG tablet Take 2 tablets by mouth every 6 hours as needed for pain 100 tablet 2  . hydrochlorothiazide (HYDRODIURIL) 25 MG tablet Take 1 tablet (25 mg total) by mouth daily as needed. 90 tablet 3  . ibuprofen (ADVIL) 600 MG tablet Take 1 tablet by mouth every 6 hours as needed for pain 60 tablet 0  . ondansetron (ZOFRAN) 8 MG tablet Take 1 tablet (8 mg total) by mouth 2 (two) times daily as needed (Nausea or vomiting). 30 tablet 1  . oxyCODONE (OXY IR/ROXICODONE) 5 MG immediate release tablet TAKE  1 TABLET (5 MG TOTAL) BY MOUTH EVERY 6 (SIX) HOURS AS NEEDED FOR SEVERE PAIN. 30 tablet 0  . prochlorperazine (COMPAZINE) 10 MG tablet Take 1 tablet (10 mg total) by mouth every 6 (six) hours as needed (Nausea or vomiting). 30 tablet 1  . vitamin B-12 (CYANOCOBALAMIN) 50 MCG tablet Take 50 mcg by mouth daily.    [name] VITAMIN D,  CHOLECALCIFEROL, PO Take 2,000 Int'l Units/1.35m by mouth.     No current facility-administered medications for this visit.    PHYSICAL EXAMINATION: ECOG PERFORMANCE STATUS: 1 - Symptomatic but completely ambulatory  Vitals:   02/07/21 1247  BP: 131/76  Pulse: 90  Resp: 18  Temp: (!) 97.1 F (36.2 C)  SpO2: 97%   Filed Weights   02/07/21 1247  Weight: 189 lb 9.6 oz (86 kg)    GENERAL:alert, no distress and comfortable SKIN: skin color, texture, turgor are normal, no rashes or significant lesions EYES: normal, Conjunctiva are pink and non-injected, sclera clear OROPHARYNX:no exudate, no erythema and lips, buccal mucosa, and tongue normal  NECK: supple, thyroid normal size, non-tender, without nodularity LYMPH:  no palpable lymphadenopathy in the cervical, axillary or inguinal LUNGS: clear to auscultation and percussion with normal breathing effort HEART: regular rate & rhythm and no murmurs and no lower extremity edema ABDOMEN:abdomen soft, non-tender and normal bowel sounds Musculoskeletal:no cyanosis of digits and no clubbing  NEURO: alert & oriented x 3 with fluent speech, no focal motor/sensory deficits  LABORATORY DATA:  I have reviewed the data as listed    Component Value Date/Time   NA 139 02/07/2021 1233   K 3.4 (L) 02/07/2021 1233   CL 102 02/07/2021 1233   CO2 28 02/07/2021 1233   GLUCOSE 97 02/07/2021 1233   BUN 14 02/07/2021 1233   CREATININE 0.66 02/07/2021 1233   CREATININE 0.82 08/23/2020 1048   CALCIUM 9.6 02/07/2021 1233   PROT 7.2 02/07/2021 1233   ALBUMIN 4.0 02/07/2021 1233   AST 21 02/07/2021 1233   ALT 22 02/07/2021 1233   ALKPHOS 63 02/07/2021 1233   BILITOT 0.8 02/07/2021 1233   GFRNONAA >60 02/07/2021 1233   GFRNONAA 77 08/23/2020 1048   GFRAA 89 08/23/2020 1048    No results found for: SPEP, UPEP  Lab Results  Component Value Date   WBC 6.1 02/07/2021   NEUTROABS 4.0 02/07/2021   HGB 12.8 02/07/2021   HCT 38.3 02/07/2021    MCV 87.8 02/07/2021   PLT 310 02/07/2021      Chemistry      Component Value Date/Time   NA 139 02/07/2021 1233   K 3.4 (L) 02/07/2021 1233   CL 102 02/07/2021 1233   CO2 28 02/07/2021 1233   BUN 14 02/07/2021 1233   CREATININE 0.66 02/07/2021 1233   CREATININE 0.82 08/23/2020 1048      Component Value Date/Time   CALCIUM 9.6 02/07/2021 1233   ALKPHOS 63 02/07/2021 1233   AST 21 02/07/2021 1233   ALT 22 02/07/2021 1233   BILITOT 0.8 02/07/2021 1233

## 2021-02-08 ENCOUNTER — Telehealth: Payer: Self-pay | Admitting: Hematology and Oncology

## 2021-02-08 ENCOUNTER — Other Ambulatory Visit: Payer: Self-pay | Admitting: Hematology and Oncology

## 2021-02-08 ENCOUNTER — Inpatient Hospital Stay: Payer: No Typology Code available for payment source

## 2021-02-08 VITALS — BP 140/78 | HR 86 | Temp 98.3°F | Resp 16

## 2021-02-08 DIAGNOSIS — C519 Malignant neoplasm of vulva, unspecified: Secondary | ICD-10-CM

## 2021-02-08 DIAGNOSIS — Z5112 Encounter for antineoplastic immunotherapy: Secondary | ICD-10-CM | POA: Diagnosis not present

## 2021-02-08 LAB — T4: T4, Total: 8.5 ug/dL (ref 4.5–12.0)

## 2021-02-08 MED ORDER — PEMBROLIZUMAB CHEMO INJECTION 100 MG/4ML
200.0000 mg | Freq: Once | INTRAVENOUS | Status: AC
  Administered 2021-02-08: 200 mg via INTRAVENOUS
  Filled 2021-02-08: qty 8

## 2021-02-08 MED ORDER — SODIUM CHLORIDE 0.9 % IV SOLN
Freq: Once | INTRAVENOUS | Status: AC
  Filled 2021-02-08: qty 250

## 2021-02-08 NOTE — Patient Instructions (Addendum)
[facility]    Discharge Instructions: Thank you for choosing [facility] to provide your oncology and hematology care.   If you have a lab appointment with the [facility], please go directly to the [facility] and check in at the registration area.   Wear comfortable clothing and clothing appropriate for easy access to any Portacath or PICC line.   We strive to give you quality time with your provider. You may need to reschedule your appointment if you arrive late (15 or more minutes).  Arriving late affects you and other patients whose appointments are after yours.  Also, if you miss three or more appointments without notifying the office, you may be dismissed from the clinic at the provider's discretion.      For prescription refill requests, have your pharmacy contact our office and allow 72 hours for refills to be completed.    Today you received the following chemotherapy and/or immunotherapy agents: pembrolizumab.      To help prevent nausea and vomiting after your treatment, we encourage you to take your nausea medication as directed.  BELOW ARE SYMPTOMS THAT SHOULD BE REPORTED IMMEDIATELY: . *FEVER GREATER THAN 100.4 F (38 C) OR HIGHER . *CHILLS OR SWEATING . *NAUSEA AND VOMITING THAT IS NOT CONTROLLED WITH YOUR NAUSEA MEDICATION . *UNUSUAL SHORTNESS OF BREATH . *UNUSUAL BRUISING OR BLEEDING . *URINARY PROBLEMS (pain or burning when urinating, or frequent urination) . *BOWEL PROBLEMS (unusual diarrhea, constipation, pain near the anus) . TENDERNESS IN MOUTH AND THROAT WITH OR WITHOUT PRESENCE OF ULCERS (sore throat, sores in mouth, or a toothache) . UNUSUAL RASH, SWELLING OR PAIN  . UNUSUAL VAGINAL DISCHARGE OR ITCHING   Items with * indicate a potential emergency and should be followed up as soon as possible or go to the Emergency Department if any problems should occur.  Please show the CHEMOTHERAPY ALERT CARD or  IMMUNOTHERAPY ALERT CARD at check-in to the Emergency Department and triage nurse.  Should you have questions after your visit or need to cancel or reschedule your appointment, please contact [facility]  Dept: [phone]  and follow the prompts.  Office hours are 8:00 a.m. to 4:30 p.m. Monday - Friday. Please note that voicemails left after 4:00 p.m. may not be returned until the following business day.  We are closed weekends and major holidays. You have access to a nurse at all times for urgent questions. Please call the main number to the clinic Dept: [phone] and follow the prompts.   For any non-urgent questions, you may also contact your provider using MyChart. We now offer e-Visits for anyone 78 and older to request care online for non-urgent symptoms. For details visit [email or web address].   Also download the MyChart app! Go to the app store, search "MyChart", open the app, select [facility], and log in with your MyChart username and password.  Due to Covid, a mask is required upon entering the hospital/clinic. If you do not have a mask, one will be given to you upon arrival. For doctor visits, patients may have 1 support person aged 2 or older with them. For treatment visits, patients cannot have anyone with them due to current Covid guidelines and our immunocompromised population.   Pembrolizumab injection What is this medicine? PEMBROLIZUMAB (pem broe liz ue mab) is a monoclonal antibody. It is used to treat certain types of cancer. This medicine may be used for other purposes; ask your health care  provider or pharmacist if you have questions. COMMON BRAND NAME(S): Keytruda What should I tell my health care provider before I take this medicine? They need to know if you have any of these conditions:  autoimmune diseases like Crohn's disease, ulcerative colitis, or lupus  have had or planning to have an allogeneic stem cell transplant (uses  someone else's stem cells)  history of organ transplant  history of chest radiation  nervous system problems like myasthenia gravis or Guillain-Barre syndrome  an unusual or allergic reaction to pembrolizumab, other medicines, foods, dyes, or preservatives  pregnant or trying to get pregnant  breast-feeding How should I use this medicine? This medicine is for infusion into a vein. It is given by a health care professional in a hospital or clinic setting. A special MedGuide will be given to you before each treatment. Be sure to read this information carefully each time. Talk to your pediatrician regarding the use of this medicine in children. While this drug may be prescribed for children as young as 6 months for selected conditions, precautions do apply. Overdosage: If you think you have taken too much of this medicine contact a poison control center or emergency room at once. NOTE: This medicine is only for you. Do not share this medicine with others. What if I miss a dose? It is important not to miss your dose. Call your doctor or health care professional if you are unable to keep an appointment. What may interact with this medicine? Interactions have not been studied. This list may not describe all possible interactions. Give your health care provider a list of all the medicines, herbs, non-prescription drugs, or dietary supplements you use. Also tell them if you smoke, drink alcohol, or use illegal drugs. Some items may interact with your medicine. What should I watch for while using this medicine? Your condition will be monitored carefully while you are receiving this medicine. You may need blood work done while you are taking this medicine. Do not become pregnant while taking this medicine or for 4 months after stopping it. Women should inform their doctor if they wish to become pregnant or think they might be pregnant. There is a potential for serious side effects to an unborn  child. Talk to your health care professional or pharmacist for more information. Do not breast-feed an infant while taking this medicine or for 4 months after the last dose. What side effects may I notice from receiving this medicine? Side effects that you should report to your doctor or health care professional as soon as possible:  allergic reactions like skin rash, itching or hives, swelling of the face, lips, or tongue  bloody or black, tarry  breathing problems  changes in vision  chest pain  chills  confusion  constipation  cough  diarrhea  dizziness or feeling faint or lightheaded  fast or irregular heartbeat  fever  flushing  joint pain  low blood counts - this medicine may decrease the number of white blood cells, red blood cells and platelets. You may be at increased risk for infections and bleeding.  muscle pain  muscle weakness  pain, tingling, numbness in the hands or feet  persistent headache  redness, blistering, peeling or loosening of the skin, including inside the mouth  signs and symptoms of high blood sugar such as dizziness; dry mouth; dry skin; fruity breath; nausea; stomach pain; increased hunger or thirst; increased urination  signs and symptoms of kidney injury like trouble passing urine or change  in the amount of urine  signs and symptoms of liver injury like dark urine, light-colored stools, loss of appetite, nausea, right upper belly pain, yellowing of the eyes or skin  sweating  swollen lymph nodes  weight loss Side effects that usually do not require medical attention (report to your doctor or health care professional if they continue or are bothersome):  decreased appetite  hair loss  tiredness This list may not describe all possible side effects. Call your doctor for medical advice about side effects. You may report side effects to FDA at 1-800-FDA-1088. Where should I keep my medicine? This drug is given in a hospital  or clinic and will not be stored at home. NOTE: This sheet is a summary. It may not cover all possible information. If you have questions about this medicine, talk to your doctor, pharmacist, or health care provider.  2021 Elsevier/Gold Standard (2019-08-13 21:44:53)  Information from chemocare.com:  Immunotherapy Biologic Response Modifiers (BRM), also called immunotherapy, is a type of treatment that mobilizes the body's immune system to fight cancer.   The therapy mainly consists of stimulating the immune system to help it do its job more effectively.  Tumor Vaccines also work to stimulate the body's immune system. To help understand the role that biological agents play in cancer treatment some understanding of how the immune system (such as lymphocytes, dendritic cells and macrophages) works is helpful.  Chemotherapy Biological response modifiers are substances that are able to trigger the immune system to indirectly affect tumors.  These include cytokines such as interferons and interleukins.  This strategy involves giving larger amounts of these substances by injection or infusion in the hope of stimulating the cells of the immune system to act more effectively.  Chemotherapy, or "chemical treatment," has been around since the days of the ancient Greeks. However, chemotherapy for the treatment of cancer began in the 1940s with the use of nitrogen mustard. Since then, in the attempt to discover what is effective in chemotherapy, many new drugs have been developed and tried.  Sometimes referred to simply as "chemo", chemotherapy is used most often to describe drugs that kill cancer cells directly. These are sometimes referred to as "anti-cancer" drugs or "antineoplastics." Other chemo drugs such as biologic response modifiers, hormone therapy, and monoclonal antibodies, which work in different ways to treat cancer, are included in this web-site. Today's therapy uses more than 100 drugs to treat  cancer. There are even more chemo drugs still under development and investigation.

## 2021-02-08 NOTE — Telephone Encounter (Signed)
Scheduled appts per 5/16 sch msg. Pt aware.  

## 2021-02-09 ENCOUNTER — Telehealth: Payer: Self-pay | Admitting: *Deleted

## 2021-02-09 NOTE — Telephone Encounter (Signed)
-----   Message from Sinda Du, RN sent at 02/08/2021  8:35 AM EDT ----- Regarding: Dr. Alvy Bimler - 1st tx f/u 1st tx f/u

## 2021-02-09 NOTE — Telephone Encounter (Signed)
Called pt to see how she did with her treatment yest.  She reports that her polymyalgia is acting up & she had some nausea yest eve.  She took her nausea med which helped & she did take her oxycodone.  She 's not sure if the nausea was from the pain or from her treatment. She still is not sleeping well but states that she works 3rd shift & when she goes back to work that should be better.  She reports the pain wakes her up.  She knows how to reach [place] with further concerns.

## 2021-02-11 ENCOUNTER — Other Ambulatory Visit: Payer: Self-pay | Admitting: Hematology and Oncology

## 2021-02-11 ENCOUNTER — Other Ambulatory Visit (HOSPITAL_COMMUNITY): Payer: Self-pay

## 2021-02-11 MED ORDER — OXYCODONE HCL 5 MG PO TABS
5.0000 mg | ORAL_TABLET | ORAL | 0 refills | Status: DC | PRN
  Filled 2021-02-11: qty 60, 10d supply, fill #0

## 2021-02-14 ENCOUNTER — Telehealth: Payer: Self-pay

## 2021-02-14 NOTE — Telephone Encounter (Signed)
-----   Message from [name], MD sent at 02/14/2021  9:19 AM EDT ----- [facility], thanks for the update Please document ----- Message ----- From: Rennis Harding, RN Sent: 02/14/2021   9:18 AM EDT To: [name], MD  Patient reports pain is still present, but feels like it is controlled.  She is writing down when she takes them and the combo.  She is utilizing oxycodone as needed along with acetaminophen.  She has had full nights sleep X 3 nights, which is the first time since April.    Denies "surgical pain, more just pain due to fibromyalgia."     ----- Message ----- From: [name], MD Sent: 02/14/2021   8:38 AM EDT To: Rennis Harding, RN  Can you call and check on her, see how is her pain? Also, she is seeing genetics with lab appt after We can draw labs then no need repeat labs on 6/6

## 2021-02-23 ENCOUNTER — Telehealth: Payer: Self-pay | Admitting: *Deleted

## 2021-02-23 ENCOUNTER — Inpatient Hospital Stay: Payer: No Typology Code available for payment source

## 2021-02-23 ENCOUNTER — Other Ambulatory Visit (HOSPITAL_COMMUNITY): Payer: Self-pay

## 2021-02-23 ENCOUNTER — Encounter: Payer: Self-pay | Admitting: Hematology and Oncology

## 2021-02-23 ENCOUNTER — Other Ambulatory Visit: Payer: Self-pay

## 2021-02-23 ENCOUNTER — Inpatient Hospital Stay: Payer: No Typology Code available for payment source | Attending: Gynecologic Oncology | Admitting: Genetic Counselor

## 2021-02-23 ENCOUNTER — Other Ambulatory Visit: Payer: Self-pay | Admitting: Hematology and Oncology

## 2021-02-23 DIAGNOSIS — R110 Nausea: Secondary | ICD-10-CM | POA: Diagnosis not present

## 2021-02-23 DIAGNOSIS — M7918 Myalgia, other site: Secondary | ICD-10-CM | POA: Diagnosis not present

## 2021-02-23 DIAGNOSIS — Z808 Family history of malignant neoplasm of other organs or systems: Secondary | ICD-10-CM

## 2021-02-23 DIAGNOSIS — M2560 Stiffness of unspecified joint, not elsewhere classified: Secondary | ICD-10-CM | POA: Insufficient documentation

## 2021-02-23 DIAGNOSIS — R600 Localized edema: Secondary | ICD-10-CM | POA: Insufficient documentation

## 2021-02-23 DIAGNOSIS — K5730 Diverticulosis of large intestine without perforation or abscess without bleeding: Secondary | ICD-10-CM | POA: Insufficient documentation

## 2021-02-23 DIAGNOSIS — Z79899 Other long term (current) drug therapy: Secondary | ICD-10-CM | POA: Diagnosis not present

## 2021-02-23 DIAGNOSIS — C519 Malignant neoplasm of vulva, unspecified: Secondary | ICD-10-CM | POA: Insufficient documentation

## 2021-02-23 DIAGNOSIS — Z5112 Encounter for antineoplastic immunotherapy: Secondary | ICD-10-CM | POA: Diagnosis present

## 2021-02-23 DIAGNOSIS — K449 Diaphragmatic hernia without obstruction or gangrene: Secondary | ICD-10-CM | POA: Diagnosis not present

## 2021-02-23 DIAGNOSIS — M353 Polymyalgia rheumatica: Secondary | ICD-10-CM | POA: Diagnosis not present

## 2021-02-23 LAB — CBC WITH DIFFERENTIAL (CANCER CENTER ONLY)
Abs Immature Granulocytes: 0.02 10*3/uL (ref 0.00–0.07)
Basophils Absolute: 0 10*3/uL (ref 0.0–0.1)
Basophils Relative: 1 %
Eosinophils Absolute: 0.1 10*3/uL (ref 0.0–0.5)
Eosinophils Relative: 1 %
HCT: 36.2 % (ref 36.0–46.0)
Hemoglobin: 11.8 g/dL — ABNORMAL LOW (ref 12.0–15.0)
Immature Granulocytes: 0 %
Lymphocytes Relative: 20 %
Lymphs Abs: 1.2 10*3/uL (ref 0.7–4.0)
MCH: 29.1 pg (ref 26.0–34.0)
MCHC: 32.6 g/dL (ref 30.0–36.0)
MCV: 89.4 fL (ref 80.0–100.0)
Monocytes Absolute: 0.5 10*3/uL (ref 0.1–1.0)
Monocytes Relative: 8 %
Neutro Abs: 4.3 10*3/uL (ref 1.7–7.7)
Neutrophils Relative %: 70 %
Platelet Count: 384 10*3/uL (ref 150–400)
RBC: 4.05 MIL/uL (ref 3.87–5.11)
RDW: 12.5 % (ref 11.5–15.5)
WBC Count: 6.1 10*3/uL (ref 4.0–10.5)
nRBC: 0 % (ref 0.0–0.2)

## 2021-02-23 LAB — CMP (CANCER CENTER ONLY)
ALT: 9 U/L (ref 0–44)
AST: 11 U/L — ABNORMAL LOW (ref 15–41)
Albumin: 3.5 g/dL (ref 3.5–5.0)
Alkaline Phosphatase: 67 U/L (ref 38–126)
Anion gap: 12 (ref 5–15)
BUN: 15 mg/dL (ref 8–23)
CO2: 27 mmol/L (ref 22–32)
Calcium: 9.5 mg/dL (ref 8.9–10.3)
Chloride: 98 mmol/L (ref 98–111)
Creatinine: 0.59 mg/dL (ref 0.44–1.00)
GFR, Estimated: >60 mL/min (ref >60)
Glucose, Bld: 94 mg/dL (ref 70–99)
Potassium: 3.9 mmol/L (ref 3.5–5.1)
Sodium: 137 mmol/L (ref 135–145)
Total Bilirubin: 0.6 mg/dL (ref 0.3–1.2)
Total Protein: 7.2 g/dL (ref 6.5–8.1)

## 2021-02-23 MED ORDER — OXYCODONE HCL 5 MG PO TABS
5.0000 mg | ORAL_TABLET | ORAL | 0 refills | Status: DC | PRN
  Filled 2021-02-23: qty 90, 15d supply, fill #0

## 2021-02-23 NOTE — Telephone Encounter (Signed)
Received copy of Oxycodone IR 5 mg prescription order in prior auth folder at 5:00 pm.  Attempted to obtain Oxy IR authorization through CoverMyMeds, Key: [place].   Response per [employer] reads "A PA is already in process for this member/drug.  For further information, contact the number on the back of the member prescription card." No further PA activity per this nurse.

## 2021-02-24 ENCOUNTER — Other Ambulatory Visit (HOSPITAL_COMMUNITY): Payer: Self-pay

## 2021-02-24 ENCOUNTER — Encounter: Payer: Self-pay | Admitting: Genetic Counselor

## 2021-02-24 ENCOUNTER — Telehealth: Payer: Self-pay

## 2021-02-24 ENCOUNTER — Encounter: Payer: Self-pay | Admitting: Hematology and Oncology

## 2021-02-24 DIAGNOSIS — Z808 Family history of malignant neoplasm of other organs or systems: Secondary | ICD-10-CM | POA: Insufficient documentation

## 2021-02-24 LAB — TSH: TSH: 1.679 u[IU]/mL (ref 0.308–3.960)

## 2021-02-24 LAB — T4: T4, Total: 7.5 ug/dL (ref 4.5–12.0)

## 2021-02-24 NOTE — Telephone Encounter (Signed)
   No Oxy IR PA initiated per collaborative nurse.  Clydene Laming with [employer] for status check.  "Oxy IR request currently pending.  Allow three business-day turnaround for decision.  [facility] initiated request and needs further information.  Transferring to prior authorization representative to complete request via phone."  Provided diagnosis codes answering questions asked.  Prior authorization approved for 12 months.    [facility] and collaborative notified.

## 2021-02-24 NOTE — Progress Notes (Signed)
REFERRING PROVIDER: [name], MD [place],  [place] [address]  PRIMARY PROVIDER:  [name], [name], MD  PRIMARY REASON FOR VISIT:  1. 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 Family history of melanoma      HISTORY OF PRESENT ILLNESS:   Ariel Rios, a 65 y.o. female, was seen for a [place] cancer genetics consultation at the request of Dr. Alvy Bimler due to a personal and family history of melanoma.  Ariel Rios presents to clinic today to discuss the possibility of a hereditary predisposition to cancer, genetic testing, and to further clarify her future cancer risks, as well as potential cancer risks for family members.   In February 2022, at the age of 58, Ariel Rios was diagnosed with melanoma of the vulva. The treatment plan included surgery and chemotherapy.    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  She denies pain   12/07/2020 Initial Biopsy   FINAL MICROSCOPIC DIAGNOSIS:   A.   RIGHT VULVA, BIOPSY: PRIMARY MALIGNANT MELANOMA OF VULVA, MELANOMA  TABLE BELOW  MALIGNANT MELANOMA (AJCC 8TH EDITION):  PROCEDURE: BIOPSY  SPECIMEN ANATOMIC SITE: RIGHT VULVA  BRESLOW'S DEPTH/MAXIMUM TUMOR THICKNESS: 5.05 MM  CLARK/ANATOMIC LEVEL* IV  MARGINS:  PERIPHERAL: INVOLVED  DEEP: FREE  ULCERATION: PRESENT  MITOTIC INDEX: 11/mm2  LYMPHO-VASCULAR INVASION: PRESENT*  NEUTROTROPISM: ABSENT  TUMOR-INFILTRATING LYMPHOCYTES: NON-BRISK  LYMPH NODES: N/A  PATHOLOGIC STAGE: PT4B NX MX  *VULVAR MELANOMAS MAY NOT HAVE THE SAME BEHAVIOR STAGE FOR STAGE AS SUPERFICIAL SPREADING CUTANEOUS MELANOMA, AND LANDMARKS SUCH AS BRESLOW'S DEPTH MAY NOT APPLY. NONETHELESS, THIS SHOULD BE TREATED AS A PT4B MELANOMA AND COMPLETE EXCISION, WITH POSSIBLE LYMPH NODE EVALUATION, UNDERTAKEN. SEE COMMENT FOR IHC PROFILE.    COMMENT:   Sections show confluent and nested growth of atypical melanocytes along vulvar epithelium in an in-situ melanoma growth pattern at the periphery, with a central large ulcerated nodule of similar atypical  melanocytes, consonant with primary vulvar melanoma. Notably, IHC was performed to evaluate the lineage and is diagnostic for melanoma. Lesional melanocytes are positive with S100, MelanA, and show a high ki-67 uptake, and are negative with CDX-2, synaptophysin, CK20, TTF-1, CK7, CD56, and lymphoid markers CD5, CD3, CD20 highlight only background lymphocytes.    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 The only potential hypermetabolic finding of note is a small flat focus of accentuated metabolic activity along the anterior vaginal vestibule/perineum, maximum SUV 6.5. Most common cause for this type of appearance would be a trace amount of incontinence of urinary FDG, but given the proximity to the reported vulvar melanoma, this small focus is considered indeterminate. 2. Other imaging findings of potential clinical significance: Aortic Atherosclerosis (ICD10-I70.0). Small type 1 hiatal hernia. Palatine tonsilloliths. Colonic diverticulosis.   01/18/2021 Surgery   Preoperative Diagnosis: Vulvar melanoma  Postoperative Diagnosis: Same  Procedures performed: Wide radical vulvectomy, R inguinofemoral sentinel LN biopsy  Attending Surgeon: [name], MD  Fellow: [name]  Resident: Melony Overly, MD; Darrin Nipper, MD  Anesthesia: General endotracheal  Findings:  1. Vulva with biopsy site  changes at R labia minora adjacent to the clitoris. No visible tumor. 2. Inguinofemoral nodes not clinically enlarged or  abnormal.   Specimens:  1. R inguinal sentinel LN, hot and blue 2. Portion of vulva 3. Superior deep margin    01/18/2021 Pathology Results   Only melanoma in situ seen in final resection, SLN negative   02/08/2021 -  Chemotherapy    Patient is on Treatment Plan: MELANOMA PEMBROLIZUMAB Q21D         RISK FACTORS:  Menarche was at age 24.  First live birth at age 64.  OCP use for approximately <10 years years.  Ovaries intact: yes.  Hysterectomy: no.  Menopausal status: postmenopausal.  HRT use: 0 years. Colonoscopy: yes; normal. Mammogram within the last year: yes. Number of breast biopsies: 0. Up to date with pelvic exams: yes. Any excessive radiation exposure in the past: no  Past Medical History:  Diagnosis Date  . BMI 34.0-34.9,adult   . Family history of melanoma   . Melanotic macule of vulvar labia   . Migraine   . Polymyalgia rheumatica ([place])   . Venous insufficiency     No past surgical history on file.  Social History   Socioeconomic History  . Marital status: Married    Spouse name: Not on file  . Number of children: 2  . Years of education: Not on file  . Highest education level: Not on file  Occupational History  . Occupation: Economist  . Occupation: stocking  Tobacco Use  . Smoking status: Never Smoker  . Smokeless tobacco: Never Used  Vaping Use  . Vaping Use: Never used  Substance and Sexual Activity  . Alcohol use: Yes    Comment: occasional  . Drug use: No  . Sexual activity: Not Currently  Other Topics Concern  . Not on file  Social History Narrative  . Not on file   Social Determinants of Health   Financial Resource Strain: Not on file  Food Insecurity: Not on file  Transportation Needs: Not on file  Physical Activity: Not on file  Stress: Not on file  Social Connections: Not on file     FAMILY HISTORY:  We obtained a detailed, 4-generation family history.  Significant diagnoses are listed below: Family History   Problem Relation Age of Onset  . Diabetes Mother   . AVM Father   . Melanoma Father        multiple melanomas dx between 46-90  . Cancer Father 20       tumor on kidney  . Cancer Paternal Grandfather        laryngeal cancer  . Cancer Daughter 65       appendiceal cancer  . Breast cancer Neg Hx   . Ovarian cancer Neg Hx   . Colon cancer Neg Hx   . Uterine cancer Neg Hx     The patient has a son and daughter, her daughter was found to have appendiceal cancer at age 44.  She has three brothers who are cancer free.  Her mother is living and her father is deceased.  The patient's father reportedly had multiple melanomas removed starting at age 34 through his 26's.In his 85's, he was found to have a tumor on his kidneys. He had four brothers and a sister who were all cancer free.  Both of his parents are deceased.  His father had laryngeal cancer.    The patient's mother did not have cancer.  She had two brothers  and three sisters, none who reportedly had cancer.  Both of her parents are deceased from non-cancer related issues.  Ariel Rios is unaware of previous family history of genetic testing for hereditary cancer risks. Patient's maternal ancestors are of [place] descent, and paternal ancestors are of [place] descent. There is no reported Ashkenazi Jewish ancestry. There is no known consanguinity.  GENETIC COUNSELING ASSESSMENT: Ariel Rios is a 65 y.o. female with a personal and family history of melanoma which is somewhat suggestive of a hereditary cancer syndrome and predisposition to cancer given the multiple melanomas in the family and the combination of melanoma and kidney cancer. We, therefore, discussed and recommended the following at today's visit.   DISCUSSION: We discussed that 5 - 10% of melanoma is hereditary, with most cases associated with CDKN2A.  This gene is associated with melanoma and pancreatic cancer.  The patient does not have a personal or family history of  pancreatic cancer at this time.  There are other genes that can be associated with hereditary melanoma syndromes.  These include MITF, POT1 and others.  The greatest concern with the personal and family history of melanoma as well as the family history of kidney cancer is MITF.  We discussed that testing is beneficial for several reasons including knowing how to follow individuals after completing their treatment, identifying whether potential treatment options such as PARP inhibitors would be beneficial, and understand if other family members could be at risk for cancer and allow them to undergo genetic testing.   We reviewed the characteristics, features and inheritance patterns of hereditary cancer syndromes. We also discussed genetic testing, including the appropriate family members to test, the process of testing, insurance coverage and turn-around-time for results. We discussed the implications of a negative, positive, carrier and/or variant of uncertain significant result. We recommended Ariel Rios pursue genetic testing for the Multi-cancer +RNA gene panel. The Multi-Gene Panel offered by [facility] includes sequencing and/or deletion duplication testing of the following 84 genes: AIP, ALK, APC, ATM, AXIN2,BAP1,  BARD1, BLM, BMPR1A, BRCA1, BRCA2, BRIP1, CASR, CDC73, CDH1, CDK4, CDKN1B, CDKN1C, CDKN2A (p14ARF), CDKN2A (p16INK4a), CEBPA, CHEK2, CTNNA1, DICER1, DIS3L2, EGFR (c.2369C>T, p.Thr790Met variant only), EPCAM (Deletion/duplication testing only), FH, FLCN, GATA2, GPC3, GREM1 (Promoter region deletion/duplication testing only), HOXB13 (c.251G>A, p.Gly84Glu), HRAS, KIT, MAX, MEN1, MET, MITF (c.952G>A, p.Glu318Lys variant only), MLH1, MSH2, MSH3, MSH6, MUTYH, NBN, NF1, NF2, NTHL1, PALB2, PDGFRA, PHOX2B, PMS2, POLD1, POLE, POT1, PRKAR1A, PTCH1, PTEN, RAD50, RAD51C, RAD51D, RB1, RECQL4, RET, RUNX1, SDHAF2, SDHA (sequence changes only), SDHB, SDHC, SDHD, SMAD4, SMARCA4, SMARCB1, SMARCE1, STK11, SUFU, TERC,  TERT, TMEM127, TP53, TSC1, TSC2, VHL, WRN and WT1.     Based on Ariel Rios's personal and family history of cancer, she meets medical criteria for genetic testing. Despite that she meets criteria, she may still have an out of pocket cost. We discussed that if her out of pocket cost for testing is over $100, the laboratory will call and confirm whether she wants to proceed with testing.  If the out of pocket cost of testing is less than $100 she will be billed by the genetic testing laboratory.   PLAN: After considering the risks, benefits, and limitations, Ariel Rios provided informed consent to pursue genetic testing and the blood sample was sent to [facility] for analysis of the Multi-cancer panel + RNA. Results should be available within approximately 2-3 weeks' time, at which point they will be disclosed by telephone to Ariel Rios, as will any additional recommendations warranted by these results.  Ariel Rios will receive a summary of her genetic counseling visit and a copy of her results once available. This information will also be available in Epic.   Lastly, we encouraged Ariel Rios to remain in contact with cancer genetics annually so that we can continuously update the family history and inform her of any changes in cancer genetics and testing that may be of benefit for this family.   Ms. Picking questions were answered to her satisfaction today. Our contact information was provided should additional questions or concerns arise. Thank you for the referral and allowing [place] to share in the care of your patient.   Erhardt Dada P. Florene Glen, [place], [facility] Licensed, Insurance risk surveyor [email or web address] phone: [phone]  The patient was seen for a total of 45 minutes in face-to-face genetic counseling. The patient was seen alone. This patient was discussed with Drs. Magrinat, Lindi Adie and/or Burr Medico who agrees with the above.     _______________________________________________________________________ For Office Staff:  Number of people involved in session: 1 Was an Intern/ student involved with case: no

## 2021-02-24 NOTE — Telephone Encounter (Signed)
Called regarding mychart message. Prior authorization has been approved for oxycodone Rx and she can pick up at [facility] outpatient pharmacy. She verbalized understanding.

## 2021-02-28 ENCOUNTER — Inpatient Hospital Stay: Payer: No Typology Code available for payment source | Admitting: Hematology and Oncology

## 2021-02-28 ENCOUNTER — Other Ambulatory Visit: Payer: Self-pay

## 2021-02-28 ENCOUNTER — Encounter: Payer: Self-pay | Admitting: Hematology and Oncology

## 2021-02-28 ENCOUNTER — Other Ambulatory Visit: Payer: No Typology Code available for payment source

## 2021-02-28 ENCOUNTER — Inpatient Hospital Stay: Payer: No Typology Code available for payment source

## 2021-02-28 DIAGNOSIS — R110 Nausea: Secondary | ICD-10-CM

## 2021-02-28 DIAGNOSIS — C519 Malignant neoplasm of vulva, unspecified: Secondary | ICD-10-CM

## 2021-02-28 DIAGNOSIS — M353 Polymyalgia rheumatica: Secondary | ICD-10-CM | POA: Diagnosis not present

## 2021-02-28 DIAGNOSIS — Z5112 Encounter for antineoplastic immunotherapy: Secondary | ICD-10-CM | POA: Diagnosis not present

## 2021-02-28 MED ORDER — SODIUM CHLORIDE 0.9 % IV SOLN
200.0000 mg | Freq: Once | INTRAVENOUS | Status: AC
  Administered 2021-02-28: 200 mg via INTRAVENOUS
  Filled 2021-02-28: qty 8

## 2021-02-28 MED ORDER — SODIUM CHLORIDE 0.9 % IV SOLN
Freq: Once | INTRAVENOUS | Status: AC
Start: 2021-02-28 — End: 2021-02-28
  Filled 2021-02-28: qty 250

## 2021-02-28 NOTE — Patient Instructions (Signed)
[facility]  Discharge Instructions: ?Thank you for choosing [facility] to provide your oncology and hematology care.  ? ?If you have a lab appointment with the Cancer Center, please go directly to the Cancer Center and check in at the registration area. ?  ?Wear comfortable clothing and clothing appropriate for easy access to any Portacath or PICC line.  ? ?We strive to give you quality time with your provider. You may need to reschedule your appointment if you arrive late (15 or more minutes).  Arriving late affects you and other patients whose appointments are after yours.  Also, if you miss three or more appointments without notifying the office, you may be dismissed from the clinic at the provider?s discretion.    ?  ?For prescription refill requests, have your pharmacy contact our office and allow 72 hours for refills to be completed.   ? ?Today you received the following chemotherapy and/or immunotherapy agents: Keytruda ?  ?To help prevent nausea and vomiting after your treatment, we encourage you to take your nausea medication as directed. ? ?BELOW ARE SYMPTOMS THAT SHOULD BE REPORTED IMMEDIATELY: ?*FEVER GREATER THAN 100.4 F (38 ?C) OR HIGHER ?*CHILLS OR SWEATING ?*NAUSEA AND VOMITING THAT IS NOT CONTROLLED WITH YOUR NAUSEA MEDICATION ?*UNUSUAL SHORTNESS OF BREATH ?*UNUSUAL BRUISING OR BLEEDING ?*URINARY PROBLEMS (pain or burning when urinating, or frequent urination) ?*BOWEL PROBLEMS (unusual diarrhea, constipation, pain near the anus) ?TENDERNESS IN MOUTH AND THROAT WITH OR WITHOUT PRESENCE OF ULCERS (sore throat, sores in mouth, or a toothache) ?UNUSUAL RASH, SWELLING OR PAIN  ?UNUSUAL VAGINAL DISCHARGE OR ITCHING  ? ?Items with * indicate a potential emergency and should be followed up as soon as possible or go to the Emergency Department if any problems should occur. ? ?Please show the CHEMOTHERAPY ALERT CARD or IMMUNOTHERAPY ALERT CARD at check-in to the  Emergency Department and triage nurse. ? ?Should you have questions after your visit or need to cancel or reschedule your appointment, please contact [facility]  Dept: [phone]  and follow the prompts.  Office hours are 8:00 a.m. to 4:30 p.m. Monday - Friday. Please note that voicemails left after 4:00 p.m. may not be returned until the following business day.  We are closed weekends and major holidays. You have access to a nurse at all times for urgent questions. Please call the main number to the clinic Dept: [phone] and follow the prompts. ? ? ?For any non-urgent questions, you may also contact your provider using MyChart. We now offer e-Visits for anyone 18 and older to request care online for non-urgent symptoms. For details visit [email or web address]. ?  ?Also download the MyChart app! Go to the app store, search "MyChart", open the app, select [facility], and log in with your MyChart username and password. ? ?Due to Covid, a mask is required upon entering the hospital/clinic. If you do not have a mask, one will be given to you upon arrival. For doctor visits, patients may have 1 support person aged 18 or older with them. For treatment visits, patients cannot have anyone with them due to current Covid guidelines and our immunocompromised population.  ? ?

## 2021-02-28 NOTE — Progress Notes (Signed)
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[facility]) From treatment standpoint, she tolerated immunotherapy well without major side effects She did feel that her symptoms of polymyalgia rheumatica seems a little worse Overall, she felt confident she can tolerate the treatment for 1 year I plan to repeat PET CT scan after 3-4 treatment  PMR (polymyalgia rheumatica) (HCC) Her symptoms of polymyalgia rheumatica appears worse She has significant muscle stiffness and intermittent muscle pain that could be debilitating at times She takes 6-7 oxycodone per day for her symptoms She is wondering about an over-the-counter supplement I recommend trial of ibuprofen in addition to acetaminophen and oxycodone We discussed potential addition of long-acting pain medicine as needed I will call her at the end of the week to check on her symptom management  Nausea without vomiting She will continue antiemetics as needed   No orders of the defined types were placed in this encounter.   All questions were answered. The patient knows to call the clinic with any problems, questions or concerns. The total time spent in the appointment was 30 minutes encounter with patients including review of chart and various tests results, discussions about plan of care and coordination of care plan   [name], MD 02/28/2021 10:59 AM  INTERVAL HISTORY: Please see below for problem oriented charting. She returns to be seen prior to cycle 2 of treatment She has seen genetic counseling recently She denies side effects from treatment She had some mild nausea that night after treatment that resolved with Zofran Her major concern is related to flare of polymyalgia rheumatica From her description, it appears she have significant portion of joint stiffness limiting activities of daily living and pain She  takes on average 6-7 oxycodone per day to get her pain under control, along with acetaminophen She has not taken ibuprofen lately She is wondering about a joint supplement called relief factor When I look at the ingredient of that, it appears that he has some unknown supplement  SUMMARY OF ONCOLOGIC HISTORY: Oncology History Overview Note  BRAF: undetectable (negative)   Melanoma of vulva ([place])  10/26/2020 Initial Diagnosis   She was noted to have bloody discharge in her underwear.  She denies pain   12/07/2020 Initial Biopsy   FINAL MICROSCOPIC DIAGNOSIS:   A.   RIGHT VULVA, BIOPSY: PRIMARY MALIGNANT MELANOMA OF VULVA, MELANOMA  TABLE BELOW  MALIGNANT MELANOMA (AJCC 8TH EDITION):  PROCEDURE: BIOPSY  SPECIMEN ANATOMIC SITE: RIGHT VULVA  BRESLOW'S DEPTH/MAXIMUM TUMOR THICKNESS: 5.05 MM  CLARK/ANATOMIC LEVEL* IV  MARGINS:  PERIPHERAL: INVOLVED  DEEP: FREE  ULCERATION: PRESENT  MITOTIC INDEX: 11/mm2  LYMPHO-VASCULAR INVASION: PRESENT*  NEUTROTROPISM: ABSENT  TUMOR-INFILTRATING LYMPHOCYTES: NON-BRISK  LYMPH NODES: N/A  PATHOLOGIC STAGE: PT4B NX MX  *VULVAR MELANOMAS MAY NOT HAVE THE SAME BEHAVIOR STAGE FOR STAGE AS SUPERFICIAL SPREADING CUTANEOUS MELANOMA, AND LANDMARKS SUCH AS BRESLOW'S DEPTH MAY NOT APPLY. NONETHELESS, THIS SHOULD BE TREATED AS A PT4B MELANOMA AND COMPLETE EXCISION, WITH POSSIBLE LYMPH NODE EVALUATION, UNDERTAKEN. SEE COMMENT FOR IHC PROFILE.    COMMENT:  Sections show confluent and nested growth of atypical melanocytes along vulvar epithelium in an in-situ melanoma growth pattern at the periphery, with a central large ulcerated nodule of similar atypical  melanocytes, consonant with primary vulvar melanoma. Notably, IHC was performed to evaluate the lineage and is diagnostic for melanoma. Lesional melanocytes are positive with S100,  MelanA, and show a high ki-67 uptake, and are negative with CDX-2, synaptophysin, CK20, TTF-1, CK7, CD56, and lymphoid markers CD5,  CD3, CD20 highlight only background lymphocytes.    12/17/2020 Initial Diagnosis   Melanoma of vulva ([place])   12/17/2020 Cancer Staging   Staging form: Melanoma of the Skin, AJCC 8th Edition - Clinical stage from 12/17/2020: Stage IIC (cT4b, cN0, cM0) - Signed by [name], MD on 12/17/2020 Stage prefix: Initial diagnosis   12/30/2020 PET scan   1. The only potential hypermetabolic finding of note is a small flat focus of accentuated metabolic activity along the anterior vaginal vestibule/perineum, maximum SUV 6.5. Most common cause for this type of appearance would be a trace amount of incontinence of urinary FDG, but given the proximity to the reported vulvar melanoma, this small focus is considered indeterminate. 2. Other imaging findings of potential clinical significance: Aortic Atherosclerosis (ICD10-I70.0). Small type 1 hiatal hernia. Palatine tonsilloliths. Colonic diverticulosis.   01/18/2021 Surgery   Preoperative Diagnosis: Vulvar melanoma  Postoperative Diagnosis: Same  Procedures performed: Wide radical vulvectomy, R inguinofemoral sentinel LN biopsy  Attending Surgeon: [name], MD  Fellow: [name]  Resident: Melony Overly, MD; Darrin Nipper, MD  Anesthesia: General endotracheal  Findings:  1. Vulva with biopsy site changes at R labia minora adjacent to the clitoris. No visible tumor. 2. Inguinofemoral nodes not clinically enlarged or abnormal.   Specimens:  1. R inguinal sentinel LN, hot and blue 2. Portion of vulva 3. Superior deep margin    01/18/2021 Pathology Results   Only melanoma in situ seen in final resection, SLN negative   02/08/2021 -  Chemotherapy    Patient is on Treatment Plan: MELANOMA PEMBROLIZUMAB Q21D        REVIEW OF SYSTEMS:   Constitutional: Denies fevers, chills or abnormal weight loss Eyes: Denies blurriness of vision Ears, nose, mouth, throat, and face: Denies mucositis or sore throat Respiratory: Denies  cough, dyspnea or wheezes Cardiovascular: Denies palpitation, chest discomfort or lower extremity swelling Skin: Denies abnormal skin rashes Lymphatics: Denies new lymphadenopathy or easy bruising Neurological:Denies numbness, tingling or new weaknesses Behavioral/Psych: Mood is stable, no new changes  All other systems were reviewed with the patient and are negative.  I have reviewed the past medical history, past surgical history, social history and family history with the patient and they are unchanged from previous note.  ALLERGIES:  has No Known Allergies.  MEDICATIONS:  Current Outpatient Medications  Medication Sig Dispense Refill  . acetaminophen (ACETAMINOPHEN EXTRA STRENGTH) 500 MG tablet Take 2 tablets by mouth every 6 hours as needed for pain 100 tablet 2  . hydrochlorothiazide (HYDRODIURIL) 25 MG tablet Take 1 tablet (25 mg total) by mouth daily as needed. 90 tablet 3  . ibuprofen (ADVIL) 600 MG tablet Take 1 tablet by mouth every 6 hours as needed for pain 60 tablet 0  . ondansetron (ZOFRAN) 8 MG tablet Take 1 tablet (8 mg total) by mouth 2 times daily as needed for nausea or vomiting. 30 tablet 1  . oxyCODONE (OXY IR/ROXICODONE) 5 MG immediate release tablet Take 1 tablet (5 mg total) by mouth every 4 (four) hours as needed for pain 90 tablet 0  . prochlorperazine (COMPAZINE) 10 MG tablet Take 1 tablet (10 mg total) by mouth every 6 (six) hours as needed for nausea or vomiting. 30 tablet 1  . vitamin B-12 (CYANOCOBALAMIN) 50 MCG tablet Take 50 mcg by mouth daily.    [name] VITAMIN D, CHOLECALCIFEROL, PO  Take 2,000 Int'l Units/1.2m by mouth.     No current facility-administered medications for this visit.   Facility-Administered Medications Ordered in Other Visits  Medication Dose Route Frequency Provider Last Rate Last Admin  . pembrolizumab (KEYTRUDA) 200 mg in sodium chloride 0.9 % 50 mL chemo infusion  200 mg Intravenous Once GAlvy Bimler Ansleigh Safer, MD        PHYSICAL  EXAMINATION: ECOG PERFORMANCE STATUS: 1 - Symptomatic but completely ambulatory  Vitals:   02/28/21 0938  BP: 129/71  Pulse: 87  Resp: 18  Temp: (!) 97.5 F (36.4 C)  SpO2: 97%   Filed Weights   02/28/21 0938  Weight: 190 lb 9.6 oz (86.5 kg)    GENERAL:alert, no distress and comfortable SKIN: skin color, texture, turgor are normal, no rashes or significant lesions EYES: normal, Conjunctiva are pink and non-injected, sclera clear OROPHARYNX:no exudate, no erythema and lips, buccal mucosa, and tongue normal  NECK: supple, thyroid normal size, non-tender, without nodularity LYMPH:  no palpable lymphadenopathy in the cervical, axillary or inguinal LUNGS: clear to auscultation and percussion with normal breathing effort HEART: regular rate & rhythm and no murmurs and no lower extremity edema ABDOMEN:abdomen soft, non-tender and normal bowel sounds Musculoskeletal:no cyanosis of digits and no clubbing  NEURO: alert & oriented x 3 with fluent speech, no focal motor/sensory deficits  LABORATORY DATA:  I have reviewed the data as listed    Component Value Date/Time   NA 137 02/23/2021 1400   K 3.9 02/23/2021 1400   CL 98 02/23/2021 1400   CO2 27 02/23/2021 1400   GLUCOSE 94 02/23/2021 1400   BUN 15 02/23/2021 1400   CREATININE 0.59 02/23/2021 1400   CREATININE 0.82 08/23/2020 1048   CALCIUM 9.5 02/23/2021 1400   PROT 7.2 02/23/2021 1400   ALBUMIN 3.5 02/23/2021 1400   AST 11 (L) 02/23/2021 1400   ALT 9 02/23/2021 1400   ALKPHOS 67 02/23/2021 1400   BILITOT 0.6 02/23/2021 1400   GFRNONAA >60 02/23/2021 1400   GFRNONAA 77 08/23/2020 1048   GFRAA 89 08/23/2020 1048    No results found for: SPEP, UPEP  Lab Results  Component Value Date   WBC 6.1 02/23/2021   NEUTROABS 4.3 02/23/2021   HGB 11.8 (L) 02/23/2021   HCT 36.2 02/23/2021   MCV 89.4 02/23/2021   PLT 384 02/23/2021      Chemistry      Component Value Date/Time   NA 137 02/23/2021 1400   K 3.9 02/23/2021  1400   CL 98 02/23/2021 1400   CO2 27 02/23/2021 1400   BUN 15 02/23/2021 1400   CREATININE 0.59 02/23/2021 1400   CREATININE 0.82 08/23/2020 1048      Component Value Date/Time   CALCIUM 9.5 02/23/2021 1400   ALKPHOS 67 02/23/2021 1400   AST 11 (L) 02/23/2021 1400   ALT 9 02/23/2021 1400   BILITOT 0.6 02/23/2021 1400

## 2021-02-28 NOTE — Assessment & Plan Note (Signed)
From treatment standpoint, she tolerated immunotherapy well without major side effects She did feel that her symptoms of polymyalgia rheumatica seems a little worse Overall, she felt confident she can tolerate the treatment for 1 year I plan to repeat PET CT scan after 3-4 treatment

## 2021-02-28 NOTE — Assessment & Plan Note (Signed)
She will continue antiemetics as needed

## 2021-02-28 NOTE — Assessment & Plan Note (Signed)
Her symptoms of polymyalgia rheumatica appears worse She has significant muscle stiffness and intermittent muscle pain that could be debilitating at times She takes 6-7 oxycodone per day for her symptoms She is wondering about an over-the-counter supplement I recommend trial of ibuprofen in addition to acetaminophen and oxycodone We discussed potential addition of long-acting pain medicine as needed I will call her at the end of the week to check on her symptom management

## 2021-03-02 ENCOUNTER — Other Ambulatory Visit (HOSPITAL_COMMUNITY): Payer: Self-pay

## 2021-03-02 NOTE — Telephone Encounter (Signed)
Continued Oxy IR PA requests per CoverMyMeds received. Danielle with [facility] confirms "Patient picked up a few days ago, order ran through with insurance coverage".

## 2021-03-07 ENCOUNTER — Other Ambulatory Visit: Payer: Self-pay | Admitting: Family Medicine

## 2021-03-07 ENCOUNTER — Encounter: Payer: Self-pay | Admitting: Gynecologic Oncology

## 2021-03-07 DIAGNOSIS — M353 Polymyalgia rheumatica: Secondary | ICD-10-CM

## 2021-03-08 ENCOUNTER — Other Ambulatory Visit: Payer: Self-pay

## 2021-03-08 ENCOUNTER — Encounter: Payer: Self-pay | Admitting: Gynecologic Oncology

## 2021-03-08 ENCOUNTER — Inpatient Hospital Stay: Payer: No Typology Code available for payment source | Attending: Gynecologic Oncology | Admitting: Gynecologic Oncology

## 2021-03-08 VITALS — BP 145/82 | HR 82 | Temp 97.1°F | Resp 18 | Ht 62.0 in | Wt 193.4 lb

## 2021-03-08 DIAGNOSIS — C519 Malignant neoplasm of vulva, unspecified: Secondary | ICD-10-CM | POA: Diagnosis not present

## 2021-03-08 DIAGNOSIS — Z79899 Other long term (current) drug therapy: Secondary | ICD-10-CM | POA: Insufficient documentation

## 2021-03-08 DIAGNOSIS — Z791 Long term (current) use of non-steroidal anti-inflammatories (NSAID): Secondary | ICD-10-CM | POA: Insufficient documentation

## 2021-03-08 DIAGNOSIS — K5730 Diverticulosis of large intestine without perforation or abscess without bleeding: Secondary | ICD-10-CM | POA: Diagnosis not present

## 2021-03-08 DIAGNOSIS — I872 Venous insufficiency (chronic) (peripheral): Secondary | ICD-10-CM | POA: Insufficient documentation

## 2021-03-08 DIAGNOSIS — Z9079 Acquired absence of other genital organ(s): Secondary | ICD-10-CM

## 2021-03-08 DIAGNOSIS — M353 Polymyalgia rheumatica: Secondary | ICD-10-CM | POA: Insufficient documentation

## 2021-03-08 DIAGNOSIS — K449 Diaphragmatic hernia without obstruction or gangrene: Secondary | ICD-10-CM | POA: Insufficient documentation

## 2021-03-08 NOTE — Patient Instructions (Signed)
Great to see you!  You have healed very well.  The incision is closed completely.  I do not see any evidence of sutures.  Have a wonderful time at the [employer].  Going to recommend coming to see me every 3-4 months for a vaginal and vulvar exam just to make sure no new spots pop up during your immunotherapy.

## 2021-03-08 NOTE — Progress Notes (Signed)
Gynecologic Oncology Return Clinic Visit  03/08/21  Reason for Visit: incision check  Treatment History: Oncology History Overview Note  BRAF: undetectable (negative)   Melanoma of vulva ([place])  10/26/2020 Initial Diagnosis   She was noted to have bloody discharge in her underwear.  She denies pain   12/07/2020 Initial Biopsy   FINAL MICROSCOPIC DIAGNOSIS:   A.   RIGHT VULVA, BIOPSY: PRIMARY MALIGNANT MELANOMA OF VULVA, MELANOMA  TABLE BELOW  MALIGNANT MELANOMA (AJCC 8TH EDITION):  PROCEDURE: BIOPSY  SPECIMEN ANATOMIC SITE: RIGHT VULVA  BRESLOW'S DEPTH/MAXIMUM TUMOR THICKNESS: 5.05 MM  CLARK/ANATOMIC LEVEL* IV  MARGINS:  PERIPHERAL: INVOLVED  DEEP: FREE  ULCERATION: PRESENT  MITOTIC INDEX: 11/mm2  LYMPHO-VASCULAR INVASION: PRESENT*  NEUTROTROPISM: ABSENT  TUMOR-INFILTRATING LYMPHOCYTES: NON-BRISK  LYMPH NODES: N/A  PATHOLOGIC STAGE: PT4B NX MX  *VULVAR MELANOMAS MAY NOT HAVE THE SAME BEHAVIOR STAGE FOR STAGE AS SUPERFICIAL SPREADING CUTANEOUS MELANOMA, AND LANDMARKS SUCH AS BRESLOW'S DEPTH MAY NOT APPLY. NONETHELESS, THIS SHOULD BE TREATED AS A PT4B MELANOMA AND COMPLETE EXCISION, WITH POSSIBLE LYMPH NODE EVALUATION, UNDERTAKEN. SEE COMMENT FOR IHC PROFILE.    COMMENT:  Sections show confluent and nested growth of atypical melanocytes along vulvar epithelium in an in-situ melanoma growth pattern at the periphery, with a central large ulcerated nodule of similar atypical  melanocytes, consonant with primary vulvar melanoma. Notably, IHC was performed to evaluate the lineage and is diagnostic for melanoma. Lesional melanocytes are positive with S100, MelanA, and show a high ki-67 uptake, and are negative with CDX-2, synaptophysin, CK20, TTF-1, CK7, CD56, and lymphoid markers CD5, CD3, CD20 highlight only background lymphocytes.    12/17/2020 Initial Diagnosis   Melanoma of vulva ([place])   12/17/2020 Cancer Staging   Staging form: Melanoma of the Skin, AJCC 8th Edition - Clinical  stage from 12/17/2020: Stage IIC (cT4b, cN0, cM0) - Signed by [name], MD on 12/17/2020  Stage prefix: Initial diagnosis    12/30/2020 PET scan   1. The only potential hypermetabolic finding of note is a small flat focus of accentuated metabolic activity along the anterior vaginal vestibule/perineum, maximum SUV 6.5. Most common cause for this type of appearance would be a trace amount of incontinence of urinary FDG, but given the proximity to the reported vulvar melanoma, this small focus is considered indeterminate. 2. Other imaging findings of potential clinical significance: Aortic Atherosclerosis (ICD10-I70.0). Small type 1 hiatal hernia. Palatine tonsilloliths. Colonic diverticulosis.   01/18/2021 Surgery   Preoperative Diagnosis: Vulvar melanoma  Postoperative Diagnosis: Same  Procedures performed: Wide radical vulvectomy, R inguinofemoral sentinel LN biopsy  Attending Surgeon: [name], MD  Fellow: [name]  Resident: Melony Overly, MD; Darrin Nipper, MD  Anesthesia: General endotracheal  Findings:  1. Vulva with biopsy site changes at R labia minora adjacent to the clitoris. No visible tumor. 2. Inguinofemoral nodes not clinically enlarged or abnormal.   Specimens:  1. R inguinal sentinel LN, hot and blue 2. Portion of vulva 3. Superior deep margin    01/18/2021 Pathology Results   Only melanoma in situ seen in final resection, SLN negative   02/08/2021 -  Chemotherapy    Patient is on Treatment Plan: MELANOMA PEMBROLIZUMAB Q21D         Interval History: Patient presents today for an incision check.  She reports overall doing well.  She has received her second cycle of pembrolizumab.  She had a little bit of nausea with her first cycle, but otherwise she denies any significant symptoms.  She denies any  vaginal bleeding, discharge, or vulvar pain.  She is looking forward to an upcoming trip to the [employer].  Started adjuvant treatment  with pembrolizumab on 5/17.  Past Medical/Surgical History: Past Medical History:  Diagnosis Date   BMI 34.0-34.9,adult    Family history of melanoma    Melanotic macule of vulvar labia    Migraine    Polymyalgia rheumatica ([place])    Venous insufficiency     History reviewed. No pertinent surgical history.  Family History  Problem Relation Age of Onset   Diabetes Mother    AVM Father    Melanoma Father        multiple melanomas dx between 76-90   Cancer Father 80       tumor on kidney   Cancer Paternal Grandfather        laryngeal cancer   Cancer Daughter 77       appendiceal cancer   Breast cancer Neg Hx    Ovarian cancer Neg Hx    Colon cancer Neg Hx    Uterine cancer Neg Hx     Social History   Socioeconomic History   Marital status: Married    Spouse name: Not on file   Number of children: 2   Years of education: Not on file   Highest education level: Not on file  Occupational History   Occupation: shelf clerk   Occupation: stocking  Tobacco Use   Smoking status: Never   Smokeless tobacco: Never  Vaping Use   Vaping Use: Never used  Substance and Sexual Activity   Alcohol use: Yes    Comment: occasional   Drug use: No   Sexual activity: Not Currently  Other Topics Concern   Not on file  Social History Narrative   Not on file   Social Determinants of Health   Financial Resource Strain: Not on file  Food Insecurity: Not on file  Transportation Needs: Not on file  Physical Activity: Not on file  Stress: Not on file  Social Connections: Not on file    Current Medications:  Current Outpatient Medications:    acetaminophen (ACETAMINOPHEN EXTRA STRENGTH) 500 MG tablet, Take 2 tablets by mouth every 6 hours as needed for pain, Disp: 100 tablet, Rfl: 2   bisacodyl (DULCOLAX) 5 MG EC tablet, Take 5 mg by mouth daily as needed for moderate constipation., Disp: , Rfl:    hydrochlorothiazide (HYDRODIURIL) 25 MG tablet, Take 1 tablet (25 mg total) by  mouth daily as needed., Disp: 90 tablet, Rfl: 3   ibuprofen (ADVIL) 600 MG tablet, Take 1 tablet by mouth every 6 hours as needed for pain (Patient taking differently: 400 mg.), Disp: 60 tablet, Rfl: 0   ondansetron (ZOFRAN) 8 MG tablet, Take 1 tablet (8 mg total) by mouth 2 times daily as needed for nausea or vomiting., Disp: 30 tablet, Rfl: 1   oxyCODONE (OXY IR/ROXICODONE) 5 MG immediate release tablet, Take 1 tablet (5 mg total) by mouth every 4 (four) hours as needed for pain, Disp: 90 tablet, Rfl: 0   polyethylene glycol (MIRALAX / GLYCOLAX) 17 g packet, Take 17 g by mouth daily as needed., Disp: , Rfl:    vitamin B-12 (CYANOCOBALAMIN) 1000 MCG tablet, Take by mouth every other day., Disp: , Rfl:    VITAMIN D, CHOLECALCIFEROL, PO, Take 2,000 Int'l Units/1.73m by mouth., Disp: , Rfl:    prochlorperazine (COMPAZINE) 10 MG tablet, Take 1 tablet (10 mg total) by mouth every 6 (six) hours as needed  for nausea or vomiting. (Patient not taking: Reported on 03/07/2021), Disp: 30 tablet, Rfl: 1  Review of Systems: Denies appetite changes, fevers, chills, fatigue, unexplained weight changes. Denies hearing loss, neck lumps or masses, mouth sores, ringing in ears or voice changes. Denies cough or wheezing.  Denies shortness of breath. Denies chest pain or palpitations. Denies leg swelling. Denies abdominal distention, pain, blood in stools, constipation, diarrhea, nausea, vomiting, or early satiety. Denies pain with intercourse, dysuria, frequency, hematuria or incontinence. Denies hot flashes, pelvic pain, vaginal bleeding or vaginal discharge.   Denies joint pain, back pain or muscle pain/cramps. Denies itching, rash, or wounds. Denies dizziness, headaches, numbness or seizures. Denies swollen lymph nodes or glands, denies easy bruising or bleeding. Denies anxiety, depression, confusion, or decreased concentration.  Physical Exam: BP (!) 145/82 (BP Location: Right Arm, Patient Position: Sitting)    Pulse 82   Temp (!) 97.1 F (36.2 C) (Tympanic)   Resp 18   Ht '5\' 2"'  (1.575 m)   Wt 193 lb 6.4 oz (87.7 kg)   SpO2 98%   BMI 35.37 kg/m  General: Alert, oriented, no acute distress. HEENT: Atraumatic, normocephalic, sclera anicteric. Chest: Unlabored breathing on room air. Extremities: Grossly normal range of motion.  Warm, well perfused.  No edema bilaterally. Skin: No rashes or lesions noted. GU: Well-healed vulvar incision.  No suture visible.  No erythema, induration, or exudate.  Mild deviation of anatomy secondary to excision towards patient's right.  No tenderness with palpation over prior incision line.  Laboratory & Radiologic Studies: None new  Assessment & Plan: Ariel Rios is a 65 y.o. woman with Stage IIC vulvar melanoma who presents for an incision check.  Patient is doing very well.  She has started adjuvant immunotherapy.  She comes in today for an incision check.  Her incision has basically completely healed and looks great.  She is asymptomatic.  She will have regular follow-up with Dr. Alvy Bimler during treatment.  I suggested that we have visits every 3-4 months for a pelvic exam.   22 minutes of total time was spent for this patient encounter, including preparation, face-to-face counseling with the patient and coordination of care, and documentation of the encounter.  [name], MD  Division of Gynecologic Oncology  Department of Obstetrics and Gynecology  [facility]

## 2021-03-15 ENCOUNTER — Telehealth: Payer: Self-pay

## 2021-03-15 ENCOUNTER — Encounter: Payer: Self-pay | Admitting: Hematology and Oncology

## 2021-03-15 NOTE — Telephone Encounter (Signed)
Called regarding mychart message. Edema to lower legs. She is at the [employer] and will come home on Saturday. Denies pain and discomfort at this time. If anything changes she will go to the ER to be evaluated.

## 2021-03-22 ENCOUNTER — Other Ambulatory Visit (HOSPITAL_COMMUNITY): Payer: Self-pay

## 2021-03-22 ENCOUNTER — Other Ambulatory Visit: Payer: Self-pay

## 2021-03-22 ENCOUNTER — Encounter: Payer: Self-pay | Admitting: Hematology and Oncology

## 2021-03-22 ENCOUNTER — Ambulatory Visit (HOSPITAL_BASED_OUTPATIENT_CLINIC_OR_DEPARTMENT_OTHER)
Admission: RE | Admit: 2021-03-22 | Discharge: 2021-03-22 | Disposition: A | Discharge status: Home / Self Care | Payer: No Typology Code available for payment source | Source: Ambulatory Visit | Attending: Hematology and Oncology | Admitting: Hematology and Oncology

## 2021-03-22 ENCOUNTER — Ambulatory Visit: Payer: No Typology Code available for payment source

## 2021-03-22 ENCOUNTER — Inpatient Hospital Stay: Payer: No Typology Code available for payment source

## 2021-03-22 ENCOUNTER — Inpatient Hospital Stay: Payer: No Typology Code available for payment source | Admitting: Hematology and Oncology

## 2021-03-22 VITALS — BP 131/67 | HR 81 | Temp 97.4°F | Resp 18 | Ht 62.0 in | Wt 191.2 lb

## 2021-03-22 DIAGNOSIS — R600 Localized edema: Secondary | ICD-10-CM

## 2021-03-22 DIAGNOSIS — C519 Malignant neoplasm of vulva, unspecified: Secondary | ICD-10-CM

## 2021-03-22 DIAGNOSIS — M353 Polymyalgia rheumatica: Secondary | ICD-10-CM

## 2021-03-22 DIAGNOSIS — Z5112 Encounter for antineoplastic immunotherapy: Secondary | ICD-10-CM | POA: Diagnosis not present

## 2021-03-22 LAB — CBC WITH DIFFERENTIAL (CANCER CENTER ONLY)
Abs Immature Granulocytes: 0.01 10*3/uL (ref 0.00–0.07)
Basophils Absolute: 0 10*3/uL (ref 0.0–0.1)
Basophils Relative: 0 %
Eosinophils Absolute: 0.1 10*3/uL (ref 0.0–0.5)
Eosinophils Relative: 2 %
HCT: 31.3 % — ABNORMAL LOW (ref 36.0–46.0)
Hemoglobin: 10.2 g/dL — ABNORMAL LOW (ref 12.0–15.0)
Immature Granulocytes: 0 %
Lymphocytes Relative: 15 %
Lymphs Abs: 1.2 10*3/uL (ref 0.7–4.0)
MCH: 28.8 pg (ref 26.0–34.0)
MCHC: 32.6 g/dL (ref 30.0–36.0)
MCV: 88.4 fL (ref 80.0–100.0)
Monocytes Absolute: 0.4 10*3/uL (ref 0.1–1.0)
Monocytes Relative: 5 %
Neutro Abs: 6 10*3/uL (ref 1.7–7.7)
Neutrophils Relative %: 78 %
Platelet Count: 441 10*3/uL — ABNORMAL HIGH (ref 150–400)
RBC: 3.54 MIL/uL — ABNORMAL LOW (ref 3.87–5.11)
RDW: 12.9 % (ref 11.5–15.5)
WBC Count: 7.8 10*3/uL (ref 4.0–10.5)
nRBC: 0 % (ref 0.0–0.2)

## 2021-03-22 LAB — CMP (CANCER CENTER ONLY)
ALT: 10 U/L (ref 0–44)
AST: 11 U/L — ABNORMAL LOW (ref 15–41)
Albumin: 3.2 g/dL — ABNORMAL LOW (ref 3.5–5.0)
Alkaline Phosphatase: 62 U/L (ref 38–126)
Anion gap: 13 (ref 5–15)
BUN: 18 mg/dL (ref 8–23)
CO2: 24 mmol/L (ref 22–32)
Calcium: 9.7 mg/dL (ref 8.9–10.3)
Chloride: 102 mmol/L (ref 98–111)
Creatinine: 0.63 mg/dL (ref 0.44–1.00)
GFR, Estimated: >60 mL/min (ref >60)
Glucose, Bld: 115 mg/dL — ABNORMAL HIGH (ref 70–99)
Potassium: 3.6 mmol/L (ref 3.5–5.1)
Sodium: 139 mmol/L (ref 135–145)
Total Bilirubin: 0.4 mg/dL (ref 0.3–1.2)
Total Protein: 7.5 g/dL (ref 6.5–8.1)

## 2021-03-22 LAB — TSH: TSH: 3.287 u[IU]/mL (ref 0.308–3.960)

## 2021-03-22 MED ORDER — SODIUM CHLORIDE 0.9 % IV SOLN: 200.0000 mg | Freq: Once | INTRAVENOUS | Status: DC

## 2021-03-22 MED ORDER — SODIUM CHLORIDE 0.9 % IV SOLN
Freq: Once | INTRAVENOUS | Status: AC
  Filled 2021-03-22: qty 250

## 2021-03-22 MED ORDER — SODIUM CHLORIDE 0.9 % IV SOLN
200.0000 mg | Freq: Once | INTRAVENOUS | Status: AC
  Administered 2021-03-22: 200 mg via INTRAVENOUS
  Filled 2021-03-22: qty 8

## 2021-03-22 MED ORDER — OXYCODONE HCL 5 MG PO TABS
5.0000 mg | ORAL_TABLET | ORAL | 0 refills | Status: DC | PRN
  Filled 2021-03-22: qty 90, 15d supply, fill #0

## 2021-03-22 NOTE — Assessment & Plan Note (Signed)
Her symptoms of polymyalgia rheumatica appears worse She has significant muscle stiffness and intermittent muscle pain that could be debilitating at times I recommend her to continue ibuprofen in addition to acetaminophen and oxycodone I refilled her prescription of oxycodone today

## 2021-03-22 NOTE — Assessment & Plan Note (Addendum)
She has significant bilateral lower extremity edema, right greater than left I suspect the ibuprofen might have caused fluid retention Her surgery in her pelvis could also exacerbate venous drainage on the right side Given her history of cancer and recent travel, I will order urgent venous Doppler ultrasound this afternoon to rule out DVT At the time of dictation, the result of the venous Doppler ultrasound was negative for I advised the patient to reduce NSAID use as much as possible and to wear elastic compression hose and leg elevation as much as possible

## 2021-03-22 NOTE — Patient Instructions (Signed)
[facility]ICAL [facility]COL[facility]  Discharge Instructions: ?Thank you for choosing [facility] to provide your oncology and hematology care.  ? ?If you have a lab appointment with the Cancer Center, please go directly to the Cancer Center and check in at the registration area. ?  ?Wear comfortable clothing and clothing appropriate for easy access to any Portacath or PICC line.  ? ?We strive to give you quality time with your provider. You may need to reschedule your appointment if you arrive late (15 or more minutes).  Arriving late affects you and other patients whose appointments are after yours.  Also, if you miss three or more appointments without notifying the office, you may be dismissed from the clinic at the provider?s discretion.    ?  ?For prescription refill requests, have your pharmacy contact our office and allow 72 hours for refills to be completed.   ? ?Today you received the following chemotherapy and/or immunotherapy agents: Keytruda ?  ?To help prevent nausea and vomiting after your treatment, we encourage you to take your nausea medication as directed. ? ?BELOW ARE SYMPTOMS THAT SHOULD BE REPORTED IMMEDIATELY: ?*FEVER GREATER THAN 100.4 F (38 ?C) OR HIGHER ?*CHILLS OR SWEATING ?*NAUSEA AND VOMITING THAT IS NOT CONTROLLED WITH YOUR NAUSEA MEDICATION ?*UNUSUAL SHORTNESS OF BREATH ?*UNUSUAL BRUISING OR BLEEDING ?*URINARY PROBLEMS (pain or burning when urinating, or frequent urination) ?*BOWEL PROBLEMS (unusual diarrhea, constipation, pain near the anus) ?TENDERNESS IN MOUTH AND THROAT WITH OR WITHOUT PRESENCE OF ULCERS (sore throat, sores in mouth, or a toothache) ?UNUSUAL RASH, SWELLING OR PAIN  ?UNUSUAL VAGINAL DISCHARGE OR ITCHING  ? ?Items with * indicate a potential emergency and should be followed up as soon as possible or go to the Emergency Department if any problems should occur. ? ?Please show the CHEMOTHERAPY ALERT CARD or IMMUNOTHERAPY ALERT CARD at check-in to the  Emergency Department and triage nurse. ? ?Should you have questions after your visit or need to cancel or reschedule your appointment, please contact [facility]  Dept: [phone]  and follow the prompts.  Office hours are 8:00 a.m. to 4:30 p.m. Monday - Friday. Please note that voicemails left after 4:00 p.m. may not be returned until the following business day.  We are closed weekends and major holidays. You have access to a nurse at all times for urgent questions. Please call the main number to the clinic Dept: [phone] and follow the prompts. ? ? ?For any non-urgent questions, you may also contact your provider using MyChart. We now offer e-Visits for anyone 18 and older to request care online for non-urgent symptoms. For details visit [email or web address]. ?  ?Also download the MyChart app! Go to the app store, search "MyChart", open the app, select [facility], and log in with your MyChart username and password. ? ?Due to Covid, a mask is required upon entering the hospital/clinic. If you do not have a mask, one will be given to you upon arrival. For doctor visits, patients may have 1 support person aged 18 or older with them. For treatment visits, patients cannot have anyone with them due to current Covid guidelines and our immunocompromised population.  ? ?

## 2021-03-22 NOTE — Assessment & Plan Note (Signed)
From treatment standpoint, she tolerated immunotherapy well except for diffuse musculoskeletal pain  she did feel that her symptoms of polymyalgia rheumatica seems a little worse Overall, she felt confident she can tolerate the treatment for 1 year I plan to repeat PET CT scan after 3-4 treatment

## 2021-03-22 NOTE — Progress Notes (Signed)
Bilateral lower extremity venous duplex has been completed. Preliminary results can be found in CV Proc through chart review.  Results were given to [facility] at Dr. Calton Dach office.  03/22/21 1:29 PM [facility] RVT

## 2021-03-22 NOTE — Progress Notes (Signed)
Given appt for bilateral doppler at 1 pm today at [facility]. She verbalized understanding.

## 2021-03-22 NOTE — Progress Notes (Signed)
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[facility]) From treatment standpoint, she tolerated immunotherapy well except for diffuse musculoskeletal pain  she did feel that her symptoms of polymyalgia rheumatica seems a little worse Overall, she felt confident she can tolerate the treatment for 1 year I plan to repeat PET CT scan after 3-4 treatment  PMR (polymyalgia rheumatica) (HCC) Her symptoms of polymyalgia rheumatica appears worse She has significant muscle stiffness and intermittent muscle pain that could be debilitating at times I recommend her to continue ibuprofen in addition to acetaminophen and oxycodone I refilled her prescription of oxycodone today  Bilateral leg edema She has significant bilateral lower extremity edema, right greater than left I suspect the ibuprofen might have caused fluid retention Her surgery in her pelvis could also exacerbate venous drainage on the right side Given her history of cancer and recent travel, I will order urgent venous Doppler ultrasound this afternoon to rule out DVT At the time of dictation, the result of the venous Doppler ultrasound was negative for I advised the patient to reduce NSAID use as much as possible and to wear elastic compression hose and leg elevation as much as possible  No orders of the defined types were placed in this encounter.   All questions were answered. The patient knows to call the clinic with any problems, questions or concerns. The total time spent in the appointment was 30 minutes encounter with patients including review of chart and various tests results, discussions about plan of care and coordination of care plan   [name], MD 03/22/2021 1:41 PM  INTERVAL HISTORY: Please see below for problem oriented charting. She returns for treatment and follow-up She had recent trip with family  and developed severe bilateral lower extremity edema She denies pain Her edema is worse on the right side She also felt worsening symptoms of polymyalgia rheumatica She has a take pain medicine on a regular basis along with acetaminophen and ibuprofen  SUMMARY OF ONCOLOGIC HISTORY: Oncology History Overview Note  BRAF: undetectable (negative)   Melanoma of vulva ([place])  10/26/2020 Initial Diagnosis   She was noted to have bloody discharge in her underwear.  She denies pain   12/07/2020 Initial Biopsy   FINAL MICROSCOPIC DIAGNOSIS:   A.   RIGHT VULVA, BIOPSY: PRIMARY MALIGNANT MELANOMA OF VULVA, MELANOMA  TABLE BELOW  MALIGNANT MELANOMA (AJCC 8TH EDITION):  PROCEDURE: BIOPSY  SPECIMEN ANATOMIC SITE: RIGHT VULVA  BRESLOW'S DEPTH/MAXIMUM TUMOR THICKNESS: 5.05 MM  CLARK/ANATOMIC LEVEL* IV  MARGINS:  PERIPHERAL: INVOLVED  DEEP: FREE  ULCERATION: PRESENT  MITOTIC INDEX: 11/mm2  LYMPHO-VASCULAR INVASION: PRESENT*  NEUTROTROPISM: ABSENT  TUMOR-INFILTRATING LYMPHOCYTES: NON-BRISK  LYMPH NODES: N/A  PATHOLOGIC STAGE: PT4B NX MX  *VULVAR MELANOMAS MAY NOT HAVE THE SAME BEHAVIOR STAGE FOR STAGE AS SUPERFICIAL SPREADING CUTANEOUS MELANOMA, AND LANDMARKS SUCH AS BRESLOW'S DEPTH MAY NOT APPLY. NONETHELESS, THIS SHOULD BE TREATED AS A PT4B MELANOMA AND COMPLETE EXCISION, WITH POSSIBLE LYMPH NODE EVALUATION, UNDERTAKEN. SEE COMMENT FOR IHC PROFILE.    COMMENT:  Sections show confluent and nested growth of atypical melanocytes along vulvar epithelium in an in-situ melanoma growth pattern at the periphery, with a central large ulcerated nodule of similar atypical  melanocytes, consonant with primary vulvar melanoma. Notably, IHC was performed to evaluate the lineage and is diagnostic for melanoma. Lesional melanocytes are positive with S100, MelanA, and show a high  ki-67 uptake, and are negative with CDX-2, synaptophysin, CK20, TTF-1, CK7, CD56, and lymphoid markers CD5, CD3, CD20 highlight only  background lymphocytes.    12/17/2020 Initial Diagnosis   Melanoma of vulva ([place])   12/17/2020 Cancer Staging   Staging form: Melanoma of the Skin, AJCC 8th Edition - Clinical stage from 12/17/2020: Stage IIC (cT4b, cN0, cM0) - Signed by [name], MD on 12/17/2020  Stage prefix: Initial diagnosis    12/30/2020 PET scan   1. The only potential hypermetabolic finding of note is a small flat focus of accentuated metabolic activity along the anterior vaginal vestibule/perineum, maximum SUV 6.5. Most common cause for this type of appearance would be a trace amount of incontinence of urinary FDG, but given the proximity to the reported vulvar melanoma, this small focus is considered indeterminate. 2. Other imaging findings of potential clinical significance: Aortic Atherosclerosis (ICD10-I70.0). Small type 1 hiatal hernia. Palatine tonsilloliths. Colonic diverticulosis.   01/18/2021 Surgery   Preoperative Diagnosis: Vulvar melanoma  Postoperative Diagnosis: Same  Procedures performed: Wide radical vulvectomy, R inguinofemoral sentinel LN biopsy  Attending Surgeon: [name], MD  Fellow: [name]  Resident: Melony Overly, MD; Darrin Nipper, MD  Anesthesia: General endotracheal  Findings:  1. Vulva with biopsy site changes at R labia minora adjacent to the clitoris. No visible tumor. 2. Inguinofemoral nodes not clinically enlarged or abnormal.   Specimens:  1. R inguinal sentinel LN, hot and blue 2. Portion of vulva 3. Superior deep margin    01/18/2021 Pathology Results   Only melanoma in situ seen in final resection, SLN negative   02/08/2021 -  Chemotherapy    Patient is on Treatment Plan: MELANOMA PEMBROLIZUMAB Q21D         REVIEW OF SYSTEMS:   Constitutional: Denies fevers, chills or abnormal weight loss Eyes: Denies blurriness of vision Ears, nose, mouth, throat, and face: Denies mucositis or sore throat Respiratory: Denies cough, dyspnea or  wheezes Cardiovascular: Denies palpitation, chest discomfort  Gastrointestinal:  Denies nausea, heartburn or change in bowel habits Skin: Denies abnormal skin rashes Lymphatics: Denies new lymphadenopathy or easy bruising Neurological:Denies numbness, tingling or new weaknesses Behavioral/Psych: Mood is stable, no new changes  All other systems were reviewed with the patient and are negative.  I have reviewed the past medical history, past surgical history, social history and family history with the patient and they are unchanged from previous note.  ALLERGIES:  has No Known Allergies.  MEDICATIONS:  Current Outpatient Medications  Medication Sig Dispense Refill   acetaminophen (ACETAMINOPHEN EXTRA STRENGTH) 500 MG tablet Take 2 tablets by mouth every 6 hours as needed for pain 100 tablet 2   bisacodyl (DULCOLAX) 5 MG EC tablet Take 5 mg by mouth daily as needed for moderate constipation.     hydrochlorothiazide (HYDRODIURIL) 25 MG tablet Take 1 tablet (25 mg total) by mouth daily as needed. 90 tablet 3   ibuprofen (ADVIL) 600 MG tablet Take 1 tablet by mouth every 6 hours as needed for pain (Patient taking differently: 400 mg.) 60 tablet 0   ondansetron (ZOFRAN) 8 MG tablet Take 1 tablet (8 mg total) by mouth 2 times daily as needed for nausea or vomiting. 30 tablet 1   oxyCODONE (OXY IR/ROXICODONE) 5 MG immediate release tablet Take 1 tablet (5 mg total) by mouth every 4 (four) hours as needed for pain 90 tablet 0   polyethylene glycol (MIRALAX / GLYCOLAX) 17 g packet Take 17 g by mouth daily as needed.  prochlorperazine (COMPAZINE) 10 MG tablet Take 1 tablet (10 mg total) by mouth every 6 (six) hours as needed for nausea or vomiting. (Patient not taking: Reported on 03/07/2021) 30 tablet 1   vitamin B-12 (CYANOCOBALAMIN) 1000 MCG tablet Take by mouth every other day.     VITAMIN D, CHOLECALCIFEROL, PO Take 2,000 Int'l Units/1.40m by mouth.     No current facility-administered  medications for this visit.    PHYSICAL EXAMINATION: ECOG PERFORMANCE STATUS: 1 - Symptomatic but completely ambulatory  Vitals:   03/22/21 0901  BP: 131/67  Pulse: 81  Resp: 18  Temp: (!) 97.4 F (36.3 C)  SpO2: 98%   Filed Weights   03/22/21 0901  Weight: 191 lb 3.2 oz (86.7 kg)    GENERAL:alert, no distress and comfortable SKIN: skin color, texture, turgor are normal, no rashes or significant lesions EYES: normal, Conjunctiva are pink and non-injected, sclera clear OROPHARYNX:no exudate, no erythema and lips, buccal mucosa, and tongue normal  NECK: supple, thyroid normal size, non-tender, without nodularity LYMPH:  no palpable lymphadenopathy in the cervical, axillary or inguinal LUNGS: clear to auscultation and percussion with normal breathing effort HEART: regular rate & rhythm and no murmurs with moderate bilateral lower extremity edema ABDOMEN:abdomen soft, non-tender and normal bowel sounds Musculoskeletal:no cyanosis of digits and no clubbing  NEURO: alert & oriented x 3 with fluent speech, no focal motor/sensory deficits  LABORATORY DATA:  I have reviewed the data as listed    Component Value Date/Time   NA 139 03/22/2021 0844   K 3.6 03/22/2021 0844   CL 102 03/22/2021 0844   CO2 24 03/22/2021 0844   GLUCOSE 115 (H) 03/22/2021 0844   BUN 18 03/22/2021 0844   CREATININE 0.63 03/22/2021 0844   CREATININE 0.82 08/23/2020 1048   CALCIUM 9.7 03/22/2021 0844   PROT 7.5 03/22/2021 0844   ALBUMIN 3.2 (L) 03/22/2021 0844   AST 11 (L) 03/22/2021 0844   ALT 10 03/22/2021 0844   ALKPHOS 62 03/22/2021 0844   BILITOT 0.4 03/22/2021 0844   GFRNONAA >60 03/22/2021 0844   GFRNONAA 77 08/23/2020 1048   GFRAA 89 08/23/2020 1048    No results found for: SPEP, UPEP  Lab Results  Component Value Date   WBC 7.8 03/22/2021   NEUTROABS 6.0 03/22/2021   HGB 10.2 (L) 03/22/2021   HCT 31.3 (L) 03/22/2021   MCV 88.4 03/22/2021   PLT 441 (H) 03/22/2021      Chemistry       Component Value Date/Time   NA 139 03/22/2021 0844   K 3.6 03/22/2021 0844   CL 102 03/22/2021 0844   CO2 24 03/22/2021 0844   BUN 18 03/22/2021 0844   CREATININE 0.63 03/22/2021 0844   CREATININE 0.82 08/23/2020 1048      Component Value Date/Time   CALCIUM 9.7 03/22/2021 0844   ALKPHOS 62 03/22/2021 0844   AST 11 (L) 03/22/2021 0844   ALT 10 03/22/2021 0844   BILITOT 0.4 03/22/2021 0844

## 2021-03-23 LAB — T4: T4, Total: 8.2 ug/dL (ref 4.5–12.0)

## 2021-03-31 ENCOUNTER — Ambulatory Visit: Payer: Self-pay | Admitting: Genetic Counselor

## 2021-03-31 ENCOUNTER — Telehealth: Payer: Self-pay | Admitting: Genetic Counselor

## 2021-03-31 ENCOUNTER — Encounter: Payer: Self-pay | Admitting: Genetic Counselor

## 2021-03-31 DIAGNOSIS — Z1379 Encounter for other screening for genetic and chromosomal anomalies: Secondary | ICD-10-CM

## 2021-03-31 NOTE — Progress Notes (Signed)
HPI:  Ariel Rios was previously seen in the [facility] due to a personal and family history of melanoma and concerns regarding a hereditary predisposition to cancer. Please refer to our prior cancer genetics clinic note for more information regarding our discussion, assessment and recommendations, at the time. Ariel Rios recent genetic test results were disclosed to her, as were recommendations warranted by these results. These results and recommendations are discussed in more detail below.  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  She denies pain   12/07/2020 Initial Biopsy   FINAL MICROSCOPIC DIAGNOSIS:   A.   RIGHT VULVA, BIOPSY: PRIMARY MALIGNANT MELANOMA OF VULVA, MELANOMA  TABLE BELOW  MALIGNANT MELANOMA (AJCC 8TH EDITION):  PROCEDURE: BIOPSY  SPECIMEN ANATOMIC SITE: RIGHT VULVA  BRESLOW'S DEPTH/MAXIMUM TUMOR THICKNESS: 5.05 MM  CLARK/ANATOMIC LEVEL* IV  MARGINS:  PERIPHERAL: INVOLVED  DEEP: FREE  ULCERATION: PRESENT  MITOTIC INDEX: 11/mm2  LYMPHO-VASCULAR INVASION: PRESENT*  NEUTROTROPISM: ABSENT  TUMOR-INFILTRATING LYMPHOCYTES: NON-BRISK  LYMPH NODES: N/A  PATHOLOGIC STAGE: PT4B NX MX  *VULVAR MELANOMAS MAY NOT HAVE THE SAME BEHAVIOR STAGE FOR STAGE AS SUPERFICIAL SPREADING CUTANEOUS MELANOMA, AND LANDMARKS SUCH AS BRESLOW'S DEPTH MAY NOT APPLY. NONETHELESS, THIS SHOULD BE TREATED AS A PT4B MELANOMA AND COMPLETE EXCISION, WITH POSSIBLE LYMPH NODE EVALUATION, UNDERTAKEN. SEE COMMENT FOR IHC PROFILE.    COMMENT:  Sections show confluent and nested growth of atypical melanocytes along vulvar epithelium in an in-situ melanoma growth pattern at the periphery, with a central large ulcerated nodule of similar atypical  melanocytes, consonant with primary vulvar melanoma. Notably, IHC was performed to  evaluate the lineage and is diagnostic for melanoma. Lesional melanocytes are positive with S100, MelanA, and show a high ki-67 uptake, and are negative with CDX-2, synaptophysin, CK20, TTF-1, CK7, CD56, and lymphoid markers CD5, CD3, CD20 highlight only background lymphocytes.    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 The only potential hypermetabolic finding of note is a small flat focus of accentuated metabolic activity along the anterior vaginal vestibule/perineum, maximum SUV 6.5. Most common cause for this type of appearance would be a trace amount of incontinence of urinary FDG, but given the proximity to the reported vulvar melanoma, this small focus is considered indeterminate. 2. Other imaging findings of potential clinical significance: Aortic Atherosclerosis (ICD10-I70.0). Small type 1 hiatal hernia. Palatine tonsilloliths. Colonic diverticulosis.   01/18/2021 Surgery   Preoperative Diagnosis: Vulvar melanoma  Postoperative Diagnosis: Same  Procedures performed: Wide radical vulvectomy, R inguinofemoral sentinel LN biopsy  Attending Surgeon: [name], MD  Fellow: [name]  Resident: Melony Overly, MD; Darrin Nipper, MD  Anesthesia: General endotracheal  Findings:  1. Vulva with biopsy site changes at R labia minora adjacent to the clitoris. No visible tumor. 2. Inguinofemoral nodes not clinically enlarged or abnormal.   Specimens:  1. R inguinal sentinel LN, hot and blue 2. Portion of vulva 3. Superior deep margin    01/18/2021 Pathology Results   Only melanoma in situ seen in final resection, SLN negative   02/08/2021 -  Chemotherapy    Patient is on Treatment Plan: MELANOMA  PEMBROLIZUMAB Q21D       03/29/2021 Genetic Testing   Negative genetic testing on the Multi-cancer +RNA panel.  The report date is March 29, 2021.  The Multi-Gene Panel offered by [facility] includes sequencing and/or deletion duplication testing of the following 84 genes: AIP, ALK, APC, ATM, AXIN2,BAP1,  BARD1, BLM, BMPR1A, BRCA1, BRCA2, BRIP1, CASR, CDC73, CDH1, CDK4, CDKN1B, CDKN1C, CDKN2A (p14ARF), CDKN2A (p16INK4a), CEBPA, CHEK2, CTNNA1, DICER1, DIS3L2, EGFR (c.2369C>T, p.Thr790Met variant only), EPCAM (Deletion/duplication testing only), FH, FLCN, GATA2, GPC3, GREM1 (Promoter region deletion/duplication testing only), HOXB13 (c.251G>A, p.Gly84Glu), HRAS, KIT, MAX, MEN1, MET, MITF (c.952G>A, p.Glu318Lys variant only), MLH1, MSH2, MSH3, MSH6, MUTYH, NBN, NF1, NF2, NTHL1, PALB2, PDGFRA, PHOX2B, PMS2, POLD1, POLE, POT1, PRKAR1A, PTCH1, PTEN, RAD50, RAD51C, RAD51D, RB1, RECQL4, RET, RUNX1, SDHAF2, SDHA (sequence changes only), SDHB, SDHC, SDHD, SMAD4, SMARCA4, SMARCB1, SMARCE1, STK11, SUFU, TERC, TERT, TMEM127, TP53, TSC1, TSC2, VHL, WRN and WT1.       FAMILY HISTORY:  We obtained a detailed, 4-generation family history.  Significant diagnoses are listed below: Family History  Problem Relation Age of Onset   Diabetes Mother    AVM Father    Melanoma Father        multiple melanomas dx between 22-90   Cancer Father 74       tumor on kidney   Cancer Paternal Grandfather        laryngeal cancer   Cancer Daughter 61       appendiceal cancer   Breast cancer Neg Hx    Ovarian cancer Neg Hx    Colon cancer Neg Hx    Uterine cancer Neg Hx     The patient has a son and daughter, her daughter was found to have appendiceal cancer at age 32.  She has three brothers who are cancer free.  Her mother is living and her father is deceased.   The patient's father reportedly had multiple melanomas removed starting at age 77 through his 16's.In his 33's, he was found to have a tumor on his kidneys. He had  four brothers and a sister who were all cancer free.  Both of his parents are deceased.  His father had laryngeal cancer.     The patient's mother did not have cancer.  She had two brothers and three sisters, none who reportedly had cancer.  Both of her parents are deceased from non-cancer related issues.   Ms. Krabbenhoft is unaware of previous family history of genetic testing for hereditary cancer risks. Patient's maternal ancestors are of [place] descent, and paternal ancestors are of [place] descent. There is no reported Ashkenazi Jewish ancestry. There is no known consanguinity.  GENETIC TEST RESULTS: Genetic testing reported out on March 29, 2021 through the Multi-cancer panel found no pathogenic mutations. The Multi-Gene Panel offered by [facility] includes sequencing and/or deletion duplication testing of the following 84 genes: AIP, ALK, APC, ATM, AXIN2,BAP1,  BARD1, BLM, BMPR1A, BRCA1, BRCA2, BRIP1, CASR, CDC73, CDH1, CDK4, CDKN1B, CDKN1C, CDKN2A (p14ARF), CDKN2A (p16INK4a), CEBPA, CHEK2, CTNNA1, DICER1, DIS3L2, EGFR (c.2369C>T, p.Thr790Met variant only), EPCAM (Deletion/duplication testing only), FH, FLCN, GATA2, GPC3, GREM1 (Promoter region deletion/duplication testing only), HOXB13 (c.251G>A, p.Gly84Glu), HRAS, KIT, MAX, MEN1, MET, MITF (c.952G>A, p.Glu318Lys variant only), MLH1, MSH2, MSH3, MSH6, MUTYH, NBN, NF1, NF2, NTHL1, PALB2, PDGFRA, PHOX2B, PMS2, POLD1, POLE, POT1, PRKAR1A, PTCH1, PTEN, RAD50, RAD51C, RAD51D, RB1, RECQL4, RET, RUNX1, SDHAF2, SDHA (sequence changes only), SDHB, SDHC, SDHD, SMAD4, SMARCA4, SMARCB1, SMARCE1, STK11,  SUFU, TERC, TERT, TMEM127, TP53, TSC1, TSC2, VHL, WRN and WT1.  The test report has been scanned into EPIC and is located under the Molecular Pathology section of the Results Review tab.  A portion of the result report is included below for reference.     We discussed with Ms. Latorre that because current genetic testing is not perfect, it is possible there may be a  gene mutation in one of these genes that current testing cannot detect, but that chance is small.  We also discussed, that there could be another gene that has not yet been discovered, or that we have not yet tested, that is responsible for the cancer diagnoses in the family. It is also possible there is a hereditary cause for the cancer in the family that Ms. Noyes did not inherit and therefore was not identified in her testing.  Therefore, it is important to remain in touch with cancer genetics in the future so that we can continue to offer Ms. Collymore the most up to date genetic testing.   ADDITIONAL GENETIC TESTING: We discussed with Ms. Aponte that her genetic testing was fairly extensive.  If there are genes identified to increase cancer risk that can be analyzed in the future, we would be happy to discuss and coordinate this testing at that time.    CANCER SCREENING RECOMMENDATIONS: Ms. Delange test result is considered negative (normal).  This means that we have not identified a hereditary cause for her personal and family history of melanoma at this time. Most cancers happen by chance and this negative test suggests that her cancer may fall into this category.    While reassuring, this does not definitively rule out a hereditary predisposition to cancer. It is still possible that there could be genetic mutations that are undetectable by current technology. There could be genetic mutations in genes that have not been tested or identified to increase cancer risk.  Therefore, it is recommended she continue to follow the cancer management and screening guidelines provided by her oncology and primary healthcare provider.   An individual's cancer risk and medical management are not determined by genetic test results alone. Overall cancer risk assessment incorporates additional factors, including personal medical history, family history, and any available genetic information that may result in  a personalized plan for cancer prevention and surveillance  RECOMMENDATIONS FOR FAMILY MEMBERS:  Individuals in this family might be at some increased risk of developing cancer, over the general population risk, simply due to the family history of cancer.  We recommended women in this family have a yearly mammogram beginning at age 15, or 65 years younger than the earliest onset of cancer, an annual clinical breast exam, and perform monthly breast self-exams. Women in this family should also have a gynecological exam as recommended by their primary provider. All family members should be referred for colonoscopy starting at age 19.  FOLLOW-UP: Lastly, we discussed with Ms. Ford that cancer genetics is a rapidly advancing field and it is possible that new genetic tests will be appropriate for her and/or her family members in the future. We encouraged her to remain in contact with cancer genetics on an annual basis so we can update her personal and family histories and let her know of advances in cancer genetics that may benefit this family.   Our contact number was provided. Ms. Woodside questions were answered to her satisfaction, and she knows she is welcome to call us at anytime with additional questions or  concerns.   Roma Kayser, [place], [facility] Licensed, Certified Genetic Counselor [email or web address]

## 2021-03-31 NOTE — Telephone Encounter (Signed)
Revealed negative genetic testing.  Discussed that we do not know why she has melanoma cancer or why there is cancer in the family. It could be due to a different gene that we are not testing, or maybe our current technology may not be able to pick something up.  It will be important for her to keep in contact with genetics to keep up with whether additional testing may be needed.

## 2021-04-12 ENCOUNTER — Inpatient Hospital Stay: Payer: No Typology Code available for payment source

## 2021-04-12 ENCOUNTER — Telehealth: Payer: Self-pay

## 2021-04-12 ENCOUNTER — Inpatient Hospital Stay
Payer: No Typology Code available for payment source | Attending: Hematology and Oncology | Admitting: Hematology and Oncology

## 2021-04-12 ENCOUNTER — Encounter: Payer: Self-pay | Admitting: Hematology and Oncology

## 2021-04-12 ENCOUNTER — Other Ambulatory Visit: Payer: Self-pay

## 2021-04-12 VITALS — BP 147/72 | HR 77 | Temp 97.5°F | Resp 18 | Ht 62.0 in | Wt 187.6 lb

## 2021-04-12 DIAGNOSIS — E876 Hypokalemia: Secondary | ICD-10-CM | POA: Insufficient documentation

## 2021-04-12 DIAGNOSIS — M353 Polymyalgia rheumatica: Secondary | ICD-10-CM | POA: Insufficient documentation

## 2021-04-12 DIAGNOSIS — D539 Nutritional anemia, unspecified: Secondary | ICD-10-CM | POA: Diagnosis not present

## 2021-04-12 DIAGNOSIS — R5383 Other fatigue: Secondary | ICD-10-CM | POA: Insufficient documentation

## 2021-04-12 DIAGNOSIS — Z808 Family history of malignant neoplasm of other organs or systems: Secondary | ICD-10-CM

## 2021-04-12 DIAGNOSIS — R630 Anorexia: Secondary | ICD-10-CM | POA: Diagnosis not present

## 2021-04-12 DIAGNOSIS — R634 Abnormal weight loss: Secondary | ICD-10-CM | POA: Diagnosis not present

## 2021-04-12 DIAGNOSIS — C519 Malignant neoplasm of vulva, unspecified: Secondary | ICD-10-CM | POA: Diagnosis present

## 2021-04-12 LAB — CMP (CANCER CENTER ONLY)
ALT: 8 U/L (ref 0–44)
AST: 9 U/L — ABNORMAL LOW (ref 15–41)
Albumin: 3.3 g/dL — ABNORMAL LOW (ref 3.5–5.0)
Alkaline Phosphatase: 57 U/L (ref 38–126)
Anion gap: 15 (ref 5–15)
BUN: 19 mg/dL (ref 8–23)
CO2: 28 mmol/L (ref 22–32)
Calcium: 9.7 mg/dL (ref 8.9–10.3)
Chloride: 99 mmol/L (ref 98–111)
Creatinine: 0.69 mg/dL (ref 0.44–1.00)
GFR, Estimated: >60 mL/min (ref >60)
Glucose, Bld: 119 mg/dL — ABNORMAL HIGH (ref 70–99)
Potassium: 2.7 mmol/L — CL (ref 3.5–5.1)
Sodium: 142 mmol/L (ref 135–145)
Total Bilirubin: 0.5 mg/dL (ref 0.3–1.2)
Total Protein: 7.6 g/dL (ref 6.5–8.1)

## 2021-04-12 LAB — CBC WITH DIFFERENTIAL (CANCER CENTER ONLY)
Abs Immature Granulocytes: 0.01 10*3/uL (ref 0.00–0.07)
Basophils Absolute: 0 10*3/uL (ref 0.0–0.1)
Basophils Relative: 0 %
Eosinophils Absolute: 0.1 10*3/uL (ref 0.0–0.5)
Eosinophils Relative: 2 %
HCT: 28.5 % — ABNORMAL LOW (ref 36.0–46.0)
Hemoglobin: 9 g/dL — ABNORMAL LOW (ref 12.0–15.0)
Immature Granulocytes: 0 %
Lymphocytes Relative: 18 %
Lymphs Abs: 1.4 10*3/uL (ref 0.7–4.0)
MCH: 27.5 pg (ref 26.0–34.0)
MCHC: 31.6 g/dL (ref 30.0–36.0)
MCV: 87.2 fL (ref 80.0–100.0)
Monocytes Absolute: 0.6 10*3/uL (ref 0.1–1.0)
Monocytes Relative: 8 %
Neutro Abs: 5.6 10*3/uL (ref 1.7–7.7)
Neutrophils Relative %: 72 %
Platelet Count: 443 10*3/uL — ABNORMAL HIGH (ref 150–400)
RBC: 3.27 MIL/uL — ABNORMAL LOW (ref 3.87–5.11)
RDW: 13.1 % (ref 11.5–15.5)
WBC Count: 7.8 10*3/uL (ref 4.0–10.5)
nRBC: 0 % (ref 0.0–0.2)

## 2021-04-12 LAB — TSH: TSH: 2.576 u[IU]/mL (ref 0.308–3.960)

## 2021-04-12 NOTE — Progress Notes (Signed)
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[facility]) Unfortunately, she have developed unbearable joint pain and severe arthralgia and fatigue She has virtually no quality of life while on treatment At this point in time, I recommend discontinuation of treatment We have extensive discussions about the risks, benefits, alternative approach to this situation I will order a PET CT scan to assess response to treatment If her PET CT scan is completely normal, I will send her back to her surgeon for future follow-up In the meantime, I will arrange for dermatology consult  PMR (polymyalgia rheumatica) ([place]) She has unbearable side effects with severe pain She will resume prednisone I recommend follow-up with her primary care doctor She could benefit from rheumatology consult   Deficiency anemia This is due to side effects of treatment and flare from polymyalgia rheumatica Observe closely for now I am hopeful with resumption of prednisone, her anemia will improve  Hypokalemia Could be related to side effects of hydrochlorothiazide I advised her to stop for few days and to start potassium rich diet I am hopeful with resumption of prednisone, her potassium will improve I plan to recheck it again next week  Orders Placed This Encounter  Procedures   NM PET Image Restage (PS) Skull Base to Thigh    Standing Status:   Future    Standing Expiration Date:   04/12/2022    Order Specific Question:   If indicated for the ordered procedure, I authorize the administration of a radiopharmaceutical per Radiology protocol    Answer:   Yes    Order Specific Question:   Preferred imaging location?    Answer:   [facility]    Order Specific Question:   Radiology Contrast Protocol - do NOT remove file path    Answer:   [name]    Ambulatory referral to Dermatology    Referral Priority:   Routine    Referral Type:   Consultation    Referral Reason:   Specialty Services Required    Referred to Provider:   Jarome Matin, MD    Requested Specialty:   Dermatology    Number of Visits Requested:   1    All questions were answered. The patient knows to call the clinic with any problems, questions or concerns. The total time spent in the appointment was 40 minutes encounter with patients including review of chart and various tests results, discussions about plan of care and coordination of care plan   [name], MD 04/12/2021 3:50 PM  INTERVAL HISTORY: Please see below for problem oriented charting. She is seen prior to treatment She felt terrible since last time I saw her She had severe diffuse pain She had interrupted sleep She had excessive fatigue She has poor appetite and has lost weight The oxycodone is not helping with her chronic pain She has excessive concern about stopping treatment She has not seen a dermatologist or rheumatologist  SUMMARY OF ONCOLOGIC HISTORY: Oncology History Overview Note  BRAF: undetectable (negative)   Melanoma of vulva ([place])  10/26/2020 Initial Diagnosis   She was noted to have bloody discharge in her underwear.  She denies pain   12/07/2020 Initial Biopsy   FINAL MICROSCOPIC DIAGNOSIS:   A.   RIGHT VULVA, BIOPSY: PRIMARY MALIGNANT MELANOMA OF VULVA, MELANOMA  TABLE BELOW  MALIGNANT MELANOMA (AJCC 8TH EDITION):  PROCEDURE: BIOPSY  SPECIMEN ANATOMIC  SITE: RIGHT VULVA  BRESLOW'S DEPTH/MAXIMUM TUMOR THICKNESS: 5.05 MM  CLARK/ANATOMIC LEVEL* IV  MARGINS:  PERIPHERAL: INVOLVED  DEEP: FREE  ULCERATION: PRESENT  MITOTIC INDEX: 11/mm2  LYMPHO-VASCULAR INVASION: PRESENT*  NEUTROTROPISM: ABSENT  TUMOR-INFILTRATING LYMPHOCYTES: NON-BRISK  LYMPH NODES: N/A  PATHOLOGIC STAGE: PT4B NX MX  *VULVAR MELANOMAS MAY NOT HAVE THE SAME BEHAVIOR STAGE FOR STAGE AS SUPERFICIAL SPREADING  CUTANEOUS MELANOMA, AND LANDMARKS SUCH AS BRESLOW'S DEPTH MAY NOT APPLY. NONETHELESS, THIS SHOULD BE TREATED AS A PT4B MELANOMA AND COMPLETE EXCISION, WITH POSSIBLE LYMPH NODE EVALUATION, UNDERTAKEN. SEE COMMENT FOR IHC PROFILE.    COMMENT:  Sections show confluent and nested growth of atypical melanocytes along vulvar epithelium in an in-situ melanoma growth pattern at the periphery, with a central large ulcerated nodule of similar atypical  melanocytes, consonant with primary vulvar melanoma. Notably, IHC was performed to evaluate the lineage and is diagnostic for melanoma. Lesional melanocytes are positive with S100, MelanA, and show a high ki-67 uptake, and are negative with CDX-2, synaptophysin, CK20, TTF-1, CK7, CD56, and lymphoid markers CD5, CD3, CD20 highlight only background lymphocytes.    12/17/2020 Initial Diagnosis   Melanoma of vulva ([place])   12/17/2020 Cancer Staging   Staging form: Melanoma of the Skin, AJCC 8th Edition - Clinical stage from 12/17/2020: Stage IIC (cT4b, cN0, cM0) - Signed by [name], MD on 12/17/2020  Stage prefix: Initial diagnosis    12/30/2020 PET scan   1. The only potential hypermetabolic finding of note is a small flat focus of accentuated metabolic activity along the anterior vaginal vestibule/perineum, maximum SUV 6.5. Most common cause for this type of appearance would be a trace amount of incontinence of urinary FDG, but given the proximity to the reported vulvar melanoma, this small focus is considered indeterminate. 2. Other imaging findings of potential clinical significance: Aortic Atherosclerosis (ICD10-I70.0). Small type 1 hiatal hernia. Palatine tonsilloliths. Colonic diverticulosis.   01/18/2021 Surgery   Preoperative Diagnosis: Vulvar melanoma  Postoperative Diagnosis: Same  Procedures performed: Wide radical vulvectomy, R inguinofemoral sentinel LN biopsy  Attending Surgeon: [name], MD  Fellow: [name]  Resident:  Melony Overly, MD; Darrin Nipper, MD  Anesthesia: General endotracheal  Findings:  1. Vulva with biopsy site changes at R labia minora adjacent to the clitoris. No visible tumor. 2. Inguinofemoral nodes not clinically enlarged or abnormal.   Specimens:  1. R inguinal sentinel LN, hot and blue 2. Portion of vulva 3. Superior deep margin    01/18/2021 Pathology Results   Only melanoma in situ seen in final resection, SLN negative   02/08/2021 -  Chemotherapy    Patient is on Treatment Plan: MELANOMA PEMBROLIZUMAB Q21D       03/29/2021 Genetic Testing   Negative genetic testing on the Multi-cancer +RNA panel.  The report date is March 29, 2021.  The Multi-Gene Panel offered by [facility] includes sequencing and/or deletion duplication testing of the following 84 genes: AIP, ALK, APC, ATM, AXIN2,BAP1,  BARD1, BLM, BMPR1A, BRCA1, BRCA2, BRIP1, CASR, CDC73, CDH1, CDK4, CDKN1B, CDKN1C, CDKN2A (p14ARF), CDKN2A (p16INK4a), CEBPA, CHEK2, CTNNA1, DICER1, DIS3L2, EGFR (c.2369C>T, p.Thr790Met variant only), EPCAM (Deletion/duplication testing only), FH, FLCN, GATA2, GPC3, GREM1 (Promoter region deletion/duplication testing only), HOXB13 (c.251G>A, p.Gly84Glu), HRAS, KIT, MAX, MEN1, MET, MITF (c.952G>A, p.Glu318Lys variant only), MLH1, MSH2, MSH3, MSH6, MUTYH, NBN, NF1, NF2, NTHL1, PALB2, PDGFRA, PHOX2B, PMS2, POLD1, POLE, POT1, PRKAR1A, PTCH1, PTEN, RAD50, RAD51C, RAD51D, RB1, RECQL4, RET, RUNX1, SDHAF2, SDHA (sequence changes only), SDHB, SDHC, SDHD, SMAD4, SMARCA4, SMARCB1, SMARCE1, STK11,  SUFU, TERC, TERT, TMEM127, TP53, TSC1, TSC2, VHL, WRN and WT1.       REVIEW OF SYSTEMS:   Constitutional: Denies fevers, chills or abnormal weight loss Eyes: Denies blurriness of vision Ears, nose, mouth, throat, and face: Denies mucositis or sore throat Respiratory: Denies cough, dyspnea or wheezes Cardiovascular: Denies palpitation, chest discomfort or lower extremity swelling Gastrointestinal:  Denies  nausea, heartburn or change in bowel habits Skin: Denies abnormal skin rashes Lymphatics: Denies new lymphadenopathy or easy bruising Neurological:Denies numbness, tingling or new weaknesses Behavioral/Psych: Mood is stable, no new changes  All other systems were reviewed with the patient and are negative.  I have reviewed the past medical history, past surgical history, social history and family history with the patient and they are unchanged from previous note.  ALLERGIES:  has No Known Allergies.  MEDICATIONS:  Current Outpatient Medications  Medication Sig Dispense Refill   acetaminophen (ACETAMINOPHEN EXTRA STRENGTH) 500 MG tablet Take 2 tablets by mouth every 6 hours as needed for pain 100 tablet 2   bisacodyl (DULCOLAX) 5 MG EC tablet Take 5 mg by mouth daily as needed for moderate constipation.     hydrochlorothiazide (HYDRODIURIL) 25 MG tablet Take 1 tablet (25 mg total) by mouth daily as needed. 90 tablet 3   ibuprofen (ADVIL) 600 MG tablet Take 1 tablet by mouth every 6 hours as needed for pain (Patient taking differently: 400 mg.) 60 tablet 0   ondansetron (ZOFRAN) 8 MG tablet Take 1 tablet (8 mg total) by mouth 2 times daily as needed for nausea or vomiting. 30 tablet 1   oxyCODONE (OXY IR/ROXICODONE) 5 MG immediate release tablet Take 1 tablet (5 mg total) by mouth every 4 (four) hours as needed for pain 90 tablet 0   polyethylene glycol (MIRALAX / GLYCOLAX) 17 g packet Take 17 g by mouth daily as needed.     prochlorperazine (COMPAZINE) 10 MG tablet Take 1 tablet (10 mg total) by mouth every 6 (six) hours as needed for nausea or vomiting. (Patient not taking: Reported on 03/07/2021) 30 tablet 1   vitamin B-12 (CYANOCOBALAMIN) 1000 MCG tablet Take by mouth every other day.     VITAMIN D, CHOLECALCIFEROL, PO Take 2,000 Int'l Units/1.22m by mouth.     No current facility-administered medications for this visit.    PHYSICAL EXAMINATION: ECOG PERFORMANCE STATUS: 2 - Symptomatic,  <50% confined to bed  Vitals:   04/12/21 0802  BP: (!) 147/72  Pulse: 77  Resp: 18  Temp: (!) 97.5 F (36.4 C)  SpO2: 100%   Filed Weights   04/12/21 0802  Weight: 187 lb 9.6 oz (85.1 kg)    GENERAL:alert, no distress and comfortable NEURO: alert & oriented x 3 with fluent speech, no focal motor/sensory deficits  LABORATORY DATA:  I have reviewed the data as listed    Component Value Date/Time   NA 142 04/12/2021 0742   K 2.7 (LL) 04/12/2021 0742   CL 99 04/12/2021 0742   CO2 28 04/12/2021 0742   GLUCOSE 119 (H) 04/12/2021 0742   BUN 19 04/12/2021 0742   CREATININE 0.69 04/12/2021 0742   CREATININE 0.82 08/23/2020 1048   CALCIUM 9.7 04/12/2021 0742   PROT 7.6 04/12/2021 0742   ALBUMIN 3.3 (L) 04/12/2021 0742   AST 9 (L) 04/12/2021 0742   ALT 8 04/12/2021 0742   ALKPHOS 57 04/12/2021 0742   BILITOT 0.5 04/12/2021 0742   GFRNONAA >60 04/12/2021 0742   GFRNONAA 77 08/23/2020 1048   GFRAA  89 08/23/2020 1048    No results found for: SPEP, UPEP  Lab Results  Component Value Date   WBC 7.8 04/12/2021   NEUTROABS 5.6 04/12/2021   HGB 9.0 (L) 04/12/2021   HCT 28.5 (L) 04/12/2021   MCV 87.2 04/12/2021   PLT 443 (H) 04/12/2021      Chemistry      Component Value Date/Time   NA 142 04/12/2021 0742   K 2.7 (LL) 04/12/2021 0742   CL 99 04/12/2021 0742   CO2 28 04/12/2021 0742   BUN 19 04/12/2021 0742   CREATININE 0.69 04/12/2021 0742   CREATININE 0.82 08/23/2020 1048      Component Value Date/Time   CALCIUM 9.7 04/12/2021 0742   ALKPHOS 57 04/12/2021 0742   AST 9 (L) 04/12/2021 0742   ALT 8 04/12/2021 0742   BILITOT 0.5 04/12/2021 0742       RADIOGRAPHIC STUDIES: I have personally reviewed the radiological images as listed and agreed with the findings in the report. VAS [place] LOWER EXTREMITY VENOUS (DVT)  Result Date: 03/22/2021  Lower Venous DVT Study Patient Name:  Ariel Rios  Date of Exam:   03/22/2021 Medical Rec #: [id number]          Accession  #:    [id number] Date of Birth: 1956/08/25         Patient Gender: F Patient Age:   64Y Exam Location:  [facility] Procedure:      VAS [place] LOWER EXTREMITY VENOUS (DVT) Referring Phys: [id number] Auria Mckinlay [facility] --------------------------------------------------------------------------------  Indications: Swelling.  Risk Factors: Cancer. Limitations: Poor ultrasound/tissue interface and body habitus. Comparison Study: No prior studies. Performing Technologist: Oliver Hum RVT  Examination Guidelines: A complete evaluation includes B-mode imaging, spectral Doppler, color Doppler, and power Doppler as needed of all accessible portions of each vessel. Bilateral testing is considered an integral part of a complete examination. Limited examinations for reoccurring indications may be performed as noted. The reflux portion of the exam is performed with the patient in reverse Trendelenburg.  +---------+---------------+---------+-----------+----------+--------------+ RIGHT    CompressibilityPhasicitySpontaneityPropertiesThrombus Aging +---------+---------------+---------+-----------+----------+--------------+ CFV      Full           Yes      Yes                                 +---------+---------------+---------+-----------+----------+--------------+ SFJ      Full                                                        +---------+---------------+---------+-----------+----------+--------------+ FV Prox  Full                                                        +---------+---------------+---------+-----------+----------+--------------+ FV Mid   Full                                                        +---------+---------------+---------+-----------+----------+--------------+ FV  DistalFull                                                        +---------+---------------+---------+-----------+----------+--------------+ PFV      Full                                                         +---------+---------------+---------+-----------+----------+--------------+ POP      Full           Yes      Yes                                 +---------+---------------+---------+-----------+----------+--------------+ PTV      Full                                                        +---------+---------------+---------+-----------+----------+--------------+ PERO     Full                                                        +---------+---------------+---------+-----------+----------+--------------+   +---------+---------------+---------+-----------+----------+--------------+ LEFT     CompressibilityPhasicitySpontaneityPropertiesThrombus Aging +---------+---------------+---------+-----------+----------+--------------+ CFV      Full           Yes      Yes                                 +---------+---------------+---------+-----------+----------+--------------+ SFJ      Full                                                        +---------+---------------+---------+-----------+----------+--------------+ FV Prox  Full                                                        +---------+---------------+---------+-----------+----------+--------------+ FV Mid   Full                                                        +---------+---------------+---------+-----------+----------+--------------+ FV DistalFull                                                        +---------+---------------+---------+-----------+----------+--------------+  PFV      Full                                                        +---------+---------------+---------+-----------+----------+--------------+ POP      Full           Yes      Yes                                 +---------+---------------+---------+-----------+----------+--------------+ PTV      Full                                                         +---------+---------------+---------+-----------+----------+--------------+ PERO     Full                                                        +---------+---------------+---------+-----------+----------+--------------+     Summary: RIGHT: - There is no evidence of deep vein thrombosis in the lower extremity.  - No cystic structure found in the popliteal fossa.  LEFT: - There is no evidence of deep vein thrombosis in the lower extremity.  - No cystic structure found in the popliteal fossa.  *See table(s) above for measurements and observations. Electronically signed by Deitra Mayo MD on 03/22/2021 at 2:55:39 PM.    Final

## 2021-04-12 NOTE — Assessment & Plan Note (Addendum)
She has unbearable side effects with severe pain She will resume prednisone I recommend follow-up with her primary care doctor She could benefit from rheumatology consult

## 2021-04-12 NOTE — Assessment & Plan Note (Addendum)
Unfortunately, she have developed unbearable joint pain and severe arthralgia and fatigue She has virtually no quality of life while on treatment At this point in time, I recommend discontinuation of treatment We have extensive discussions about the risks, benefits, alternative approach to this situation I will order a PET CT scan to assess response to treatment If her PET CT scan is completely normal, I will send her back to her surgeon for future follow-up In the meantime, I will arrange for dermatology consult

## 2021-04-12 NOTE — Telephone Encounter (Signed)
Faxed referral to [phone], received confirmation.

## 2021-04-12 NOTE — Assessment & Plan Note (Signed)
Could be related to side effects of hydrochlorothiazide I advised her to stop for few days and to start potassium rich diet I am hopeful with resumption of prednisone, her potassium will improve I plan to recheck it again next week

## 2021-04-12 NOTE — Telephone Encounter (Signed)
Called per Dr. Alvy Bimler, given potassium results of 2.7. Potassium low likely due to her HCTZ and steroid withdrawal. Hold HCTZ for 3 days, focus on potassium rich diet. Her prednisone will help her regulate her potassium. Reviewed potassium rich diet. She verbalized understanding and will call the office back once she schedules PET scan and lab appt will be added that day.

## 2021-04-12 NOTE — Telephone Encounter (Signed)
-----   Message from [name], MD sent at 04/12/2021  3:51 PM EDT ----- I just finished my notes Can you refer her to Dr. Ronnald Ramp dermatology?

## 2021-04-12 NOTE — Telephone Encounter (Signed)
CRITICAL VALUE STICKER  CRITICAL VALUE: K = 2.7  RECEIVER (on-site recipient of call): Yetta Glassman, CMA  DATE & TIME NOTIFIED: 04/12/21 at 8:37am  MESSENGER (representative from lab): Rosann Auerbach  MD NOTIFIED: Dr. Alvy Bimler  TIME OF NOTIFICATION: 04/12/21 at 8:45am  RESPONSE: Notification given to Hassan Rowan RN for follow-up with provider and pt.

## 2021-04-12 NOTE — Assessment & Plan Note (Signed)
This is due to side effects of treatment and flare from polymyalgia rheumatica Observe closely for now I am hopeful with resumption of prednisone, her anemia will improve

## 2021-04-13 LAB — T4: T4, Total: 8.6 ug/dL (ref 4.5–12.0)

## 2021-04-26 ENCOUNTER — Inpatient Hospital Stay: Payer: No Typology Code available for payment source | Attending: Gynecologic Oncology

## 2021-04-26 ENCOUNTER — Other Ambulatory Visit: Payer: Self-pay

## 2021-04-26 DIAGNOSIS — C519 Malignant neoplasm of vulva, unspecified: Secondary | ICD-10-CM | POA: Diagnosis not present

## 2021-04-26 DIAGNOSIS — M353 Polymyalgia rheumatica: Secondary | ICD-10-CM | POA: Insufficient documentation

## 2021-04-26 LAB — CBC WITH DIFFERENTIAL (CANCER CENTER ONLY)
Abs Immature Granulocytes: 0.03 10*3/uL (ref 0.00–0.07)
Basophils Absolute: 0 10*3/uL (ref 0.0–0.1)
Basophils Relative: 0 %
Eosinophils Absolute: 0 10*3/uL (ref 0.0–0.5)
Eosinophils Relative: 1 %
HCT: 34.3 % — ABNORMAL LOW (ref 36.0–46.0)
Hemoglobin: 10.7 g/dL — ABNORMAL LOW (ref 12.0–15.0)
Immature Granulocytes: 0 %
Lymphocytes Relative: 14 %
Lymphs Abs: 1.1 10*3/uL (ref 0.7–4.0)
MCH: 27.9 pg (ref 26.0–34.0)
MCHC: 31.2 g/dL (ref 30.0–36.0)
MCV: 89.3 fL (ref 80.0–100.0)
Monocytes Absolute: 0.3 10*3/uL (ref 0.1–1.0)
Monocytes Relative: 3 %
Neutro Abs: 6.8 10*3/uL (ref 1.7–7.7)
Neutrophils Relative %: 82 %
Platelet Count: 443 10*3/uL — ABNORMAL HIGH (ref 150–400)
RBC: 3.84 MIL/uL — ABNORMAL LOW (ref 3.87–5.11)
RDW: 16.2 % — ABNORMAL HIGH (ref 11.5–15.5)
WBC Count: 8.2 10*3/uL (ref 4.0–10.5)
nRBC: 0 % (ref 0.0–0.2)

## 2021-04-26 LAB — CMP (CANCER CENTER ONLY)
ALT: 20 U/L (ref 0–44)
AST: 11 U/L — ABNORMAL LOW (ref 15–41)
Albumin: 4.1 g/dL (ref 3.5–5.0)
Alkaline Phosphatase: 61 U/L (ref 38–126)
Anion gap: 11 (ref 5–15)
BUN: 19 mg/dL (ref 8–23)
CO2: 26 mmol/L (ref 22–32)
Calcium: 10 mg/dL (ref 8.9–10.3)
Chloride: 103 mmol/L (ref 98–111)
Creatinine: 0.67 mg/dL (ref 0.44–1.00)
GFR, Estimated: >60 mL/min (ref >60)
Glucose, Bld: 103 mg/dL — ABNORMAL HIGH (ref 70–99)
Potassium: 3.4 mmol/L — ABNORMAL LOW (ref 3.5–5.1)
Sodium: 140 mmol/L (ref 135–145)
Total Bilirubin: 1.1 mg/dL (ref 0.3–1.2)
Total Protein: 7.6 g/dL (ref 6.5–8.1)

## 2021-04-27 LAB — TSH: TSH: 1.993 u[IU]/mL (ref 0.308–3.960)

## 2021-04-27 LAB — T4: T4, Total: 8.9 ug/dL (ref 4.5–12.0)

## 2021-04-29 ENCOUNTER — Encounter (HOSPITAL_COMMUNITY)
Admission: RE | Admit: 2021-04-29 | Discharge: 2021-04-29 | Disposition: A | Discharge status: Home / Self Care | Payer: No Typology Code available for payment source | Source: Ambulatory Visit | Attending: Hematology and Oncology | Admitting: Hematology and Oncology

## 2021-04-29 ENCOUNTER — Other Ambulatory Visit: Payer: Self-pay

## 2021-04-29 DIAGNOSIS — C519 Malignant neoplasm of vulva, unspecified: Secondary | ICD-10-CM | POA: Diagnosis not present

## 2021-04-29 DIAGNOSIS — Z808 Family history of malignant neoplasm of other organs or systems: Secondary | ICD-10-CM | POA: Insufficient documentation

## 2021-04-29 LAB — GLUCOSE, CAPILLARY: Glucose-Capillary: 90 mg/dL (ref 70–99)

## 2021-04-29 MED ORDER — FLUDEOXYGLUCOSE F - 18 (FDG) INJECTION
9.0000 | Freq: Once | INTRAVENOUS | Status: AC | PRN
  Administered 2021-04-29: 9 via INTRAVENOUS

## 2021-05-02 ENCOUNTER — Other Ambulatory Visit: Payer: Self-pay

## 2021-05-02 ENCOUNTER — Encounter: Payer: Self-pay | Admitting: Hematology and Oncology

## 2021-05-02 ENCOUNTER — Inpatient Hospital Stay (HOSPITAL_BASED_OUTPATIENT_CLINIC_OR_DEPARTMENT_OTHER): Payer: No Typology Code available for payment source | Admitting: Hematology and Oncology

## 2021-05-02 DIAGNOSIS — C519 Malignant neoplasm of vulva, unspecified: Secondary | ICD-10-CM | POA: Diagnosis not present

## 2021-05-02 DIAGNOSIS — M353 Polymyalgia rheumatica: Secondary | ICD-10-CM

## 2021-05-02 MED ORDER — PREDNISONE 20 MG PO TABS: 20.0000 mg | ORAL_TABLET | Freq: Every day | ORAL | 6 refills | Status: DC

## 2021-05-02 NOTE — Assessment & Plan Note (Signed)
Her polymyalgia rheumatica has improved dramatically since discontinuation of treatment and resumption of prednisone I refilled her prescription prednisone today I recommend long-term follow-up with her primary care doctor and rheumatologist

## 2021-05-02 NOTE — Progress Notes (Signed)
[place] OFFICE PROGRESS NOTE  Patient Care Team: Rios, Ariel G, MD as PCP - General (Family Medicine)  ASSESSMENT & PLAN:  Melanoma of vulva [facility]) I reviewed PET CT imaging with the patient and her husband She has no signs of disease I have placed dermatology consult for her to follow-up with dermatologist long-term She will continue pelvic exam and evaluation with Dr. Berline Lopes She does not need to see me for long-term follow-up We discussed importance of aggressive preventive measures against risk of melanoma recurrence  PMR (polymyalgia rheumatica) (HCC) Her polymyalgia rheumatica has improved dramatically since discontinuation of treatment and resumption of prednisone I refilled her prescription prednisone today I recommend long-term follow-up with her primary care doctor and rheumatologist  No orders of the defined types were placed in this encounter.   All questions were answered. The patient knows to call the clinic with any problems, questions or concerns. The total time spent in the appointment was 20 minutes encounter with patients including review of chart and various tests results, discussions about plan of care and coordination of care plan   [name], MD 05/02/2021 5:00 PM  INTERVAL HISTORY: Please see below for problem oriented charting. She returns with her husband to review test results Since last time I saw her, she was able to discontinue all pain medicine Her energy level, appetite and joint pain are much better, almost back to baseline She is still waiting for dermatology consult  SUMMARY OF ONCOLOGIC HISTORY: Oncology History Overview Note  BRAF: undetectable (negative)   Melanoma of vulva (Ariel Rios)  10/26/2020 Initial Diagnosis   She was noted to have bloody discharge in her underwear.  She denies pain   12/07/2020 Initial Biopsy   FINAL MICROSCOPIC DIAGNOSIS:   A.   RIGHT VULVA, BIOPSY: PRIMARY MALIGNANT MELANOMA OF VULVA, MELANOMA   TABLE BELOW  MALIGNANT MELANOMA (AJCC 8TH EDITION):  PROCEDURE: BIOPSY  SPECIMEN ANATOMIC SITE: RIGHT VULVA  BRESLOW'S DEPTH/MAXIMUM TUMOR THICKNESS: 5.05 MM  CLARK/ANATOMIC LEVEL* IV  MARGINS:  PERIPHERAL: INVOLVED  DEEP: FREE  ULCERATION: PRESENT  MITOTIC INDEX: 11/mm2  LYMPHO-VASCULAR INVASION: PRESENT*  NEUTROTROPISM: ABSENT  TUMOR-INFILTRATING LYMPHOCYTES: NON-BRISK  LYMPH NODES: N/A  PATHOLOGIC STAGE: PT4B NX MX  *VULVAR MELANOMAS MAY NOT HAVE THE SAME BEHAVIOR STAGE FOR STAGE AS SUPERFICIAL SPREADING CUTANEOUS MELANOMA, AND LANDMARKS SUCH AS BRESLOW'S DEPTH MAY NOT APPLY. NONETHELESS, THIS SHOULD BE TREATED AS A PT4B MELANOMA AND COMPLETE EXCISION, WITH POSSIBLE LYMPH NODE EVALUATION, UNDERTAKEN. SEE COMMENT FOR IHC PROFILE.    COMMENT:  Sections show confluent and nested growth of atypical melanocytes along vulvar epithelium in an in-situ melanoma growth pattern at the periphery, with a central large ulcerated nodule of similar atypical  melanocytes, consonant with primary vulvar melanoma. Notably, IHC was performed to evaluate the lineage and is diagnostic for melanoma. Lesional melanocytes are positive with S100, MelanA, and show a high ki-67 uptake, and are negative with CDX-2, synaptophysin, CK20, TTF-1, CK7, CD56, and lymphoid markers CD5, CD3, CD20 highlight only background lymphocytes.    12/17/2020 Initial Diagnosis   Melanoma of vulva (Ariel Rios)   12/17/2020 Cancer Staging   Staging form: Melanoma of the Skin, AJCC 8th Edition - Clinical stage from 12/17/2020: Stage IIC (cT4b, cN0, cM0) - Signed by [name], MD on 12/17/2020  Stage prefix: Initial diagnosis    12/30/2020 PET scan   1. The only potential hypermetabolic finding of note is a small flat focus of accentuated metabolic activity along the anterior vaginal vestibule/perineum, maximum SUV  6.5. Most common cause for this type of appearance would be a trace amount of incontinence of urinary FDG, but given the  proximity to the reported vulvar melanoma, this small focus is considered indeterminate. 2. Other imaging findings of potential clinical significance: Aortic Atherosclerosis (ICD10-I70.0). Small type 1 hiatal hernia. Palatine tonsilloliths. Colonic diverticulosis.   01/18/2021 Surgery   Preoperative Diagnosis: Vulvar melanoma  Postoperative Diagnosis: Same  Procedures performed: Wide radical vulvectomy, R inguinofemoral sentinel LN biopsy  Attending Surgeon: [name], MD  Fellow: [name]  Resident: Melony Overly, MD; Darrin Nipper, MD  Anesthesia: General endotracheal  Findings:  1. Vulva with biopsy site changes at R labia minora adjacent to the clitoris. No visible tumor. 2. Inguinofemoral nodes not clinically enlarged or abnormal.   Specimens:  1. R inguinal sentinel LN, hot and blue 2. Portion of vulva 3. Superior deep margin    01/18/2021 Pathology Results   Only melanoma in situ seen in final resection, SLN negative   02/08/2021 - 03/22/2021 Chemotherapy          03/29/2021 Genetic Testing   Negative genetic testing on the Multi-cancer +RNA panel.  The report date is March 29, 2021.  The Multi-Gene Panel offered by [facility] includes sequencing and/or deletion duplication testing of the following 84 genes: AIP, ALK, APC, ATM, AXIN2,BAP1,  BARD1, BLM, BMPR1A, BRCA1, BRCA2, BRIP1, CASR, CDC73, CDH1, CDK4, CDKN1B, CDKN1C, CDKN2A (p14ARF), CDKN2A (p16INK4a), CEBPA, CHEK2, CTNNA1, DICER1, DIS3L2, EGFR (c.2369C>T, p.Thr790Met variant only), EPCAM (Deletion/duplication testing only), FH, FLCN, GATA2, GPC3, GREM1 (Promoter region deletion/duplication testing only), HOXB13 (c.251G>A, p.Gly84Glu), HRAS, KIT, MAX, MEN1, MET, MITF (c.952G>A, p.Glu318Lys variant only), MLH1, MSH2, MSH3, MSH6, MUTYH, NBN, NF1, NF2, NTHL1, PALB2, PDGFRA, PHOX2B, PMS2, POLD1, POLE, POT1, PRKAR1A, PTCH1, PTEN, RAD50, RAD51C, RAD51D, RB1, RECQL4, RET, RUNX1, SDHAF2, SDHA (sequence changes  only), SDHB, SDHC, SDHD, SMAD4, SMARCA4, SMARCB1, SMARCE1, STK11, SUFU, TERC, TERT, TMEM127, TP53, TSC1, TSC2, VHL, WRN and WT1.     04/29/2021 PET scan   1. No vulvar mass or residual hypermetabolism. 2. No findings suspicious for metastatic disease.     REVIEW OF SYSTEMS:   Constitutional: Denies fevers, chills or abnormal weight loss Eyes: Denies blurriness of vision Ears, nose, mouth, throat, and face: Denies mucositis or sore throat Respiratory: Denies cough, dyspnea or wheezes Cardiovascular: Denies palpitation, chest discomfort or lower extremity swelling Gastrointestinal:  Denies nausea, heartburn or change in bowel habits Skin: Denies abnormal skin rashes Lymphatics: Denies new lymphadenopathy or easy bruising Neurological:Denies numbness, tingling or new weaknesses Behavioral/Psych: Mood is stable, no new changes  All other systems were reviewed with the patient and are negative.  I have reviewed the past medical history, past surgical history, social history and family history with the patient and they are unchanged from previous note.  ALLERGIES:  has No Known Allergies.  MEDICATIONS:  Current Outpatient Medications  Medication Sig Dispense Refill   predniSONE (DELTASONE) 20 MG tablet Take 1 tablet (20 mg total) by mouth daily with breakfast. 30 tablet 6   bisacodyl (DULCOLAX) 5 MG EC tablet Take 5 mg by mouth daily as needed for moderate constipation.     hydrochlorothiazide (HYDRODIURIL) 25 MG tablet Take 1 tablet (25 mg total) by mouth daily as needed. 90 tablet 3   polyethylene glycol (MIRALAX / GLYCOLAX) 17 g packet Take 17 g by mouth daily as needed.     vitamin B-12 (CYANOCOBALAMIN) 1000 MCG tablet Take by mouth every other day.     VITAMIN D, CHOLECALCIFEROL, PO  Take 2,000 Int'l Units/1.49m by mouth.     No current facility-administered medications for this visit.    PHYSICAL EXAMINATION: ECOG PERFORMANCE STATUS: 1 - Symptomatic but completely  ambulatory  Vitals:   05/02/21 1206  BP: 129/63  Pulse: 90  Resp: 18  Temp: (!) 97.4 F (36.3 C)  SpO2: 98%   Filed Weights   05/02/21 1206  Weight: 177 lb 8 oz (80.5 kg)    GENERAL:alert, no distress and comfortable  NEURO: alert & oriented x 3 with fluent speech, no focal motor/sensory deficits  LABORATORY DATA:  I have reviewed the data as listed    Component Value Date/Time   NA 140 04/26/2021 0803   K 3.4 (L) 04/26/2021 0803   CL 103 04/26/2021 0803   CO2 26 04/26/2021 0803   GLUCOSE 103 (H) 04/26/2021 0803   BUN 19 04/26/2021 0803   CREATININE 0.67 04/26/2021 0803   CREATININE 0.82 08/23/2020 1048   CALCIUM 10.0 04/26/2021 0803   PROT 7.6 04/26/2021 0803   ALBUMIN 4.1 04/26/2021 0803   AST 11 (L) 04/26/2021 0803   ALT 20 04/26/2021 0803   ALKPHOS 61 04/26/2021 0803   BILITOT 1.1 04/26/2021 0803   GFRNONAA >60 04/26/2021 0803   GFRNONAA 77 08/23/2020 1048   GFRAA 89 08/23/2020 1048    No results found for: SPEP, UPEP  Lab Results  Component Value Date   WBC 8.2 04/26/2021   NEUTROABS 6.8 04/26/2021   HGB 10.7 (L) 04/26/2021   HCT 34.3 (L) 04/26/2021   MCV 89.3 04/26/2021   PLT 443 (H) 04/26/2021      Chemistry      Component Value Date/Time   NA 140 04/26/2021 0803   K 3.4 (L) 04/26/2021 0803   CL 103 04/26/2021 0803   CO2 26 04/26/2021 0803   BUN 19 04/26/2021 0803   CREATININE 0.67 04/26/2021 0803   CREATININE 0.82 08/23/2020 1048      Component Value Date/Time   CALCIUM 10.0 04/26/2021 0803   ALKPHOS 61 04/26/2021 0803   AST 11 (L) 04/26/2021 0803   ALT 20 04/26/2021 0803   BILITOT 1.1 04/26/2021 0803       RADIOGRAPHIC STUDIES: I have reviewed her imaging study with her husband I have personally reviewed the radiological images as listed and agreed with the findings in the report. NM PET Image Restage (PS) Whole Body  Result Date: 05/01/2021 CLINICAL DATA:  Subsequent treatment strategy for vulvar melanoma. EXAM: NUCLEAR  MEDICINE PET WHOLE BODY TECHNIQUE: 9.2 mCi F-18 FDG was injected intravenously. Full-ring PET imaging was performed from the head to foot after the radiotracer. CT data was obtained and used for attenuation correction and anatomic localization. Fasting blood glucose: 90 mg/dl COMPARISON:  PET-CT 12/30/2020 FINDINGS: Mediastinal blood pool activity: SUV max 2.22 HEAD/NECK: No hypermetabolic activity in the scalp. No hypermetabolic cervical lymph nodes. Incidental CT findings: Stable scattered calcifications in the tonsillar regions bilaterally likely due to prior inflammation or infection. CHEST: No hypermetabolic mediastinal or hilar lymph nodes. No worrisome pulmonary nodules. No breast masses, supraclavicular or axillary adenopathy. Incidental CT findings: Stable vascular calcifications. ABDOMEN/PELVIS: No abnormal hypermetabolic activity within the liver, pancreas, adrenal glands, or spleen. No hypermetabolic lymph nodes in the abdomen or pelvis. No obvious vulvar lesion or residual hypermetabolism. Incidental CT findings: Stable hepatic cysts. Stable left-sided parapelvic renal cysts. SKELETON: No findings suspicious for osseous metastatic disease. Incidental CT findings: Mild hypermetabolism both shoulders likely inflammatory disease. EXTREMITIES: No abnormal hypermetabolic activity in the lower  extremities. Incidental CT findings: none IMPRESSION: 1. No vulvar mass or residual hypermetabolism. 2. No findings suspicious for metastatic disease. Electronically Signed   By: Marijo Sanes M.D.   On: 05/01/2021 15:11

## 2021-05-02 NOTE — Assessment & Plan Note (Signed)
I reviewed PET CT imaging with the patient and her husband She has no signs of disease I have placed dermatology consult for her to follow-up with dermatologist long-term She will continue pelvic exam and evaluation with Dr. Berline Lopes She does not need to see me for long-term follow-up We discussed importance of aggressive preventive measures against risk of melanoma recurrence

## 2021-05-03 ENCOUNTER — Ambulatory Visit: Payer: No Typology Code available for payment source

## 2021-05-03 ENCOUNTER — Other Ambulatory Visit: Payer: No Typology Code available for payment source

## 2021-05-03 ENCOUNTER — Ambulatory Visit: Payer: No Typology Code available for payment source | Admitting: Hematology and Oncology

## 2021-05-25 ENCOUNTER — Other Ambulatory Visit: Payer: Self-pay | Admitting: Family Medicine

## 2021-05-25 DIAGNOSIS — R600 Localized edema: Secondary | ICD-10-CM

## 2021-06-09 NOTE — Progress Notes (Signed)
Gynecologic Oncology Return Clinic Visit  06/10/21  Reason for Visit: follow-up after treatment for vulvar melanoma  Treatment History: Oncology History Overview Note  BRAF: undetectable (negative)   Melanoma of vulva ([name])  10/26/2020 Initial Diagnosis   She was noted to have bloody discharge in her underwear.  She denies pain   12/07/2020 Initial Biopsy   FINAL MICROSCOPIC DIAGNOSIS:   A.   RIGHT VULVA, BIOPSY: PRIMARY MALIGNANT MELANOMA OF VULVA, MELANOMA  TABLE BELOW  MALIGNANT MELANOMA (AJCC 8TH EDITION):  PROCEDURE: BIOPSY  SPECIMEN ANATOMIC SITE: RIGHT VULVA  BRESLOW'S DEPTH/MAXIMUM TUMOR THICKNESS: 5.05 MM  CLARK/ANATOMIC LEVEL* IV  MARGINS:  PERIPHERAL: INVOLVED  DEEP: FREE  ULCERATION: PRESENT  MITOTIC INDEX: 11/mm2  LYMPHO-VASCULAR INVASION: PRESENT*  NEUTROTROPISM: ABSENT  TUMOR-INFILTRATING LYMPHOCYTES: NON-BRISK  LYMPH NODES: N/A  PATHOLOGIC STAGE: PT4B NX MX  *VULVAR MELANOMAS MAY NOT HAVE THE SAME BEHAVIOR STAGE FOR STAGE AS SUPERFICIAL SPREADING CUTANEOUS MELANOMA, AND LANDMARKS SUCH AS BRESLOW'S DEPTH MAY NOT APPLY. NONETHELESS, THIS SHOULD BE TREATED AS A PT4B MELANOMA AND COMPLETE EXCISION, WITH POSSIBLE LYMPH NODE EVALUATION, UNDERTAKEN. SEE COMMENT FOR IHC PROFILE.    COMMENT:  Sections show confluent and nested growth of atypical melanocytes along vulvar epithelium in an in-situ melanoma growth pattern at the periphery, with a central large ulcerated nodule of similar atypical  melanocytes, consonant with primary vulvar melanoma. Notably, IHC was performed to evaluate the lineage and is diagnostic for melanoma. Lesional melanocytes are positive with S100, MelanA, and show a high ki-67 uptake, and are negative with CDX-2, synaptophysin, CK20, TTF-1, CK7, CD56, and lymphoid markers CD5, CD3, CD20 highlight only background lymphocytes.    12/17/2020 Initial Diagnosis   Melanoma of vulva ([name])   12/17/2020 Cancer Staging   Staging form: Melanoma of the Skin,  AJCC 8th Edition - Clinical stage from 12/17/2020: Stage IIC (cT4b, cN0, cM0) - Signed by [name], MD on 12/17/2020 Stage prefix: Initial diagnosis   12/30/2020 PET scan   1. The only potential hypermetabolic finding of note is a small flat focus of accentuated metabolic activity along the anterior vaginal vestibule/perineum, maximum SUV 6.5. Most common cause for this type of appearance would be a trace amount of incontinence of urinary FDG, but given the proximity to the reported vulvar melanoma, this small focus is considered indeterminate. 2. Other imaging findings of potential clinical significance: Aortic Atherosclerosis (ICD10-I70.0). Small type 1 hiatal hernia. Palatine tonsilloliths. Colonic diverticulosis.   01/18/2021 Surgery   Preoperative Diagnosis: Vulvar melanoma  Postoperative Diagnosis: Same  Procedures performed: Wide radical vulvectomy, R inguinofemoral sentinel LN biopsy  Attending Surgeon: [name], MD  Fellow: [name]  Resident: Melony Overly, MD; Darrin Nipper, MD  Anesthesia: General endotracheal  Findings:  1. Vulva with biopsy site changes at R labia minora adjacent to the clitoris. No visible tumor. 2. Inguinofemoral nodes not clinically enlarged or abnormal.   Specimens:  1. R inguinal sentinel LN, hot and blue 2. Portion of vulva 3. Superior deep margin    01/18/2021 Pathology Results   Only melanoma in situ seen in final resection, SLN negative   02/08/2021 - 03/22/2021 Chemotherapy          03/29/2021 Genetic Testing   Negative genetic testing on the Multi-cancer +RNA panel.  The report date is March 29, 2021.  The Multi-Gene Panel offered by [facility] includes sequencing and/or deletion duplication testing of the following 84 genes: AIP, ALK, APC, ATM, AXIN2,BAP1,  BARD1, BLM, BMPR1A, BRCA1, BRCA2, BRIP1, CASR, CDC73, CDH1, CDK4,  CDKN1B, CDKN1C, CDKN2A (p14ARF), CDKN2A (p16INK4a), CEBPA, CHEK2, CTNNA1, DICER1, DIS3L2, EGFR  (c.2369C>T, p.Thr790Met variant only), EPCAM (Deletion/duplication testing only), FH, FLCN, GATA2, GPC3, GREM1 (Promoter region deletion/duplication testing only), HOXB13 (c.251G>A, p.Gly84Glu), HRAS, KIT, MAX, MEN1, MET, MITF (c.952G>A, p.Glu318Lys variant only), MLH1, MSH2, MSH3, MSH6, MUTYH, NBN, NF1, NF2, NTHL1, PALB2, PDGFRA, PHOX2B, PMS2, POLD1, POLE, POT1, PRKAR1A, PTCH1, PTEN, RAD50, RAD51C, RAD51D, RB1, RECQL4, RET, RUNX1, SDHAF2, SDHA (sequence changes only), SDHB, SDHC, SDHD, SMAD4, SMARCA4, SMARCB1, SMARCE1, STK11, SUFU, TERC, TERT, TMEM127, TP53, TSC1, TSC2, VHL, WRN and WT1.     04/29/2021 PET scan   1. No vulvar mass or residual hypermetabolism. 2. No findings suspicious for metastatic disease.     Interval History: Patient presents today for follow-up of vulvar melanoma.  She had surgery in late April followed by adjuvant immunotherapy with pembrolizumab.  She initially did well on treatment but then had significant flare of her polymyalgia rheumatica with pain, no strength, and significant deconditioning.  This may have been related to immunotherapy, being off of her prednisone, or both.  Since her last visit with me, she reports doing very well.  She denies any vulvar pain, pruritus, bleeding, discharge.  She endorses a good appetite without nausea or emesis.  She has had significant improvement in her strength and denies any joint pain.  She reports regular bowel and bladder function.  PET on 8/5 showed no metastatic disease.  Past Medical/Surgical History: Past Medical History:  Diagnosis Date   BMI 34.0-34.9,adult    Family history of melanoma    Melanotic macule of vulvar labia    Migraine    Polymyalgia rheumatica ([place])    Venous insufficiency     History reviewed. No pertinent surgical history.  Family History  Problem Relation Age of Onset   Diabetes Mother    AVM Father    Melanoma Father        multiple melanomas dx between 16-90   Cancer Father 37        tumor on kidney   Cancer Paternal Grandfather        laryngeal cancer   Cancer Daughter 57       appendiceal cancer   Breast cancer Neg Hx    Ovarian cancer Neg Hx    Colon cancer Neg Hx    Uterine cancer Neg Hx     Social History   Socioeconomic History   Marital status: Married    Spouse name: Not on file   Number of children: 2   Years of education: Not on file   Highest education level: Not on file  Occupational History   Occupation: shelf clerk   Occupation: stocking  Tobacco Use   Smoking status: Never   Smokeless tobacco: Never  Vaping Use   Vaping Use: Never used  Substance and Sexual Activity   Alcohol use: Yes    Comment: occasional   Drug use: No   Sexual activity: Not Currently  Other Topics Concern   Not on file  Social History Narrative   Not on file   Social Determinants of Health   Financial Resource Strain: Not on file  Food Insecurity: Not on file  Transportation Needs: Not on file  Physical Activity: Not on file  Stress: Not on file  Social Connections: Not on file    Current Medications:  Current Outpatient Medications:    hydrochlorothiazide (HYDRODIURIL) 25 MG tablet, TAKE 1 TABLET (25 MG TOTAL) BY MOUTH DAILY AS NEEDED., Disp: 90 tablet, Rfl: 3  predniSONE (DELTASONE) 20 MG tablet, Take 1 tablet (20 mg total) by mouth daily with breakfast., Disp: 30 tablet, Rfl: 6   vitamin B-12 (CYANOCOBALAMIN) 1000 MCG tablet, Take by mouth every other day., Disp: , Rfl:    VITAMIN D, CHOLECALCIFEROL, PO, Take 2,000 Int'l Units/1.82m2 by mouth., Disp: , Rfl:    bisacodyl (DULCOLAX) 5 MG EC tablet, Take 5 mg by mouth daily as needed for moderate constipation. (Patient not taking: Reported on 06/10/2021), Disp: , Rfl:    polyethylene glycol (MIRALAX / GLYCOLAX) 17 g packet, Take 17 g by mouth daily as needed. (Patient not taking: Reported on 06/10/2021), Disp: , Rfl:   Review of Systems: Denies appetite changes, fevers, chills, fatigue, unexplained weight  changes. Denies hearing loss, neck lumps or masses, mouth sores, ringing in ears or voice changes. Denies cough or wheezing.  Denies shortness of breath. Denies chest pain or palpitations. Denies leg swelling. Denies abdominal distention, pain, blood in stools, constipation, diarrhea, nausea, vomiting, or early satiety. Denies pain with intercourse, dysuria, frequency, hematuria or incontinence. Denies hot flashes, pelvic pain, vaginal bleeding or vaginal discharge.   Denies joint pain, back pain or muscle pain/cramps. Denies itching, rash, or wounds. Denies dizziness, headaches, numbness or seizures. Denies swollen lymph nodes or glands, denies easy bruising or bleeding. Denies anxiety, depression, confusion, or decreased concentration.  Physical Exam: BP 138/77 (BP Location: Right Arm, Patient Position: Sitting)   Pulse 68   Temp 97.9 F (36.6 C) (Tympanic)   Resp 18   Ht $R'5\' 2"'Sy$  (1.575 m)   Wt 183 lb 4.8 oz (83.1 kg)   SpO2 97%   BMI 33.53 kg/m  General: Alert, oriented, no acute distress. HEENT: Atraumatic, normocephalic, sclera anicteric. Chest: Clear to auscultation bilaterally.   Cardiovascular: Regular rate and rhythm, no murmurs. Abdomen: Obese, soft, nontender.  Normoactive bowel sounds.  No masses or hepatosplenomegaly appreciated.   Extremities: Grossly normal range of motion.  Warm, well perfused.  No edema bilaterally. Lymphatics: No cervical, supraclavicular, or inguinal adenopathy. GU: Normal appearing external genitalia without erythema, excoriation, or lesions.  Mild changes related to prior surgery with some of right apical labia minora surgically absent.  No lesions.  Speculum exam reveals vaginal mucosa mildly atrophic.  No lesions or masses.  Cervix normal-appearing.  Laboratory & Radiologic Studies: None new  Assessment & Plan: Ariel Rios is a 65 y.o. woman with Stage IIC vulvar melanoma who presents for surveillance visit after immunotherapy treatment  was discontinued secondary to intolerance.   Patient is doing very well.  She is NED on exam today.  He remains asymptomatic.    We will plan for visits every 3 months.  She has not seen a dermatologist yet but has a office that she is planning to call.  I have encouraged to do this on Monday.  I have also encouraged her to look at her vulva so that she knows what her baseline is now.  We discussed that if she develops any visible lesions or new symptoms, that she needs to call me to be seen before her neck scheduled visit.    She is due for colonoscopy.  I offered to place a referral for this which the patient says she does not need.  32 minutes of total time was spent for this patient encounter, including preparation, face-to-face counseling with the patient and coordination of care, and documentation of the encounter.  [name], MD  Division of Gynecologic Oncology  Department of Obstetrics and Gynecology  [employer] [facility] [employer]

## 2021-06-10 ENCOUNTER — Inpatient Hospital Stay: Payer: No Typology Code available for payment source | Attending: Gynecologic Oncology | Admitting: Gynecologic Oncology

## 2021-06-10 ENCOUNTER — Encounter: Payer: Self-pay | Admitting: Gynecologic Oncology

## 2021-06-10 ENCOUNTER — Other Ambulatory Visit: Payer: Self-pay

## 2021-06-10 VITALS — BP 138/77 | HR 68 | Temp 97.9°F | Resp 18 | Ht 62.0 in | Wt 183.3 lb

## 2021-06-10 DIAGNOSIS — Z8541 Personal history of malignant neoplasm of cervix uteri: Secondary | ICD-10-CM | POA: Insufficient documentation

## 2021-06-10 DIAGNOSIS — Z9079 Acquired absence of other genital organ(s): Secondary | ICD-10-CM | POA: Diagnosis not present

## 2021-06-10 DIAGNOSIS — C519 Malignant neoplasm of vulva, unspecified: Secondary | ICD-10-CM

## 2021-06-10 DIAGNOSIS — Z9221 Personal history of antineoplastic chemotherapy: Secondary | ICD-10-CM | POA: Insufficient documentation

## 2021-06-10 NOTE — Patient Instructions (Signed)
It was good to see you today!  I do not see anything that looks like cancer recurrence on your exam today.  Please look at your bottom to establish a baseline.  If you see or feel any new areas between visits with me, please call to see me sooner.  Please also call early next week to get established with a dermatologist.  We will plan on visits every 3 months for now.

## 2021-06-13 ENCOUNTER — Encounter: Payer: Self-pay | Admitting: Family Medicine

## 2021-06-13 DIAGNOSIS — Z1283 Encounter for screening for malignant neoplasm of skin: Secondary | ICD-10-CM

## 2021-08-26 ENCOUNTER — Other Ambulatory Visit: Payer: Self-pay

## 2021-08-26 ENCOUNTER — Inpatient Hospital Stay: Payer: No Typology Code available for payment source | Attending: Gynecologic Oncology | Admitting: Gynecologic Oncology

## 2021-08-26 ENCOUNTER — Encounter: Payer: Self-pay | Admitting: Gynecologic Oncology

## 2021-08-26 VITALS — BP 139/64 | HR 80 | Temp 97.1°F | Resp 16 | Ht 62.0 in | Wt 179.0 lb

## 2021-08-26 DIAGNOSIS — Z9221 Personal history of antineoplastic chemotherapy: Secondary | ICD-10-CM | POA: Insufficient documentation

## 2021-08-26 DIAGNOSIS — Z1211 Encounter for screening for malignant neoplasm of colon: Secondary | ICD-10-CM

## 2021-08-26 DIAGNOSIS — Z9079 Acquired absence of other genital organ(s): Secondary | ICD-10-CM | POA: Insufficient documentation

## 2021-08-26 DIAGNOSIS — C519 Malignant neoplasm of vulva, unspecified: Secondary | ICD-10-CM

## 2021-08-26 DIAGNOSIS — M353 Polymyalgia rheumatica: Secondary | ICD-10-CM | POA: Insufficient documentation

## 2021-08-26 DIAGNOSIS — I872 Venous insufficiency (chronic) (peripheral): Secondary | ICD-10-CM | POA: Insufficient documentation

## 2021-08-26 DIAGNOSIS — Z8544 Personal history of malignant neoplasm of other female genital organs: Secondary | ICD-10-CM | POA: Insufficient documentation

## 2021-08-26 NOTE — Patient Instructions (Addendum)
Great to see you today!  I do not see any evidence of cancer recurrence on your exam.  I have talked to Santiago Glad, our nurse navigator, to see about getting you into a dermatologist sooner than your visit scheduled in March.  She will call you to let you know what she finds out.  I will see you back in 3 months.  Please try to look at your bottom at least once a month for any new spots.  If you have any symptoms between now and your follow-up visit with me, please call to see me sooner.  I have placed a referral for screening colonoscopy for you as well.

## 2021-08-26 NOTE — Progress Notes (Signed)
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  She denies pain   12/07/2020 Initial Biopsy   FINAL MICROSCOPIC DIAGNOSIS:   A.   RIGHT VULVA, BIOPSY: PRIMARY MALIGNANT MELANOMA OF VULVA, MELANOMA  TABLE BELOW  MALIGNANT MELANOMA (AJCC 8TH EDITION):  PROCEDURE: BIOPSY  SPECIMEN ANATOMIC SITE: RIGHT VULVA  BRESLOW'S DEPTH/MAXIMUM TUMOR THICKNESS: 5.05 MM  CLARK/ANATOMIC LEVEL* IV  MARGINS:  PERIPHERAL: INVOLVED  DEEP: FREE  ULCERATION: PRESENT  MITOTIC INDEX: 11/mm2  LYMPHO-VASCULAR INVASION: PRESENT*  NEUTROTROPISM: ABSENT  TUMOR-INFILTRATING LYMPHOCYTES: NON-BRISK  LYMPH NODES: N/A  PATHOLOGIC STAGE: PT4B NX MX  *VULVAR MELANOMAS MAY NOT HAVE THE SAME BEHAVIOR STAGE FOR STAGE AS SUPERFICIAL SPREADING CUTANEOUS MELANOMA, AND LANDMARKS SUCH AS BRESLOW'S DEPTH MAY NOT APPLY. NONETHELESS, THIS SHOULD BE TREATED AS A PT4B MELANOMA AND COMPLETE EXCISION, WITH POSSIBLE LYMPH NODE EVALUATION, UNDERTAKEN. SEE COMMENT FOR IHC PROFILE.    COMMENT:  Sections show confluent and nested growth of atypical melanocytes along vulvar epithelium in an in-situ melanoma growth pattern at the periphery, with a central large ulcerated nodule of similar atypical  melanocytes, consonant with primary vulvar melanoma. Notably, IHC was performed to evaluate the lineage and is diagnostic for melanoma. Lesional melanocytes are positive with S100, MelanA, and show a high ki-67 uptake, and are negative with CDX-2, synaptophysin, CK20, TTF-1, CK7, CD56, and lymphoid markers CD5, CD3, CD20 highlight only background lymphocytes.    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 The only potential hypermetabolic finding of note is a small flat focus of accentuated metabolic activity along the anterior vaginal vestibule/perineum, maximum SUV 6.5. Most common cause for this type of appearance would be a trace amount of incontinence of urinary FDG, but given the proximity to the reported vulvar melanoma, this small focus is considered indeterminate. 2. Other imaging findings of potential clinical significance: Aortic Atherosclerosis (ICD10-I70.0). Small type 1 hiatal hernia. Palatine tonsilloliths. Colonic diverticulosis.   01/18/2021 Surgery   Preoperative Diagnosis: Vulvar melanoma  Postoperative Diagnosis: Same  Procedures performed: Wide radical vulvectomy, R inguinofemoral sentinel LN biopsy  Attending Surgeon: [name], MD  Fellow: [name]  Resident: Melony Overly, MD; Darrin Nipper, MD  Anesthesia: General endotracheal  Findings:  1. Vulva with biopsy site changes at R labia minora adjacent to the clitoris. No visible tumor. 2. Inguinofemoral nodes not clinically enlarged or abnormal.   Specimens:  1. R inguinal sentinel LN, hot and blue 2. Portion of vulva 3. Superior deep margin    01/18/2021 Pathology Results   Only melanoma in situ seen in final resection, SLN negative   02/08/2021 - 03/22/2021 Chemotherapy          03/29/2021 Genetic Testing   Negative genetic testing on the Multi-cancer +RNA panel.  The report date is March 29, 2021.  The Multi-Gene Panel offered by [facility] includes sequencing and/or deletion duplication testing of the following 84 genes: AIP, ALK, APC, ATM, AXIN2,BAP1,  BARD1, BLM, BMPR1A, BRCA1, BRCA2, BRIP1, CASR,  CDC73, CDH1, CDK4, CDKN1B, CDKN1C, CDKN2A (p14ARF), CDKN2A (p16INK4a), CEBPA, CHEK2, CTNNA1, DICER1,  DIS3L2, EGFR (c.2369C>T, p.Thr790Met variant only), EPCAM (Deletion/duplication testing only), FH, FLCN, GATA2, GPC3, GREM1 (Promoter region deletion/duplication testing only), HOXB13 (c.251G>A, p.Gly84Glu), HRAS, KIT, MAX, MEN1, MET, MITF (c.952G>A, p.Glu318Lys variant only), MLH1, MSH2, MSH3, MSH6, MUTYH, NBN, NF1, NF2, NTHL1, PALB2, PDGFRA, PHOX2B, PMS2, POLD1, POLE, POT1, PRKAR1A, PTCH1, PTEN, RAD50, RAD51C, RAD51D, RB1, RECQL4, RET, RUNX1, SDHAF2, SDHA (sequence changes only), SDHB, SDHC, SDHD, SMAD4, SMARCA4, SMARCB1, SMARCE1, STK11, SUFU, TERC, TERT, TMEM127, TP53, TSC1, TSC2, VHL, WRN and WT1.     04/29/2021 PET scan   1. No vulvar mass or residual hypermetabolism. 2. No findings suspicious for metastatic disease.     Interval History: Patient presents today for surveillance Rios.  She notes overall doing well.  She has not been regularly looking at her vulva for new lesions but denies any symptoms including pain, bleeding, discharge, pruritus.  She endorses normal bowel and bladder function.  Her appetite is improving.  In terms of her overall health, she feels almost back to her baseline prior to starting adjuvant treatment.  She has worked to get into see a Paediatric nurse but has not been able to get an appointment until March.  Past Medical/Surgical History: Past Medical History:  Diagnosis Date   BMI 34.0-34.9,adult    Family history of melanoma    Melanotic macule of vulvar labia    Migraine    Polymyalgia rheumatica ([place])    Venous insufficiency     History reviewed. No pertinent surgical history.  Family History  Problem Relation Age of Onset   Diabetes Mother    AVM Father    Melanoma Father        multiple melanomas dx between 85-90   Cancer Father 42       tumor on kidney   Cancer Paternal Grandfather        laryngeal cancer   Cancer Daughter 46       appendiceal cancer   Breast cancer Neg Hx    Ovarian cancer Neg Hx    Colon cancer Neg Hx    Uterine cancer  Neg Hx     Social History   Socioeconomic History   Marital status: Married    Spouse name: Not on file   Number of children: 2   Years of education: Not on file   Highest education level: Not on file  Occupational History   Occupation: shelf clerk   Occupation: stocking  Tobacco Use   Smoking status: Never   Smokeless tobacco: Never  Vaping Use   Vaping Use: Never used  Substance and Sexual Activity   Alcohol use: Yes    Comment: occasional   Drug use: No   Sexual activity: Not Currently  Other Topics Concern   Not on file  Social History Narrative   Not on file   Social Determinants of Health   Financial Resource Strain: Not on file  Food Insecurity: Not on file  Transportation Needs: Not on file  Physical Activity: Not on file  Stress: Not on file  Social Connections: Not on file    Current Medications:  Current Outpatient Medications:    bisacodyl (DULCOLAX) 5 MG EC tablet, Take 5 mg by mouth daily as needed for moderate constipation. (Patient not taking: Reported on 06/10/2021), Disp: , Rfl:    hydrochlorothiazide (HYDRODIURIL) 25 MG tablet, TAKE 1 TABLET (25 MG TOTAL) BY MOUTH DAILY AS NEEDED., Disp: 90 tablet, Rfl: 3  polyethylene glycol (MIRALAX / GLYCOLAX) 17 g packet, Take 17 g by mouth daily as needed. (Patient not taking: Reported on 06/10/2021), Disp: , Rfl:    predniSONE (DELTASONE) 20 MG tablet, Take 1 tablet (20 mg total) by mouth daily with breakfast., Disp: 30 tablet, Rfl: 6   vitamin B-12 (CYANOCOBALAMIN) 1000 MCG tablet, Take by mouth every other day., Disp: , Rfl:    VITAMIN D, CHOLECALCIFEROL, PO, Take 2,000 Int'l Units/1.52m by mouth., Disp: , Rfl:   Review of Systems: Denies appetite changes, fevers, chills, fatigue, unexplained weight changes. Denies hearing loss, neck lumps or masses, mouth sores, ringing in ears or voice changes. Denies cough or wheezing.  Denies shortness of breath. Denies chest pain or palpitations. Denies leg  swelling. Denies abdominal distention, pain, blood in stools, constipation, diarrhea, nausea, vomiting, or early satiety. Denies pain with intercourse, dysuria, frequency, hematuria or incontinence. Denies hot flashes, pelvic pain, vaginal bleeding or vaginal discharge.   Denies joint pain, back pain or muscle pain/cramps. Denies itching, rash, or wounds. Denies dizziness, headaches, numbness or seizures. Denies swollen lymph nodes or glands, denies easy bruising or bleeding. Denies anxiety, depression, confusion, or decreased concentration.  Physical Exam: BP 139/64 (BP Location: Left Arm, Patient Position: Sitting)   Pulse 80   Temp (!) 97.1 F (36.2 C) (Tympanic)   Resp 16   Ht _0  (1.575 m)   Wt 179 lb (81.2 kg)   SpO2 97%   BMI 32.74 kg/m  General: Alert, oriented, no acute distress. HEENT: Normocephalic, atraumatic, sclera anicteric. Chest: Unlabored breathing on room air. Cardiovascular: Regular rate and rhythm, no murmurs. Abdomen: soft, nontender.  Normoactive bowel sounds.  No masses or hepatosplenomegaly appreciated.   Extremities: Grossly normal range of motion.  Warm, well perfused.  Trace edema bilaterally. Skin: No rashes or lesions noted. Lymphatics: No cervical, supraclavicular, or inguinal adenopathy. GU: External female genitalia notable for some changes along the right labia related to previous surgical excision.  No lesions or masses.  No atypical vasculature or discoloration.  Laboratory & Radiologic Studies: None new  Assessment & Plan: LHASSET CHAVIANOis a 65y.o. woman with Stage IIC vulvar melanoma who presents for surveillance Rios after immunotherapy treatment was discontinued secondary to intolerance.   Patient is doing very well.  She is NED on exam today and remains asymptomatic.   We will plan for visits every 3 months.  We discussed that if she develops any visible lesions or new symptoms, that she needs to call me to be seen before her  neck scheduled Rios.    She has now been in touch with a dermatology office but does not have a Rios until March.  I have asked our nurse navigator, KSantiago Glad to reach out to see if she can get the patient in sooner either with that dermatology group or another one in town.   She is due for colonoscopy.  I offered again to place a referral for colonoscopy and patient was amenable.  This was ordered today.  32 minutes of total time was spent for this patient encounter, including preparation, face-to-face counseling with the patient and coordination of care, and documentation of the encounter.  KJeral Pinch MD  Division of Ariel Oncology  Department of Obstetrics and Gynecology  [facility]

## 2021-08-29 ENCOUNTER — Telehealth: Payer: Self-pay | Admitting: Oncology

## 2021-08-29 NOTE — Telephone Encounter (Signed)
[facility] and they have openings starting 09/07/21.  They asked for records to be faxed and they will call patient with an appointment. Faxed referral and records successfully to [phone].  Called [name] and let her know.  Also gave her the phone number for [facility].

## 2021-09-06 ENCOUNTER — Telehealth: Payer: Self-pay | Admitting: Medical Oncology

## 2021-09-06 NOTE — Telephone Encounter (Signed)
err

## 2021-09-07 ENCOUNTER — Telehealth: Payer: Self-pay

## 2021-09-07 NOTE — Telephone Encounter (Signed)
Called her regarding fax from [facility] to confirm she has appt.  She has appt on 12/20 with [facility].

## 2021-11-08 ENCOUNTER — Other Ambulatory Visit: Payer: Self-pay | Admitting: Family Medicine

## 2021-11-08 DIAGNOSIS — Z1231 Encounter for screening mammogram for malignant neoplasm of breast: Secondary | ICD-10-CM

## 2021-11-16 ENCOUNTER — Encounter: Payer: Self-pay | Admitting: Gastroenterology

## 2021-11-21 ENCOUNTER — Encounter: Payer: Self-pay | Admitting: Gynecologic Oncology

## 2021-11-28 ENCOUNTER — Encounter: Payer: Self-pay | Admitting: Gynecologic Oncology

## 2021-11-28 ENCOUNTER — Other Ambulatory Visit: Payer: Self-pay

## 2021-11-28 ENCOUNTER — Inpatient Hospital Stay: Payer: No Typology Code available for payment source | Attending: Gynecologic Oncology | Admitting: Gynecologic Oncology

## 2021-11-28 VITALS — BP 141/79 | HR 79 | Temp 98.4°F | Resp 16 | Ht 61.81 in | Wt 187.4 lb

## 2021-11-28 DIAGNOSIS — N9089 Other specified noninflammatory disorders of vulva and perineum: Secondary | ICD-10-CM | POA: Insufficient documentation

## 2021-11-28 DIAGNOSIS — C519 Malignant neoplasm of vulva, unspecified: Secondary | ICD-10-CM

## 2021-11-28 DIAGNOSIS — Z9221 Personal history of antineoplastic chemotherapy: Secondary | ICD-10-CM | POA: Diagnosis not present

## 2021-11-28 DIAGNOSIS — Z8544 Personal history of malignant neoplasm of other female genital organs: Secondary | ICD-10-CM | POA: Insufficient documentation

## 2021-11-28 NOTE — Progress Notes (Signed)
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  She denies pain   12/07/2020 Initial Biopsy   FINAL MICROSCOPIC DIAGNOSIS:   A.   RIGHT VULVA, BIOPSY: PRIMARY MALIGNANT MELANOMA OF VULVA, MELANOMA  TABLE BELOW  MALIGNANT MELANOMA (AJCC 8TH EDITION):  PROCEDURE: BIOPSY  SPECIMEN ANATOMIC SITE: RIGHT VULVA  BRESLOW'S DEPTH/MAXIMUM TUMOR THICKNESS: 5.05 MM  CLARK/ANATOMIC LEVEL* IV  MARGINS:  PERIPHERAL: INVOLVED  DEEP: FREE  ULCERATION: PRESENT  MITOTIC INDEX: 11/mm2  LYMPHO-VASCULAR INVASION: PRESENT*  NEUTROTROPISM: ABSENT  TUMOR-INFILTRATING LYMPHOCYTES: NON-BRISK  LYMPH NODES: N/A  PATHOLOGIC STAGE: PT4B NX MX  *VULVAR MELANOMAS MAY NOT HAVE THE SAME BEHAVIOR STAGE FOR STAGE AS SUPERFICIAL SPREADING CUTANEOUS MELANOMA, AND LANDMARKS SUCH AS BRESLOW'S DEPTH MAY NOT APPLY. NONETHELESS, THIS SHOULD BE TREATED AS A PT4B MELANOMA AND COMPLETE EXCISION, WITH POSSIBLE LYMPH NODE EVALUATION, UNDERTAKEN. SEE COMMENT FOR IHC PROFILE.    COMMENT:  Sections show confluent and nested growth of atypical melanocytes along vulvar epithelium in an in-situ melanoma growth pattern at the periphery, with a central large ulcerated nodule of similar atypical  melanocytes, consonant with primary vulvar melanoma. Notably, IHC was performed to evaluate the lineage and is diagnostic for melanoma. Lesional melanocytes are positive with S100, MelanA, and show a high ki-67 uptake, and are negative with CDX-2, synaptophysin, CK20, TTF-1, CK7, CD56, and lymphoid markers CD5, CD3, CD20 highlight only background lymphocytes.    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 The only potential hypermetabolic finding of note is a small flat focus of accentuated metabolic activity along the anterior vaginal vestibule/perineum, maximum SUV 6.5. Most common cause for this type of appearance would be a trace amount of incontinence of urinary FDG, but given the proximity to the reported vulvar melanoma, this small focus is considered indeterminate. 2. Other imaging findings of potential clinical significance: Aortic Atherosclerosis (ICD10-I70.0). Small type 1 hiatal hernia. Palatine tonsilloliths. Colonic diverticulosis.   01/18/2021 Surgery   Preoperative Diagnosis: Vulvar melanoma  Postoperative Diagnosis: Same  Procedures performed: Wide radical vulvectomy, R inguinofemoral sentinel LN biopsy  Attending Surgeon: [name], MD  Fellow: [name]  Resident: Melony Overly, MD; Darrin Nipper, MD  Anesthesia: General endotracheal  Findings:  1. Vulva with biopsy site changes at R labia minora adjacent to the clitoris. No visible tumor. 2. Inguinofemoral nodes not clinically enlarged or abnormal.   Specimens:  1. R inguinal sentinel LN, hot and blue 2. Portion of vulva 3. Superior deep margin    01/18/2021 Pathology Results   Only melanoma in situ seen in final resection, SLN negative   02/08/2021 - 03/22/2021 Chemotherapy          03/29/2021 Genetic Testing   Negative genetic testing on the Multi-cancer +RNA panel.  The report date is March 29, 2021.  The Multi-Gene Panel offered by [facility] includes sequencing and/or deletion duplication testing of the following 84 genes: AIP, ALK, APC, ATM, AXIN2,BAP1,  BARD1, BLM, BMPR1A, BRCA1, BRCA2, BRIP1, CASR,  CDC73, CDH1, CDK4, CDKN1B, CDKN1C, CDKN2A (p14ARF), CDKN2A (p16INK4a), CEBPA, CHEK2, CTNNA1, DICER1,  DIS3L2, EGFR (c.2369C>T, p.Thr790Met variant only), EPCAM (Deletion/duplication testing only), FH, FLCN, GATA2, GPC3, GREM1 (Promoter region deletion/duplication testing only), HOXB13 (c.251G>A, p.Gly84Glu), HRAS, KIT, MAX, MEN1, MET, MITF (c.952G>A, p.Glu318Lys variant only), MLH1, MSH2, MSH3, MSH6, MUTYH, NBN, NF1, NF2, NTHL1, PALB2, PDGFRA, PHOX2B, PMS2, POLD1, POLE, POT1, PRKAR1A, PTCH1, PTEN, RAD50, RAD51C, RAD51D, RB1, RECQL4, RET, RUNX1, SDHAF2, SDHA (sequence changes only), SDHB, SDHC, SDHD, SMAD4, SMARCA4, SMARCB1, SMARCE1, STK11, SUFU, TERC, TERT, TMEM127, TP53, TSC1, TSC2, VHL, WRN and WT1.     04/29/2021 PET scan   1. No vulvar mass or residual hypermetabolism. 2. No findings suspicious for metastatic disease.     Interval History: Patient reports that she saw dermatology in December.  She had 3 lesions that were biopsied.  None showed malignancy but the dermatologist told her that they will keep a close eye on 1 of these areas.  She is scheduled for mammogram tomorrow and colonoscopy in early April.  A day or 2 before reaching out at the end of February, she felt some vulvar irritation.  She did a self-exam with a flashlight and on the inner part of the right labia, inferior to her prior lesion, noticed a spot that she had not seen before.  She is unsure whether she has ever looked this internally.  Her irritation has improved and almost completely resolved.  She denies any bleeding, discharge, or vulvar pain.    Past Medical/Surgical History: Past Medical History:  Diagnosis Date   BMI 34.0-34.9,adult    Family history of melanoma    Melanotic macule of vulvar labia    Migraine    Polymyalgia rheumatica ([place])    Venous insufficiency     History reviewed. No pertinent surgical history.  Family History  Problem Relation Age of Onset   Diabetes Mother    AVM Father    Melanoma Father        multiple melanomas dx between 57-90   Cancer Father 50       tumor on kidney    Cancer Paternal Grandfather        laryngeal cancer   Cancer Daughter 40       appendiceal cancer   Breast cancer Neg Hx    Ovarian cancer Neg Hx    Colon cancer Neg Hx    Uterine cancer Neg Hx     Social History   Socioeconomic History   Marital status: Married    Spouse name: Not on file   Number of children: 2   Years of education: Not on file   Highest education level: Not on file  Occupational History   Occupation: shelf clerk   Occupation: stocking  Tobacco Use   Smoking status: Never   Smokeless tobacco: Never  Vaping Use   Vaping Use: Never used  Substance and Sexual Activity   Alcohol use: Yes    Comment: occasional   Drug use: No   Sexual activity: Not Currently  Other Topics Concern   Not on file  Social History Narrative   Not on file   Social Determinants of Health   Financial Resource Strain: Not on file  Food Insecurity: Not on file  Transportation Needs: Not on file  Physical Activity: Not on file  Stress: Not on file  Social Connections: Not on file    Current Medications:  Current Outpatient Medications:    hydrochlorothiazide (HYDRODIURIL) 25 MG tablet, TAKE 1 TABLET (25  MG TOTAL) BY MOUTH DAILY AS NEEDED., Disp: 90 tablet, Rfl: 3   predniSONE (DELTASONE) 20 MG tablet, Take 1 tablet (20 mg total) by mouth daily with breakfast., Disp: 30 tablet, Rfl: 6   vitamin B-12 (CYANOCOBALAMIN) 1000 MCG tablet, Take by mouth every other day., Disp: , Rfl:    VITAMIN D, CHOLECALCIFEROL, PO, Take 2,000 Int'l Units/1.32m by mouth., Disp: , Rfl:   Review of Systems: Denies appetite changes, fevers, chills, fatigue, unexplained weight changes. Denies hearing loss, neck lumps or masses, mouth sores, ringing in ears or voice changes. Denies cough or wheezing.  Denies shortness of breath. Denies chest pain or palpitations. Denies leg swelling. Denies abdominal distention, pain, blood in stools, constipation, diarrhea, nausea, vomiting, or early  satiety. Denies pain with intercourse, dysuria, frequency, hematuria or incontinence. Denies hot flashes, pelvic pain, vaginal bleeding or vaginal discharge.   Denies joint pain, back pain or muscle pain/cramps. Denies itching, rash, or wounds. Denies dizziness, headaches, numbness or seizures. Denies swollen lymph nodes or glands, denies easy bruising or bleeding. Denies anxiety, depression, confusion, or decreased concentration.  Physical Exam: BP (!) 141/79 (BP Location: Left Arm, Patient Position: Sitting)    Pulse 79    Temp 98.4 F (36.9 C) (Oral)    Resp 16    Ht 5' 1.81" (1.57 m)    Wt 187 lb 6.4 oz (85 kg)    SpO2 96%    BMI 34.49 kg/m  General: Alert, oriented, no acute distress. HEENT: Normocephalic, atraumatic, sclera anicteric. Chest: Unlabored breathing on room air. Abdomen: soft, nontender.  Normoactive bowel sounds.  No masses or hepatosplenomegaly appreciated.   Extremities: Grossly normal range of motion.  Warm, well perfused.  No edema bilaterally. Skin: No rashes or lesions noted. Lymphatics: No cervical, supraclavicular, or inguinal adenopathy. GU: External female genitalia notable for changes from prior surgery along the right vulva.  There is mild discoloration along the most inferior aspect of the incision and just medial to this area.  These are changes that I do not remember from her last exam.  No other areas of discoloration, lesions, or atypical vasculature.  Vulvar biopsy Preoperative diagnosis: Skin changes at prior excision site, history of vulvar melanoma Postoperative diagnosis: Same as above Physician: TBerline LopesMD Procedure: Vulvar biopsy Estimated blood loss: 15 cc Specimens: Vulvar biopsies Procedure: After the procedure was discussed with the patient including risks and benefits, she gave verbal consent.  She was then placed in dorsolithotomy position and an exam was performed.  An area of mild discoloration was noted at the inferior aspect of her  prior healed incision and just adjacent to it on the medial aspect.  This area was cleansed with Betadine x3.  1 cc of 2% lidocaine was then injected subdermally for local anesthesia.  After adequate time for anesthesia was given, two 3 mm punch biopsies were taken and an additional strip of tissue between the 2 biopsies was removed using pickups and Metzenbaums.  These were all placed in formalin to be sent to pathology.  Hemostasis was achieved with silver nitrate and pressure.  Overall the patient tolerated the procedure well.  Laboratory & Radiologic Studies: None new  Assessment & Plan: Ariel TRNKAis a 66y.o. woman with Stage IIC vulvar melanoma who presents for surveillance visit after immunotherapy treatment was discontinued secondary to intolerance.   Findings along the inferior aspect of the prior incision today as well as recent symptoms raise the concern for possible disease recurrence.  Several biopsies  were taken.  I will call the patient back with these results.    We will tentatively schedule her for a follow-up visit in 3 months.  If biopsies necessitate change in treatment plan, we will discuss this further when I call her with the results.  She is now established with dermatology and is following regularly with them.  28 minutes of total time was spent for this patient encounter, including preparation, face-to-face counseling with the patient and coordination of care, and documentation of the encounter.  [name], MD  Division of Gynecologic Oncology  Department of Obstetrics and Gynecology  [facility]

## 2021-11-28 NOTE — Patient Instructions (Signed)
I will call you when I have your biopsy results back, sometime later this week. ? ?If biopsy shows no concern for recurrent melanoma, I will plan to see you back in 3 months for surveillance visit.  If biopsy shows recurrence, then we will discuss next steps when I call you. ?

## 2021-11-29 ENCOUNTER — Ambulatory Visit
Admission: RE | Admit: 2021-11-29 | Discharge: 2021-11-29 | Disposition: A | Discharge status: Home / Self Care | Payer: No Typology Code available for payment source | Source: Ambulatory Visit | Attending: Family Medicine | Admitting: Family Medicine

## 2021-11-29 DIAGNOSIS — Z1231 Encounter for screening mammogram for malignant neoplasm of breast: Secondary | ICD-10-CM

## 2021-11-30 ENCOUNTER — Telehealth: Payer: Self-pay | Admitting: Gynecologic Oncology

## 2021-11-30 LAB — SURGICAL PATHOLOGY

## 2021-11-30 NOTE — Telephone Encounter (Signed)
Called patient with biopsy results - she is very happy with this news. Plan for surveillance visit in 3 months unless she notices new area or has symptoms before. ? ?[name] MD ?Gynecologic Oncology ? ?

## 2021-12-05 ENCOUNTER — Other Ambulatory Visit: Payer: Self-pay | Admitting: Hematology and Oncology

## 2021-12-09 ENCOUNTER — Ambulatory Visit (AMBULATORY_SURGERY_CENTER): Payer: No Typology Code available for payment source | Admitting: *Deleted

## 2021-12-09 ENCOUNTER — Other Ambulatory Visit: Payer: Self-pay

## 2021-12-09 VITALS — Ht 61.0 in | Wt 189.0 lb

## 2021-12-09 DIAGNOSIS — Z1211 Encounter for screening for malignant neoplasm of colon: Secondary | ICD-10-CM

## 2021-12-09 MED ORDER — NA SULFATE-K SULFATE-MG SULF 17.5-3.13-1.6 GM/177ML PO SOLN: 2.0000 | Freq: Once | ORAL | 0 refills | Status: AC

## 2021-12-09 NOTE — Progress Notes (Signed)
HPI: Ariel Rios is a 66 y.o. female, who is here today for her routine physical.  Last CPE: 11/23/20  Regular exercise: 1 Pound wt lifting and stationary bike. Following a healthful diet: Mostly cooking at home. Plenty of vegetable and fruits. She eats even if she is not hungry.  Chronic medical problems: B12 deficiency, lower extremity edema, polymyalgia rheumatica, hyperlipidemia, vitamin D deficiency, vulvar melanoma, and aortic atherosclerosis among some.  Immunization History  Administered Date(s) Administered   Influenza Split 08/29/2011, 06/18/2012   Influenza,inj,Quad PF,6+ Mos 07/29/2013, 06/08/2014, 06/21/2015, 07/04/2016, 07/23/2018, 07/25/2019, 07/26/2020   Influenza-Unspecified 07/23/2018   Pneumococcal Polysaccharide-23 11/23/2020   Td 09/11/2005   Tdap 11/17/2014   Zoster Recombinat (Shingrix) 03/19/2018, 05/21/2018   Health Maintenance  Topic Date Due   COLONOSCOPY (Pts 45-38yrs Insurance coverage will need to be confirmed)  01/16/2018   INFLUENZA VACCINE  12/23/2021 (Originally 04/25/2021)   COVID-19 Vaccine (1) 12/25/2021 (Originally 03/12/1957)   PAP SMEAR-Modifier  11/24/2023   MAMMOGRAM  11/30/2023   TETANUS/TDAP  11/17/2024   Hepatitis C Screening  Completed   HIV Screening  Completed   Zoster Vaccines- Shingrix  Completed   HPV VACCINES  Aged Out   B12 deficiency: Currently she is on B12 supplementation at 1000 mcg daily.  Lab Results  Component Value Date   VITAMINB12 828 11/23/2020   Vitamin D deficiency: Currently she is on vitamin D 2000 units daily. Last 25 OH vitamin D 35 in 11/2020.  Since her last visit she was dx'ed with vulvar melanoma, s/p wide radical vulvectomy, right inguinofemoral sentinel lymph node biopsy on 01/18/2021. She did not tolerate immunotherapy. Follows with gyn oncologist q 3 months.  Appointment with dermatologist tomorrow, following q 3 months.  HLD: She is on non pharmacologic treatment. Aortic  atherosclerosis was seen on PET scan in 12/2020.  Lab Results  Component Value Date   CHOL 207 (H) 11/23/2020   HDL 74.30 11/23/2020   LDLCALC 112 (H) 11/23/2020   LDLDIRECT 150.4 10/21/2013   TRIG 105.0 11/23/2020   CHOLHDL 3 11/23/2020  She is on HCTZ 25 mg daily as needed to treat lower extremity edema.  Lab Results  Component Value Date   CREATININE 0.67 04/26/2021   BUN 19 04/26/2021   NA 140 04/26/2021   K 3.4 (L) 04/26/2021   CL 103 04/26/2021   CO2 26 04/26/2021   Polymyalgia rheumatica: Currently she is on prednisone 20 mg daily, pain reoccurred after stopping medication while trying immunotherapy for melanoma.  While on prednisone pain is greatly improved, she is tolerating medication well.  Review of Systems  Constitutional:  Positive for fatigue. Negative for appetite change and fever.  HENT:  Negative for hearing loss, mouth sores and sore throat.   Eyes:  Negative for redness and visual disturbance.  Respiratory:  Negative for cough, shortness of breath and wheezing.   Cardiovascular:  Negative for chest pain and leg swelling.  Gastrointestinal:  Negative for abdominal pain, nausea and vomiting.       No changes in bowel habits.  Endocrine: Negative for cold intolerance, heat intolerance, polydipsia, polyphagia and polyuria.  Genitourinary:  Negative for decreased urine volume, dysuria and hematuria.  Musculoskeletal:  Positive for arthralgias. Negative for gait problem.  Skin:  Negative for color change and rash.  Allergic/Immunologic: Negative for environmental allergies.  Neurological:  Negative for syncope, weakness and headaches.  Hematological:  Negative for adenopathy. Does not bruise/bleed easily.  Psychiatric/Behavioral:  Negative for confusion and sleep disturbance. The  patient is not nervous/anxious.   All other systems reviewed and are negative.  Current Outpatient Medications on File Prior to Visit  Medication Sig Dispense Refill    hydrochlorothiazide (HYDRODIURIL) 25 MG tablet TAKE 1 TABLET (25 MG TOTAL) BY MOUTH DAILY AS NEEDED. 90 tablet 3   Multiple Vitamins-Minerals (MULTIVITAMIN WITH MINERALS) tablet Take 1 tablet by mouth daily.     predniSONE (DELTASONE) 20 MG tablet Take 1 tablet (20 mg total) by mouth daily with breakfast. 30 tablet 6   vitamin B-12 (CYANOCOBALAMIN) 1000 MCG tablet Take by mouth every other day.     VITAMIN D, CHOLECALCIFEROL, PO Take 2,000 Int'l Units/1.65m2 by mouth.     [DISCONTINUED] prochlorperazine (COMPAZINE) 10 MG tablet Take 1 tablet (10 mg total) by mouth every 6 (six) hours as needed for nausea or vomiting. (Patient not taking: Reported on 03/07/2021) 30 tablet 1   No current facility-administered medications on file prior to visit.   Past Medical History:  Diagnosis Date   Anemia    BMI 34.0-34.9,adult    Family history of melanoma    Melanotic macule of vulvar labia    Migraine    Polymyalgia rheumatica (HCC)    Venous insufficiency    History reviewed. No pertinent surgical history.  No Known Allergies  Family History  Problem Relation Age of Onset   Diabetes Mother    AVM Father    Melanoma Father        multiple melanomas dx between 72-90   Cancer Father 2       tumor on kidney   Cancer Paternal Grandfather        laryngeal cancer   Cancer Daughter 48       appendiceal cancer   Breast cancer Neg Hx    Ovarian cancer Neg Hx    Colon cancer Neg Hx    Uterine cancer Neg Hx    Colon polyps Neg Hx    Esophageal cancer Neg Hx    Stomach cancer Neg Hx    Rectal cancer Neg Hx     Social History   Socioeconomic History   Marital status: Married    Spouse name: Not on file   Number of children: 2   Years of education: Not on file   Highest education level: Not on file  Occupational History   Occupation: shelf clerk   Occupation: stocking  Tobacco Use   Smoking status: Never   Smokeless tobacco: Never  Vaping Use   Vaping Use: Never used  Substance and  Sexual Activity   Alcohol use: Yes    Comment: occasional   Drug use: No   Sexual activity: Not Currently  Other Topics Concern   Not on file  Social History Narrative   Not on file   Social Determinants of Health   Financial Resource Strain: Not on file  Food Insecurity: Not on file  Transportation Needs: Not on file  Physical Activity: Not on file  Stress: Not on file  Social Connections: Not on file    Vitals:   12/12/21 0656  BP: 124/80  Pulse: 69  Resp: 16  Temp: 98 F (36.7 C)  SpO2: 97%   Body mass index is 35.71 kg/m.  Wt Readings from Last 3 Encounters:  12/12/21 189 lb (85.7 kg)  12/09/21 189 lb (85.7 kg)  11/28/21 187 lb 6.4 oz (85 kg)     Physical Exam Vitals and nursing note reviewed.  Constitutional:      Appearance: Normal  appearance. She is normal weight.  HENT:     Head: Normocephalic.     Right Ear: Tympanic membrane, ear canal and external ear normal.     Left Ear: Tympanic membrane, ear canal and external ear normal.     Nose: Nose normal.     Mouth/Throat:     Mouth: Mucous membranes are moist.     Pharynx: Oropharynx is clear.  Eyes:     Extraocular Movements: Extraocular movements intact.     Conjunctiva/sclera:     Right eye: Right conjunctiva is injected.     Left eye: Left conjunctiva is injected.     Pupils: Pupils are equal, round, and reactive to light.     Comments: Mild conjunctival erythema, just can back from work, 3rd shift.  Cardiovascular:     Rate and Rhythm: Normal rate and regular rhythm.     Pulses: Normal pulses.     Heart sounds: Normal heart sounds.  Pulmonary:     Effort: Pulmonary effort is normal.     Breath sounds: Normal breath sounds.  Abdominal:     General: Abdomen is flat. Bowel sounds are normal.     Palpations: Abdomen is soft.  Genitourinary:    Comments: Deferred to gyn. Musculoskeletal:        General: Normal range of motion.     Cervical back: Normal range of motion and neck supple.   Skin:    General: Skin is warm.  Neurological:     Mental Status: She is alert and oriented to person, place, and time.   ASSESSMENT AND PLAN:  Ms. RAZIAH GRELLE was here today annual physical examination.  Orders Placed This Encounter  Procedures   Lipid panel   Basic Metabolic Panel   Hemoglobin A1c   Vitamin B12   Vitamin D, 25-hydroxy   C-reactive protein   Sedimentation rate   Lab Results  Component Value Date   CRP <1.0 12/12/2021   Lab Results  Component Value Date   ESRSEDRATE 11 12/12/2021   Lab Results  Component Value Date   CREATININE 0.68 12/12/2021   BUN 16 12/12/2021   NA 140 12/12/2021   K 3.2 (L) 12/12/2021   CL 99 12/12/2021   CO2 32 12/12/2021   Lab Results  Component Value Date   CHOL 237 (H) 12/12/2021   HDL 87.10 12/12/2021   LDLCALC 119 (H) 12/12/2021   LDLDIRECT 150.4 10/21/2013   TRIG 156.0 (H) 12/12/2021   CHOLHDL 3 12/12/2021   Lab Results  Component Value Date   HGBA1C 5.9 12/12/2021   Lab Results  Component Value Date   VITAMINB12 510 12/12/2021   Routine general medical examination at a health care facility We discussed the importance of regular physical activity and healthy diet for prevention of chronic illness and/or complications. Preventive guidelines reviewed. Vaccination up to date. Having colonoscopy on 12/29/21. Ca++ and vit D supplementation to continue. Next CPE in a year.  The 10-year ASCVD risk score (Arnett DK, et al., 2019) is: 5.6%   Values used to calculate the score:     Age: 15 years     Sex: Female     Is Non-Hispanic African American: No     Diabetic: No     Tobacco smoker: No     Systolic Blood Pressure: 124 mmHg     Is BP treated: Yes     HDL Cholesterol: 87.1 mg/dL     Total Cholesterol: 237 mg/dL  Screening for endocrine, metabolic  and immunity disorder -     Hemoglobin A1c -     Basic Metabolic Panel  Hyperlipidemia Continue non pharmacologic treatment for now. We discussed  recommendations for pharmacologic treatment. Further recommendations according to FLP result.  Vitamin D deficiency, unspecified Continue current dose of vit D supplementation. Further recommendation will be given according to 25 OH vitamin D result.  PMR (polymyalgia rheumatica) (HCC) Symptoms are well controlled. Will try to decrease Prednisone dose depending of lab results.  B12 deficiency Continue B12 1000 mcg daily. Further recommendations according to B12 results.  Atherosclerosis of aorta [facility]) Discussed vascular finding of PET scan in 12/2020. She is not on statin at this time. Recommendations will be given after FLP results are back.  Bilateral leg edema Problem is stable. Continue HCTZ 25 mg daily as needed.  Return in 6 months (on 06/14/2022).  Nadyne Gariepy G. [place], MD  [facility]. [place] office.

## 2021-12-09 NOTE — Progress Notes (Signed)

## 2021-12-12 ENCOUNTER — Encounter: Payer: Self-pay | Admitting: Family Medicine

## 2021-12-12 ENCOUNTER — Ambulatory Visit (INDEPENDENT_AMBULATORY_CARE_PROVIDER_SITE_OTHER): Payer: No Typology Code available for payment source | Admitting: Family Medicine

## 2021-12-12 VITALS — BP 124/80 | HR 69 | Temp 98.0°F | Resp 16 | Ht 61.0 in | Wt 189.0 lb

## 2021-12-12 DIAGNOSIS — M353 Polymyalgia rheumatica: Secondary | ICD-10-CM

## 2021-12-12 DIAGNOSIS — E785 Hyperlipidemia, unspecified: Secondary | ICD-10-CM

## 2021-12-12 DIAGNOSIS — E538 Deficiency of other specified B group vitamins: Secondary | ICD-10-CM | POA: Diagnosis not present

## 2021-12-12 DIAGNOSIS — Z130 Encounter for screening for diseases of the blood and blood-forming organs and certain disorders involving the immune mechanism: Secondary | ICD-10-CM

## 2021-12-12 DIAGNOSIS — Z1329 Encounter for screening for other suspected endocrine disorder: Secondary | ICD-10-CM | POA: Diagnosis not present

## 2021-12-12 DIAGNOSIS — E559 Vitamin D deficiency, unspecified: Secondary | ICD-10-CM

## 2021-12-12 DIAGNOSIS — R600 Localized edema: Secondary | ICD-10-CM | POA: Diagnosis not present

## 2021-12-12 DIAGNOSIS — Z13228 Encounter for screening for other metabolic disorders: Secondary | ICD-10-CM

## 2021-12-12 DIAGNOSIS — Z0000 Encounter for general adult medical examination without abnormal findings: Secondary | ICD-10-CM

## 2021-12-12 DIAGNOSIS — I700 Atherosclerosis of aorta: Secondary | ICD-10-CM | POA: Diagnosis not present

## 2021-12-12 DIAGNOSIS — E876 Hypokalemia: Secondary | ICD-10-CM

## 2021-12-12 LAB — SEDIMENTATION RATE: Sed Rate: 11 mm/hr (ref 0–30)

## 2021-12-12 LAB — BASIC METABOLIC PANEL
BUN: 16 mg/dL (ref 6–23)
CO2: 32 mEq/L (ref 19–32)
Calcium: 9.6 mg/dL (ref 8.4–10.5)
Chloride: 99 mEq/L (ref 96–112)
Creatinine, Ser: 0.68 mg/dL (ref 0.40–1.20)
GFR: 92.15 mL/min (ref >60.00)
Glucose, Bld: 81 mg/dL (ref 70–99)
Potassium: 3.2 mEq/L — ABNORMAL LOW (ref 3.5–5.1)
Sodium: 140 mEq/L (ref 135–145)

## 2021-12-12 LAB — VITAMIN B12: Vitamin B-12: 510 pg/mL (ref 211–911)

## 2021-12-12 LAB — LIPID PANEL
Cholesterol: 237 mg/dL — ABNORMAL HIGH (ref 0–200)
HDL: 87.1 mg/dL (ref >39.00)
LDL Cholesterol: 119 mg/dL — ABNORMAL HIGH (ref 0–99)
NonHDL: 150.18
Total CHOL/HDL Ratio: 3
Triglycerides: 156 mg/dL — ABNORMAL HIGH (ref 0.0–149.0)
VLDL: 31.2 mg/dL (ref 0.0–40.0)

## 2021-12-12 LAB — C-REACTIVE PROTEIN: CRP: <1 mg/dL (ref 0.5–20.0)

## 2021-12-12 LAB — VITAMIN D 25 HYDROXY (VIT D DEFICIENCY, FRACTURES): VITD: 33.26 ng/mL (ref 30.00–100.00)

## 2021-12-12 LAB — HEMOGLOBIN A1C: Hgb A1c MFr Bld: 5.9 % (ref 4.6–6.5)

## 2021-12-12 MED ORDER — PREDNISONE 10 MG PO TABS: 15.0000 mg | ORAL_TABLET | Freq: Every day | ORAL | 0 refills | Status: DC

## 2021-12-12 MED ORDER — POTASSIUM CHLORIDE CRYS ER 20 MEQ PO TBCR: 20.0000 meq | EXTENDED_RELEASE_TABLET | Freq: Every day | ORAL | 3 refills | Status: DC

## 2021-12-12 NOTE — Assessment & Plan Note (Signed)
Discussed vascular finding of PET scan in 12/2020. ?She is not on statin at this time. ?Recommendations will be given after FLP results are back. ?

## 2021-12-12 NOTE — Assessment & Plan Note (Addendum)
Continue current dose of vit D supplementation. ?Further recommendation will be given according to 25 OH vitamin D result. ?

## 2021-12-12 NOTE — Assessment & Plan Note (Signed)
Continue B12 1000 mcg daily. ?Further recommendations according to B12 results. ?

## 2021-12-12 NOTE — Assessment & Plan Note (Signed)
Symptoms are well controlled. ?Will try to decrease Prednisone dose depending of lab results. ?

## 2021-12-12 NOTE — Patient Instructions (Addendum)
A few things to remember from today's visit: ? ? ?Routine general medical examination at a health care facility ? ?Hyperlipidemia, unspecified hyperlipidemia type - Plan: Lipid panel ? ?B12 deficiency - Plan: Vitamin B12 ? ?Vitamin D deficiency, unspecified - Plan: Vitamin D, 25-hydroxy ? ?PMR (polymyalgia rheumatica) (HCC) - Plan: C-reactive protein, Sedimentation rate ? ?Atherosclerosis of aorta ([place]) - Plan: Lipid panel ? ?Screening for endocrine, metabolic and immunity disorder - Plan: Basic Metabolic Panel, Hemoglobin A1c ? ?If you need refills please call your pharmacy. ?Do not use My Chart to request refills or for acute issues that need immediate attention. ?  ? ?Please be sure medication list is accurate. ?If a new problem present, please set up appointment sooner than planned today. ? ? ? ?Health Maintenance, Female ?Adopting a healthy lifestyle and getting preventive care are important in promoting health and wellness. Ask your health care provider about: ?The right schedule for you to have regular tests and exams. ?Things you can do on your own to prevent diseases and keep yourself healthy. ?What should I know about diet, weight, and exercise? ?Eat a healthy diet ? ?Eat a diet that includes plenty of vegetables, fruits, low-fat dairy products, and lean protein. ?Do not eat a lot of foods that are high in solid fats, added sugars, or sodium. ?Maintain a healthy weight ?Body mass index (BMI) is used to identify weight problems. It estimates body fat based on height and weight. Your health care provider can help determine your BMI and help you achieve or maintain a healthy weight. ?Get regular exercise ?Get regular exercise. This is one of the most important things you can do for your health. Most adults should: ?Exercise for at least 150 minutes each week. The exercise should increase your heart rate and make you sweat (moderate-intensity exercise). ?Do strengthening exercises at least twice a week. This  is in addition to the moderate-intensity exercise. ?Spend less time sitting. Even light physical activity can be beneficial. ?Watch cholesterol and blood lipids ?Have your blood tested for lipids and cholesterol at 65 years of age, then have this test every 5 years. ?Have your cholesterol levels checked more often if: ?Your lipid or cholesterol levels are high. ?You are older than 65 years of age. ?You are at high risk for heart disease. ?What should I know about cancer screening? ?Depending on your health history and family history, you may need to have cancer screening at various ages. This may include screening for: ?Breast cancer. ?Cervical cancer. ?Colorectal cancer. ?Skin cancer. ?Lung cancer. ?What should I know about heart disease, diabetes, and high blood pressure? ?Blood pressure and heart disease ?High blood pressure causes heart disease and increases the risk of stroke. This is more likely to develop in people who have high blood pressure readings or are overweight. ?Have your blood pressure checked: ?Every 3-5 years if you are 40-8 years of age. ?Every year if you are 21 years old or older. ?Diabetes ?Have regular diabetes screenings. This checks your fasting blood sugar level. Have the screening done: ?Once every three years after age 77 if you are at a normal weight and have a low risk for diabetes. ?More often and at a younger age if you are overweight or have a high risk for diabetes. ?What should I know about preventing infection? ?Hepatitis B ?If you have a higher risk for hepatitis B, you should be screened for this virus. Talk with your health care provider to find out if you are at risk  for hepatitis B infection. ?Hepatitis C ?Testing is recommended for: ?Everyone born from [birth year] through 1964. ?Anyone with known risk factors for hepatitis C. ?Sexually transmitted infections (STIs) ?Get screened for STIs, including gonorrhea and chlamydia, if: ?You are sexually active and are younger than 65  years of age. ?You are older than 65 years of age and your health care provider tells you that you are at risk for this type of infection. ?Your sexual activity has changed since you were last screened, and you are at increased risk for chlamydia or gonorrhea. Ask your health care provider if you are at risk. ?Ask your health care provider about whether you are at high risk for HIV. Your health care provider may recommend a prescription medicine to help prevent HIV infection. If you choose to take medicine to prevent HIV, you should first get tested for HIV. You should then be tested every 3 months for as long as you are taking the medicine. ?Pregnancy ?If you are about to stop having your period (premenopausal) and you may become pregnant, seek counseling before you get pregnant. ?Take 400 to 800 micrograms (mcg) of folic acid every day if you become pregnant. ?Ask for birth control (contraception) if you want to prevent pregnancy. ?Osteoporosis and menopause ?Osteoporosis is a disease in which the bones lose minerals and strength with aging. This can result in bone fractures. If you are 27 years old or older, or if you are at risk for osteoporosis and fractures, ask your health care provider if you should: ?Be screened for bone loss. ?Take a calcium or vitamin D supplement to lower your risk of fractures. ?Be given hormone replacement therapy (HRT) to treat symptoms of menopause. ?Follow these instructions at home: ?Alcohol use ?Do not drink alcohol if: ?Your health care provider tells you not to drink. ?You are pregnant, may be pregnant, or are planning to become pregnant. ?If you drink alcohol: ?Limit how much you have to: ?0-1 drink a day. ?Know how much alcohol is in your drink. In the [place] one drink equals one 12 oz bottle of beer (355 mL), one 5 oz glass of wine (148 mL), or one 1? oz glass of hard liquor (44 mL). ?Lifestyle ?Do not use any products that contain nicotine or tobacco. These products include  cigarettes, chewing tobacco, and vaping devices, such as e-cigarettes. If you need help quitting, ask your health care provider. ?Do not use street drugs. ?Do not share needles. ?Ask your health care provider for help if you need support or information about quitting drugs. ?General instructions ?Schedule regular health, dental, and eye exams. ?Stay current with your vaccines. ?Tell your health care provider if: ?You often feel depressed. ?You have ever been abused or do not feel safe at home. ?Summary ?Adopting a healthy lifestyle and getting preventive care are important in promoting health and wellness. ?Follow your health care provider's instructions about healthy diet, exercising, and getting tested or screened for diseases. ?Follow your health care provider's instructions on monitoring your cholesterol and blood pressure. ?This information is not intended to replace advice given to you by your health care provider. Make sure you discuss any questions you have with your health care provider. ?Document Revised: 01/31/2021 Document Reviewed: 01/31/2021 ?Elsevier Patient Education ? [place]. ? ?

## 2021-12-12 NOTE — Assessment & Plan Note (Signed)
Continue non pharmacologic treatment for now. We discussed recommendations for pharmacologic treatment. ?Further recommendations according to FLP result. ?

## 2021-12-12 NOTE — Assessment & Plan Note (Signed)
Problem is stable. ?Continue HCTZ 25 mg daily as needed. ?

## 2021-12-18 ENCOUNTER — Encounter: Payer: Self-pay | Admitting: Family Medicine

## 2021-12-20 ENCOUNTER — Telehealth: Payer: Self-pay | Admitting: Gastroenterology

## 2021-12-20 NOTE — Telephone Encounter (Signed)
Inbound call from patient would like a call or mychart message to discuss medication she have now started Klor-con mg20 if she should take medication before procedure. 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 Would the day before be considered 12 midnight or at 6am when most people wake up? ? ?Patient states can send a mychart message  ?

## 2021-12-20 NOTE — Telephone Encounter (Signed)
My chart message to pt answering her questions Ariel Rios PV  ?

## 2021-12-24 ENCOUNTER — Encounter: Payer: Self-pay | Admitting: Gastroenterology

## 2021-12-29 ENCOUNTER — Encounter: Payer: Self-pay | Admitting: Gastroenterology

## 2021-12-29 ENCOUNTER — Ambulatory Visit (AMBULATORY_SURGERY_CENTER): Payer: No Typology Code available for payment source | Admitting: Gastroenterology

## 2021-12-29 VITALS — BP 110/63 | HR 59 | Temp 97.8°F | Resp 14 | Ht 62.0 in | Wt 189.0 lb

## 2021-12-29 DIAGNOSIS — D122 Benign neoplasm of ascending colon: Secondary | ICD-10-CM

## 2021-12-29 DIAGNOSIS — D128 Benign neoplasm of rectum: Secondary | ICD-10-CM | POA: Diagnosis not present

## 2021-12-29 DIAGNOSIS — Z1211 Encounter for screening for malignant neoplasm of colon: Secondary | ICD-10-CM | POA: Diagnosis present

## 2021-12-29 DIAGNOSIS — D123 Benign neoplasm of transverse colon: Secondary | ICD-10-CM

## 2021-12-29 MED ORDER — SODIUM CHLORIDE 0.9 % IV SOLN: 500.0000 mL | Freq: Once | INTRAVENOUS | Status: DC

## 2021-12-29 NOTE — Progress Notes (Signed)
Pt's states no medical or surgical changes since previsit or office visit. 

## 2021-12-29 NOTE — Progress Notes (Signed)
Pt non-responsive, VVS, Report to RN  °

## 2021-12-29 NOTE — Op Note (Signed)
[place] ?Patient Name: Ariel Rios ?Procedure Date: 12/29/2021 10:24 AM ?MRN: [id number] ?Endoscopist: Shahzaib Azevedo E. Candis Schatz , MD ?Age: 66 ?Referring MD:  ?Date of Birth: 09/25/1956 ?Gender: Female ?Account #: [id number] ?Procedure:                Colonoscopy ?Indications:              Screening for colorectal malignant neoplasm (last  ?                          colonoscopy was more than 10 years ago) ?Medicines:                Monitored Anesthesia Care ?Procedure:                Pre-Anesthesia Assessment: ?                          - Prior to the procedure, a History and Physical  ?                          was performed, and patient medications and  ?                          allergies were reviewed. The patient's tolerance of  ?                          previous anesthesia was also reviewed. The risks  ?                          and benefits of the procedure and the sedation  ?                          options and risks were discussed with the patient.  ?                          All questions were answered, and informed consent  ?                          was obtained. Prior Anticoagulants: The patient has  ?                          taken no previous anticoagulant or antiplatelet  ?                          agents. ASA Grade Assessment: II - A patient with  ?                          mild systemic disease. After reviewing the risks  ?                          and benefits, the patient was deemed in  ?                          satisfactory condition to undergo the procedure. ?  After obtaining informed consent, the colonoscope  ?                          was passed under direct vision. Throughout the  ?                          procedure, the patient's blood pressure, pulse, and  ?                          oxygen saturations were monitored continuously. The  ?                          Olympus PCF-H190DL (XN#[id number]) Colonoscope was  ?                          introduced  through the anus and advanced to the the  ?                          terminal ileum, with identification of the  ?                          appendiceal orifice and IC valve. The colonoscopy  ?                          was technically difficult and complex due to  ?                          multiple diverticula in the colon, restricted  ?                          mobility of the colon and a tortuous colon.  ?                          Successful completion of the procedure was aided by  ?                          using manual pressure. The patient tolerated the  ?                          procedure well. The quality of the bowel  ?                          preparation was adequate. The terminal ileum,  ?                          ileocecal valve, appendiceal orifice, and rectum  ?                          were photographed. The bowel preparation used was  ?                          SUPREP via split dose instruction. ?Findings:                 The perianal and  digital rectal examinations were  ?                          normal. Pertinent negatives include normal  ?                          sphincter tone and no palpable rectal lesions. ?                          Two sessile polyps were found in the ascending  ?                          colon. The polyps were 2 to 3 mm in size. These  ?                          polyps were removed with a cold snare. Resection  ?                          and retrieval were complete. Estimated blood loss  ?                          was minimal. ?                          A 2 mm polyp was found in the hepatic flexure on  ?                          the edge of a diverticulum. The polyp was sessile.  ?                          The polyp was removed with a cold biopsy forceps.  ?                          Resection and retrieval were complete. Estimated  ?                          blood loss was minimal. ?                          A 5 mm polyp was found in the rectum. The polyp was  ?                           sessile. The polyp was removed with a cold snare.  ?                          Resection and retrieval were complete. Estimated  ?                          blood loss was minimal. ?                          Many small and large-mouthed diverticula were found  ?  in the sigmoid colon, descending colon, transverse  ?                          colon and ascending colon. There was narrowing of  ?                          the colon in association with the diverticular  ?                          opening. There was evidence of an impacted  ?                          diverticulum. ?                          The exam was otherwise normal throughout the  ?                          examined colon. ?                          The terminal ileum appeared normal. ?                          The retroflexed view of the distal rectum and anal  ?                          verge was normal and showed no anal or rectal  ?                          abnormalities. ?Complications:            No immediate complications. ?Estimated Blood Loss:     Estimated blood loss was minimal. ?Impression:               - Two 2 to 3 mm polyps in the ascending colon,  ?                          removed with a cold snare. Resected and retrieved. ?                          - One 2 mm polyp at the hepatic flexure, removed  ?                          with a cold biopsy forceps. Resected and retrieved. ?                          - One 5 mm polyp in the rectum, removed with a cold  ?                          snare. Resected and retrieved. ?                          - Severe diverticulosis in the sigmoid colon, in  ?  the descending colon, in the transverse colon and  ?                          in the ascending colon. There was narrowing of the  ?                          colon in association with the diverticular opening.  ?                          There was evidence of an impacted diverticulum. ?                           - The examined portion of the ileum was normal. ?                          - The distal rectum and anal verge are normal on  ?                          retroflexion view. ?Recommendation:           - Patient has a contact number available for  ?                          emergencies. The signs and symptoms of potential  ?                          delayed complications were discussed with the  ?                          patient. Return to normal activities tomorrow.  ?                          Written discharge instructions were provided to the  ?                          patient. ?                          - Resume previous diet. ?                          - Continue present medications. ?                          - Await pathology results. ?                          - Repeat colonoscopy (date not yet determined) for  ?                          surveillance based on pathology results. ?                          - Recommend high fiber diet or fiber  ?  supplementation to reduce risk of diverticular  ?                          complications (bleeding, diverticulitis) ?Shatavia Santor E. Candis Schatz, MD ?12/29/2021 10:34:07 AM ?This report has been signed electronically. ?

## 2021-12-29 NOTE — Progress Notes (Signed)
[facility] History and Physical ? ? ?Primary Care Physician:  [name], [name], MD ? ? ?Reason for Procedure:   Colon cancer screening ? ?Plan:    Screening colonoscopy ? ? ? ? ?HPI: Ariel Rios is a 66 y.o. female undergoing average risk screening colonoscopy.  She has no family history of colon cancer and no chronic GI symptoms.  She underwent a colonoscopy in 2009 in which no polyps were found, but she was noted to have severe diverticulosis. ? ? ?Past Medical History:  ?Diagnosis Date  ? Anemia   ? BMI 34.0-34.9,adult   ? Family history of melanoma   ? Melanotic macule of vulvar labia   ? Migraine   ? Polymyalgia rheumatica ([place])   ? Venous insufficiency   ? ? ?No past surgical history on file. ? ?Prior to Admission medications   ?Medication Sig Start Date End Date Taking? Authorizing Provider  ?hydrochlorothiazide (HYDRODIURIL) 25 MG tablet TAKE 1 TABLET (25 MG TOTAL) BY MOUTH DAILY AS NEEDED. 05/25/21   [name], [name], MD  ?Multiple Vitamins-Minerals (MULTIVITAMIN WITH MINERALS) tablet Take 1 tablet by mouth daily.    [provider]  ?potassium chloride SA (KLOR-CON M) 20 MEQ tablet Take 1 tablet (20 mEq total) by mouth daily. 12/12/21   [name], [name], MD  ?predniSONE (DELTASONE) 10 MG tablet Take 1.5 tablets (15 mg total) by mouth daily with breakfast. 1.5 tab daily 12/12/21   [name], [name], MD  ?vitamin B-12 (CYANOCOBALAMIN) 1000 MCG tablet Take by mouth every other day.    [provider]  ?VITAMIN D, CHOLECALCIFEROL, PO Take 2,000 Int'l Units/1.15m by mouth.    [provider]  ?prochlorperazine (COMPAZINE) 10 MG tablet Take 1 tablet (10 mg total) by mouth every 6 (six) hours as needed for nausea or vomiting. ?Patient not taking: Reported on 03/07/2021 02/07/21 05/02/21  GHeath Lark MD  ? ? ?Current Outpatient Medications  ?Medication Sig Dispense Refill  ? hydrochlorothiazide (HYDRODIURIL) 25 MG tablet TAKE 1 TABLET (25 MG TOTAL) BY MOUTH DAILY AS NEEDED. 90  tablet 3  ? Multiple Vitamins-Minerals (MULTIVITAMIN WITH MINERALS) tablet Take 1 tablet by mouth daily.    ? potassium chloride SA (KLOR-CON M) 20 MEQ tablet Take 1 tablet (20 mEq total) by mouth daily. 30 tablet 3  ? predniSONE (DELTASONE) 10 MG tablet Take 1.5 tablets (15 mg total) by mouth daily with breakfast. 1.5 tab daily 135 tablet 0  ? vitamin B-12 (CYANOCOBALAMIN) 1000 MCG tablet Take by mouth every other day.    ? VITAMIN D, CHOLECALCIFEROL, PO Take 2,000 Int'l Units/1.730mby mouth.    ? ?Current Facility-Administered Medications  ?Medication Dose Route Frequency Provider Last Rate Last Admin  ? 0.9 %  sodium chloride infusion  500 mL Intravenous Once CuDaryel NovemberMD      ? ? ?Allergies as of 12/29/2021  ? (No Known Allergies)  ? ? ?Family History  ?Problem Relation Age of Onset  ? Diabetes Mother   ? AVM Father   ? Melanoma Father   ?     multiple melanomas dx between [id number]? Cancer Father 9225?     tumor on kidney  ? Cancer Paternal Grandfather   ?     laryngeal cancer  ? Cancer Daughter 3179?     appendiceal cancer  ? Breast cancer Neg Hx   ? Ovarian cancer Neg Hx   ? Colon cancer Neg Hx   ? Uterine cancer  Neg Hx   ? Colon polyps Neg Hx   ? Esophageal cancer Neg Hx   ? Stomach cancer Neg Hx   ? Rectal cancer Neg Hx   ? ? ?Social History  ? ?Socioeconomic History  ? Marital status: Married  ?  Spouse name: Not on file  ? Number of children: 2  ? Years of education: Not on file  ? Highest education level: Not on file  ?Occupational History  ? Occupation: shelf clerk  ? Occupation: stocking  ?Tobacco Use  ? Smoking status: Never  ? Smokeless tobacco: Never  ?Vaping Use  ? Vaping Use: Never used  ?Substance and Sexual Activity  ? Alcohol use: Yes  ?  Comment: occasional  ? Drug use: No  ? Sexual activity: Not Currently  ?Other Topics Concern  ? Not on file  ?Social History Narrative  ? Not on file  ? ?Social Determinants of Health  ? ?Financial Resource Strain: Not on file  ?Food Insecurity: Not  on file  ?Transportation Needs: Not on file  ?Physical Activity: Not on file  ?Stress: Not on file  ?Social Connections: Not on file  ?Intimate Partner Violence: Not on file  ? ? ?Review of Systems: ? ?All other review of systems negative except as mentioned in the HPI. ? ?Physical Exam: ?Vital signs ?BP 139/78   Pulse 66   Temp 97.8 ?F (36.6 ?C) (Skin)   Ht '5\' 2"'$  (1.575 m)   Wt 189 lb (85.7 kg)   SpO2 98%   BMI 34.57 kg/m?  ? ?General:   Alert,  Well-developed, well-nourished, pleasant and cooperative in NAD ?Airway:  Mallampati 2 ?Lungs:  Clear throughout to auscultation.   ?Heart:  Regular rate and rhythm; no murmurs, clicks, rubs,  or gallops. ?Abdomen:  Soft, nontender and nondistended. Normal bowel sounds.   ?Neuro/Psych:  Normal mood and affect. A and O x 3 ? ? ?Amiel Sharrow E. Candis Schatz, MD ?[facility] ? ?

## 2021-12-29 NOTE — Progress Notes (Signed)
Called to room to assist during endoscopic procedure.  Patient ID and intended procedure confirmed with present staff. Received instructions for my participation in the procedure from the performing physician.  

## 2021-12-29 NOTE — Patient Instructions (Signed)

## 2022-01-03 ENCOUNTER — Telehealth: Payer: Self-pay

## 2022-01-03 ENCOUNTER — Telehealth: Payer: Self-pay | Admitting: *Deleted

## 2022-01-03 NOTE — Telephone Encounter (Signed)
First attempt, left VM.  

## 2022-01-03 NOTE — Telephone Encounter (Signed)
?  Follow up Call- ? ? ?  12/29/2021  ?  9:29 AM  ?Call back number  ?Post procedure Call Back phone  # [phone]  ?Permission to leave phone message Yes  ?  ? ?Patient questions: ? ?Do you have a fever, pain , or abdominal swelling? No. ?Pain Score  0 * ? ?Have you tolerated food without any problems? Yes.   ? ?Have you been able to return to your normal activities? Yes.   ? ?Do you have any questions about your discharge instructions: ?Diet   No. ?Medications  No. ?Follow up visit  No. ? ?Do you have questions or concerns about your Care? No. ? ?Actions: ?* If pain score is 4 or above: ?No action needed, pain <4. ? ? ?

## 2022-01-04 ENCOUNTER — Other Ambulatory Visit (INDEPENDENT_AMBULATORY_CARE_PROVIDER_SITE_OTHER): Payer: No Typology Code available for payment source

## 2022-01-04 DIAGNOSIS — E876 Hypokalemia: Secondary | ICD-10-CM | POA: Diagnosis not present

## 2022-01-04 LAB — POTASSIUM: Potassium: 4 mEq/L (ref 3.5–5.1)

## 2022-01-07 NOTE — Progress Notes (Signed)
Ariel Rios, ? ?The polyps that I removed during your recent procedure were completely benign but were proven to be "pre-cancerous" polyps that MAY have grown into cancers if they had not been removed.  Studies shows that at least 20% of women over age 65 and 30% of men over age 61 have pre-cancerous polyps.  Based on current nationally recognized surveillance guidelines, I recommend that you have a repeat colonoscopy in 3 years.  ? ?If you develop any new rectal bleeding, abdominal pain or significant bowel habit changes, please contact me before then. ? ?

## 2022-01-09 ENCOUNTER — Encounter: Payer: Self-pay | Admitting: Gastroenterology

## 2022-02-18 ENCOUNTER — Other Ambulatory Visit: Payer: Self-pay | Admitting: Family Medicine

## 2022-02-18 DIAGNOSIS — E876 Hypokalemia: Secondary | ICD-10-CM

## 2022-02-23 ENCOUNTER — Telehealth: Payer: Self-pay

## 2022-02-23 DIAGNOSIS — M353 Polymyalgia rheumatica: Secondary | ICD-10-CM

## 2022-02-23 NOTE — Telephone Encounter (Signed)
---  Caller states she pulled a muscle in her R upper leg at work a couple of weeks ago and lifted something heavy. By now she is having great pain and walking difficulty. Ibuprofen isn't helping now.  02/22/2022 9:07:08 AM See HCP within 4 Hours (or PCP triage) Fredderick Phenix, RN, Marie  Referrals [facility] UNDECIDED  02/23/22 1156- Pt states she went to UC yesterday. Was given muscle relaxer & prescription pain medication. Pt states she having trouble walking is able to walk, squat, & do all other movements. Pt states injury happened 2-3 weeks ago. She will continue recommended management per UC & f/u with PCP if needed.   While on phone pt mentioned that she will need a refill of Prednisone '10mg'$  as she has now gotten the daily dose down to '10mg'$  as instructed from 12/12/21 OV & result notes. Pt is requesting refill. Will send to PCP for approval.

## 2022-02-24 ENCOUNTER — Encounter: Payer: Self-pay | Admitting: Gynecologic Oncology

## 2022-02-24 MED ORDER — PREDNISONE 10 MG PO TABS: 10.0000 mg | ORAL_TABLET | Freq: Every day | ORAL | 0 refills | Status: DC

## 2022-02-24 NOTE — Telephone Encounter (Signed)
Rx sent in per result note directions.

## 2022-02-24 NOTE — Addendum Note (Signed)
Addended by: Rodrigo Ran on: 02/24/2022 08:37 AM   Modules accepted: Orders

## 2022-03-02 ENCOUNTER — Encounter: Payer: Self-pay | Admitting: Gynecologic Oncology

## 2022-03-02 ENCOUNTER — Other Ambulatory Visit: Payer: Self-pay

## 2022-03-02 ENCOUNTER — Inpatient Hospital Stay: Payer: No Typology Code available for payment source | Attending: Gynecologic Oncology | Admitting: Gynecologic Oncology

## 2022-03-02 VITALS — BP 138/90 | HR 81 | Temp 98.0°F | Resp 16 | Ht 62.0 in | Wt 182.4 lb

## 2022-03-02 DIAGNOSIS — Z9221 Personal history of antineoplastic chemotherapy: Secondary | ICD-10-CM | POA: Diagnosis not present

## 2022-03-02 DIAGNOSIS — Z8544 Personal history of malignant neoplasm of other female genital organs: Secondary | ICD-10-CM | POA: Diagnosis present

## 2022-03-02 DIAGNOSIS — C519 Malignant neoplasm of vulva, unspecified: Secondary | ICD-10-CM

## 2022-03-02 NOTE — Patient Instructions (Signed)
I will see you back for follow-up in 3 months.  I had like to get an ultrasound of your groin now and we will plan to get a CT scan for monitoring your cancer at your next follow-up visit.  If you develop any new and concerning symptoms before that visit, please call to see me sooner.

## 2022-03-02 NOTE — Progress Notes (Signed)
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  She denies pain   12/07/2020 Initial Biopsy   FINAL MICROSCOPIC DIAGNOSIS:   A.   RIGHT VULVA, BIOPSY: PRIMARY MALIGNANT MELANOMA OF VULVA, MELANOMA  TABLE BELOW  MALIGNANT MELANOMA (AJCC 8TH EDITION):  PROCEDURE: BIOPSY  SPECIMEN ANATOMIC SITE: RIGHT VULVA  BRESLOW'S DEPTH/MAXIMUM TUMOR THICKNESS: 5.05 MM  CLARK/ANATOMIC LEVEL* IV  MARGINS:  PERIPHERAL: INVOLVED  DEEP: FREE  ULCERATION: PRESENT  MITOTIC INDEX: 11/mm2  LYMPHO-VASCULAR INVASION: PRESENT*  NEUTROTROPISM: ABSENT  TUMOR-INFILTRATING LYMPHOCYTES: NON-BRISK  LYMPH NODES: N/A  PATHOLOGIC STAGE: PT4B NX MX  *VULVAR MELANOMAS MAY NOT HAVE THE SAME BEHAVIOR STAGE FOR STAGE AS SUPERFICIAL SPREADING CUTANEOUS MELANOMA, AND LANDMARKS SUCH AS BRESLOW'S DEPTH MAY NOT APPLY. NONETHELESS, THIS SHOULD BE TREATED AS A PT4B MELANOMA AND COMPLETE EXCISION, WITH POSSIBLE LYMPH NODE EVALUATION, UNDERTAKEN. SEE COMMENT FOR IHC PROFILE.    COMMENT:  Sections show confluent and nested growth of atypical melanocytes along vulvar epithelium in an in-situ melanoma growth pattern at the periphery, with a central large ulcerated nodule of similar atypical  melanocytes, consonant with primary vulvar melanoma. Notably, IHC was performed to evaluate the lineage and is diagnostic for melanoma. Lesional melanocytes are positive with S100, MelanA, and show a high ki-67 uptake, and are negative with CDX-2, synaptophysin, CK20, TTF-1, CK7, CD56, and lymphoid markers CD5, CD3, CD20 highlight only background lymphocytes.    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 The only potential hypermetabolic finding of note is a small flat focus of accentuated metabolic activity along the anterior vaginal vestibule/perineum, maximum SUV 6.5. Most common cause for this type of appearance would be a trace amount of incontinence of urinary FDG, but given the proximity to the reported vulvar melanoma, this small focus is considered indeterminate. 2. Other imaging findings of potential clinical significance: Aortic Atherosclerosis (ICD10-I70.0). Small type 1 hiatal hernia. Palatine tonsilloliths. Colonic diverticulosis.   01/18/2021 Surgery   Preoperative Diagnosis: Vulvar melanoma  Postoperative Diagnosis: Same  Procedures performed: Wide radical vulvectomy, R inguinofemoral sentinel LN biopsy  Attending Surgeon: [name], MD  Fellow: [name]  Resident: Melony Overly, MD; Darrin Nipper, MD  Anesthesia: General endotracheal  Findings:  1. Vulva with biopsy site changes at R labia minora adjacent to the clitoris. No visible tumor. 2. Inguinofemoral nodes not clinically enlarged or abnormal.   Specimens:  1. R inguinal sentinel LN, hot and blue 2. Portion of vulva 3. Superior deep margin    01/18/2021 Pathology Results   Only melanoma in situ seen in final resection, SLN negative   02/08/2021 - 03/22/2021 Chemotherapy         03/29/2021 Genetic Testing   Negative genetic testing on the Multi-cancer +RNA panel.  The report date is March 29, 2021.  The Multi-Gene Panel offered by [facility] includes sequencing and/or deletion duplication testing of the following 84 genes: AIP, ALK, APC, ATM, AXIN2,BAP1,  BARD1, BLM, BMPR1A, BRCA1, BRCA2, BRIP1, CASR, CDC73, CDH1,  CDK4, CDKN1B, CDKN1C, CDKN2A (p14ARF), CDKN2A (p16INK4a), CEBPA, CHEK2, CTNNA1, DICER1, DIS3L2, EGFR  (c.2369C>T, p.Thr790Met variant only), EPCAM (Deletion/duplication testing only), FH, FLCN, GATA2, GPC3, GREM1 (Promoter region deletion/duplication testing only), HOXB13 (c.251G>A, p.Gly84Glu), HRAS, KIT, MAX, MEN1, MET, MITF (c.952G>A, p.Glu318Lys variant only), MLH1, MSH2, MSH3, MSH6, MUTYH, NBN, NF1, NF2, NTHL1, PALB2, PDGFRA, PHOX2B, PMS2, POLD1, POLE, POT1, PRKAR1A, PTCH1, PTEN, RAD50, RAD51C, RAD51D, RB1, RECQL4, RET, RUNX1, SDHAF2, SDHA (sequence changes only), SDHB, SDHC, SDHD, SMAD4, SMARCA4, SMARCB1, SMARCE1, STK11, SUFU, TERC, TERT, TMEM127, TP53, TSC1, TSC2, VHL, WRN and WT1.     04/29/2021 PET scan   1. No vulvar mass or residual hypermetabolism. 2. No findings suspicious for metastatic disease.     Interval History: Last mammogram was 3/7. She underwent colonoscopy on 4/6.   Patient reports doing well.  She saw her dermatologist around the same time as her last visit with me and sees the dermatologist again at the end of the month.  She picked up a box about a month ago and felt like she pulled a muscle in her buttocks.  She continues to have pain in that area that radiates through her pelvic area and vagina.  She never had any bleeding, but still has some pain with walking.  She denies any burning, pruritus, bleeding, or discharge.  Past Medical/Surgical History: Past Medical History:  Diagnosis Date   Anemia    BMI 34.0-34.9,adult    Family history of melanoma    Melanotic macule of vulvar labia    Migraine    Polymyalgia rheumatica (HCC)    Venous insufficiency     Past Surgical History:  Procedure Laterality Date   COLONOSCOPY      Family History  Problem Relation Age of Onset   Diabetes Mother    AVM Father    Melanoma Father        multiple melanomas dx between 21-90   Cancer Father 4       tumor on kidney   Cancer Paternal Grandfather        laryngeal cancer   Cancer Daughter 46       appendiceal cancer   Breast cancer Neg Hx    Ovarian cancer Neg  Hx    Colon cancer Neg Hx    Uterine cancer Neg Hx    Colon polyps Neg Hx    Esophageal cancer Neg Hx    Stomach cancer Neg Hx    Rectal cancer Neg Hx     Social History   Socioeconomic History   Marital status: Married    Spouse name: Not on file   Number of children: 2   Years of education: Not on file   Highest education level: Not on file  Occupational History   Occupation: shelf clerk   Occupation: stocking  Tobacco Use   Smoking status: Never   Smokeless tobacco: Never  Vaping Use   Vaping Use: Never used  Substance and Sexual Activity   Alcohol use: Yes    Comment: occasional   Drug use: No   Sexual activity: Not Currently  Other Topics Concern   Not on file  Social History Narrative   Not on file   Social Determinants of Health   Financial Resource Strain: Not on file  Food Insecurity: Not on file  Transportation Needs: Not on file  Physical Activity: Not on file  Stress: Not on file  Social Connections: Not on file    Current Medications:  Current Outpatient Medications:  cyclobenzaprine (FEXMID) 7.5 MG tablet, Take 7.5 mg by mouth 3 (three) times daily as needed for muscle spasms., Disp: , Rfl:    diclofenac (CATAFLAM) 50 MG tablet, Take 50 mg by mouth 3 (three) times daily as needed., Disp: , Rfl:    hydrochlorothiazide (HYDRODIURIL) 25 MG tablet, TAKE 1 TABLET (25 MG TOTAL) BY MOUTH DAILY AS NEEDED., Disp: 90 tablet, Rfl: 3   KLOR-CON M20 20 MEQ tablet, TAKE 1 TABLET BY MOUTH EVERY DAY, Disp: 90 tablet, Rfl: 1   Multiple Vitamins-Minerals (MULTIVITAMIN WITH MINERALS) tablet, Take 1 tablet by mouth daily., Disp: , Rfl:    Na Sulfate-K Sulfate-Mg Sulf 17.5-3.13-1.6 GM/177ML SOLN, , Disp: , Rfl:    predniSONE (DELTASONE) 10 MG tablet, Take 1 tablet (10 mg total) by mouth daily with breakfast., Disp: 90 tablet, Rfl: 0  Review of Systems: + muscle pain/cramping Denies appetite changes, fevers, chills, fatigue, unexplained weight changes. Denies  hearing loss, neck lumps or masses, mouth sores, ringing in ears or voice changes. Denies cough or wheezing.  Denies shortness of breath. Denies chest pain or palpitations. Denies leg swelling. Denies abdominal distention, pain, blood in stools, constipation, diarrhea, nausea, vomiting, or early satiety. Denies pain with intercourse, dysuria, frequency, hematuria or incontinence. Denies hot flashes, pelvic pain, vaginal bleeding or vaginal discharge.   Denies joint pain, back pain. Denies itching, rash, or wounds. Denies dizziness, headaches, numbness or seizures. Denies swollen lymph nodes or glands, denies easy bruising or bleeding. Denies anxiety, depression, confusion, or decreased concentration.  Physical Exam: BP 138/90 (BP Location: Right Arm, Patient Position: Sitting)   Pulse 81   Temp 98 F (36.7 C) (Tympanic)   Resp 16   Ht '5\' 2"'  (1.575 m)   Wt 182 lb 6.4 oz (82.7 kg)   SpO2 98%   BMI 33.36 kg/m  General: Alert, oriented, no acute distress. HEENT: Normocephalic, atraumatic, sclera anicteric. Chest: Clear to auscultation bilaterally.  No wheezes or rhonchi. Cardiovascular: Regular rate and rhythm, no murmurs. Abdomen: soft, nontender.  Normoactive bowel sounds.  No masses or hepatosplenomegaly appreciated.   Extremities: Grossly normal range of motion.  Warm, well perfused.  No edema bilaterally. Skin: No rashes or lesions noted. Lymphatics: No cervical, supraclavicular, or inguinal adenopathy. GU: External female genitalia notable for changes from prior surgery along the right vulva.  There is mild discoloration along the most inferior aspect of the incision, similar to her last visit when a biopsy was performed, and just medial to this area.  Picture taken today and placed in media to document this.  No other areas of discoloration, lesions, or atypical vasculature.  On speculum exam, no atypical vascularity, discoloration or masses noted.  No paravaginal nodularity or  nodularity appreciated on rectovaginal exam.  Laboratory & Radiologic Studies: 11/28/21: Vulvar biopsy - fibrosis c/w scar. Small junctional nevus. No melanoma identified.  Assessment & Plan: MALYN AYTES is a 66 y.o. woman with Stage IIC vulvar melanoma who presents for surveillance visit after immunotherapy treatment was discontinued secondary to intolerance.  Patient is NED on exam today.  She continues to have some discoloration of the inferior aspect of her scar.  Picture was taken to document this in epic.  I biopsied this at her last visit and it was negative for recurrence of melanoma.  For surveillance in the setting of her vulvar melanoma, we will plan for groin ultrasound now.  We will plan for CT imaging at her follow-up visit in 3 months, a year after her last  imaging.  She is established with dermatology and is following regularly with them.  We discussed plan for follow-up in 3 months.  I will contact her with results of ultrasound when they are available.  24 minutes of total time was spent for this patient encounter, including preparation, face-to-face counseling with the patient and coordination of care, and documentation of the encounter.  [name], MD  Division of Gynecologic Oncology  Department of Obstetrics and Gynecology  [facility]

## 2022-03-10 ENCOUNTER — Other Ambulatory Visit: Payer: Self-pay | Admitting: Family Medicine

## 2022-03-10 DIAGNOSIS — M353 Polymyalgia rheumatica: Secondary | ICD-10-CM

## 2022-03-13 ENCOUNTER — Other Ambulatory Visit (HOSPITAL_COMMUNITY): Payer: No Typology Code available for payment source

## 2022-03-20 ENCOUNTER — Ambulatory Visit (HOSPITAL_COMMUNITY)
Admission: RE | Admit: 2022-03-20 | Discharge: 2022-03-20 | Disposition: A | Discharge status: Home / Self Care | Payer: No Typology Code available for payment source | Source: Ambulatory Visit | Attending: Gynecologic Oncology | Admitting: Gynecologic Oncology

## 2022-03-20 DIAGNOSIS — C519 Malignant neoplasm of vulva, unspecified: Secondary | ICD-10-CM | POA: Diagnosis present

## 2022-05-19 ENCOUNTER — Encounter: Payer: Self-pay | Admitting: Gynecologic Oncology

## 2022-05-22 HISTORY — PX: OTHER SURGICAL HISTORY: SHX169

## 2022-05-26 ENCOUNTER — Other Ambulatory Visit: Payer: Self-pay | Admitting: Family Medicine

## 2022-05-26 DIAGNOSIS — M353 Polymyalgia rheumatica: Secondary | ICD-10-CM

## 2022-06-02 ENCOUNTER — Other Ambulatory Visit: Payer: Self-pay | Admitting: Family Medicine

## 2022-06-02 DIAGNOSIS — R600 Localized edema: Secondary | ICD-10-CM

## 2022-06-05 HISTORY — PX: CATARACT EXTRACTION: SUR2

## 2022-06-06 ENCOUNTER — Encounter: Payer: Self-pay | Admitting: Gynecologic Oncology

## 2022-06-07 ENCOUNTER — Encounter (HOSPITAL_COMMUNITY): Payer: Self-pay

## 2022-06-07 ENCOUNTER — Ambulatory Visit (HOSPITAL_COMMUNITY)
Admission: RE | Admit: 2022-06-07 | Discharge: 2022-06-07 | Disposition: A | Discharge status: Home / Self Care | Payer: No Typology Code available for payment source | Source: Ambulatory Visit | Attending: Gynecologic Oncology | Admitting: Gynecologic Oncology

## 2022-06-07 ENCOUNTER — Encounter: Payer: Self-pay | Admitting: Gynecologic Oncology

## 2022-06-07 DIAGNOSIS — C519 Malignant neoplasm of vulva, unspecified: Secondary | ICD-10-CM | POA: Diagnosis present

## 2022-06-07 MED ORDER — IOHEXOL 300 MG/ML  SOLN
100.0000 mL | Freq: Once | INTRAMUSCULAR | Status: AC | PRN
  Administered 2022-06-07: 100 mL via INTRAVENOUS

## 2022-06-07 MED ORDER — SODIUM CHLORIDE (PF) 0.9 % IJ SOLN
INTRAMUSCULAR | Status: AC
  Filled 2022-06-07: qty 50

## 2022-06-09 ENCOUNTER — Inpatient Hospital Stay: Payer: No Typology Code available for payment source | Attending: Gynecologic Oncology | Admitting: Gynecologic Oncology

## 2022-06-09 ENCOUNTER — Encounter: Payer: Self-pay | Admitting: Gynecologic Oncology

## 2022-06-09 VITALS — BP 137/75 | HR 55 | Temp 98.3°F | Resp 16 | Ht 62.01 in | Wt 183.2 lb

## 2022-06-09 DIAGNOSIS — Z85820 Personal history of malignant melanoma of skin: Secondary | ICD-10-CM | POA: Diagnosis not present

## 2022-06-09 DIAGNOSIS — Z9221 Personal history of antineoplastic chemotherapy: Secondary | ICD-10-CM

## 2022-06-09 DIAGNOSIS — C519 Malignant neoplasm of vulva, unspecified: Secondary | ICD-10-CM

## 2022-06-09 DIAGNOSIS — Z809 Family history of malignant neoplasm, unspecified: Secondary | ICD-10-CM | POA: Diagnosis not present

## 2022-06-09 DIAGNOSIS — Z8051 Family history of malignant neoplasm of kidney: Secondary | ICD-10-CM | POA: Diagnosis not present

## 2022-06-09 NOTE — Patient Instructions (Signed)
It was good to see you today.  I do not see or feel any evidence of cancer recurrence on your exam.  I will see you for follow-up in 3 months.  I will contact you with your ultrasound results once I get them.  As always, if you develop any new and concerning symptoms before your next visit, please call to see me sooner.

## 2022-06-09 NOTE — Progress Notes (Signed)
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  She denies pain   12/07/2020 Initial Biopsy   FINAL MICROSCOPIC DIAGNOSIS:   A.   RIGHT VULVA, BIOPSY: PRIMARY MALIGNANT MELANOMA OF VULVA, MELANOMA  TABLE BELOW  MALIGNANT MELANOMA (AJCC 8TH EDITION):  PROCEDURE: BIOPSY  SPECIMEN ANATOMIC SITE: RIGHT VULVA  BRESLOW'S DEPTH/MAXIMUM TUMOR THICKNESS: 5.05 MM  CLARK/ANATOMIC LEVEL* IV  MARGINS:  PERIPHERAL: INVOLVED  DEEP: FREE  ULCERATION: PRESENT  MITOTIC INDEX: 11/mm2  LYMPHO-VASCULAR INVASION: PRESENT*  NEUTROTROPISM: ABSENT  TUMOR-INFILTRATING LYMPHOCYTES: NON-BRISK  LYMPH NODES: N/A  PATHOLOGIC STAGE: PT4B NX MX  *VULVAR MELANOMAS MAY NOT HAVE THE SAME BEHAVIOR STAGE FOR STAGE AS SUPERFICIAL SPREADING CUTANEOUS MELANOMA, AND LANDMARKS SUCH AS BRESLOW'S DEPTH MAY NOT APPLY. NONETHELESS, THIS SHOULD BE TREATED AS A PT4B MELANOMA AND COMPLETE EXCISION, WITH POSSIBLE LYMPH NODE EVALUATION, UNDERTAKEN. SEE COMMENT FOR IHC PROFILE.    COMMENT:  Sections show confluent and nested growth of atypical melanocytes along vulvar epithelium in an in-situ melanoma growth pattern at the periphery, with a central large ulcerated nodule of similar atypical  melanocytes, consonant with primary vulvar melanoma. Notably, IHC was performed to evaluate the lineage and is diagnostic for melanoma. Lesional melanocytes are positive with S100, MelanA, and show a high ki-67 uptake, and are negative with CDX-2, synaptophysin, CK20, TTF-1, CK7, CD56, and lymphoid markers CD5, CD3, CD20 highlight only background lymphocytes.    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 The only potential hypermetabolic finding of note is a small flat focus of accentuated metabolic activity along the anterior vaginal vestibule/perineum, maximum SUV 6.5. Most common cause for this type of appearance would be a trace amount of incontinence of urinary FDG, but given the proximity to the reported vulvar melanoma, this small focus is considered indeterminate. 2. Other imaging findings of potential clinical significance: Aortic Atherosclerosis (ICD10-I70.0). Small type 1 hiatal hernia. Palatine tonsilloliths. Colonic diverticulosis.   01/18/2021 Surgery   Preoperative Diagnosis: Vulvar melanoma  Postoperative Diagnosis: Same  Procedures performed: Wide radical vulvectomy, R inguinofemoral sentinel LN biopsy  Attending Surgeon: [name], MD  Fellow: [name]  Resident: Melony Overly, MD; Darrin Nipper, MD  Anesthesia: General endotracheal  Findings:  1. Vulva with biopsy site changes at R labia minora adjacent to the clitoris. No visible tumor. 2. Inguinofemoral nodes not clinically enlarged or abnormal.   Specimens:  1. R inguinal sentinel LN, hot and blue 2. Portion of vulva 3. Superior deep margin    01/18/2021 Pathology Results   Only melanoma in situ seen in final resection, SLN negative   02/08/2021 - 03/22/2021 Chemotherapy         03/29/2021 Genetic Testing   Negative genetic testing on the Multi-cancer +RNA panel.  The report date is March 29, 2021.  The Multi-Gene Panel offered by [facility] includes sequencing and/or deletion duplication testing of the following 84 genes: AIP, ALK, APC, ATM, AXIN2,BAP1,  BARD1, BLM, BMPR1A, BRCA1, BRCA2, BRIP1, CASR, CDC73, CDH1,  CDK4, CDKN1B, CDKN1C, CDKN2A (p14ARF), CDKN2A (p16INK4a), CEBPA, CHEK2, CTNNA1, DICER1, DIS3L2, EGFR  (c.2369C>T, p.Thr790Met variant only), EPCAM (Deletion/duplication testing only), FH, FLCN, GATA2, GPC3, GREM1 (Promoter region deletion/duplication testing only), HOXB13 (c.251G>A, p.Gly84Glu), HRAS, KIT, MAX, MEN1, MET, MITF (c.952G>A, p.Glu318Lys variant only), MLH1, MSH2, MSH3, MSH6, MUTYH, NBN, NF1, NF2, NTHL1, PALB2, PDGFRA, PHOX2B, PMS2, POLD1, POLE, POT1, PRKAR1A, PTCH1, PTEN, RAD50, RAD51C, RAD51D, RB1, RECQL4, RET, RUNX1, SDHAF2, SDHA (sequence changes only), SDHB, SDHC, SDHD, SMAD4, SMARCA4, SMARCB1, SMARCE1, STK11, SUFU, TERC, TERT, TMEM127, TP53, TSC1, TSC2, VHL, WRN and WT1.     04/29/2021 PET scan   1. No vulvar mass or residual hypermetabolism. 2. No findings suspicious for metastatic disease.   11/28/2021 Pathology Results   Vulvar biopsy - fibrosis c/w scar. Small junctional nevus. No melanoma identified.     Interval History: Doing well.  Had cataract surgery recently.  Denies any vulvar symptoms including pruritus, bleeding, discharge, or pain.  Reports baseline bowel and bladder function.  Saw her dermatologist shortly after her last visit with me.  Is scheduled for a visit at the end of the month.  Last mammogram: 11/29/21 Last colonoscopy: 12/29/21 Last pap test: 11/23/20 (NIML, HR HP negative)  Past Medical/Surgical History: Past Medical History:  Diagnosis Date   Anemia    BMI 34.0-34.9,adult    Family history of melanoma    Melanotic macule of vulvar labia    Migraine    Polymyalgia rheumatica (HCC)    Venous insufficiency     Past Surgical History:  Procedure Laterality Date   Cataract Right 05/22/2022   CATARACT EXTRACTION Left 06/05/2022   COLONOSCOPY      Family History  Problem Relation Age of Onset   Diabetes Mother    AVM Father    Melanoma Father        multiple melanomas dx between 60-90   Cancer Father 37       tumor on kidney   Cancer Paternal Grandfather        laryngeal cancer   Cancer Daughter 64       appendiceal cancer   Breast  cancer Neg Hx    Ovarian cancer Neg Hx    Colon cancer Neg Hx    Uterine cancer Neg Hx    Colon polyps Neg Hx    Esophageal cancer Neg Hx    Stomach cancer Neg Hx    Rectal cancer Neg Hx     Social History   Socioeconomic History   Marital status: Married    Spouse name: Not on file   Number of children: 2   Years of education: Not on file   Highest education level: Not on file  Occupational History   Occupation: shelf clerk   Occupation: stocking  Tobacco Use   Smoking status: Never   Smokeless tobacco: Never  Vaping Use   Vaping Use: Never used  Substance and Sexual Activity   Alcohol use: Yes    Comment: occasional   Drug use: No   Sexual activity: Not Currently  Other Topics Concern   Not on file  Social History Narrative   Not on file   Social Determinants of Health   Financial Resource Strain: Not on file  Food Insecurity: Not on file  Transportation Needs: Not on file  Physical Activity: Not on file  Stress: Not on file  Social Connections: Not on file    Current Medications:  Current Outpatient Medications:    hydrochlorothiazide (HYDRODIURIL) 25 MG tablet, TAKE 1  TABLET BY MOUTH DAILY AS NEEDED., Disp: 90 tablet, Rfl: 3   KLOR-CON M20 20 MEQ tablet, TAKE 1 TABLET BY MOUTH EVERY DAY, Disp: 90 tablet, Rfl: 1   Multiple Vitamins-Minerals (MULTIVITAMIN WITH MINERALS) tablet, Take 1 tablet by mouth daily., Disp: , Rfl:    predniSONE (DELTASONE) 10 MG tablet, TAKE 1 TABLET (10 MG TOTAL) BY MOUTH DAILY WITH BREAKFAST., Disp: 90 tablet, Rfl: 0   Difluprednate 0.05 % EMUL, Place 1 drop into the right eye 4 (four) times daily., Disp: , Rfl:    ketorolac (ACULAR) 0.5 % ophthalmic solution, Place 1 drop into the right eye 4 (four) times daily., Disp: , Rfl:    moxifloxacin (VIGAMOX) 0.5 % ophthalmic solution, Place 1 drop into the left eye 4 (four) times daily., Disp: , Rfl:   Review of Systems: Denies appetite changes, fevers, chills, fatigue, unexplained  weight changes. Denies hearing loss, neck lumps or masses, mouth sores, ringing in ears or voice changes. Denies cough or wheezing.  Denies shortness of breath. Denies chest pain or palpitations. Denies leg swelling. Denies abdominal distention, pain, blood in stools, constipation, diarrhea, nausea, vomiting, or early satiety. Denies pain with intercourse, dysuria, frequency, hematuria or incontinence. Denies hot flashes, pelvic pain, vaginal bleeding or vaginal discharge.   Denies joint pain, back pain or muscle pain/cramps. Denies itching, rash, or wounds. Denies dizziness, headaches, numbness or seizures. Denies swollen lymph nodes or glands, denies easy bruising or bleeding. Denies anxiety, depression, confusion, or decreased concentration.  Physical Exam: BP 137/75 (BP Location: Left Arm, Patient Position: Sitting)   Pulse (!) 55   Temp 98.3 F (36.8 C) (Oral)   Resp 16   Ht 5' 2.01" (1.575 m)   Wt 183 lb 3.2 oz (83.1 kg)   SpO2 100%   BMI 33.50 kg/m  General: Alert, oriented, no acute distress. HEENT: Normocephalic, atraumatic, sclera anicteric. Chest: Clear to auscultation bilaterally.  No wheezes or rhonchi. Cardiovascular: Regular rate and rhythm, no murmurs. Abdomen: soft, nontender.  Normoactive bowel sounds.  No masses or hepatosplenomegaly appreciated.   Extremities: Grossly normal range of motion.  Warm, well perfused.  No edema bilaterally. Skin: No rashes or lesions noted. Lymphatics: No cervical, supraclavicular, or inguinal adenopathy. GU: External female genitalia notable for changes from prior surgery along the right vulva.  There is mild discoloration along the most inferior aspect of the incision, similar to what is has been. No other areas of discoloration, lesions, or atypical vasculature.  Woods lamp was also used for this examination.  Laboratory & Radiologic Studies: CT C/A/P on 06/07/22: 1. No evidence of metastatic disease in the chest, abdomen  or pelvis. 2. Extensive left-sided colonic diverticulosis without findings of acute diverticulitis. 3. Moderate-sized hiatal hernia. 4.  Aortic Atherosclerosis (ICD10-I70.0).  Assessment & Plan: Ariel Rios is a 66 y.o. woman with Stage IIC vulvar melanoma who presents for surveillance visit after immunotherapy treatment was discontinued secondary to intolerance.   Patient is NED on exam today.  Area of discoloration at inferior aspect of her scar has been previously biopsied, and it was negative for recurrence of melanoma.   For surveillance in the setting of her vulvar melanoma, we will plan for ultrasound to assess groins in November or December.  CT was recently performed and negative for evidence of metastatic disease.    She is established with dermatology and is following regularly with them.   Per medical oncology recommendations, we will continue with surveillance visits every 3 months, groin ultrasound every  4-6 months for 2 years and then every 6-12 months until year 5.  Consideration for CT scan every 4-12 months during this time period.  We reviewed signs and symptoms that would be concerning for cancer recurrence.  I stressed the importance of calling if she develops any of these.  20 minutes of total time was spent for this patient encounter, including preparation, face-to-face counseling with the patient and coordination of care, and documentation of the encounter.  [name], MD  Division of Gynecologic Oncology  Department of Obstetrics and Gynecology  [facility]

## 2022-08-09 ENCOUNTER — Ambulatory Visit (HOSPITAL_COMMUNITY)
Admission: RE | Admit: 2022-08-09 | Discharge: 2022-08-09 | Disposition: A | Discharge status: Home / Self Care | Payer: No Typology Code available for payment source | Source: Ambulatory Visit | Attending: Gynecologic Oncology | Admitting: Gynecologic Oncology

## 2022-08-09 DIAGNOSIS — C519 Malignant neoplasm of vulva, unspecified: Secondary | ICD-10-CM | POA: Insufficient documentation

## 2022-08-10 ENCOUNTER — Telehealth: Payer: Self-pay | Admitting: Gynecologic Oncology

## 2022-08-10 DIAGNOSIS — C519 Malignant neoplasm of vulva, unspecified: Secondary | ICD-10-CM

## 2022-08-10 DIAGNOSIS — R599 Enlarged lymph nodes, unspecified: Secondary | ICD-10-CM

## 2022-08-10 NOTE — Telephone Encounter (Signed)
Called patient to discuss recent ultrasound. Reviewed details. Plan for PET scan and US biopsy of enlarged LN. Both orders placed.  [name] MD Gynecologic Oncology

## 2022-08-10 NOTE — Progress Notes (Signed)
Ariel Edouard, MD  [name] D OK for US guided core biopsy of right inguinal LN.  GY

## 2022-08-15 ENCOUNTER — Telehealth: Payer: Self-pay | Admitting: *Deleted

## 2022-08-15 NOTE — Telephone Encounter (Signed)
Per Dr Berline Lopes patient scheduled for PET scan. Patient aware of date/time/instructions

## 2022-08-23 ENCOUNTER — Other Ambulatory Visit: Payer: Self-pay | Admitting: Family Medicine

## 2022-08-23 DIAGNOSIS — M353 Polymyalgia rheumatica: Secondary | ICD-10-CM

## 2022-08-31 ENCOUNTER — Other Ambulatory Visit: Payer: Self-pay | Admitting: Gynecologic Oncology

## 2022-08-31 ENCOUNTER — Encounter (HOSPITAL_COMMUNITY)
Admission: RE | Admit: 2022-08-31 | Discharge: 2022-08-31 | Disposition: A | Discharge status: Home / Self Care | Payer: No Typology Code available for payment source | Source: Ambulatory Visit | Attending: Gynecologic Oncology | Admitting: Gynecologic Oncology

## 2022-08-31 DIAGNOSIS — R935 Abnormal findings on diagnostic imaging of other abdominal regions, including retroperitoneum: Secondary | ICD-10-CM | POA: Insufficient documentation

## 2022-08-31 DIAGNOSIS — C519 Malignant neoplasm of vulva, unspecified: Secondary | ICD-10-CM | POA: Insufficient documentation

## 2022-08-31 DIAGNOSIS — R599 Enlarged lymph nodes, unspecified: Secondary | ICD-10-CM

## 2022-08-31 DIAGNOSIS — R590 Localized enlarged lymph nodes: Secondary | ICD-10-CM | POA: Diagnosis not present

## 2022-08-31 DIAGNOSIS — I700 Atherosclerosis of aorta: Secondary | ICD-10-CM | POA: Diagnosis not present

## 2022-08-31 DIAGNOSIS — K449 Diaphragmatic hernia without obstruction or gangrene: Secondary | ICD-10-CM | POA: Insufficient documentation

## 2022-08-31 LAB — GLUCOSE, CAPILLARY: Glucose-Capillary: 83 mg/dL (ref 70–99)

## 2022-08-31 MED ORDER — FLUDEOXYGLUCOSE F - 18 (FDG) INJECTION
9.1000 | Freq: Once | INTRAVENOUS | Status: AC
  Administered 2022-08-31: 9.02 via INTRAVENOUS

## 2022-09-01 ENCOUNTER — Other Ambulatory Visit (HOSPITAL_COMMUNITY): Payer: Self-pay | Admitting: Physician Assistant

## 2022-09-01 ENCOUNTER — Encounter: Payer: Self-pay | Admitting: Gynecologic Oncology

## 2022-09-01 DIAGNOSIS — Z01818 Encounter for other preprocedural examination: Secondary | ICD-10-CM

## 2022-09-04 ENCOUNTER — Ambulatory Visit (HOSPITAL_COMMUNITY)
Admission: RE | Admit: 2022-09-04 | Discharge: 2022-09-04 | Disposition: A | Discharge status: Home / Self Care | Payer: No Typology Code available for payment source | Source: Ambulatory Visit | Attending: Gynecologic Oncology | Admitting: Gynecologic Oncology

## 2022-09-04 ENCOUNTER — Other Ambulatory Visit: Payer: Self-pay

## 2022-09-04 ENCOUNTER — Encounter (HOSPITAL_COMMUNITY): Payer: Self-pay

## 2022-09-04 DIAGNOSIS — Z8544 Personal history of malignant neoplasm of other female genital organs: Secondary | ICD-10-CM | POA: Insufficient documentation

## 2022-09-04 DIAGNOSIS — C519 Malignant neoplasm of vulva, unspecified: Secondary | ICD-10-CM

## 2022-09-04 DIAGNOSIS — R590 Localized enlarged lymph nodes: Secondary | ICD-10-CM | POA: Diagnosis not present

## 2022-09-04 DIAGNOSIS — C439 Malignant melanoma of skin, unspecified: Secondary | ICD-10-CM | POA: Insufficient documentation

## 2022-09-04 DIAGNOSIS — R599 Enlarged lymph nodes, unspecified: Secondary | ICD-10-CM

## 2022-09-04 DIAGNOSIS — Z01818 Encounter for other preprocedural examination: Secondary | ICD-10-CM

## 2022-09-04 LAB — CBC
HCT: 38.5 % (ref 36.0–46.0)
Hemoglobin: 12.4 g/dL (ref 12.0–15.0)
MCH: 30.7 pg (ref 26.0–34.0)
MCHC: 32.2 g/dL (ref 30.0–36.0)
MCV: 95.3 fL (ref 80.0–100.0)
Platelets: 258 10*3/uL (ref 150–400)
RBC: 4.04 MIL/uL (ref 3.87–5.11)
RDW: 13 % (ref 11.5–15.5)
WBC: 4.7 10*3/uL (ref 4.0–10.5)
nRBC: 0 % (ref 0.0–0.2)

## 2022-09-04 LAB — PROTIME-INR
INR: 1 (ref 0.8–1.2)
Prothrombin Time: 13.3 seconds (ref 11.4–15.2)

## 2022-09-04 MED ORDER — MIDAZOLAM HCL 2 MG/2ML IJ SOLN
INTRAMUSCULAR | Status: AC | PRN
  Administered 2022-09-04 (×2): 1 mg via INTRAVENOUS

## 2022-09-04 MED ORDER — SODIUM CHLORIDE 0.9 % IV SOLN: INTRAVENOUS | Status: DC

## 2022-09-04 MED ORDER — MIDAZOLAM HCL 2 MG/2ML IJ SOLN
INTRAMUSCULAR | Status: AC
  Filled 2022-09-04: qty 2

## 2022-09-04 MED ORDER — FENTANYL CITRATE (PF) 100 MCG/2ML IJ SOLN
INTRAMUSCULAR | Status: AC | PRN
  Administered 2022-09-04 (×2): 50 ug via INTRAVENOUS

## 2022-09-04 MED ORDER — LIDOCAINE HCL 1 % IJ SOLN
INTRAMUSCULAR | Status: AC
  Administered 2022-09-04: 10 mL
  Filled 2022-09-04: qty 20

## 2022-09-04 MED ORDER — FENTANYL CITRATE (PF) 100 MCG/2ML IJ SOLN
INTRAMUSCULAR | Status: AC
  Filled 2022-09-04: qty 2

## 2022-09-04 NOTE — Procedures (Signed)
Interventional Radiology Procedure:   Indications: History of vulvar melanoma and enlarging right inguinal lymph node  Procedure: US guided core biopsy of right inguinal lymph node  Findings: Enlarged right inguinal lymph node.  Multiple core biopsies obtained. The core specimens were black and small.  Complications: No immediate complications noted.     EBL: Minimal  Plan: Discharge to home in 1 hour   Ariel Rios R. Anselm Pancoast, MD  Pager: [phone]

## 2022-09-04 NOTE — Consult Note (Signed)
Chief Complaint: Patient was seen in consultation today for image guided right inguinal lymph node biopsy  Referring Physician(s): Tucker,Katherine R  Supervising Physician: Markus Daft  Patient Status: [place]  History of Present Illness: Ariel Rios is a 66 y.o. female with PMH anemia, migraines, PMR, venous insufficiency and vulvar melanoma diagnosed in 2022. Recent PET scan on 09/01/22 revealed:  1. Enlarging RIGHT inguinal lymph node suspicious for metastatic disease. This shows only mild to moderate increased metabolic activity but displays FDG uptake greater than blood pool and shows abnormal morphology. Tissue sampling may be helpful. 2. No additional signs of disease in the neck, chest, abdomen or pelvis. 3. Signs of pelvic floor dysfunction and probable cystocele. 4. Chronic fracture of RIGHT inferior pubic ramus signs of healing since previous imaging. 5. Aortic atherosclerosis. 6. Moderate to large hiatal hernia.   Aortic Atherosclerosis   She presents today for image guided right inguinal lymph node biopsy for further evaluation.    Past Medical History:  Diagnosis Date   Anemia    BMI 34.0-34.9,adult    Family history of melanoma    Melanotic macule of vulvar labia    Migraine    Polymyalgia rheumatica ([place])    Venous insufficiency     Past Surgical History:  Procedure Laterality Date   Cataract Right 05/22/2022   CATARACT EXTRACTION Left 06/05/2022   COLONOSCOPY      Allergies: Patient has no known allergies.  Medications: Prior to Admission medications   Medication Sig Start Date End Date Taking? Authorizing Provider  hydrochlorothiazide (HYDRODIURIL) 25 MG tablet TAKE 1 TABLET BY MOUTH DAILY AS NEEDED. 06/02/22  Yes [name], [name], MD  KLOR-CON M20 20 MEQ tablet TAKE 1 TABLET BY MOUTH EVERY DAY 02/21/22  Yes [name], [name], MD  Multiple Vitamins-Minerals (MULTIVITAMIN WITH MINERALS) tablet Take 1 tablet by mouth daily.   Yes  [provider]  predniSONE (DELTASONE) 10 MG tablet TAKE 1 TABLET (10 MG TOTAL) BY MOUTH DAILY WITH BREAKFAST. 08/23/22  Yes [name], [name], MD  Difluprednate 0.05 % EMUL Place 1 drop into the right eye 4 (four) times daily. 05/23/22   [provider]  ketorolac (ACULAR) 0.5 % ophthalmic solution Place 1 drop into the right eye 4 (four) times daily. 05/23/22   [provider]  moxifloxacin (VIGAMOX) 0.5 % ophthalmic solution Place 1 drop into the left eye 4 (four) times daily. 05/23/22   [provider]  prochlorperazine (COMPAZINE) 10 MG tablet Take 1 tablet (10 mg total) by mouth every 6 (six) hours as needed for nausea or vomiting. Patient not taking: Reported on 03/07/2021 02/07/21 05/02/21  [name], MD     Family History  Problem Relation Age of Onset   Diabetes Mother    AVM Father    Melanoma Father        multiple melanomas dx between 71-90   Cancer Father 69       tumor on kidney   Cancer Paternal Grandfather        laryngeal cancer   Cancer Daughter 89       appendiceal cancer   Breast cancer Neg Hx    Ovarian cancer Neg Hx    Colon cancer Neg Hx    Uterine cancer Neg Hx    Colon polyps Neg Hx    Esophageal cancer Neg Hx    Stomach cancer Neg Hx    Rectal cancer Neg Hx     Social History  Socioeconomic History   Marital status: Married    Spouse name: Not on file   Number of children: 2   Years of education: Not on file   Highest education level: Not on file  Occupational History   Occupation: shelf clerk   Occupation: stocking  Tobacco Use   Smoking status: Never   Smokeless tobacco: Never  Vaping Use   Vaping Use: Never used  Substance and Sexual Activity   Alcohol use: Yes    Comment: occasional   Drug use: No   Sexual activity: Not Currently  Other Topics Concern   Not on file  Social History Narrative   Not on file   Social Determinants of Health   Financial Resource Strain: Not on file  Food Insecurity:  Not on file  Transportation Needs: Not on file  Physical Activity: Not on file  Stress: Not on file  Social Connections: Not on file      Review of Systems denies fever,HA,CP, dyspnea, cough, abd/back pain,N/V or bleeding  Vital Signs: BP (!) 150/80 (BP Location: Right Arm)   Pulse 66   Temp 98.2 F (36.8 C) (Oral)   Resp 18   SpO2 95%     Physical Exam awake/alert; chest- few fine rt basilar crackles, left clear; heart- RRR; abd- soft,+BS,NT  Imaging: NM PET Image Restage (PS) Whole Body (F-18 FDG)  Result Date: 09/01/2022 CLINICAL DATA:  Subsequent treatment strategy for vulvar melanoma. EXAM: NUCLEAR MEDICINE PET WHOLE BODY TECHNIQUE: 9.02 mCi F-18 FDG was injected intravenously. Full-ring PET imaging was performed from the head to foot after the radiotracer. CT data was obtained and used for attenuation correction and anatomic localization. Fasting blood glucose: 83 mg/dl COMPARISON:  June 07, 2022 CT of the chest abdomen and pelvis and PET evaluation of 04/29/2021 FINDINGS: Mediastinal blood pool activity: SUV max 1.81 HEAD/NECK: No hypermetabolic activity in the scalp. No hypermetabolic cervical lymph nodes. Incidental CT findings: Choose 1 CHEST: No hypermetabolic mediastinal or hilar nodes. No suspicious pulmonary nodules on the CT scan. Incidental CT findings: Aortic atherosclerosis. Moderate to large hiatal hernia. ABDOMEN/PELVIS: RIGHT inguinal lymph node as identified on the recent sonogram with abnormal morphology measuring 18 mm short axis. Previously approximately 6 mm (image 195/4) maximum SUV 2.58. No additional enlarged lymph nodes are signs of solid organ disease. Incidental CT findings: No acute findings relative to liver, gallbladder, pancreas, spleen, adrenal glands or kidneys. Signs of pelvic floor dysfunction and probable cystocele. Colonic diverticulosis. No acute gastrointestinal findings. No adenopathy by size criteria in the retroperitoneum or upper abdomen.  No pelvic sidewall lymphadenopathy. SKELETON: No focal hypermetabolic activity to suggest skeletal metastasis. Incidental CT findings: Chronic fracture of RIGHT inferior pubic ramus signs of healing since previous imaging. EXTREMITIES: No abnormal hypermetabolic activity in the lower extremities. Incidental CT findings: none IMPRESSION: 1. Enlarging RIGHT inguinal lymph node suspicious for metastatic disease. This shows only mild to moderate increased metabolic activity but displays FDG uptake greater than blood pool and shows abnormal morphology. Tissue sampling may be helpful. 2. No additional signs of disease in the neck, chest, abdomen or pelvis. 3. Signs of pelvic floor dysfunction and probable cystocele. 4. Chronic fracture of RIGHT inferior pubic ramus signs of healing since previous imaging. 5. Aortic atherosclerosis. 6. Moderate to large hiatal hernia. Aortic Atherosclerosis (ICD10-I70.0). Electronically Signed   By: Zetta Bills M.D.   On: 09/01/2022 12:37   US Abdomen Limited  Result Date: 08/09/2022 CLINICAL DATA:  Groin evaluation.  Melanoma a vulva. EXAM:  ULTRASOUND ABDOMEN LIMITED RIGHT UPPER QUADRANT COMPARISON:  None Available. FINDINGS: Gallbladder: Cholelithiasis. No wall thickening, pericholecystic fluid, sludge, or Murphy's sign. Common bile duct: Diameter: 3.4 mm Liver: A 3.2 cm cyst is identified in the left hepatic lobe. No follow-up imaging recommended for this cyst. The liver is otherwise unremarkable. Portal vein is patent on color Doppler imaging with normal direction of blood flow towards the liver. Other: A morphologically abnormal lymph node is identified in the right groin measuring 1.9 x 1.7 x 1.4 cm with a thickened cortex and restriction of the fatty hilum. IMPRESSION: 1. Cholelithiasis in an otherwise normal appearing gallbladder. 2. The common bile duct is normal. 3. A morphologically abnormal lymph node is identified in the right groin measuring 1.9 x 1.7 x 1.4 cm with a  thickened cortex and restriction of the fatty hilum. The finding is concerning for a metastatic node given history. Electronically Signed   By: Dorise Bullion III M.D.   On: 08/09/2022 21:05    Labs:  CBC: No results for input(s): "WBC", "HGB", "HCT", "PLT" in the last 8760 hours.  COAGS: No results for input(s): "INR", "APTT" in the last 8760 hours.  BMP: Recent Labs    12/12/21 0728 01/04/22 1027  NA 140  --   K 3.2* 4.0  CL 99  --   CO2 32  --   GLUCOSE 81  --   BUN 16  --   CALCIUM 9.6  --   CREATININE 0.68  --     LIVER FUNCTION TESTS: No results for input(s): "BILITOT", "AST", "ALT", "ALKPHOS", "PROT", "ALBUMIN" in the last 8760 hours.  TUMOR MARKERS: No results for input(s): "AFPTM", "CEA", "CA199", "CHROMGRNA" in the last 8760 hours.  Assessment and Plan: 66 y.o. female with PMH anemia, migraines, PMR, venous insufficiency and vulvar melanoma diagnosed in 2022. Recent PET scan on 09/01/22 revealed:  1. Enlarging RIGHT inguinal lymph node suspicious for metastatic disease. This shows only mild to moderate increased metabolic activity but displays FDG uptake greater than blood pool and shows abnormal morphology. Tissue sampling may be helpful. 2. No additional signs of disease in the neck, chest, abdomen or pelvis. 3. Signs of pelvic floor dysfunction and probable cystocele. 4. Chronic fracture of RIGHT inferior pubic ramus signs of healing since previous imaging. 5. Aortic atherosclerosis. 6. Moderate to large hiatal hernia.   Aortic Atherosclerosis   She presents today for image guided right inguinal lymph node biopsy for further evaluation.Risks and benefits of procedure was discussed with the patient  including, but not limited to bleeding, infection, damage to adjacent structures or low yield requiring additional tests.  All of the questions were answered and there is agreement to proceed.  Consent signed and in chart.  LABS PENDING  Thank you for  this interesting consult.  I greatly enjoyed meeting Ariel Rios and look forward to participating in their care.  A copy of this report was sent to the requesting provider on this date.  Electronically Signed: D. Rowe Robert, PA-C 09/04/2022, 11:24 AM   I spent a total of 20 minutes    in face to face in clinical consultation, greater than 50% of which was counseling/coordinating care for image guided right inguinal lymph node biopsy

## 2022-09-04 NOTE — Discharge Instructions (Signed)
Moderate Conscious Sedation, Adult, Care After This sheet gives you information about how to care for yourself after your procedure. Your health care provider may also give you more specific instructions. If you have problems or questions, contact your health care provider. What can I expect after the procedure? After the procedure, it is common to have: Sleepiness for several hours. Impaired judgment for several hours. Difficulty with balance. Vomiting if you eat too soon. Follow these instructions at home: For the time period you were told by your health care provider:     Rest. Do not participate in activities where you could fall or become injured. Do not drive or use machinery. Do not drink alcohol. Do not take sleeping pills or medicines that cause drowsiness. Do not make important decisions or sign legal documents. Do not take care of children on your own. Eating and drinking  Follow the diet recommended by your health care provider. Drink enough fluid to keep your urine pale yellow. If you vomit: Drink water, juice, or soup when you can drink without vomiting. Make sure you have little or no nausea before eating solid foods. General instructions Take over-the-counter and prescription medicines only as told by your health care provider. Have a responsible adult stay with you for the time you are told. It is important to have someone help care for you until you are awake and alert. Do not smoke. Keep all follow-up visits as told by your health care provider. This is important. Contact a health care provider if: You are still sleepy or having trouble with balance after 24 hours. You feel light-headed. You keep feeling nauseous or you keep vomiting. You develop a rash. You have a fever. You have redness or swelling around the IV site. Get help right away if: You have trouble breathing. You have new-onset confusion at home. Summary After the procedure, it is common to  feel sleepy, have impaired judgment, or feel nauseous if you eat too soon. Rest after you get home. Know the things you should not do after the procedure. Follow the diet recommended by your health care provider and drink enough fluid to keep your urine pale yellow. Get help right away if you have trouble breathing or new-onset confusion at home. This information is not intended to replace advice given to you by your health care provider. Make sure you discuss any questions you have with your health care provider. Document Revised: 01/09/2020 Document Reviewed: 08/07/2019 Elsevier Patient Education  [place].     Needle Biopsy, Care After This sheet gives you information about how to care for yourself after your procedure. Your health care provider may also give you more specific instructions. If you have problems or questions, contact your health careprovider. What can I expect after the procedure? After the procedure, it is common to have soreness, bruising, or mild pain atthe puncture site. This should go away in a few days. Follow these instructions at home: Needle insertion site care Wash your hands with soap and water before you change your bandage (dressing). If you cannot use soap and water, use hand sanitizer. Follow instructions from your health care provider about how to take care of your puncture site. This includes: When and how to change your dressing. When to remove your dressing. Check your puncture site every day for signs of infection. Check for: Redness, swelling, or pain. Fluid or blood. Pus or a bad smell. Warmth.   General instructions Return to your normal activities as  told by your health care provider. Ask your health care provider what activities are safe for you. Do not take baths, swim, or use a hot tub until your health care provider approves. Ask your health care provider if you may take showers. You may only be allowed to take sponge baths. Take  over-the-counter and prescription medicines only as told by your health care provider. Keep all follow-up visits as told by your health care provider. This is important. Contact a health care provider if: You have a fever. You have redness, swelling, or pain at the puncture site that lasts longer than a few days. You have fluid, blood, or pus coming from your puncture site. Your puncture site feels warm to the touch. Get help right away if: You have severe bleeding from the puncture site. Summary After the procedure, it is common to have soreness, bruising, or mild pain at the puncture site. This should go away in a few days. Check your puncture site every day for signs of infection, such as redness, swelling, or pain. Get help right away if you have severe bleeding from your puncture site. This information is not intended to replace advice given to you by your health care provider. Make sure you discuss any questions you have with your healthcare provider. Document Revised: 03/11/2020 Document Reviewed: 03/11/2020 Elsevier Patient Education  [place].

## 2022-09-05 ENCOUNTER — Encounter: Payer: Self-pay | Admitting: Hematology and Oncology

## 2022-09-06 ENCOUNTER — Other Ambulatory Visit: Payer: Self-pay | Admitting: Family Medicine

## 2022-09-06 ENCOUNTER — Telehealth: Payer: Self-pay | Admitting: Gynecologic Oncology

## 2022-09-06 DIAGNOSIS — E876 Hypokalemia: Secondary | ICD-10-CM

## 2022-09-06 LAB — SURGICAL PATHOLOGY

## 2022-09-06 NOTE — Telephone Encounter (Signed)
Called patient, discussed biopsy result showing recurrent melanoma. We have a visit on Friday and will discuss further.  [name] MD Gynecologic Oncology

## 2022-09-08 ENCOUNTER — Encounter: Payer: Self-pay | Admitting: Gynecologic Oncology

## 2022-09-08 ENCOUNTER — Inpatient Hospital Stay: Payer: No Typology Code available for payment source | Attending: Gynecologic Oncology | Admitting: Gynecologic Oncology

## 2022-09-08 ENCOUNTER — Inpatient Hospital Stay (HOSPITAL_BASED_OUTPATIENT_CLINIC_OR_DEPARTMENT_OTHER): Payer: No Typology Code available for payment source | Admitting: Gynecologic Oncology

## 2022-09-08 VITALS — BP 130/72 | HR 64 | Temp 98.2°F | Resp 16 | Ht 62.0 in | Wt 186.4 lb

## 2022-09-08 DIAGNOSIS — Z9079 Acquired absence of other genital organ(s): Secondary | ICD-10-CM | POA: Diagnosis not present

## 2022-09-08 DIAGNOSIS — Z9221 Personal history of antineoplastic chemotherapy: Secondary | ICD-10-CM | POA: Insufficient documentation

## 2022-09-08 DIAGNOSIS — C519 Malignant neoplasm of vulva, unspecified: Secondary | ICD-10-CM | POA: Insufficient documentation

## 2022-09-08 DIAGNOSIS — C774 Secondary and unspecified malignant neoplasm of inguinal and lower limb lymph nodes: Secondary | ICD-10-CM | POA: Insufficient documentation

## 2022-09-08 DIAGNOSIS — R599 Enlarged lymph nodes, unspecified: Secondary | ICD-10-CM

## 2022-09-08 MED ORDER — TRAMADOL HCL 50 MG PO TABS: 50.0000 mg | ORAL_TABLET | Freq: Four times a day (QID) | ORAL | 0 refills | Status: DC | PRN

## 2022-09-08 MED ORDER — SENNOSIDES-DOCUSATE SODIUM 8.6-50 MG PO TABS: 2.0000 | ORAL_TABLET | Freq: Every day | ORAL | 0 refills | Status: DC

## 2022-09-08 NOTE — H&P (View-Only) (Signed)
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  She denies pain   12/07/2020 Initial Biopsy   FINAL MICROSCOPIC DIAGNOSIS:   A.   RIGHT VULVA, BIOPSY: PRIMARY MALIGNANT MELANOMA OF VULVA, MELANOMA  TABLE BELOW  MALIGNANT MELANOMA (AJCC 8TH EDITION):  PROCEDURE: BIOPSY  SPECIMEN ANATOMIC SITE: RIGHT VULVA  BRESLOW'S DEPTH/MAXIMUM TUMOR THICKNESS: 5.05 MM  CLARK/ANATOMIC LEVEL* IV  MARGINS:  PERIPHERAL: INVOLVED  DEEP: FREE  ULCERATION: PRESENT  MITOTIC INDEX: 11/mm2  LYMPHO-VASCULAR INVASION: PRESENT*  NEUTROTROPISM: ABSENT  TUMOR-INFILTRATING LYMPHOCYTES: NON-BRISK  LYMPH NODES: N/A  PATHOLOGIC STAGE: PT4B NX MX  *VULVAR MELANOMAS MAY NOT HAVE THE SAME BEHAVIOR STAGE FOR STAGE AS SUPERFICIAL SPREADING CUTANEOUS MELANOMA, AND LANDMARKS SUCH AS BRESLOW'S DEPTH MAY NOT APPLY. NONETHELESS, THIS SHOULD BE TREATED AS A PT4B MELANOMA AND COMPLETE EXCISION, WITH POSSIBLE LYMPH NODE EVALUATION, UNDERTAKEN. SEE COMMENT FOR IHC PROFILE.    COMMENT:  Sections show confluent and nested growth of atypical melanocytes along vulvar epithelium in an in-situ melanoma growth pattern at the periphery, with a central large ulcerated nodule of similar atypical  melanocytes, consonant with primary vulvar melanoma. Notably, IHC was performed to evaluate the lineage and is diagnostic for melanoma. Lesional melanocytes are positive with S100, MelanA, and show a high ki-67 uptake, and are negative with CDX-2, synaptophysin, CK20, TTF-1, CK7, CD56, and lymphoid markers CD5, CD3, CD20 highlight only background lymphocytes.    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 The only potential hypermetabolic finding of note is a small flat focus of accentuated metabolic activity along the anterior vaginal vestibule/perineum, maximum SUV 6.5. Most common cause for this type of appearance would be a trace amount of incontinence of urinary FDG, but given the proximity to the reported vulvar melanoma, this small focus is considered indeterminate. 2. Other imaging findings of potential clinical significance: Aortic Atherosclerosis (ICD10-I70.0). Small type 1 hiatal hernia. Palatine tonsilloliths. Colonic diverticulosis.   01/18/2021 Surgery   Preoperative Diagnosis: Vulvar melanoma  Postoperative Diagnosis: Same  Procedures performed: Wide radical vulvectomy, R inguinofemoral sentinel LN biopsy  Attending Surgeon: [name], MD  Fellow: [name]  Resident: Melony Overly, MD; Darrin Nipper, MD  Anesthesia: General endotracheal  Findings:  1. Vulva with biopsy site changes at R labia minora adjacent to the clitoris. No visible tumor. 2. Inguinofemoral nodes not clinically enlarged or abnormal.   Specimens:  1. R inguinal sentinel LN, hot and blue 2. Portion of vulva 3. Superior deep margin    01/18/2021 Pathology Results   Only melanoma in situ seen in final resection, SLN negative   02/08/2021 - 03/22/2021 Chemotherapy         03/29/2021 Genetic Testing   Negative genetic testing on the Multi-cancer +RNA panel.  The report date is March 29, 2021.  The Multi-Gene Panel offered by [facility] includes sequencing and/or deletion duplication testing of the following 84 genes: AIP, ALK, APC, ATM, AXIN2,BAP1,  BARD1, BLM, BMPR1A, BRCA1, BRCA2, BRIP1, CASR, CDC73, CDH1, CDK4, CDKN1B, CDKN1C, CDKN2A (p14ARF), CDKN2A (  p16INK4a), CEBPA, CHEK2, CTNNA1, DICER1, DIS3L2, EGFR (c.2369C>T, p.Thr790Met  variant only), EPCAM (Deletion/duplication testing only), FH, FLCN, GATA2, GPC3, GREM1 (Promoter region deletion/duplication testing only), HOXB13 (c.251G>A, p.Gly84Glu), HRAS, KIT, MAX, MEN1, MET, MITF (c.952G>A, p.Glu318Lys variant only), MLH1, MSH2, MSH3, MSH6, MUTYH, NBN, NF1, NF2, NTHL1, PALB2, PDGFRA, PHOX2B, PMS2, POLD1, POLE, POT1, PRKAR1A, PTCH1, PTEN, RAD50, RAD51C, RAD51D, RB1, RECQL4, RET, RUNX1, SDHAF2, SDHA (sequence changes only), SDHB, SDHC, SDHD, SMAD4, SMARCA4, SMARCB1, SMARCE1, STK11, SUFU, TERC, TERT, TMEM127, TP53, TSC1, TSC2, VHL, WRN and WT1.     04/29/2021 PET scan   1. No vulvar mass or residual hypermetabolism. 2. No findings suspicious for metastatic disease.   11/28/2021 Pathology Results   Vulvar biopsy - fibrosis c/w scar. Small junctional nevus. No melanoma identified.   08/31/2022 PET scan   1. Enlarging RIGHT inguinal lymph node suspicious for metastatic disease. This shows only mild to moderate increased metabolic activity but displays FDG uptake greater than blood pool and shows abnormal morphology. Tissue sampling may be helpful. 2. No additional signs of disease in the neck, chest, abdomen or pelvis. 3. Signs of pelvic floor dysfunction and probable cystocele. 4. Chronic fracture of RIGHT inferior pubic ramus signs of healing since previous imaging. 5. Aortic atherosclerosis. 6. Moderate to large hiatal hernia.   Aortic Atherosclerosis (ICD10-I70.0).   09/04/2022 Pathology Results   FINAL MICROSCOPIC DIAGNOSIS:   A. LYMPH NODE, RIGHT INGUINAL, BIOPSY:  Metastatic melanoma.  See comment.   COMMENT:  The tumor cells are loosely cohesive with foci of cytoplasmic pigment. Immunohistochemistry is positive with Melan-A, S100 and SOX10.  The morphology and immunophenotype are consistent with metastatic melanoma.        Interval History: Patient reports overall doing well.  Tolerated recent biopsy.  Denies any vulvar symptoms including irritation, pruritus,  bleeding, or discharge.  Endorses normal bowel and bladder function.  Past Medical/Surgical History: Past Medical History:  Diagnosis Date   Anemia    BMI 34.0-34.9,adult    Family history of melanoma    Melanotic macule of vulvar labia    Migraine    Polymyalgia rheumatica (H[facility])    Venous insufficiency     Past Surgical History:  Procedure Laterality Date   Cataract Right 05/22/2022   CATARACT EXTRACTION Left 06/05/2022   COLONOSCOPY      Family History  Problem Relation Age of Onset   Diabetes Mother    AVM Father    Melanoma Father        multiple melanomas dx between 25-90   Cancer Father 65       tumor on kidney   Cancer Paternal Grandfather        laryngeal cancer   Cancer Daughter 50       appendiceal cancer   Breast cancer Neg Hx    Ovarian cancer Neg Hx    Colon cancer Neg Hx    Uterine cancer Neg Hx    Colon polyps Neg Hx    Esophageal cancer Neg Hx    Stomach cancer Neg Hx    Rectal cancer Neg Hx     Social History   Socioeconomic History   Marital status: Married    Spouse name: Not on file   Number of children: 2   Years of education: Not on file   Highest education level: Not on file  Occupational History   Occupation: shelf clerk   Occupation: stocking  Tobacco Use   Smoking status: Never   Smokeless tobacco: Never  Vaping Use  Vaping Use: Never used  Substance and Sexual Activity   Alcohol use: Yes    Comment: occasional   Drug use: No   Sexual activity: Not Currently  Other Topics Concern   Not on file  Social History Narrative   Not on file   Social Determinants of Health   Financial Resource Strain: Not on file  Food Insecurity: Not on file  Transportation Needs: Not on file  Physical Activity: Not on file  Stress: Not on file  Social Connections: Not on file    Current Medications:  Current Outpatient Medications:    senna-docusate (SENOKOT-S) 8.6-50 MG tablet, Take 2 tablets by mouth at bedtime. For after surgery,  do not take if having diarrhea, Disp: 30 tablet, Rfl: 0   traMADol (ULTRAM) 50 MG tablet, Take 1 tablet (50 mg total) by mouth every 6 (six) hours as needed for severe pain. For AFTER surgery, do not take and drive, Disp: 10 tablet, Rfl: 0   Difluprednate 0.05 % EMUL, Place 1 drop into the right eye 4 (four) times daily., Disp: , Rfl:    hydrochlorothiazide (HYDRODIURIL) 25 MG tablet, TAKE 1 TABLET BY MOUTH DAILY AS NEEDED., Disp: 90 tablet, Rfl: 3   ketorolac (ACULAR) 0.5 % ophthalmic solution, Place 1 drop into the right eye 4 (four) times daily., Disp: , Rfl:    KLOR-CON M20 20 MEQ tablet, TAKE 1 TABLET BY MOUTH EVERY DAY, Disp: 90 tablet, Rfl: 1   moxifloxacin (VIGAMOX) 0.5 % ophthalmic solution, Place 1 drop into the left eye 4 (four) times daily., Disp: , Rfl:    Multiple Vitamins-Minerals (MULTIVITAMIN WITH MINERALS) tablet, Take 1 tablet by mouth daily., Disp: , Rfl:    predniSONE (DELTASONE) 10 MG tablet, TAKE 1 TABLET (10 MG TOTAL) BY MOUTH DAILY WITH BREAKFAST., Disp: 30 tablet, Rfl: 0  Review of Systems: Denies appetite changes, fevers, chills, fatigue, unexplained weight changes. Denies hearing loss, neck lumps or masses, mouth sores, ringing in ears or voice changes. Denies cough or wheezing.  Denies shortness of breath. Denies chest pain or palpitations. Denies leg swelling. Denies abdominal distention, pain, blood in stools, constipation, diarrhea, nausea, vomiting, or early satiety. Denies pain with intercourse, dysuria, frequency, hematuria or incontinence. Denies hot flashes, pelvic pain, vaginal bleeding or vaginal discharge.   Denies joint pain, back pain or muscle pain/cramps. Denies itching, rash, or wounds. Denies dizziness, headaches, numbness or seizures. Denies swollen lymph nodes or glands, denies easy bruising or bleeding. Denies anxiety, depression, confusion, or decreased concentration.  Physical Exam: BP 130/72 (BP Location: Left Arm, Patient Position:  Sitting)   Pulse 64   Temp 98.2 F (36.8 C) (Oral)   Resp 16   Ht _0  (1.575 m)   Wt 186 lb 6 oz (84.5 kg)   SpO2 98%   BMI 34.09 kg/m  General: Alert, oriented, no acute distress. HEENT: Normocephalic, atraumatic, sclera anicteric. Chest: Clear to auscultation bilaterally.  No wheezes or rhonchi. Cardiovascular: Regular rate and rhythm, no murmurs. Abdomen: Obese, soft, nontender.  Normoactive bowel sounds.  No masses or hepatosplenomegaly appreciated.   Extremities: Grossly normal range of motion.  Warm, well perfused.  No edema bilaterally. Skin: No rashes or lesions noted. Lymphatics: Palpable, 2 cm somewhat mobile right inguinal lymph node. GU: External female genitalia notable for changes from prior surgery along the right vulva.  There is mild discoloration along the most inferior aspect of the incision, similar to what is has been.  There is some purple hue of  the lateral aspect of the labia, suspected to be bruising that drained from her recent right groin biopsy procedure.  There is similar bruising over the skin of the right groin.  No other areas of discoloration, lesions, or atypical vasculature.    Laboratory & Radiologic Studies: Pathology 09/04/22:  A. LYMPH NODE, RIGHT INGUINAL, BIOPSY:  Metastatic melanoma.  See comment.   COMMENT:  The tumor cells are loosely cohesive with foci of cytoplasmic pigment.  Immunohistochemistry is positive with Melan-A, S100 and SOX10.  The  morphology and immunophenotype are consistent with metastatic melanoma.   PET 08/31/22: IMPRESSION: 1. Enlarging RIGHT inguinal lymph node suspicious for metastatic disease. This shows only mild to moderate increased metabolic activity but displays FDG uptake greater than blood pool and shows abnormal morphology. Tissue sampling may be helpful. 2. No additional signs of disease in the neck, chest, abdomen or pelvis. 3. Signs of pelvic floor dysfunction and probable cystocele. 4. Chronic  fracture of RIGHT inferior pubic ramus signs of healing since previous imaging. 5. Aortic atherosclerosis. 6. Moderate to large hiatal hernia.  Groin ultrasound 11/15: IMPRESSION: 1. Cholelithiasis in an otherwise normal appearing gallbladder. 2. The common bile duct is normal. 3. A morphologically abnormal lymph node is identified in the right groin measuring 1.9 x 1.7 x 1.4 cm with a thickened cortex and restriction of the fatty hilum. The finding is concerning for a metastatic node given history.  Assessment & Plan: Ariel Rios is a 66 y.o. woman with recurrent vulvar melanoma, now with biopsy-proven involved right inguinal lymph node.  The patient and I reviewed recent biopsy results, which we had discussed earlier this week on the phone.  Biopsy of her enlarged and FDG avid lymph node in her right groin is positive for metastatic melanoma.  There are no other FDG avid or enlarged lymph nodes on imaging.  Given this, I have recommended surgery for debulking of this lymph node and possible excision of surrounding lymph nodes.  The patient tolerated immunotherapy very poorly after her initial surgery.  We discussed that typically, whether or not surgery is pursued in this setting, recommendation would be for treatment with immunotherapy.  The patient is willing to consider trial of immunotherapy again.  We discussed the plan for right inguinal lymph node dissection, possible vulvar biopsies, any other indicated procedures.  We discussed the risks of surgery which include but are not limited to bleeding, need for blood transfusion, infection, damage to surrounding structures requiring repair, possible need for future hospitalizations or surgeries, VTE, anesthesia risk such as stroke or heart attack, rare death.  Discussed given extent of dissection, that drain may be placed at the time of surgery and left in for several weeks to help prevent collection of lymphatic fluid.  Discussed the  risk related to lower extremity lymphedema after lymph node removal.  Perioperative education was performed today.  Postoperative medications were sent to the patient's pharmacy.  24 minutes of total time was spent for this patient encounter, including preparation, face-to-face counseling with the patient and coordination of care, and documentation of the encounter.  [name], MD  Division of Gynecologic Oncology  Department of Obstetrics and Gynecology  [facility]

## 2022-09-08 NOTE — Patient Instructions (Addendum)
Preparing for your Surgery  Plan for surgery on September 27, 2021 with Dr. [name] at [facility] will be scheduled for right inguinal lymph node debulking with possible drain placement.   Pre-operative Testing -You will receive a phone call from presurgical testing at [facility] to arrange for a pre-operative appointment and lab work.  -Bring your insurance card, copy of an advanced directive if applicable, medication list  -At that visit, you will be asked to sign a consent for a possible blood transfusion in case a transfusion becomes necessary during surgery.  The need for a blood transfusion is rare but having consent is a necessary part of your care.     -You should not be taking blood thinners or aspirin at least ten days prior to surgery unless instructed by your surgeon.  -Do not take supplements such as fish oil (omega 3), red yeast rice, turmeric before your surgery. You want to avoid medications with aspirin in them including headache powders such as BC or Goody's), Excedrin migraine.  Day Before Surgery at [facility] will be advised you can have clear liquids up until 3 hours before your surgery.    Your role in recovery We may plan on getting you home the same day. We will see how you are doing.  Your role is to become active as soon as directed by your doctor, while still giving yourself time to heal.  Rest when you feel tired. You will be asked to do the following in order to speed your recovery:  - Cough and breathe deeply. This helps to clear and expand your lungs and can prevent pneumonia after surgery.  - [place]. Do mild physical activity. Walking or moving your legs help your circulation and body functions return to normal. Do not try to get up or walk alone the first time after surgery.   -If you develop swelling on one leg or the other, pain in the back of your leg, redness/warmth in one of your legs, please call the  office or go to the Emergency Room to have a doppler to rule out a blood clot. For shortness of breath, chest pain-seek care in the Emergency Room as soon as possible. - Actively manage your pain. Managing your pain lets you move in comfort. We will ask you to rate your pain on a scale of zero to 10. It is your responsibility to tell your doctor or nurse where and how much you hurt so your pain can be treated.  Special Considerations -If you are diabetic, you may be placed on insulin after surgery to have closer control over your blood sugars to promote healing and recovery.  This does not mean that you will be discharged on insulin.  If applicable, your oral antidiabetics will be resumed when you are tolerating a solid diet.  -Your final pathology results from surgery should be available around one week after surgery and the results will be relayed to you when available.  -FMLA forms can be faxed to [phone] and please allow 5-7 business days for completion.  Pain Management After Surgery -You have been prescribed your pain medication and bowel regimen medications before surgery so that you can have these available when you are discharged from the hospital. The pain medication is for use ONLY AFTER surgery and a new prescription will not be given.   -Make sure that you have Tylenol and Ibuprofen IF YOU ARE ABLE TO TAKE  THESE MEDICATIONS at home to use on a regular basis after surgery for pain control. We recommend alternating the medications every hour to six hours since they work differently and are processed in the body differently for pain relief.  -Review the attached handout on narcotic use and their risks and side effects.   Bowel Regimen -You have been prescribed Sennakot-S to take nightly to prevent constipation especially if you are taking the narcotic pain medication intermittently.  It is important to prevent constipation and drink adequate amounts of liquids. You can stop taking  this medication when you are not taking pain medication and you are back on your normal bowel routine.  Risks of Surgery Risks of surgery are low but include bleeding, infection, damage to surrounding structures, re-operation, blood clots, and very rarely death.   Blood Transfusion Information (For the consent to be signed before surgery)  We will be checking your blood type before surgery so in case of emergencies, we will know what type of blood you would need.                                            WHAT IS A BLOOD TRANSFUSION?  A transfusion is the replacement of blood or some of its parts. Blood is made up of multiple cells which provide different functions. Red blood cells carry oxygen and are used for blood loss replacement. White blood cells fight against infection. Platelets control bleeding. Plasma helps clot blood. Other blood products are available for specialized needs, such as hemophilia or other clotting disorders. BEFORE THE TRANSFUSION  Who gives blood for transfusions?  You may be able to donate blood to be used at a later date on yourself (autologous donation). Relatives can be asked to donate blood. This is generally not any safer than if you have received blood from a stranger. The same precautions are taken to ensure safety when a relative's blood is donated. Healthy volunteers who are fully evaluated to make sure their blood is safe. This is blood bank blood. Transfusion therapy is the safest it has ever been in the practice of medicine. Before blood is taken from a donor, a complete history is taken to make sure that person has no history of diseases nor engages in risky social behavior (examples are intravenous drug use or sexual activity with multiple partners). The donor's travel history is screened to minimize risk of transmitting infections, such as malaria. The donated blood is tested for signs of infectious diseases, such as HIV and hepatitis. The blood is  then tested to be sure it is compatible with you in order to minimize the chance of a transfusion reaction. If you or a relative donates blood, this is often done in anticipation of surgery and is not appropriate for emergency situations. It takes many days to process the donated blood. RISKS AND COMPLICATIONS Although transfusion therapy is very safe and saves many lives, the main dangers of transfusion include:  Getting an infectious disease. Developing a transfusion reaction. This is an allergic reaction to something in the blood you were given. Every precaution is taken to prevent this. The decision to have a blood transfusion has been considered carefully by your caregiver before blood is given. Blood is not given unless the benefits outweigh the risks.  AFTER SURGERY INSTRUCTIONS  Return to work: 4-6 weeks if applicable  Activity: 1. Be  up and out of the bed during the day.  Take a nap if needed.  You may walk up steps but be careful and use the hand rail.  Stair climbing will tire you more than you think, you may need to stop part way and rest.   2. No lifting or straining for 6 weeks over 10 pounds. No pushing, pulling, straining for 6 weeks.  3. No driving for around 1 week(s).  Do not drive if you are taking narcotic pain medicine and make sure that your reaction time has returned.   4. You can shower as soon as the next day after surgery. Shower daily.  Use your regular soap and water (not directly on the incision) and pat your incision(s) dry afterwards; don't rub.  No tub baths or submerging your body in water until cleared by your surgeon. If you have the soap that was given to you by pre-surgical testing that was used before surgery, you do not need to use it afterwards because this can irritate your incisions.   5. No sexual activity and nothing in the vagina for 4 weeks.  6. You may experience a small amount of clear drainage from your incision, which is normal.  If the  drainage persists, increases, or changes color please call the office.  7. Do not use creams, lotions, or ointments such as neosporin on your incisions after surgery until advised by your surgeon because they can cause removal of the dermabond glue on your incisions.    8. Take Tylenol or ibuprofen first for pain if you are able to take these medications and only use narcotic pain medication for severe pain not relieved by the Tylenol or Ibuprofen.  Monitor your Tylenol intake to a max of 4,000 mg in a 24 hour period. You can alternate these medications after surgery.  Diet: 1. Low sodium Heart Healthy Diet is recommended but you are cleared to resume your normal (before surgery) diet after your procedure.  2. It is safe to use a laxative, such as Miralax or Colace, if you have difficulty moving your bowels. You have been prescribed Sennakot-S to take at bedtime every evening after surgery to keep bowel movements regular and to prevent constipation.    Wound Care: 1. Keep clean and dry.  Shower daily.  Reasons to call the Doctor: Fever - Oral temperature greater than 100.4 degrees Fahrenheit Foul-smelling vaginal discharge Difficulty urinating Nausea and vomiting Increased pain at the site of the incision that is unrelieved with pain medicine. Difficulty breathing with or without chest pain New calf pain especially if only on one side Sudden, continuing increased vaginal bleeding with or without clots.   Contacts: For questions or concerns you should contact:  Dr. [name] at [phone]  Joylene John, NP at [phone]  After Hours: call [phone] and have the GYN Oncologist paged/contacted (after 5 pm or on the weekends).  Messages sent via mychart are for non-urgent matters and are not responded to after hours so for urgent needs, please call the after hours number.

## 2022-09-08 NOTE — Progress Notes (Signed)
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  She denies pain   12/07/2020 Initial Biopsy   FINAL MICROSCOPIC DIAGNOSIS:   A.   RIGHT VULVA, BIOPSY: PRIMARY MALIGNANT MELANOMA OF VULVA, MELANOMA  TABLE BELOW  MALIGNANT MELANOMA (AJCC 8TH EDITION):  PROCEDURE: BIOPSY  SPECIMEN ANATOMIC SITE: RIGHT VULVA  BRESLOW'S DEPTH/MAXIMUM TUMOR THICKNESS: 5.05 MM  CLARK/ANATOMIC LEVEL* IV  MARGINS:  PERIPHERAL: INVOLVED  DEEP: FREE  ULCERATION: PRESENT  MITOTIC INDEX: 11/mm2  LYMPHO-VASCULAR INVASION: PRESENT*  NEUTROTROPISM: ABSENT  TUMOR-INFILTRATING LYMPHOCYTES: NON-BRISK  LYMPH NODES: N/A  PATHOLOGIC STAGE: PT4B NX MX  *VULVAR MELANOMAS MAY NOT HAVE THE SAME BEHAVIOR STAGE FOR STAGE AS SUPERFICIAL SPREADING CUTANEOUS MELANOMA, AND LANDMARKS SUCH AS BRESLOW'S DEPTH MAY NOT APPLY. NONETHELESS, THIS SHOULD BE TREATED AS A PT4B MELANOMA AND COMPLETE EXCISION, WITH POSSIBLE LYMPH NODE EVALUATION, UNDERTAKEN. SEE COMMENT FOR IHC PROFILE.    COMMENT:  Sections show confluent and nested growth of atypical melanocytes along vulvar epithelium in an in-situ melanoma growth pattern at the periphery, with a central large ulcerated nodule of similar atypical  melanocytes, consonant with primary vulvar melanoma. Notably, IHC was performed to evaluate the lineage and is diagnostic for melanoma. Lesional melanocytes are positive with S100, MelanA, and show a high ki-67 uptake, and are negative with CDX-2, synaptophysin, CK20, TTF-1, CK7, CD56, and lymphoid markers CD5, CD3, CD20 highlight only background lymphocytes.    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 The only potential hypermetabolic finding of note is a small flat focus of accentuated metabolic activity along the anterior vaginal vestibule/perineum, maximum SUV 6.5. Most common cause for this type of appearance would be a trace amount of incontinence of urinary FDG, but given the proximity to the reported vulvar melanoma, this small focus is considered indeterminate. 2. Other imaging findings of potential clinical significance: Aortic Atherosclerosis (ICD10-I70.0). Small type 1 hiatal hernia. Palatine tonsilloliths. Colonic diverticulosis.   01/18/2021 Surgery   Preoperative Diagnosis: Vulvar melanoma  Postoperative Diagnosis: Same  Procedures performed: Wide radical vulvectomy, R inguinofemoral sentinel LN biopsy  Attending Surgeon: [name], MD  Fellow: [name]  Resident: Melony Overly, MD; Darrin Nipper, MD  Anesthesia: General endotracheal  Findings:  1. Vulva with biopsy site changes at R labia minora adjacent to the clitoris. No visible tumor. 2. Inguinofemoral nodes not clinically enlarged or abnormal.   Specimens:  1. R inguinal sentinel LN, hot and blue 2. Portion of vulva 3. Superior deep margin    01/18/2021 Pathology Results   Only melanoma in situ seen in final resection, SLN negative   02/08/2021 - 03/22/2021 Chemotherapy         03/29/2021 Genetic Testing   Negative genetic testing on the Multi-cancer +RNA panel.  The report date is March 29, 2021.  The Multi-Gene Panel offered by [facility] includes sequencing and/or deletion duplication testing of the following 84 genes: AIP, ALK, APC, ATM, AXIN2,BAP1,  BARD1, BLM, BMPR1A, BRCA1, BRCA2, BRIP1, CASR, CDC73, CDH1, CDK4, CDKN1B, CDKN1C, CDKN2A (p14ARF), CDKN2A (  p16INK4a), CEBPA, CHEK2, CTNNA1, DICER1, DIS3L2, EGFR (c.2369C>T, p.Thr790Met  variant only), EPCAM (Deletion/duplication testing only), FH, FLCN, GATA2, GPC3, GREM1 (Promoter region deletion/duplication testing only), HOXB13 (c.251G>A, p.Gly84Glu), HRAS, KIT, MAX, MEN1, MET, MITF (c.952G>A, p.Glu318Lys variant only), MLH1, MSH2, MSH3, MSH6, MUTYH, NBN, NF1, NF2, NTHL1, PALB2, PDGFRA, PHOX2B, PMS2, POLD1, POLE, POT1, PRKAR1A, PTCH1, PTEN, RAD50, RAD51C, RAD51D, RB1, RECQL4, RET, RUNX1, SDHAF2, SDHA (sequence changes only), SDHB, SDHC, SDHD, SMAD4, SMARCA4, SMARCB1, SMARCE1, STK11, SUFU, TERC, TERT, TMEM127, TP53, TSC1, TSC2, VHL, WRN and WT1.     04/29/2021 PET scan   1. No vulvar mass or residual hypermetabolism. 2. No findings suspicious for metastatic disease.   11/28/2021 Pathology Results   Vulvar biopsy - fibrosis c/w scar. Small junctional nevus. No melanoma identified.   08/31/2022 PET scan   1. Enlarging RIGHT inguinal lymph node suspicious for metastatic disease. This shows only mild to moderate increased metabolic activity but displays FDG uptake greater than blood pool and shows abnormal morphology. Tissue sampling may be helpful. 2. No additional signs of disease in the neck, chest, abdomen or pelvis. 3. Signs of pelvic floor dysfunction and probable cystocele. 4. Chronic fracture of RIGHT inferior pubic ramus signs of healing since previous imaging. 5. Aortic atherosclerosis. 6. Moderate to large hiatal hernia.   Aortic Atherosclerosis (ICD10-I70.0).   09/04/2022 Pathology Results   FINAL MICROSCOPIC DIAGNOSIS:   A. LYMPH NODE, RIGHT INGUINAL, BIOPSY:  Metastatic melanoma.  See comment.   COMMENT:  The tumor cells are loosely cohesive with foci of cytoplasmic pigment. Immunohistochemistry is positive with Melan-A, S100 and SOX10.  The morphology and immunophenotype are consistent with metastatic melanoma.        Interval History: Patient reports overall doing well.  Tolerated recent biopsy.  Denies any vulvar symptoms including irritation, pruritus,  bleeding, or discharge.  Endorses normal bowel and bladder function.  Past Medical/Surgical History: Past Medical History:  Diagnosis Date   Anemia    BMI 34.0-34.9,adult    Family history of melanoma    Melanotic macule of vulvar labia    Migraine    Polymyalgia rheumatica ([facility])    Venous insufficiency     Past Surgical History:  Procedure Laterality Date   Cataract Right 05/22/2022   CATARACT EXTRACTION Left 06/05/2022   COLONOSCOPY      Family History  Problem Relation Age of Onset   Diabetes Mother    AVM Father    Melanoma Father        multiple melanomas dx between 25-90   Cancer Father 65       tumor on kidney   Cancer Paternal Grandfather        laryngeal cancer   Cancer Daughter 50       appendiceal cancer   Breast cancer Neg Hx    Ovarian cancer Neg Hx    Colon cancer Neg Hx    Uterine cancer Neg Hx    Colon polyps Neg Hx    Esophageal cancer Neg Hx    Stomach cancer Neg Hx    Rectal cancer Neg Hx     Social History   Socioeconomic History   Marital status: Married    Spouse name: Not on file   Number of children: 2   Years of education: Not on file   Highest education level: Not on file  Occupational History   Occupation: shelf clerk   Occupation: stocking  Tobacco Use   Smoking status: Never   Smokeless tobacco: Never  Vaping Use  Vaping Use: Never used  Substance and Sexual Activity   Alcohol use: Yes    Comment: occasional   Drug use: No   Sexual activity: Not Currently  Other Topics Concern   Not on file  Social History Narrative   Not on file   Social Determinants of Health   Financial Resource Strain: Not on file  Food Insecurity: Not on file  Transportation Needs: Not on file  Physical Activity: Not on file  Stress: Not on file  Social Connections: Not on file    Current Medications:  Current Outpatient Medications:    senna-docusate (SENOKOT-S) 8.6-50 MG tablet, Take 2 tablets by mouth at bedtime. For after surgery,  do not take if having diarrhea, Disp: 30 tablet, Rfl: 0   traMADol (ULTRAM) 50 MG tablet, Take 1 tablet (50 mg total) by mouth every 6 (six) hours as needed for severe pain. For AFTER surgery, do not take and drive, Disp: 10 tablet, Rfl: 0   Difluprednate 0.05 % EMUL, Place 1 drop into the right eye 4 (four) times daily., Disp: , Rfl:    hydrochlorothiazide (HYDRODIURIL) 25 MG tablet, TAKE 1 TABLET BY MOUTH DAILY AS NEEDED., Disp: 90 tablet, Rfl: 3   ketorolac (ACULAR) 0.5 % ophthalmic solution, Place 1 drop into the right eye 4 (four) times daily., Disp: , Rfl:    KLOR-CON M20 20 MEQ tablet, TAKE 1 TABLET BY MOUTH EVERY DAY, Disp: 90 tablet, Rfl: 1   moxifloxacin (VIGAMOX) 0.5 % ophthalmic solution, Place 1 drop into the left eye 4 (four) times daily., Disp: , Rfl:    Multiple Vitamins-Minerals (MULTIVITAMIN WITH MINERALS) tablet, Take 1 tablet by mouth daily., Disp: , Rfl:    predniSONE (DELTASONE) 10 MG tablet, TAKE 1 TABLET (10 MG TOTAL) BY MOUTH DAILY WITH BREAKFAST., Disp: 30 tablet, Rfl: 0  Review of Systems: Denies appetite changes, fevers, chills, fatigue, unexplained weight changes. Denies hearing loss, neck lumps or masses, mouth sores, ringing in ears or voice changes. Denies cough or wheezing.  Denies shortness of breath. Denies chest pain or palpitations. Denies leg swelling. Denies abdominal distention, pain, blood in stools, constipation, diarrhea, nausea, vomiting, or early satiety. Denies pain with intercourse, dysuria, frequency, hematuria or incontinence. Denies hot flashes, pelvic pain, vaginal bleeding or vaginal discharge.   Denies joint pain, back pain or muscle pain/cramps. Denies itching, rash, or wounds. Denies dizziness, headaches, numbness or seizures. Denies swollen lymph nodes or glands, denies easy bruising or bleeding. Denies anxiety, depression, confusion, or decreased concentration.  Physical Exam: BP 130/72 (BP Location: Left Arm, Patient Position:  Sitting)   Pulse 64   Temp 98.2 F (36.8 C) (Oral)   Resp 16   Ht _0  (1.575 m)   Wt 186 lb 6 oz (84.5 kg)   SpO2 98%   BMI 34.09 kg/m  General: Alert, oriented, no acute distress. HEENT: Normocephalic, atraumatic, sclera anicteric. Chest: Clear to auscultation bilaterally.  No wheezes or rhonchi. Cardiovascular: Regular rate and rhythm, no murmurs. Abdomen: Obese, soft, nontender.  Normoactive bowel sounds.  No masses or hepatosplenomegaly appreciated.   Extremities: Grossly normal range of motion.  Warm, well perfused.  No edema bilaterally. Skin: No rashes or lesions noted. Lymphatics: Palpable, 2 cm somewhat mobile right inguinal lymph node. GU: External female genitalia notable for changes from prior surgery along the right vulva.  There is mild discoloration along the most inferior aspect of the incision, similar to what is has been.  There is some purple hue of  the lateral aspect of the labia, suspected to be bruising that drained from her recent right groin biopsy procedure.  There is similar bruising over the skin of the right groin.  No other areas of discoloration, lesions, or atypical vasculature.    Laboratory & Radiologic Studies: Pathology 09/04/22:  A. LYMPH NODE, RIGHT INGUINAL, BIOPSY:  Metastatic melanoma.  See comment.   COMMENT:  The tumor cells are loosely cohesive with foci of cytoplasmic pigment.  Immunohistochemistry is positive with Melan-A, S100 and SOX10.  The  morphology and immunophenotype are consistent with metastatic melanoma.   PET 08/31/22: IMPRESSION: 1. Enlarging RIGHT inguinal lymph node suspicious for metastatic disease. This shows only mild to moderate increased metabolic activity but displays FDG uptake greater than blood pool and shows abnormal morphology. Tissue sampling may be helpful. 2. No additional signs of disease in the neck, chest, abdomen or pelvis. 3. Signs of pelvic floor dysfunction and probable cystocele. 4. Chronic  fracture of RIGHT inferior pubic ramus signs of healing since previous imaging. 5. Aortic atherosclerosis. 6. Moderate to large hiatal hernia.  Groin ultrasound 11/15: IMPRESSION: 1. Cholelithiasis in an otherwise normal appearing gallbladder. 2. The common bile duct is normal. 3. A morphologically abnormal lymph node is identified in the right groin measuring 1.9 x 1.7 x 1.4 cm with a thickened cortex and restriction of the fatty hilum. The finding is concerning for a metastatic node given history.  Assessment & Plan: Ariel Rios is a 66 y.o. woman with recurrent vulvar melanoma, now with biopsy-proven involved right inguinal lymph node.  The patient and I reviewed recent biopsy results, which we had discussed earlier this week on the phone.  Biopsy of her enlarged and FDG avid lymph node in her right groin is positive for metastatic melanoma.  There are no other FDG avid or enlarged lymph nodes on imaging.  Given this, I have recommended surgery for debulking of this lymph node and possible excision of surrounding lymph nodes.  The patient tolerated immunotherapy very poorly after her initial surgery.  We discussed that typically, whether or not surgery is pursued in this setting, recommendation would be for treatment with immunotherapy.  The patient is willing to consider trial of immunotherapy again.  We discussed the plan for right inguinal lymph node dissection, possible vulvar biopsies, any other indicated procedures.  We discussed the risks of surgery which include but are not limited to bleeding, need for blood transfusion, infection, damage to surrounding structures requiring repair, possible need for future hospitalizations or surgeries, VTE, anesthesia risk such as stroke or heart attack, rare death.  Discussed given extent of dissection, that drain may be placed at the time of surgery and left in for several weeks to help prevent collection of lymphatic fluid.  Discussed the  risk related to lower extremity lymphedema after lymph node removal.  Perioperative education was performed today.  Postoperative medications were sent to the patient's pharmacy.  24 minutes of total time was spent for this patient encounter, including preparation, face-to-face counseling with the patient and coordination of care, and documentation of the encounter.  [name], MD  Division of Gynecologic Oncology  Department of Obstetrics and Gynecology  [facility]

## 2022-09-11 ENCOUNTER — Telehealth: Payer: Self-pay

## 2022-09-11 IMAGING — PT NM PET TUM IMG INITIAL (PI) WHOLE BODY
1 series · 2 of 2 positions shown · non-contrast
Comparison: CT abdomen 03/12/2011

CLINICAL DATA: Initial treatment strategy for vulvar melanoma.

EXAM:
NUCLEAR MEDICINE PET WHOLE BODY
TECHNIQUE: 9.5 mCi F-18 FDG was injected intravenously. Full-ring PET imaging
was performed from the head to foot after the radiotracer. CT data
was obtained and used for attenuation correction and anatomic
localization.
Fasting blood glucose: 88 mg/dl

[Series 1095: results mm oncology reading · 5.0mm · 0.61mm/px · 2 of 2 slices shown]
[im 1/2]
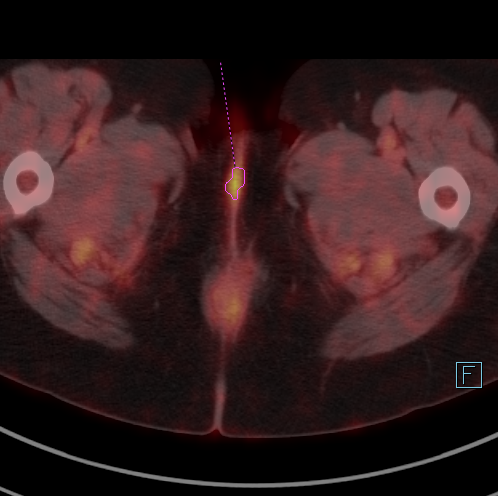
[im 2/2]
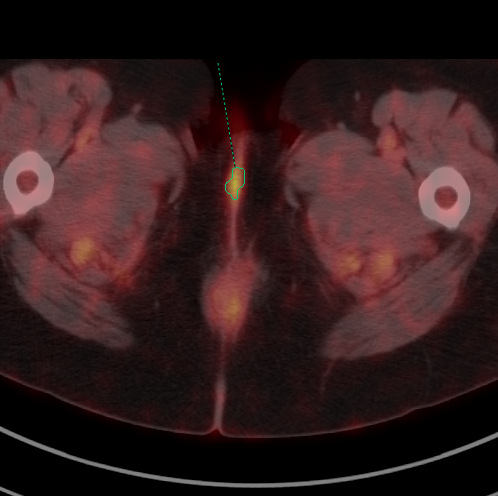

[2 of 2 positions shown; findings below may reference images not displayed]

FINDINGS: Mediastinal blood pool activity: SUV max

HEAD/NECK: No significant abnormal hypermetabolic activity in this
region.

Incidental CT findings: Speckled dystrophic calcifications along the
palatine tonsils (tonsilloliths).

CHEST: No significant abnormal hypermetabolic activity in this
region.

Incidental CT findings: Atherosclerotic calcification of the aortic
arch. Small type 1 hiatal hernia.

ABDOMEN/PELVIS: Photopenic cyst in the left hepatic lobe.

Along the vestibule/perineum in the midline, a small focus of a
accentuated activity is present maximum SUV 6.5 without CT
abnormality. This could well be due to a trace amount of
incontinence of urinary FDG, the given the proximity to the reported
vulvar melanoma, this small flat focus of activity is considered
indeterminate. No regional hypermetabolic adenopathy.

Incidental CT findings: Aortoiliac atherosclerotic vascular disease.
Renal peripelvic cysts, left greater than right. Fairly widespread
colonic diverticulosis most concentrated in the descending and
sigmoid colon.

SKELETON: No significant abnormal hypermetabolic activity in this
region.

Incidental CT findings: Thoracic spondylosis.

EXTREMITIES: No significant abnormal hypermetabolic activity in this
region.

Incidental CT findings: none
IMPRESSION: 1. The only potential hypermetabolic finding of note is a small flat
focus of accentuated metabolic activity along the anterior vaginal
vestibule/perineum, maximum SUV 6.5. Most common cause for this type
of appearance would be a trace amount of incontinence of urinary
FDG, but given the proximity to the reported vulvar melanoma, this
small focus is considered indeterminate.
2. Other imaging findings of potential clinical significance: Aortic
Atherosclerosis ([id number]-ON4.4). Small type 1 hiatal hernia. Palatine
tonsilloliths. Colonic diverticulosis.

## 2022-09-11 NOTE — Progress Notes (Signed)
COVID Vaccine received:  _0  No _1  Yes Date of any COVID positive Test in last 90 days: None  PCP - Ariel [name], MD Cardiologist - none  Chest x-ray -  EKG - 10-19-2018     no reason to repeat at PST  Stress Test -  ECHO -  Cardiac Cath -   PCR screen: _2  Ordered & Completed                      _3   No Order but Needs PROFEND                      _4   N/A for this surgery  Surgery Plan:  _5  Ambulatory                            _6  Outpatient in bed                            _7  Admit  Anesthesia:    _8  General  _9  Spinal                           _10   Choice _11   MAC  Pacemaker / ICD device _12  No _13  Yes        Device order form faxed _14  No    _15   Yes      Faxed to:  Spinal Cord Stimulator:_16  No _17  Yes      (Remind patient to bring remote DOS) Other Implants:   History of Sleep Apnea? _18  No _19  Yes   CPAP used?- _20  No _21  Yes    Does the patient monitor blood sugar? _22  No _23  Yes  _24  N/A  Blood Thinner / Instructions: none Aspirin Instructions: None  ERAS Protocol Ordered: _25  No  _26  Yes PRE-SURGERY _27  ENSURE  _28  G2  _29  No Drink Ordered Patient is to be NPO after: 08:30 am  Comments:   Activity level: Patient can not climb a flight of stairs without difficulty; _30  No CP  _31  No SOB.  Patient can perform ADLs without assistance.   Anesthesia review: anemia, migraines  Patient denies shortness of breath, fever, cough and chest pain at PAT appointment.  Patient verbalized understanding and agreement to the Pre-Surgical Instructions that were given to them at this PAT appointment. Patient was also educated of the need to review these PAT instructions again prior to his/her surgery.I reviewed the appropriate phone numbers to call if they have any and questions or concerns.

## 2022-09-11 NOTE — Patient Instructions (Signed)
SURGICAL WAITING ROOM VISITATION Patients having surgery or a procedure may have no more than 2 support people in the waiting area - these visitors may rotate in the visitor waiting room.   Children under the age of 18 must have an adult with them who is not the patient. If the patient needs to stay at the hospital during part of their recovery, the visitor guidelines for inpatient rooms apply.  PRE-OP VISITATION  Pre-op nurse will coordinate an appropriate time for 1 support person to accompany the patient in pre-op.  This support person may not rotate.  This visitor will be contacted when the time is appropriate for the visitor to come back in the pre-op area.  Please refer to the [facility] website for the visitor guidelines for Inpatients (after your surgery is over and you are in a regular room).  You are not required to quarantine at this time prior to your surgery. However, you must do this: Hand Hygiene often Do NOT share personal items Notify your provider if you are in close contact with someone who has COVID or you develop fever 100.4 or greater, new onset of sneezing, cough, sore throat, shortness of breath or body aches.  If you test positive for Covid or have been in contact with anyone that has tested positive in the last 10 days please notify you surgeon.    Your procedure is scheduled on:  Wednesday  September 27, 2021  Report to [facility] Main Entrance: [address] entrance where the [employer] is available.   Report to admitting at: 09:15  AM  +++++Call this number if you have any questions or problems the morning of surgery [phone]  Do not eat food after Midnight the night prior to your surgery/procedure.  After Midnight you may have the following liquids until 08:30 AM DAY OF SURGERY  Clear Liquid Diet Water Black Coffee (sugar ok, NO MILK/CREAM OR CREAMERS)  Tea (sugar ok, NO MILK/CREAM OR CREAMERS) regular and decaf                              Plain Jell-O  with no fruit (NO RED)                                           Fruit ices (not with fruit pulp, NO RED)                                     Popsicles (NO RED)                                                                  Juice: apple, WHITE grape, WHITE cranberry Sports drinks like Gatorade or Powerade (NO RED)               FOLLOW  ANY ADDITIONAL PRE OP INSTRUCTIONS YOU RECEIVED FROM YOUR SURGEON'S OFFICE!!!   Oral Hygiene is also important to reduce your risk of infection.        Remember - BRUSH YOUR TEETH THE  MORNING OF SURGERY WITH YOUR REGULAR TOOTHPASTE  Take ONLY these medicines the morning of surgery with A SIP OF WATER: None.  You may take Tylenol if needed for pain.   You may not have any metal on your body including hair pins, jewelry, and body piercing  Do not wear make-up, lotions, powders, perfumes or deodorant  Do not wear nail polish including gel and S&S, artificial / acrylic nails, or any other type of covering on natural nails including finger and toenails. If you have artificial nails, gel coating, etc., that needs to be removed by a nail salon, Please have this removed prior to surgery. Not doing so may mean that your surgery could be cancelled or delayed if the Surgeon or anesthesia staff feels like they are unable to monitor you safely.   Do not shave 48 hours prior to surgery to avoid nicks in your skin which may contribute to postoperative infections.    Contacts, Hearing Aids, dentures or bridgework may not be worn into surgery. DENTURES WILL BE REMOVED PRIOR TO SURGERY PLEASE DO NOT APPLY "Poly grip" OR ADHESIVES!!!  Patients discharged on the day of surgery will not be allowed to drive home.  Someone NEEDS to stay with you for the first 24 hours after anesthesia.  Do not bring your home medications to the hospital. The Pharmacy will dispense medications listed on your medication list to you during your admission in the Hospital.  Special  Instructions: Bring a copy of your healthcare power of attorney and living will documents the day of surgery, if you wish to have them scanned into your [facility] Records- EPIC  Please read over the following fact sheets you were given: IF YOU HAVE QUESTIONS ABOUT YOUR PRE-OP INSTRUCTIONS, PLEASE CALL [phone]  ([place])   [facility] - Preparing for Surgery Before surgery, you can play an important role.  Because skin is not sterile, your skin needs to be as free of germs as possible.  You can reduce the number of germs on your skin by washing with CHG (chlorahexidine gluconate) soap before surgery.  CHG is an antiseptic cleaner which kills germs and bonds with the skin to continue killing germs even after washing. Please DO NOT use if you have an allergy to CHG or antibacterial soaps.  If your skin becomes reddened/irritated stop using the CHG and inform your nurse when you arrive at Short Stay. Do not shave (including legs and underarms) for at least 48 hours prior to the first CHG shower.  You may shave your face/neck.  Please follow these instructions carefully:  1.  Shower with CHG Soap the night before surgery and the  morning of surgery.  2.  If you choose to wash your hair, wash your hair first as usual with your normal  shampoo.  3.  After you shampoo, rinse your hair and body thoroughly to remove the shampoo.                             4.  Use CHG as you would any other liquid soap.  You can apply chg directly to the skin and wash.  Gently with a scrungie or clean washcloth.  5.  Apply the CHG Soap to your body ONLY FROM THE NECK DOWN.   Do not use on face/ open  Wound or open sores. Avoid contact with eyes, ears mouth and genitals (private parts).                       Wash face,  Genitals (private parts) with your normal soap.             6.  Wash thoroughly, paying special attention to the area where your  surgery  will be performed.  7.  Thoroughly  rinse your body with warm water from the neck down.  8.  DO NOT shower/wash with your normal soap after using and rinsing off the CHG Soap.            9.  Pat yourself dry with a clean towel.            10.  Wear clean pajamas.            11.  Place clean sheets on your bed the night of your first shower and do not  sleep with pets.  ON THE DAY OF SURGERY : Do not apply any lotions/deodorants the morning of surgery.  Please wear clean clothes to the hospital/surgery center.    FAILURE TO FOLLOW THESE INSTRUCTIONS MAY RESULT IN THE CANCELLATION OF YOUR SURGERY  PATIENT SIGNATURE_________________________________  NURSE SIGNATURE__________________________________  ________________________________________________________________________

## 2022-09-11 NOTE — Telephone Encounter (Signed)
Critical Illness Claim form faxed/confirmed to [place]. Pt is aware

## 2022-09-12 NOTE — Patient Instructions (Signed)
Preparing for your Surgery   Plan for surgery on September 27, 2021 with Dr. [name] at [facility] will be scheduled for right inguinal lymph node debulking with possible drain placement.    Pre-operative Testing -You will receive a phone call from presurgical testing at [facility] to arrange for a pre-operative appointment and lab work.   -Bring your insurance card, copy of an advanced directive if applicable, medication list   -At that visit, you will be asked to sign a consent for a possible blood transfusion in case a transfusion becomes necessary during surgery.  The need for a blood transfusion is rare but having consent is a necessary part of your care.      -You should not be taking blood thinners or aspirin at least ten days prior to surgery unless instructed by your surgeon.   -Do not take supplements such as fish oil (omega 3), red yeast rice, turmeric before your surgery. You want to avoid medications with aspirin in them including headache powders such as BC or Goody's), Excedrin migraine.   Day Before Surgery at [facility] will be advised you can have clear liquids up until 3 hours before your surgery.     Your role in recovery We may plan on getting you home the same day. We will see how you are doing.   Your role is to become active as soon as directed by your doctor, while still giving yourself time to heal.  Rest when you feel tired. You will be asked to do the following in order to speed your recovery:   - Cough and breathe deeply. This helps to clear and expand your lungs and can prevent pneumonia after surgery.  - [place]. Do mild physical activity. Walking or moving your legs help your circulation and body functions return to normal. Do not try to get up or walk alone the first time after surgery.   -If you develop swelling on one leg or the other, pain in the back of your leg, redness/warmth in one of your legs,  please call the office or go to the Emergency Room to have a doppler to rule out a blood clot. For shortness of breath, chest pain-seek care in the Emergency Room as soon as possible. - Actively manage your pain. Managing your pain lets you move in comfort. We will ask you to rate your pain on a scale of zero to 10. It is your responsibility to tell your doctor or nurse where and how much you hurt so your pain can be treated.   Special Considerations -If you are diabetic, you may be placed on insulin after surgery to have closer control over your blood sugars to promote healing and recovery.  This does not mean that you will be discharged on insulin.  If applicable, your oral antidiabetics will be resumed when you are tolerating a solid diet.   -Your final pathology results from surgery should be available around one week after surgery and the results will be relayed to you when available.   -FMLA forms can be faxed to [phone] and please allow 5-7 business days for completion.   Pain Management After Surgery -You have been prescribed your pain medication and bowel regimen medications before surgery so that you can have these available when you are discharged from the hospital. The pain medication is for use ONLY AFTER surgery and a new prescription will not be given.    -  Make sure that you have Tylenol and Ibuprofen IF YOU ARE ABLE TO TAKE THESE MEDICATIONS at home to use on a regular basis after surgery for pain control. We recommend alternating the medications every hour to six hours since they work differently and are processed in the body differently for pain relief.   -Review the attached handout on narcotic use and their risks and side effects.    Bowel Regimen -You have been prescribed Sennakot-S to take nightly to prevent constipation especially if you are taking the narcotic pain medication intermittently.  It is important to prevent constipation and drink adequate amounts of liquids.  You can stop taking this medication when you are not taking pain medication and you are back on your normal bowel routine.   Risks of Surgery Risks of surgery are low but include bleeding, infection, damage to surrounding structures, re-operation, blood clots, and very rarely death.     Blood Transfusion Information (For the consent to be signed before surgery)   We will be checking your blood type before surgery so in case of emergencies, we will know what type of blood you would need.                                             WHAT IS A BLOOD TRANSFUSION?   A transfusion is the replacement of blood or some of its parts. Blood is made up of multiple cells which provide different functions. Red blood cells carry oxygen and are used for blood loss replacement. White blood cells fight against infection. Platelets control bleeding. Plasma helps clot blood. Other blood products are available for specialized needs, such as hemophilia or other clotting disorders. BEFORE THE TRANSFUSION  Who gives blood for transfusions?  You may be able to donate blood to be used at a later date on yourself (autologous donation). Relatives can be asked to donate blood. This is generally not any safer than if you have received blood from a stranger. The same precautions are taken to ensure safety when a relative's blood is donated. Healthy volunteers who are fully evaluated to make sure their blood is safe. This is blood bank blood. Transfusion therapy is the safest it has ever been in the practice of medicine. Before blood is taken from a donor, a complete history is taken to make sure that person has no history of diseases nor engages in risky social behavior (examples are intravenous drug use or sexual activity with multiple partners). The donor's travel history is screened to minimize risk of transmitting infections, such as malaria. The donated blood is tested for signs of infectious diseases, such as HIV and  hepatitis. The blood is then tested to be sure it is compatible with you in order to minimize the chance of a transfusion reaction. If you or a relative donates blood, this is often done in anticipation of surgery and is not appropriate for emergency situations. It takes many days to process the donated blood. RISKS AND COMPLICATIONS Although transfusion therapy is very safe and saves many lives, the main dangers of transfusion include:  Getting an infectious disease. Developing a transfusion reaction. This is an allergic reaction to something in the blood you were given. Every precaution is taken to prevent this. The decision to have a blood transfusion has been considered carefully by your caregiver before blood is given. Blood is not given  unless the benefits outweigh the risks.   AFTER SURGERY INSTRUCTIONS   Return to work: 4-6 weeks if applicable   Activity: 1. Be up and out of the bed during the day.  Take a nap if needed.  You may walk up steps but be careful and use the hand rail.  Stair climbing will tire you more than you think, you may need to stop part way and rest.    2. No lifting or straining for 6 weeks over 10 pounds. No pushing, pulling, straining for 6 weeks.   3. No driving for around 1 week(s).  Do not drive if you are taking narcotic pain medicine and make sure that your reaction time has returned.    4. You can shower as soon as the next day after surgery. Shower daily.  Use your regular soap and water (not directly on the incision) and pat your incision(s) dry afterwards; don't rub.  No tub baths or submerging your body in water until cleared by your surgeon. If you have the soap that was given to you by pre-surgical testing that was used before surgery, you do not need to use it afterwards because this can irritate your incisions.    5. No sexual activity and nothing in the vagina for 4 weeks.   6. You may experience a small amount of clear drainage from your incision,  which is normal.  If the drainage persists, increases, or changes color please call the office.   7. Do not use creams, lotions, or ointments such as neosporin on your incisions after surgery until advised by your surgeon because they can cause removal of the dermabond glue on your incisions.     8. Take Tylenol or ibuprofen first for pain if you are able to take these medications and only use narcotic pain medication for severe pain not relieved by the Tylenol or Ibuprofen.  Monitor your Tylenol intake to a max of 4,000 mg in a 24 hour period. You can alternate these medications after surgery.   Diet: 1. Low sodium Heart Healthy Diet is recommended but you are cleared to resume your normal (before surgery) diet after your procedure.   2. It is safe to use a laxative, such as Miralax or Colace, if you have difficulty moving your bowels. You have been prescribed Sennakot-S to take at bedtime every evening after surgery to keep bowel movements regular and to prevent constipation.     Wound Care: 1. Keep clean and dry.  Shower daily.   Reasons to call the Doctor: Fever - Oral temperature greater than 100.4 degrees Fahrenheit Foul-smelling vaginal discharge Difficulty urinating Nausea and vomiting Increased pain at the site of the incision that is unrelieved with pain medicine. Difficulty breathing with or without chest pain New calf pain especially if only on one side Sudden, continuing increased vaginal bleeding with or without clots.   Contacts: For questions or concerns you should contact:   Dr. [name] at [phone]   Joylene John, NP at [phone]   After Hours: call [phone] and have the GYN Oncologist paged/contacted (after 5 pm or on the weekends).   Messages sent via mychart are for non-urgent matters and are not responded to after hours so for urgent needs, please call the after hours number.

## 2022-09-12 NOTE — Progress Notes (Signed)
Patient here for follow up with Dr. Berline Lopes and for a pre-operative appointment prior to her scheduled surgery on September 27, 2022. She is scheduled for right inguinal lymph node debulking with possible drain placement. The surgery was discussed in detail.  See after visit summary for additional details. Visual aids used to discuss items related to surgery including demonstration of JP care.      Discussed post-op pain management in detail including the aspects of the enhanced recovery pathway.  Advised her that a new prescription would be sent in for tramadol and it is only to be used for after her upcoming surgery.  We discussed the use of tylenol post-op and to monitor for a maximum of 4,000 mg in a 24 hour period.  Also prescribed sennakot to be used after surgery and to hold if having loose stools.  Discussed bowel regimen in detail.     Discussed measures to take at home to prevent DVT including frequent mobility.  Reportable signs and symptoms of DVT discussed. Post-operative instructions discussed and expectations for after surgery. Incisional care discussed as well including reportable signs and symptoms including erythema, drainage, wound separation.     10 minutes spent with the patient and preparing information.  Verbalizing understanding of material discussed. No needs or concerns voiced at the end of the visit.   Advised patient to call for any needs.  Advised that her post-operative medications had been prescribed and could be picked up at any time.    This appointment is included in the global surgical bundle as pre-operative teaching and has no charge.

## 2022-09-13 ENCOUNTER — Encounter (HOSPITAL_COMMUNITY): Payer: Self-pay

## 2022-09-13 ENCOUNTER — Other Ambulatory Visit: Payer: Self-pay

## 2022-09-13 ENCOUNTER — Encounter (HOSPITAL_COMMUNITY)
Admission: RE | Admit: 2022-09-13 | Discharge: 2022-09-13 | Disposition: A | Discharge status: Home / Self Care | Payer: No Typology Code available for payment source | Source: Ambulatory Visit | Attending: Gynecologic Oncology | Admitting: Gynecologic Oncology

## 2022-09-13 DIAGNOSIS — Z01812 Encounter for preprocedural laboratory examination: Secondary | ICD-10-CM | POA: Diagnosis present

## 2022-09-13 DIAGNOSIS — C519 Malignant neoplasm of vulva, unspecified: Secondary | ICD-10-CM | POA: Diagnosis not present

## 2022-09-13 DIAGNOSIS — R591 Generalized enlarged lymph nodes: Secondary | ICD-10-CM | POA: Diagnosis not present

## 2022-09-13 HISTORY — DX: Malignant (primary) neoplasm, unspecified: C80.1

## 2022-09-13 HISTORY — DX: Other complications of anesthesia, initial encounter: T88.59XA

## 2022-09-13 HISTORY — DX: Unspecified osteoarthritis, unspecified site: M19.90

## 2022-09-13 HISTORY — DX: Personal history of urinary calculi: Z87.442

## 2022-09-13 LAB — CBC
HCT: 38.7 % (ref 36.0–46.0)
Hemoglobin: 12.4 g/dL (ref 12.0–15.0)
MCH: 31 pg (ref 26.0–34.0)
MCHC: 32 g/dL (ref 30.0–36.0)
MCV: 96.8 fL (ref 80.0–100.0)
Platelets: 272 10*3/uL (ref 150–400)
RBC: 4 MIL/uL (ref 3.87–5.11)
RDW: 13.1 % (ref 11.5–15.5)
WBC: 5.4 10*3/uL (ref 4.0–10.5)
nRBC: 0 % (ref 0.0–0.2)

## 2022-09-13 LAB — COMPREHENSIVE METABOLIC PANEL
ALT: 15 U/L (ref 0–44)
AST: 13 U/L — ABNORMAL LOW (ref 15–41)
Albumin: 4 g/dL (ref 3.5–5.0)
Alkaline Phosphatase: 41 U/L (ref 38–126)
Anion gap: 6 (ref 5–15)
BUN: 18 mg/dL (ref 8–23)
CO2: 26 mmol/L (ref 22–32)
Calcium: 8.8 mg/dL — ABNORMAL LOW (ref 8.9–10.3)
Chloride: 109 mmol/L (ref 98–111)
Creatinine, Ser: 0.55 mg/dL (ref 0.44–1.00)
GFR, Estimated: >60 mL/min (ref >60)
Glucose, Bld: 95 mg/dL (ref 70–99)
Potassium: 3.5 mmol/L (ref 3.5–5.1)
Sodium: 141 mmol/L (ref 135–145)
Total Bilirubin: 1 mg/dL (ref 0.3–1.2)
Total Protein: 6.8 g/dL (ref 6.5–8.1)

## 2022-09-13 LAB — TYPE AND SCREEN
ABO/RH(D): O NEG
Antibody Screen: NEGATIVE

## 2022-09-15 ENCOUNTER — Telehealth: Payer: Self-pay

## 2022-09-15 NOTE — Telephone Encounter (Signed)
Pt is aware Disability forms  from [employer]  are ready for pick up.She states she will pick up on Tuesday 12/26.

## 2022-09-19 ENCOUNTER — Telehealth: Payer: Self-pay

## 2022-09-19 NOTE — Telephone Encounter (Signed)
Pt called office stating she had a Pet scan on 09/01/22, ordered by Dr.Tucker. She received a denial of authorization letter from her insurance.   She was not aware of the scan not being paid for. Did not receive a call from anyone. No one mentioned it the day of the scan.   Pt came by [facility] and brought the letter she received from her insurance. I will forward to the appropriate department.

## 2022-09-23 ENCOUNTER — Other Ambulatory Visit: Payer: Self-pay | Admitting: Family Medicine

## 2022-09-23 DIAGNOSIS — M353 Polymyalgia rheumatica: Secondary | ICD-10-CM

## 2022-09-26 ENCOUNTER — Telehealth: Payer: Self-pay

## 2022-09-26 NOTE — Telephone Encounter (Signed)
Telephone call to check on pre-operative status.  Patient compliant with pre-operative instructions.  Reinforced nothing to eat after midnight. Clear liquids until 0830. Patient to arrive at 0915.  No questions or concerns voiced.  Instructed to call for any needs. Pt has asked if someone could verbally mention,in front of her husband,she is not to walk her 40lb dog for 2 weeks.

## 2022-09-26 NOTE — Discharge Instructions (Signed)
You will be unable to walk your 40lb dog for minimum of 2 weeks after surgery.  You should drive and resume normal activities once you feel fully in control again. Most patients do recover well from the procedure and resume all regular activities within 4-6 weeks.

## 2022-09-27 ENCOUNTER — Encounter (HOSPITAL_COMMUNITY): Payer: Self-pay | Admitting: Gynecologic Oncology

## 2022-09-27 ENCOUNTER — Telehealth: Payer: No Typology Code available for payment source | Admitting: Gynecologic Oncology

## 2022-09-27 ENCOUNTER — Ambulatory Visit: Payer: No Typology Code available for payment source | Admitting: Gynecologic Oncology

## 2022-09-27 ENCOUNTER — Ambulatory Visit (HOSPITAL_COMMUNITY)
Admission: RE | Admit: 2022-09-27 | Discharge: 2022-09-27 | Disposition: A | Discharge status: Home / Self Care | Payer: Medicare HMO | Attending: Gynecologic Oncology | Admitting: Gynecologic Oncology

## 2022-09-27 ENCOUNTER — Other Ambulatory Visit: Payer: Self-pay

## 2022-09-27 ENCOUNTER — Ambulatory Visit (HOSPITAL_COMMUNITY): Payer: Medicare HMO | Admitting: Anesthesiology

## 2022-09-27 ENCOUNTER — Ambulatory Visit (HOSPITAL_BASED_OUTPATIENT_CLINIC_OR_DEPARTMENT_OTHER): Payer: Medicare HMO | Admitting: Anesthesiology

## 2022-09-27 ENCOUNTER — Encounter (HOSPITAL_COMMUNITY)
Admission: RE | Disposition: A | Discharge status: Home / Self Care | Payer: Self-pay | Source: Home / Self Care | Attending: Gynecologic Oncology

## 2022-09-27 DIAGNOSIS — C774 Secondary and unspecified malignant neoplasm of inguinal and lower limb lymph nodes: Secondary | ICD-10-CM | POA: Diagnosis not present

## 2022-09-27 DIAGNOSIS — E669 Obesity, unspecified: Secondary | ICD-10-CM | POA: Insufficient documentation

## 2022-09-27 DIAGNOSIS — C519 Malignant neoplasm of vulva, unspecified: Secondary | ICD-10-CM

## 2022-09-27 DIAGNOSIS — Z79899 Other long term (current) drug therapy: Secondary | ICD-10-CM | POA: Diagnosis not present

## 2022-09-27 DIAGNOSIS — K449 Diaphragmatic hernia without obstruction or gangrene: Secondary | ICD-10-CM | POA: Insufficient documentation

## 2022-09-27 DIAGNOSIS — I10 Essential (primary) hypertension: Secondary | ICD-10-CM | POA: Diagnosis not present

## 2022-09-27 DIAGNOSIS — K5730 Diverticulosis of large intestine without perforation or abscess without bleeding: Secondary | ICD-10-CM | POA: Insufficient documentation

## 2022-09-27 DIAGNOSIS — M353 Polymyalgia rheumatica: Secondary | ICD-10-CM | POA: Insufficient documentation

## 2022-09-27 DIAGNOSIS — R591 Generalized enlarged lymph nodes: Secondary | ICD-10-CM

## 2022-09-27 DIAGNOSIS — I700 Atherosclerosis of aorta: Secondary | ICD-10-CM | POA: Insufficient documentation

## 2022-09-27 DIAGNOSIS — Z6834 Body mass index (BMI) 34.0-34.9, adult: Secondary | ICD-10-CM | POA: Insufficient documentation

## 2022-09-27 HISTORY — PX: INGUINAL LYMPHADENECTOMY: SHX6587

## 2022-09-27 LAB — ABO/RH: ABO/RH(D): O NEG

## 2022-09-27 SURGERY — LYMPHADENECTOMY, INGUINAL, OPEN
Anesthesia: General | Laterality: Right

## 2022-09-27 MED ORDER — PHENYLEPHRINE HCL (PRESSORS) 10 MG/ML IV SOLN
INTRAVENOUS | Status: DC | PRN
  Administered 2022-09-27: 100 ug via INTRAVENOUS
  Administered 2022-09-27 (×3): 80 ug via INTRAVENOUS
  Administered 2022-09-27: 100 ug via INTRAVENOUS

## 2022-09-27 MED ORDER — DEXAMETHASONE SODIUM PHOSPHATE 4 MG/ML IJ SOLN: 4.0000 mg | INTRAMUSCULAR | Status: DC

## 2022-09-27 MED ORDER — ROCURONIUM BROMIDE 100 MG/10ML IV SOLN
INTRAVENOUS | Status: DC | PRN
  Administered 2022-09-27: 60 mg via INTRAVENOUS

## 2022-09-27 MED ORDER — DEXAMETHASONE SODIUM PHOSPHATE 10 MG/ML IJ SOLN
INTRAMUSCULAR | Status: AC
  Filled 2022-09-27: qty 1

## 2022-09-27 MED ORDER — FENTANYL CITRATE (PF) 250 MCG/5ML IJ SOLN
INTRAMUSCULAR | Status: AC
  Filled 2022-09-27: qty 5

## 2022-09-27 MED ORDER — ONDANSETRON HCL 4 MG/2ML IJ SOLN
INTRAMUSCULAR | Status: AC
  Filled 2022-09-27: qty 2

## 2022-09-27 MED ORDER — MEPERIDINE HCL 50 MG/ML IJ SOLN: 6.2500 mg | INTRAMUSCULAR | Status: DC | PRN

## 2022-09-27 MED ORDER — PROMETHAZINE HCL 25 MG/ML IJ SOLN: 6.2500 mg | INTRAMUSCULAR | Status: DC | PRN

## 2022-09-27 MED ORDER — PROPOFOL 10 MG/ML IV BOLUS
INTRAVENOUS | Status: AC
  Filled 2022-09-27: qty 20

## 2022-09-27 MED ORDER — CHLORHEXIDINE GLUCONATE 0.12 % MT SOLN: 15.0000 mL | Freq: Once | OROMUCOSAL | Status: DC

## 2022-09-27 MED ORDER — BUPIVACAINE LIPOSOME 1.3 % IJ SUSP
INTRAMUSCULAR | Status: AC
  Filled 2022-09-27: qty 20

## 2022-09-27 MED ORDER — BUPIVACAINE HCL (PF) 0.25 % IJ SOLN
INTRAMUSCULAR | Status: AC
  Filled 2022-09-27: qty 30

## 2022-09-27 MED ORDER — ACETAMINOPHEN 500 MG PO TABS
1000.0000 mg | ORAL_TABLET | ORAL | Status: AC
  Administered 2022-09-27: 1000 mg via ORAL
  Filled 2022-09-27: qty 2

## 2022-09-27 MED ORDER — ROCURONIUM BROMIDE 10 MG/ML (PF) SYRINGE
PREFILLED_SYRINGE | INTRAVENOUS | Status: AC
  Filled 2022-09-27: qty 10

## 2022-09-27 MED ORDER — MIDAZOLAM HCL 2 MG/2ML IJ SOLN
INTRAMUSCULAR | Status: AC
  Filled 2022-09-27: qty 2

## 2022-09-27 MED ORDER — ONDANSETRON HCL 4 MG/2ML IJ SOLN
INTRAMUSCULAR | Status: DC | PRN
  Administered 2022-09-27: 4 mg via INTRAVENOUS

## 2022-09-27 MED ORDER — FENTANYL CITRATE (PF) 100 MCG/2ML IJ SOLN
INTRAMUSCULAR | Status: DC | PRN
  Administered 2022-09-27: 25 ug via INTRAVENOUS
  Administered 2022-09-27: 50 ug via INTRAVENOUS
  Administered 2022-09-27: 100 ug via INTRAVENOUS
  Administered 2022-09-27: 25 ug via INTRAVENOUS

## 2022-09-27 MED ORDER — LIDOCAINE HCL (PF) 2 % IJ SOLN
INTRAMUSCULAR | Status: AC
  Filled 2022-09-27: qty 5

## 2022-09-27 MED ORDER — DEXAMETHASONE SODIUM PHOSPHATE 10 MG/ML IJ SOLN
INTRAMUSCULAR | Status: DC | PRN
  Administered 2022-09-27: 8 mg via INTRAVENOUS

## 2022-09-27 MED ORDER — BUPIVACAINE HCL 0.25 % IJ SOLN
INTRAMUSCULAR | Status: DC | PRN
  Administered 2022-09-27: 10 mL

## 2022-09-27 MED ORDER — MIDAZOLAM HCL 2 MG/2ML IJ SOLN: 0.5000 mg | Freq: Once | INTRAMUSCULAR | Status: DC | PRN

## 2022-09-27 MED ORDER — HYDROMORPHONE HCL 1 MG/ML IJ SOLN: 0.2500 mg | INTRAMUSCULAR | Status: DC | PRN

## 2022-09-27 MED ORDER — PROPOFOL 10 MG/ML IV BOLUS
INTRAVENOUS | Status: DC | PRN
  Administered 2022-09-27: 130 mg via INTRAVENOUS

## 2022-09-27 MED ORDER — HEPARIN SODIUM (PORCINE) 5000 UNIT/ML IJ SOLN
5000.0000 [IU] | INTRAMUSCULAR | Status: AC
  Administered 2022-09-27: 5000 [IU] via SUBCUTANEOUS
  Filled 2022-09-27: qty 1

## 2022-09-27 MED ORDER — LACTATED RINGERS IV SOLN: INTRAVENOUS | Status: DC

## 2022-09-27 MED ORDER — LIDOCAINE HCL (CARDIAC) PF 100 MG/5ML IV SOSY
PREFILLED_SYRINGE | INTRAVENOUS | Status: DC | PRN
  Administered 2022-09-27: 40 mg via INTRAVENOUS

## 2022-09-27 MED ORDER — PHENYLEPHRINE 80 MCG/ML (10ML) SYRINGE FOR IV PUSH (FOR BLOOD PRESSURE SUPPORT)
PREFILLED_SYRINGE | INTRAVENOUS | Status: AC
  Filled 2022-09-27: qty 10

## 2022-09-27 MED ORDER — CEFAZOLIN SODIUM-DEXTROSE 2-4 GM/100ML-% IV SOLN
2.0000 g | INTRAVENOUS | Status: AC
  Administered 2022-09-27: 2 g via INTRAVENOUS
  Filled 2022-09-27: qty 100

## 2022-09-27 MED ORDER — MIDAZOLAM HCL 5 MG/5ML IJ SOLN
INTRAMUSCULAR | Status: DC | PRN
  Administered 2022-09-27 (×2): 1 mg via INTRAVENOUS

## 2022-09-27 MED ORDER — POVIDONE-IODINE 10 % EX SWAB: 2.0000 | Freq: Once | CUTANEOUS | Status: DC

## 2022-09-27 MED ORDER — SUGAMMADEX SODIUM 200 MG/2ML IV SOLN
INTRAVENOUS | Status: DC | PRN
  Administered 2022-09-27: 200 mg via INTRAVENOUS

## 2022-09-27 MED ORDER — OXYCODONE HCL 5 MG/5ML PO SOLN: 5.0000 mg | Freq: Once | ORAL | Status: DC | PRN

## 2022-09-27 MED ORDER — OXYCODONE HCL 5 MG PO TABS: 5.0000 mg | ORAL_TABLET | Freq: Once | ORAL | Status: DC | PRN

## 2022-09-27 MED ORDER — BUPIVACAINE LIPOSOME 1.3 % IJ SUSP
INTRAMUSCULAR | Status: DC | PRN
  Administered 2022-09-27: 10 mL

## 2022-09-27 MED ORDER — ORAL CARE MOUTH RINSE: 15.0000 mL | Freq: Once | OROMUCOSAL | Status: DC

## 2022-09-27 SURGICAL SUPPLY — 49 items
ADH SKN CLS APL DERMABOND .7 (GAUZE/BANDAGES/DRESSINGS) ×2
APL PRP STRL LF DISP 70% ISPRP (MISCELLANEOUS) ×1
BAG COUNTER SPONGE SURGICOUNT (BAG) IMPLANT
BAG SPNG CNTER NS LX DISP (BAG)
BNDG GAUZE DERMACEA FLUFF 4 (GAUZE/BANDAGES/DRESSINGS) ×1 IMPLANT
BNDG GZE DERMACEA 4 6PLY (GAUZE/BANDAGES/DRESSINGS)
CHLORAPREP W/TINT 26 (MISCELLANEOUS) ×1 IMPLANT
CLIP TI LARGE 6 (CLIP) IMPLANT
CLIP TI MEDIUM 6 (CLIP) ×1 IMPLANT
CLIP TI MEDIUM LARGE 6 (CLIP) IMPLANT
COVER SURGICAL LIGHT HANDLE (MISCELLANEOUS) ×1 IMPLANT
DERMABOND ADVANCED .7 DNX12 (GAUZE/BANDAGES/DRESSINGS) ×1 IMPLANT
DRAIN CHANNEL 10F 3/8 F FF (DRAIN) ×2 IMPLANT
DRAPE SHEET LG 3/4 BI-LAMINATE (DRAPES) ×3 IMPLANT
DRAPE UTILITY XL STRL (DRAPES) ×1 IMPLANT
DRSG TEGADERM 4X4.75 (GAUZE/BANDAGES/DRESSINGS) ×4 IMPLANT
ELECT REM PT RETURN 15FT ADLT (MISCELLANEOUS) ×1 IMPLANT
EVACUATOR SILICONE 100CC (DRAIN) ×2 IMPLANT
GLOVE BIO SURGEON STRL SZ 6 (GLOVE) ×1 IMPLANT
GLOVE BIO SURGEON STRL SZ 6.5 (GLOVE) ×2 IMPLANT
GOWN STRL REUS W/ TWL LRG LVL3 (GOWN DISPOSABLE) ×2 IMPLANT
GOWN STRL REUS W/TWL LRG LVL3 (GOWN DISPOSABLE) ×2
KIT BASIN OR (CUSTOM PROCEDURE TRAY) ×1 IMPLANT
KIT TURNOVER KIT A (KITS) IMPLANT
LEGGING LITHOTOMY PAIR STRL (DRAPES) ×1 IMPLANT
NEEDLE HYPO 22GX1.5 SAFETY (NEEDLE) ×1 IMPLANT
PACK GENERAL/GYN (CUSTOM PROCEDURE TRAY) ×1 IMPLANT
SCRUB CHG 4% DYNA-HEX 4OZ (MISCELLANEOUS) ×1 IMPLANT
SHEARS HARMONIC 9CM CVD (BLADE) ×1 IMPLANT
SPONGE DRAIN TRACH 4X4 STRL 2S (GAUZE/BANDAGES/DRESSINGS) ×2 IMPLANT
SUT ETHILON 3 0 PS 1 (SUTURE) ×2 IMPLANT
SUT MNCRL AB 4-0 PS2 18 (SUTURE) ×2 IMPLANT
SUT NYLON 3 0 (SUTURE) ×2 IMPLANT
SUT SILK 2 0 (SUTURE)
SUT SILK 2-0 18XBRD TIE 12 (SUTURE) ×1 IMPLANT
SUT SILK 3 0 (SUTURE)
SUT SILK 3-0 18XBRD TIE 12 (SUTURE) IMPLANT
SUT VIC AB 2-0 SH 27 (SUTURE) ×1
SUT VIC AB 2-0 SH 27X BRD (SUTURE) IMPLANT
SUT VIC AB 3-0 CT1 36 (SUTURE) IMPLANT
SUT VIC AB 3-0 SH 18 (SUTURE) IMPLANT
SUT VIC AB 3-0 SH 27 (SUTURE)
SUT VIC AB 3-0 SH 27X BRD (SUTURE) IMPLANT
SUT VIC AB 4-0 PS2 27 (SUTURE) IMPLANT
SYR BULB IRRIG 60ML STRL (SYRINGE) ×1 IMPLANT
SYR CONTROL 10ML LL (SYRINGE) ×1 IMPLANT
TOWEL OR 17X26 10 PK STRL BLUE (TOWEL DISPOSABLE) ×1 IMPLANT
TOWEL OR NON WOVEN STRL DISP B (DISPOSABLE) ×1 IMPLANT
TRAY FOLEY MTR SLVR 16FR STAT (SET/KITS/TRAYS/PACK) IMPLANT

## 2022-09-27 NOTE — Op Note (Signed)
OPERATIVE NOTE  PATIENT: Luna Glasgow DATE: 09/27/22  Preop Diagnosis: Recurrent melanoma  Postoperative Diagnosis: same as above  Surgery: Excision of enlarged right inguinal lymph node  Surgeons:  Valarie Cones MD  Assistant: Lahoma Crocker MD  Anesthesia: General   Estimated blood loss: 50 ml  IVF:  see I&O flowsheet   Urine output: n/a   Complications: None apparent  Pathology: enlarged right inguinal lymph node  Operative findings: 2 cm palpable but mobile right inguinal lymph node, hyperpigmented. No other palpable adenopathy.  Procedure: The patient was identified in the preoperative holding area. Informed consent was signed on the chart. Patient was seen history was reviewed and exam was performed.   The patient was then taken to the operating room and placed in the supine position with SCD hose on. General anesthesia was then induced without difficulty. She was then placed in the dorsolithotomy position. The right groin was clipped and the abdomen and groin were prepped with Chloraprep. The vagina was prepped with Betadine. The patient was then draped after the prep was dried.   Timeout was performed the patient, procedure, antibiotic, allergy, and length of procedure.   The landmarks of the of the pubic tubercle and the ASIS were identified and the palpable location of the femoral artery. A 6 cm incision was made directly over the palpably enlarged lymph node within the right anterior thigh/groin. The camper's fascia was scored and transected using monopolar electrocautery. The enlarged and hyperpigmented lymph node was palpated medial to the femoral vein and superficial to the cribiform fascia. The great saphenous vein was identified and spared during the dissection to remove the enlarged lymph node. A lateral perforating vessel was sacrificed, clipped and transected. The lymph node was slowly dissected free from surrounding tissue until only inferior  attachment remained. This lymphatic was clamped, transected, and suture ligated using 2-0 Vicryl. The lymph node was handed off the field. The surgical bed was copiously irrigated. The femoral vein and artery palpated. Hemostasis was assured. The camper's fascia was closed with running 2-0 vicryl and a second layer of running 3-0 Vicryl was used to close the subcutaneous space. The superficial subcutaneous tissue was closed with running 4-0 vicryl and the skin closed with Monocryl in subcuticular fashion. Dermabond was then applied.  All instrument, suture, laparotomy, Ray-Tec, and needle counts were correct x2. The patient tolerated the procedure well and was taken recovery room in stable condition.   Lafonda Mosses, MD

## 2022-09-27 NOTE — Anesthesia Procedure Notes (Signed)
Procedure Name: Intubation Date/Time: 09/27/2022 11:26 AM  Performed by: Garrel Ridgel, CRNAPre-anesthesia Checklist: Patient identified, Emergency Drugs available, Suction available and Patient being monitored Patient Re-evaluated:Patient Re-evaluated prior to induction Oxygen Delivery Method: Circle system utilized Preoxygenation: Pre-oxygenation with 100% oxygen Induction Type: IV induction Ventilation: Mask ventilation without difficulty Laryngoscope Size: Glidescope Grade View: Grade II Tube type: Oral Tube size: 7.0 mm Number of attempts: 1 Airway Equipment and Method: Stylet and Oral airway Placement Confirmation: ETT inserted through vocal cords under direct vision, positive ETCO2 and breath sounds checked- equal and bilateral Secured at: 22 cm Tube secured with: Tape Dental Injury: Teeth and Oropharynx as per pre-operative assessment  Difficulty Due To: Difficulty was anticipated

## 2022-09-27 NOTE — Anesthesia Preprocedure Evaluation (Addendum)
Anesthesia Evaluation  Patient identified by MRN, date of birth, ID band Patient awake    Reviewed: Allergy & Precautions, NPO status , Patient's Chart, lab work & pertinent test results  History of Anesthesia Complications Negative for: history of anesthetic complications  Airway Mallampati: II  TM Distance: >3 FB Neck ROM: Full    Dental  (+) Caps, Dental Advisory Given   Pulmonary neg pulmonary ROS   breath sounds clear to auscultation       Cardiovascular hypertension, Pt. on medications (-) angina  Rhythm:Regular Rate:Normal     Neuro/Psych negative neurological ROS     GI/Hepatic negative GI ROS, Neg liver ROS,,,  Endo/Other  negative endocrine ROS  BMI 33.8  Renal/GU negative Renal ROS     Musculoskeletal  (+) Arthritis ,  Polymyalgia rheumatica   Abdominal  (+) + obese  Peds  Hematology negative hematology ROS (+)   Anesthesia Other Findings   Reproductive/Obstetrics                             Anesthesia Physical Anesthesia Plan  ASA: 2  Anesthesia Plan: General   Post-op Pain Management: Tylenol PO (pre-op)*   Induction: Intravenous  PONV Risk Score and Plan: 3 and Ondansetron, Dexamethasone and Scopolamine patch - Pre-op  Airway Management Planned: Oral ETT  Additional Equipment: None  Intra-op Plan:   Post-operative Plan: Extubation in OR  Informed Consent: I have reviewed the patients History and Physical, chart, labs and discussed the procedure including the risks, benefits and alternatives for the proposed anesthesia with the patient or authorized representative who has indicated his/her understanding and acceptance.     Dental advisory given  Plan Discussed with: CRNA and Surgeon  Anesthesia Plan Comments:         Anesthesia Quick Evaluation

## 2022-09-27 NOTE — Brief Op Note (Signed)
09/27/2022  12:57 PM  PATIENT:  Ariel Rios  67 y.o. female  PRE-OPERATIVE DIAGNOSIS:  VULVAR MELANOMA LYMPHADENOPATHY  POST-OPERATIVE DIAGNOSIS:  VULVAR MELANOMALYMPHADENOPATHY  PROCEDURE:  Procedure(s): RIGHT INGUINAL LYMPH NODE DEBULKING (Right)  SURGEON:  Surgeon(s) and Role:    * Lafonda Mosses, MD - Primary    * Lahoma Crocker, MD - Assisting  ANESTHESIA:   general  EBL:  50 mL   BLOOD ADMINISTERED:none  DRAINS: none   LOCAL MEDICATIONS USED:  Exparel mixed with 0.25% marcaine  SPECIMEN:  right enlarged inguinal lymph node  DISPOSITION OF SPECIMEN:  PATHOLOGY  COUNTS:  YES  TOURNIQUET:  * No tourniquets in log *  DICTATION: .Note written in EPIC  PLAN OF CARE: Discharge to home after PACU  PATIENT DISPOSITION:  PACU - hemodynamically stable.   Delay start of Pharmacological VTE agent (>24hrs) due to surgical blood loss or risk of bleeding: not applicable

## 2022-09-27 NOTE — Interval H&P Note (Signed)
History and Physical Interval Note:  09/27/2022 10:36 AM  Ariel Rios  has presented today for surgery, with the diagnosis of [place].  The various methods of treatment have been discussed with the patient and family. After consideration of risks, benefits and other options for treatment, the patient has consented to  Procedure(s): RIGHT INGUINAL LYMPHNODE DEBULKING; PLACEMENT OF [place] (Right) as a surgical intervention.  The patient's history has been reviewed, patient examined, no change in status, stable for surgery.  I have reviewed the patient's chart and labs.  Questions were answered to the patient's satisfaction.     Lafonda Mosses

## 2022-09-27 NOTE — Anesthesia Postprocedure Evaluation (Signed)
Anesthesia Post Note  Patient: Ariel Rios  Procedure(s) Performed: RIGHT INGUINAL LYMPH NODE DEBULKING (Right)     Patient location during evaluation: PACU Anesthesia Type: General Level of consciousness: awake and alert, patient cooperative and oriented Pain management: pain level controlled Vital Signs Assessment: post-procedure vital signs reviewed and stable Respiratory status: spontaneous breathing, nonlabored ventilation and respiratory function stable Cardiovascular status: blood pressure returned to baseline and stable Postop Assessment: no apparent nausea or vomiting and able to ambulate Anesthetic complications: yes   Encounter Notable Events  Notable Event Outcome Phase Comment  Difficult to intubate - expected  Intraprocedure Filed from anesthesia note documentation.    Last Vitals:  Vitals:   09/27/22 1300 09/27/22 1315  BP: 124/68 117/67  Pulse: 68 64  Resp: 16 14  Temp:  (!) 36.4 C  SpO2: 90% 93%    Last Pain:  Vitals:   09/27/22 1315  TempSrc:   PainSc: 0-No pain                 Shernell Saldierna,E. Rahm Minix

## 2022-09-27 NOTE — Transfer of Care (Signed)
Immediate Anesthesia Transfer of Care Note  Patient: Ariel Rios  Procedure(s) Performed: RIGHT INGUINAL LYMPH NODE DEBULKING (Right)  Patient Location: PACU  Anesthesia Type:General  Level of Consciousness: awake, alert , oriented, and patient cooperative  Airway & Oxygen Therapy: Patient Spontanous Breathing and Patient connected to face mask oxygen  Post-op Assessment: Report given to RN and Post -op Vital signs reviewed and stable  Post vital signs: Reviewed and stable  Last Vitals:  Vitals Value Taken Time  BP 121/71 09/27/22 1234  Temp    Pulse 68 09/27/22 1237  Resp 13 09/27/22 1237  SpO2 100 % 09/27/22 1237  Vitals shown include unvalidated device data.  Last Pain:  Vitals:   09/27/22 0918  TempSrc:   PainSc: 0-No pain         Complications:  Encounter Notable Events  Notable Event Outcome Phase Comment  Difficult to intubate - expected  Intraprocedure Filed from anesthesia note documentation.

## 2022-09-28 ENCOUNTER — Encounter (HOSPITAL_COMMUNITY): Payer: Self-pay | Admitting: Gynecologic Oncology

## 2022-09-28 ENCOUNTER — Telehealth: Payer: Self-pay | Admitting: Surgery

## 2022-09-28 NOTE — Telephone Encounter (Signed)
Spoke with Ariel Rios this morning. She states she is eating, drinking and urinating well. She has not had a BM yet but is passing gas. She is taking senokot as prescribed and encouraged her to drink plenty of water. She denies fever or chills. Incisions are dry and intact. She rates her pain 0/10. Her pain is controlled with Ibuprofen and Tylenol.    Instructed to call office with any fever, chills, purulent drainage, uncontrolled pain or any other questions or concerns. Patient verbalizes understanding.   Pt aware of post op appointments as well as the office number [phone] and after hours number [phone] to call if she has any questions or concerns

## 2022-09-29 ENCOUNTER — Encounter: Payer: Self-pay | Admitting: Family Medicine

## 2022-10-04 ENCOUNTER — Encounter: Payer: Self-pay | Admitting: Gynecologic Oncology

## 2022-10-04 ENCOUNTER — Inpatient Hospital Stay: Payer: Medicare HMO | Admitting: Gynecologic Oncology

## 2022-10-04 ENCOUNTER — Inpatient Hospital Stay: Payer: Medicare HMO | Attending: Gynecologic Oncology | Admitting: Gynecologic Oncology

## 2022-10-04 DIAGNOSIS — Z9889 Other specified postprocedural states: Secondary | ICD-10-CM

## 2022-10-04 DIAGNOSIS — Z800 Family history of malignant neoplasm of digestive organs: Secondary | ICD-10-CM | POA: Insufficient documentation

## 2022-10-04 DIAGNOSIS — R599 Enlarged lymph nodes, unspecified: Secondary | ICD-10-CM

## 2022-10-04 DIAGNOSIS — Z8051 Family history of malignant neoplasm of kidney: Secondary | ICD-10-CM | POA: Insufficient documentation

## 2022-10-04 DIAGNOSIS — C519 Malignant neoplasm of vulva, unspecified: Secondary | ICD-10-CM

## 2022-10-04 DIAGNOSIS — Z7189 Other specified counseling: Secondary | ICD-10-CM

## 2022-10-04 DIAGNOSIS — C774 Secondary and unspecified malignant neoplasm of inguinal and lower limb lymph nodes: Secondary | ICD-10-CM

## 2022-10-04 DIAGNOSIS — Z808 Family history of malignant neoplasm of other organs or systems: Secondary | ICD-10-CM | POA: Insufficient documentation

## 2022-10-04 LAB — SURGICAL PATHOLOGY

## 2022-10-04 NOTE — Progress Notes (Unsigned)
HPI: Ariel Rios is a 67 y.o. female, who is here today for follow up. Since her last visit she has followed with oncologist and surgeon. S/P right inguinal lymph node debulking, positive. Vulvar melanoma, following with oncologist and surgeon. Did not tolerate Keytruda. Following up with Dr. Berline Lopes every three months.  Lab Results  Component Value Date   CRP <1.0 12/12/2021   Lab Results  Component Value Date   ESRSEDRATE 11 12/12/2021   Lab Results  Component Value Date   HGBA1C 5.9 12/12/2021   HLD on non pharmacologic treatment. Lab Results  Component Value Date   CHOL 237 (H) 12/12/2021   HDL 87.10 12/12/2021   LDLCALC 119 (H) 12/12/2021   LDLDIRECT 150.4 10/21/2013   TRIG 156.0 (H) 12/12/2021   CHOLHDL 3 12/12/2021   PMR:She reports having been on prednisone 10 mg. She has been on Prednisone since 2021, has been weaning dose down as tolerated. According to pt, due to risk of interaction with chemo Beryle Flock), she was asked to stop Prednisone fr a few weeks and all symptoms came back: Severe shoulder pain,fatigue,and difficulty getting up from chair.  Symptoms resolved when resuming Prednisone.  She experienced a fall at the end of October/2023, resulting in a torn right rotator cuff. She saw ortho and completed physiotherapy. She chose not to have surgery, as she has a full range of motion in her right shoulder without pain.   Since her last visit she also had cataract surgery.  Hypokalemia: Currently she is on K-Lor 20 mK daily. She takes HCTZ 25 mg daily as needed for lower extremity edema. Negative for CP, dyspnea, orthopnea, PND, or a skin ulcer/erythema. Lab Results  Component Value Date   CREATININE 0.55 09/13/2022   BUN 18 09/13/2022   NA 141 09/13/2022   K 3.5 09/13/2022   CL 109 09/13/2022   CO2 26 09/13/2022   Review of Systems  Constitutional:  Negative for activity change, appetite change and fever.  HENT:  Negative for mouth sores and  nosebleeds.   Eyes:  Negative for redness and visual disturbance.  Respiratory:  Negative for cough and wheezing.   Cardiovascular:  Negative for palpitations.  Gastrointestinal:  Negative for abdominal pain, nausea and vomiting.       Negative for changes in bowel habits.  Endocrine: Negative for cold intolerance and heat intolerance.  Genitourinary:  Negative for decreased urine volume, dysuria and hematuria.  Neurological:  Negative for syncope, weakness and headaches.  Psychiatric/Behavioral:  Negative for confusion. The patient is not nervous/anxious.   See other pertinent positives and negatives in HPI.  Current Outpatient Medications on File Prior to Visit  Medication Sig Dispense Refill   acetaminophen (TYLENOL) 500 MG tablet Take 500 mg by mouth every 6 (six) hours as needed for moderate pain.     ammonium lactate (LAC-HYDRIN) 12 % lotion Apply 1 Application topically as needed.     calcium carbonate (TUMS - DOSED IN MG ELEMENTAL CALCIUM) 500 MG chewable tablet Chew 1-2 tablets by mouth daily as needed for indigestion or heartburn.     hydrochlorothiazide (HYDRODIURIL) 25 MG tablet TAKE 1 TABLET BY MOUTH DAILY AS NEEDED. (Patient taking differently: Take 25 mg by mouth daily.) 90 tablet 3   ibuprofen (ADVIL) 200 MG tablet Take 200 mg by mouth every 6 (six) hours as needed for moderate pain.     KLOR-CON M20 20 MEQ tablet TAKE 1 TABLET BY MOUTH EVERY DAY 90 tablet 1   Menthol-Methyl Salicylate (  SALONPAS PAIN RELIEF PATCH EX) Apply 1 patch topically daily as needed (pain).     Multiple Vitamins-Minerals (MULTIVITAMIN WITH MINERALS) tablet Take 1 tablet by mouth daily.     predniSONE (DELTASONE) 10 MG tablet TAKE 1 TABLET (10 MG TOTAL) BY MOUTH DAILY WITH BREAKFAST. 30 tablet 0   [DISCONTINUED] prochlorperazine (COMPAZINE) 10 MG tablet Take 1 tablet (10 mg total) by mouth every 6 (six) hours as needed for nausea or vomiting. (Patient not taking: Reported on 03/07/2021) 30 tablet 1   No  current facility-administered medications on file prior to visit.   Past Medical History:  Diagnosis Date   Anemia    Arthritis    BMI 34.0-34.9,adult    Cancer (HCC)    melanoma   Complication of anesthesia    hard to wake up after anesthesia   Family history of melanoma    History of kidney stones    Melanotic macule of vulvar labia    Migraine    Polymyalgia rheumatica (HCC)    Venous insufficiency    No Known Allergies  Social History   Socioeconomic History   Marital status: Married    Spouse name: Not on file   Number of children: 2   Years of education: Not on file   Highest education level: 12th grade  Occupational History   Occupation: Economist   Occupation: stocking  Tobacco Use   Smoking status: Never   Smokeless tobacco: Never  Vaping Use   Vaping Use: Never used  Substance and Sexual Activity   Alcohol use: Yes    Comment: occasional   Drug use: No   Sexual activity: Not Currently  Other Topics Concern   Not on file  Social History Narrative   Not on file   Social Determinants of Health   Financial Resource Strain: Not on file  Food Insecurity: No Food Insecurity (10/03/2022)   Hunger Vital Sign    Worried About Running Out of Food in the Last Year: Never true    Ran Out of Food in the Last Year: Never true  Transportation Needs: No Transportation Needs (10/03/2022)   PRAPARE - Hydrologist (Medical): No    Lack of Transportation (Non-Medical): No  Physical Activity: Insufficiently Active (10/03/2022)   Exercise Vital Sign    Days of Exercise per Week: 7 days    Minutes of Exercise per Session: 20 min  Stress: No Stress Concern Present (10/03/2022)   [place]    Feeling of Stress : Not at all  Social Connections: Socially Isolated (10/03/2022)   Social Connection and Isolation Panel [NHANES]    Frequency of Communication with Friends and Family: Never     Frequency of Social Gatherings with Friends and Family: Once a week    Attends Religious Services: Never    Marine scientist or Organizations: No    Attends Music therapist: Not on file    Marital Status: Married   Vitals:   10/06/22 0700  BP: 110/70  Pulse: 71  Resp: 16  Temp: 97.8 F (36.6 C)  SpO2: 97%   Body mass index is 33.84 kg/m.  Physical Exam Vitals and nursing note reviewed.  Constitutional:      General: She is not in acute distress.    Appearance: She is well-developed.  HENT:     Head: Normocephalic and atraumatic.     Mouth/Throat:     Mouth:  Mucous membranes are moist.  Eyes:     Conjunctiva/sclera: Conjunctivae normal.  Cardiovascular:     Rate and Rhythm: Normal rate and regular rhythm.     Pulses:          Dorsalis pedis pulses are 2+ on the right side and 2+ on the left side.     Heart sounds: No murmur heard. Pulmonary:     Effort: Pulmonary effort is normal. No respiratory distress.     Breath sounds: Normal breath sounds.  Abdominal:     Palpations: Abdomen is soft. There is no hepatomegaly or mass.     Tenderness: There is no abdominal tenderness.  Musculoskeletal:     Right shoulder: No swelling, deformity or tenderness. Normal range of motion.     Left shoulder: No swelling or tenderness. Normal range of motion.     Comments: No signs of synovitis.  Lymphadenopathy:     Cervical: No cervical adenopathy.  Skin:    General: Skin is warm.     Findings: No erythema or rash.  Neurological:     General: No focal deficit present.     Mental Status: She is alert and oriented to person, place, and time.     Cranial Nerves: No cranial nerve deficit.     Gait: Gait normal.  Psychiatric:        Mood and Affect: Mood and affect normal.   ASSESSMENT AND PLAN:  Ariel Rios was seen today for medication management.  Diagnoses and all orders for this visit: Lab Results  Component Value Date   CRP <1.0 10/06/2022   Lab  Results  Component Value Date   ESRSEDRATE 18 10/06/2022   Lab Results  Component Value Date   CHOL 253 (H) 10/06/2022   HDL 71.20 10/06/2022   LDLCALC 157 (H) 10/06/2022   LDLDIRECT 150.4 10/21/2013   TRIG 123.0 10/06/2022   CHOLHDL 4 10/06/2022   Lab Results  Component Value Date   HGBA1C 5.8 10/06/2022   Prediabetes Assessment & Plan: Hemoglobin A1c was 5.9 in 11/2021. Continue a healthy lifestyle for diabetes prevention. Further recommendation will be given according to hemoglobin A1c result.  Orders: -     Hemoglobin A1c; Future  PMR (polymyalgia rheumatica) (HCC) Assessment & Plan: She has been asymptomatic since starting prednisone, symptoms reoccurred when medication has been discontinued in the past. We discussed some side effects of medication. She will try to decrease dose from 10 mg daily to alternate between 5 mg every third day and 10 mg the rest of the days, if able to do so in 3 weeks she can go ahead and take 5 mg daily. We discussed other current options to treat problem, for now she would like to hold on rheumatology referral, she will let me know if she decides to do so. Said rate and CRP will be checked today. Given the fact she is seeing other providers a few times throughout the year and having blood work regularly, I think it is appropriate to follow annually, before if any problem.  Orders: -     Sedimentation rate; Future -     C-reactive protein; Future -     predniSONE; Take 1 tablet (10 mg total) by mouth daily with breakfast.  Dispense: 90 tablet; Refill: 3  Atherosclerosis of aorta ([place]) Assessment & Plan: She is not on statin medication. FLP checked today.  Orders: -     Lipid panel; Future  PMR (polymyalgia rheumatica) (HCC) Assessment & Plan: She has  been asymptomatic since starting prednisone, symptoms reoccurred when medication has been discontinued in the past. We discussed some side effects of medication. She will try to  decrease dose from 10 mg daily to alternate between 5 mg every third day and 10 mg the rest of the days, if able to do so in 3 weeks she can go ahead and take 5 mg daily. We discussed other current options to treat problem, for now she would like to hold on rheumatology referral, she will let me know if she decides to do so. Said rate and CRP will be checked today. Given the fact she is seeing other providers a few times throughout the year and having blood work regularly, I think it is appropriate to follow annually, before if any problem.  Orders: -     Sedimentation rate; Future -     C-reactive protein; Future -     predniSONE; Take 1 tablet (10 mg total) by mouth daily with breakfast.  Dispense: 90 tablet; Refill: 3  Hyperlipidemia, unspecified hyperlipidemia type Assessment & Plan: Non pharmacologic treatment to continue for now. Further recommendations will be given according to 10 years CVD risk score and lipid panel numbers.  Orders: -     Lipid panel; Future  Need for influenza vaccination -     Flu Vaccine QUAD High Dose(Fluad)  Need for pneumococcal 20-valent conjugate vaccination -     Pneumococcal conjugate vaccine 20-valent  Asymptomatic postmenopausal estrogen deficiency -     DG Bone Density; Future  Hypokalemia Assessment & Plan: Potassium was 3.5 in 08/2022. Continue K-Lor 20 mK daily.    Bilateral leg edema Assessment & Plan: Problem is well-controlled. In general she has tolerated thiazide well, except for mild hypokalemia. Continue HCTZ 25 mg daily as needed.   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 She did not tolerate chemotherapy. Following with oncologist and surgeon.   I spent a total of 44 minutes in both face to face and non face to face activities for this visit on the date of this encounter. During this time history was obtained and documented,  examination was performed, prior labs reviewed, and assessment/plan discussed.  Return in about 1 year (around 10/07/2023) for chronic problems, CPE.  Amariz Flamenco G. [name], MD  [facility]. [place] office.

## 2022-10-04 NOTE — Progress Notes (Signed)
Gynecologic Oncology Telehealth Note: Gyn-Onc  I connected with Cephus Richer on 10/04/22 at  4:20 PM EST by telephone and verified that I am speaking with the correct person using two identifiers.  I discussed the limitations, risks, security and privacy concerns of performing an evaluation and management service by telemedicine and the availability of in-person appointments. I also discussed with the patient that there may be a patient responsible charge related to this service. The patient expressed understanding and agreed to proceed.  Other persons participating in the visit and their role in the encounter: none.  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  She denies pain   12/07/2020 Initial Biopsy   FINAL MICROSCOPIC DIAGNOSIS:   A.   RIGHT VULVA, BIOPSY: PRIMARY MALIGNANT MELANOMA OF VULVA, MELANOMA  TABLE BELOW  MALIGNANT MELANOMA (AJCC 8TH EDITION):  PROCEDURE: BIOPSY  SPECIMEN ANATOMIC SITE: RIGHT VULVA  BRESLOW'S DEPTH/MAXIMUM TUMOR THICKNESS: 5.05 MM  CLARK/ANATOMIC LEVEL* IV  MARGINS:  PERIPHERAL: INVOLVED  DEEP: FREE  ULCERATION: PRESENT  MITOTIC INDEX: 11/mm2  LYMPHO-VASCULAR INVASION: PRESENT*  NEUTROTROPISM: ABSENT  TUMOR-INFILTRATING LYMPHOCYTES: NON-BRISK  LYMPH NODES: N/A  PATHOLOGIC STAGE: PT4B NX MX  *VULVAR MELANOMAS MAY NOT HAVE THE SAME BEHAVIOR STAGE FOR STAGE AS SUPERFICIAL SPREADING CUTANEOUS MELANOMA, AND LANDMARKS SUCH AS BRESLOW'S DEPTH MAY NOT APPLY. NONETHELESS, THIS SHOULD BE TREATED AS A PT4B MELANOMA AND COMPLETE EXCISION, WITH POSSIBLE LYMPH NODE EVALUATION, UNDERTAKEN. SEE COMMENT FOR IHC PROFILE.    COMMENT:  Sections show confluent and nested growth of atypical melanocytes along vulvar  epithelium in an in-situ melanoma growth pattern at the periphery, with a central large ulcerated nodule of similar atypical  melanocytes, consonant with primary vulvar melanoma. Notably, IHC was performed to evaluate the lineage and is diagnostic for melanoma. Lesional melanocytes are positive with S100, MelanA, and show a high ki-67 uptake, and are negative with CDX-2, synaptophysin, CK20, TTF-1, CK7, CD56, and lymphoid markers CD5, CD3, CD20 highlight only background lymphocytes.    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 The only potential hypermetabolic finding of note is a small flat focus of accentuated metabolic activity along the anterior vaginal vestibule/perineum, maximum SUV 6.5. Most common cause for this type of appearance would be a trace amount of incontinence of urinary FDG, but given the proximity to the reported vulvar melanoma, this small focus is considered indeterminate. 2. Other imaging findings of potential clinical significance: Aortic Atherosclerosis (ICD10-I70.0). Small type 1 hiatal hernia. Palatine tonsilloliths. Colonic diverticulosis.   01/18/2021 Surgery   Preoperative Diagnosis: Vulvar melanoma  Postoperative Diagnosis: Same  Procedures performed: Wide radical vulvectomy, R inguinofemoral sentinel LN biopsy  Attending Surgeon: [name], MD  Fellow: [name]  Resident: Melony Overly, MD; Darrin Nipper, MD  Anesthesia: General endotracheal  Findings:  1. Vulva with biopsy site changes at R labia minora adjacent to the clitoris. No visible tumor. 2. Inguinofemoral nodes not clinically enlarged or abnormal.   Specimens:  1. R inguinal sentinel LN, hot and blue  2. Portion of  vulva 3. Superior deep margin    01/18/2021 Pathology Results   Only melanoma in situ seen in final resection, SLN negative   02/08/2021 - 03/22/2021 Chemotherapy         03/29/2021 Genetic Testing   Negative genetic testing on the Multi-cancer +RNA panel.  The report date is March 29, 2021.  The Multi-Gene Panel offered by [facility] includes sequencing and/or deletion duplication testing of the following 84 genes: AIP, ALK, APC, ATM, AXIN2,BAP1,  BARD1, BLM, BMPR1A, BRCA1, BRCA2, BRIP1, CASR, CDC73, CDH1, CDK4, CDKN1B, CDKN1C, CDKN2A (p14ARF), CDKN2A (p16INK4a), CEBPA, CHEK2, CTNNA1, DICER1, DIS3L2, EGFR (c.2369C>T, p.Thr790Met variant only), EPCAM (Deletion/duplication testing only), FH, FLCN, GATA2, GPC3, GREM1 (Promoter region deletion/duplication testing only), HOXB13 (c.251G>A, p.Gly84Glu), HRAS, KIT, MAX, MEN1, MET, MITF (c.952G>A, p.Glu318Lys variant only), MLH1, MSH2, MSH3, MSH6, MUTYH, NBN, NF1, NF2, NTHL1, PALB2, PDGFRA, PHOX2B, PMS2, POLD1, POLE, POT1, PRKAR1A, PTCH1, PTEN, RAD50, RAD51C, RAD51D, RB1, RECQL4, RET, RUNX1, SDHAF2, SDHA (sequence changes only), SDHB, SDHC, SDHD, SMAD4, SMARCA4, SMARCB1, SMARCE1, STK11, SUFU, TERC, TERT, TMEM127, TP53, TSC1, TSC2, VHL, WRN and WT1.     04/29/2021 PET scan   1. No vulvar mass or residual hypermetabolism. 2. No findings suspicious for metastatic disease.   11/28/2021 Pathology Results   Vulvar biopsy - fibrosis c/w scar. Small junctional nevus. No melanoma identified.   08/31/2022 PET scan   1. Enlarging RIGHT inguinal lymph node suspicious for metastatic disease. This shows only mild to moderate increased metabolic activity but displays FDG uptake greater than blood pool and shows abnormal morphology. Tissue sampling may be helpful. 2. No additional signs of disease in the neck, chest, abdomen or pelvis. 3. Signs of pelvic floor dysfunction and probable cystocele. 4. Chronic fracture of RIGHT inferior pubic ramus signs of healing since previous  imaging. 5. Aortic atherosclerosis. 6. Moderate to large hiatal hernia.   Aortic Atherosclerosis (ICD10-I70.0).   09/04/2022 Pathology Results   FINAL MICROSCOPIC DIAGNOSIS:   A. LYMPH NODE, RIGHT INGUINAL, BIOPSY:  Metastatic melanoma.  See comment.   COMMENT:  The tumor cells are loosely cohesive with foci of cytoplasmic pigment. Immunohistochemistry is positive with Melan-A, S100 and SOX10.  The morphology and immunophenotype are consistent with metastatic melanoma.      09/27/2022 Surgery   Surgery: Excision of enlarged right inguinal lymph node   Operative findings: 2 cm palpable but mobile right inguinal lymph node, hyperpigmented. No other palpable adenopathy.     Interval History: Denies edema. Left pressure dressing until Friday. Denies pain. Reports normal bladder and bowel function.   Past Medical/Surgical History: Past Medical History:  Diagnosis Date   Anemia    Arthritis    BMI 34.0-34.9,adult    Cancer (HCC)    melanoma   Complication of anesthesia    hard to wake up after anesthesia   Family history of melanoma    History of kidney stones    Melanotic macule of vulvar labia    Migraine    Polymyalgia rheumatica (HCC)    Venous insufficiency     Past Surgical History:  Procedure Laterality Date   Cataract Right 05/22/2022   CATARACT EXTRACTION Left 06/05/2022   COLONOSCOPY     INGUINAL LYMPHADENECTOMY Right 09/27/2022   Procedure: RIGHT INGUINAL LYMPH NODE DEBULKING;  Surgeon: Lafonda Mosses, MD;  Location: [facility];  Service: Gynecology;  Laterality: Right;   VULVECTOMY  01/19/2021   done at [facility], had sentinel node biopsy also    Family History  Problem  Relation Age of Onset   Diabetes Mother    AVM Father    Melanoma Father        multiple melanomas dx between 62-90   Cancer Father 35       tumor on kidney   Cancer Paternal Grandfather        laryngeal cancer   Cancer Daughter 18       appendiceal cancer   Breast cancer  Neg Hx    Ovarian cancer Neg Hx    Colon cancer Neg Hx    Uterine cancer Neg Hx    Colon polyps Neg Hx    Esophageal cancer Neg Hx    Stomach cancer Neg Hx    Rectal cancer Neg Hx     Social History   Socioeconomic History   Marital status: Married    Spouse name: Not on file   Number of children: 2   Years of education: Not on file   Highest education level: 12th grade  Occupational History   Occupation: Economist   Occupation: stocking  Tobacco Use   Smoking status: Never   Smokeless tobacco: Never  Vaping Use   Vaping Use: Never used  Substance and Sexual Activity   Alcohol use: Yes    Comment: occasional   Drug use: No   Sexual activity: Not Currently  Other Topics Concern   Not on file  Social History Narrative   Not on file   Social Determinants of Health   Financial Resource Strain: Not on file  Food Insecurity: No Food Insecurity (10/03/2022)   Hunger Vital Sign    Worried About Running Out of Food in the Last Year: Never true    Ran Out of Food in the Last Year: Never true  Transportation Needs: No Transportation Needs (10/03/2022)   PRAPARE - Hydrologist (Medical): No    Lack of Transportation (Non-Medical): No  Physical Activity: Insufficiently Active (10/03/2022)   Exercise Vital Sign    Days of Exercise per Week: 7 days    Minutes of Exercise per Session: 20 min  Stress: No Stress Concern Present (10/03/2022)   [place]    Feeling of Stress : Not at all  Social Connections: Socially Isolated (10/03/2022)   Social Connection and Isolation Panel [NHANES]    Frequency of Communication with Friends and Family: Never    Frequency of Social Gatherings with Friends and Family: Once a week    Attends Religious Services: Never    Marine scientist or Organizations: No    Attends Music therapist: Not on file    Marital Status: Married     Current Medications:  Current Outpatient Medications:    acetaminophen (TYLENOL) 500 MG tablet, Take 500 mg by mouth every 6 (six) hours as needed for moderate pain., Disp: , Rfl:    ammonium lactate (LAC-HYDRIN) 12 % lotion, Apply 1 Application topically as needed., Disp: , Rfl:    calcium carbonate (TUMS - DOSED IN MG ELEMENTAL CALCIUM) 500 MG chewable tablet, Chew 1-2 tablets by mouth daily as needed for indigestion or heartburn., Disp: , Rfl:    hydrochlorothiazide (HYDRODIURIL) 25 MG tablet, TAKE 1 TABLET BY MOUTH DAILY AS NEEDED. (Patient taking differently: Take 25 mg by mouth daily.), Disp: 90 tablet, Rfl: 3   ibuprofen (ADVIL) 200 MG tablet, Take 200 mg by mouth every 6 (six) hours as needed for moderate pain., Disp: ,  Rfl:    KLOR-CON M20 20 MEQ tablet, TAKE 1 TABLET BY MOUTH EVERY DAY, Disp: 90 tablet, Rfl: 1   Menthol-Methyl Salicylate (SALONPAS PAIN RELIEF PATCH EX), Apply 1 patch topically daily as needed (pain)., Disp: , Rfl:    Multiple Vitamins-Minerals (MULTIVITAMIN WITH MINERALS) tablet, Take 1 tablet by mouth daily., Disp: , Rfl:    predniSONE (DELTASONE) 10 MG tablet, TAKE 1 TABLET (10 MG TOTAL) BY MOUTH DAILY WITH BREAKFAST., Disp: 30 tablet, Rfl: 0  Review of Symptoms: Pertinent positives as per HPI.  Physical Exam: Deferred given limitations of phone visit.  Laboratory & Radiologic Studies: Surgical pathology still pending  Assessment & Plan: NYCHELLE CASSATA is a 67 y.o. woman with recurrent and metastatic melanoma s/p excision of enlarged inguinal lymph node (biopsy proven melanoma).  Doing well. Reviewed continued expectations. Pathology is still pending (waiting on IHC) but spoke with pathologist, looks consistent with metastatic melanoma. Patient amenable to seeing Dr. Alvy Bimler again to discuss adjuvant immunotherapy.  I discussed the assessment and treatment plan with the patient. The patient was provided with an opportunity to ask questions and all were  answered. The patient agreed with the plan and demonstrated an understanding of the instructions.   The patient was advised to call back or see an in-person evaluation if the symptoms worsen or if the condition fails to improve as anticipated.   8 minutes of total time was spent for this patient encounter, including preparation, phone counseling with the patient and coordination of care, and documentation of the encounter.   [name], MD  Division of Gynecologic Oncology  Department of Obstetrics and Gynecology  [facility]

## 2022-10-05 ENCOUNTER — Telehealth: Payer: Self-pay | Admitting: Oncology

## 2022-10-05 NOTE — Telephone Encounter (Signed)
Ariel Rios and scheduled an appointment with Dr. Alvy Bimler on 10/13/22 at 12:20.  She verbalized understanding and agreement of appointment date and time.

## 2022-10-06 ENCOUNTER — Telehealth: Payer: Self-pay

## 2022-10-06 ENCOUNTER — Ambulatory Visit (INDEPENDENT_AMBULATORY_CARE_PROVIDER_SITE_OTHER): Payer: Medicare HMO | Admitting: Family Medicine

## 2022-10-06 ENCOUNTER — Encounter: Payer: Self-pay | Admitting: Family Medicine

## 2022-10-06 VITALS — BP 110/70 | HR 71 | Temp 97.8°F | Resp 16 | Ht 62.0 in | Wt 185.0 lb

## 2022-10-06 DIAGNOSIS — Z23 Encounter for immunization: Secondary | ICD-10-CM | POA: Diagnosis not present

## 2022-10-06 DIAGNOSIS — E785 Hyperlipidemia, unspecified: Secondary | ICD-10-CM

## 2022-10-06 DIAGNOSIS — R7303 Prediabetes: Secondary | ICD-10-CM

## 2022-10-06 DIAGNOSIS — I700 Atherosclerosis of aorta: Secondary | ICD-10-CM

## 2022-10-06 DIAGNOSIS — M353 Polymyalgia rheumatica: Secondary | ICD-10-CM | POA: Diagnosis not present

## 2022-10-06 DIAGNOSIS — Z780 Asymptomatic menopausal state: Secondary | ICD-10-CM

## 2022-10-06 DIAGNOSIS — E876 Hypokalemia: Secondary | ICD-10-CM

## 2022-10-06 DIAGNOSIS — C774 Secondary and unspecified malignant neoplasm of inguinal and lower limb lymph nodes: Secondary | ICD-10-CM

## 2022-10-06 DIAGNOSIS — R600 Localized edema: Secondary | ICD-10-CM

## 2022-10-06 LAB — C-REACTIVE PROTEIN: CRP: <1 mg/dL (ref 0.5–20.0)

## 2022-10-06 LAB — LIPID PANEL
Cholesterol: 253 mg/dL — ABNORMAL HIGH (ref 0–200)
HDL: 71.2 mg/dL (ref >39.00)
LDL Cholesterol: 157 mg/dL — ABNORMAL HIGH (ref 0–99)
NonHDL: 181.31
Total CHOL/HDL Ratio: 4
Triglycerides: 123 mg/dL (ref 0.0–149.0)
VLDL: 24.6 mg/dL (ref 0.0–40.0)

## 2022-10-06 LAB — HEMOGLOBIN A1C: Hgb A1c MFr Bld: 5.8 % (ref 4.6–6.5)

## 2022-10-06 LAB — SEDIMENTATION RATE: Sed Rate: 18 mm/hr (ref 0–30)

## 2022-10-06 MED ORDER — PREDNISONE 10 MG PO TABS: 10.0000 mg | ORAL_TABLET | Freq: Every day | ORAL | 3 refills | Status: DC

## 2022-10-06 NOTE — Assessment & Plan Note (Signed)
Potassium was 3.5 in 08/2022. Continue K-Lor 20 mK daily.

## 2022-10-06 NOTE — Assessment & Plan Note (Addendum)
Problem is well-controlled. In general she has tolerated thiazide well, except for mild hypokalemia. Continue HCTZ 25 mg daily as needed.

## 2022-10-06 NOTE — Assessment & Plan Note (Signed)
She has been asymptomatic since starting prednisone, symptoms reoccurred when medication has been discontinued in the past. We discussed some side effects of medication. She will try to decrease dose from 10 mg daily to alternate between 5 mg every third day and 10 mg the rest of the days, if able to do so in 3 weeks she can go ahead and take 5 mg daily. We discussed other current options to treat problem, for now she would like to hold on rheumatology referral, she will let me know if she decides to do so. Said rate and CRP will be checked today. Given the fact she is seeing other providers a few times throughout the year and having blood work regularly, I think it is appropriate to follow annually, before if any problem.

## 2022-10-06 NOTE — Patient Instructions (Addendum)
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 Rheumatology consultation is appropriate , let me know if you would like to arrange appt.  If you need refills for medications you take chronically, please call your pharmacy. Do not use My Chart to request refills or for acute issues that need immediate attention. If you send a my chart message, it may take a few days to be addressed, specially if I am not in the office.  Please be sure medication list is accurate. If a new problem present, please set up appointment sooner than planned today.

## 2022-10-06 NOTE — Assessment & Plan Note (Signed)
She is not on statin medication. FLP checked today.

## 2022-10-06 NOTE — Assessment & Plan Note (Signed)
Hemoglobin A1c was 5.9 in 11/2021. Continue a healthy lifestyle for diabetes prevention. Further recommendation will be given according to hemoglobin A1c result.

## 2022-10-06 NOTE — Assessment & Plan Note (Signed)
Non pharmacologic treatment to continue for now. Further recommendations will be given according to 10 years CVD risk score and lipid panel numbers.  

## 2022-10-06 NOTE — Telephone Encounter (Signed)
Ms.Cooprider called office stating Ariel Rios is requesting more information. They need the pathology report faxed to their claims examiner [place] Ar) Fax# [phone]. Claim#[id number].  Pathology faxed and confirmed. Pt is aware

## 2022-10-07 NOTE — Assessment & Plan Note (Signed)
Primary: Vulvar melanoma. She did not tolerate chemotherapy. Following with oncologist and surgeon.

## 2022-10-11 ENCOUNTER — Other Ambulatory Visit: Payer: Self-pay | Admitting: Hematology and Oncology

## 2022-10-11 ENCOUNTER — Encounter: Payer: Self-pay | Admitting: Gynecologic Oncology

## 2022-10-13 ENCOUNTER — Encounter: Payer: Self-pay | Admitting: Hematology and Oncology

## 2022-10-13 ENCOUNTER — Inpatient Hospital Stay (HOSPITAL_BASED_OUTPATIENT_CLINIC_OR_DEPARTMENT_OTHER): Payer: Medicare HMO | Admitting: Hematology and Oncology

## 2022-10-13 VITALS — BP 131/68 | HR 78 | Temp 97.4°F | Resp 18 | Ht 62.0 in | Wt 187.4 lb

## 2022-10-13 DIAGNOSIS — C519 Malignant neoplasm of vulva, unspecified: Secondary | ICD-10-CM

## 2022-10-13 DIAGNOSIS — Z800 Family history of malignant neoplasm of digestive organs: Secondary | ICD-10-CM | POA: Diagnosis not present

## 2022-10-13 DIAGNOSIS — Z8051 Family history of malignant neoplasm of kidney: Secondary | ICD-10-CM | POA: Diagnosis not present

## 2022-10-13 DIAGNOSIS — Z808 Family history of malignant neoplasm of other organs or systems: Secondary | ICD-10-CM | POA: Diagnosis not present

## 2022-10-13 DIAGNOSIS — M353 Polymyalgia rheumatica: Secondary | ICD-10-CM

## 2022-10-13 DIAGNOSIS — C774 Secondary and unspecified malignant neoplasm of inguinal and lower limb lymph nodes: Secondary | ICD-10-CM | POA: Diagnosis not present

## 2022-10-13 NOTE — Assessment & Plan Note (Signed)
I have reviewed surgical report, imaging study and pathology report with the patient We discussed the current NCCN guidelines She has complete resection of the lymph node metastasis She is at high risk for recurrent disease Previously, she tolerated treatment very poorly due to severe arthralgias and myalgias, in combination of possible symptoms of steroid withdrawal We discussed risk, benefits, side effects of nivolumab for 1 year Ultimately, the most important decision point would be whether she can tolerate coming off of prednisone completely She has been on long-term prednisone treatment over the past year and a half I plan to see her again when she returns next month for further follow-up She needs to be off prednisone for at least 10 days before we can have meaningful discussion whether she can tolerate adjuvant treatment or not If not, she will just go on observation

## 2022-10-13 NOTE — Progress Notes (Signed)
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[facility]) I have reviewed surgical report, imaging study and pathology report with the patient We discussed the current NCCN guidelines She has complete resection of the lymph node metastasis She is at high risk for recurrent disease Previously, she tolerated treatment very poorly due to severe arthralgias and myalgias, in combination of possible symptoms of steroid withdrawal We discussed risk, benefits, side effects of nivolumab for 1 year Ultimately, the most important decision point would be whether she can tolerate coming off of prednisone completely She has been on long-term prednisone treatment over the past year and a half I plan to see her again when she returns next month for further follow-up She needs to be off prednisone for at least 10 days before we can have meaningful discussion whether she can tolerate adjuvant treatment or not If not, she will just go on observation  PMR (polymyalgia rheumatica) ([place]) She is highly dependent on corticosteroids After recent discussion with her primary care doctor, she has reduced the dose of prednisone to 5 mg We discussed gentle taper over the next 7 to 10 days If she cannot tolerate prednisone taper for at least 10 days by the next visit, then it is reasonable to consider adjuvant treatment with nivolumab  No orders of the defined types were placed in this encounter.   All questions were answered. The patient knows to call the clinic with any problems, questions or concerns. The total time spent in the appointment was 40 minutes encounter with patients including review of chart and various tests results, discussions about plan of care and coordination of care plan   Ariel Lark, MD 10/13/2022 2:01 PM  INTERVAL HISTORY: Please see below for problem oriented charting. she  returns for discussion about adjuvant treatment Since last time I saw her, she resumed taking prednisone 10 mg/day with resolution of her fatigue, bone aches and pain and others Unfortunately, she was discovered to have disease relapse She has completed recent surgery without complications We spent majority of our time reviewing plan of care, discussing the risk and benefits of adjuvant treatment  REVIEW OF SYSTEMS:   Constitutional: Denies fevers, chills or abnormal weight loss Eyes: Denies blurriness of vision Ears, nose, mouth, throat, and face: Denies mucositis or sore throat Respiratory: Denies cough, dyspnea or wheezes Cardiovascular: Denies palpitation, chest discomfort or lower extremity swelling Gastrointestinal:  Denies nausea, heartburn or change in bowel habits Skin: Denies abnormal skin rashes Lymphatics: Denies new lymphadenopathy or easy bruising Neurological:Denies numbness, tingling or new weaknesses Behavioral/Psych: Mood is stable, no new changes  All other systems were reviewed with the patient and are negative.  I have reviewed the past medical history, past surgical history, social history and family history with the patient and they are unchanged from previous note.  ALLERGIES:  has No Known Allergies.  MEDICATIONS:  Current Outpatient Medications  Medication Sig Dispense Refill   acetaminophen (TYLENOL) 500 MG tablet Take 500 mg by mouth every 6 (six) hours as needed for moderate pain.     ammonium lactate (LAC-HYDRIN) 12 % lotion Apply 1 Application topically as needed.     calcium carbonate (TUMS - DOSED IN MG ELEMENTAL CALCIUM) 500 MG chewable tablet Chew 1-2 tablets by mouth daily as needed for indigestion or heartburn.     hydrochlorothiazide (HYDRODIURIL) 25 MG tablet TAKE 1 TABLET BY MOUTH  DAILY AS NEEDED. (Patient taking differently: Take 25 mg by mouth daily.) 90 tablet 3   ibuprofen (ADVIL) 200 MG tablet Take 200 mg by mouth every 6 (six) hours as  needed for moderate pain.     KLOR-CON M20 20 MEQ tablet TAKE 1 TABLET BY MOUTH EVERY DAY 90 tablet 1   Menthol-Methyl Salicylate (SALONPAS PAIN RELIEF PATCH EX) Apply 1 patch topically daily as needed (pain).     Multiple Vitamins-Minerals (MULTIVITAMIN WITH MINERALS) tablet Take 1 tablet by mouth daily.     predniSONE (DELTASONE) 10 MG tablet Take 1 tablet (10 mg total) by mouth daily with breakfast. 90 tablet 3   No current facility-administered medications for this visit.    SUMMARY OF ONCOLOGIC HISTORY: Oncology History Overview Note  BRAF: undetectable (negative)   Melanoma of vulva ([place])  10/26/2020 Initial Diagnosis   She was noted to have bloody discharge in her underwear.  She denies pain   12/07/2020 Initial Biopsy   FINAL MICROSCOPIC DIAGNOSIS:   A.   RIGHT VULVA, BIOPSY: PRIMARY MALIGNANT MELANOMA OF VULVA, MELANOMA  TABLE BELOW  MALIGNANT MELANOMA (AJCC 8TH EDITION):  PROCEDURE: BIOPSY  SPECIMEN ANATOMIC SITE: RIGHT VULVA  BRESLOW'S DEPTH/MAXIMUM TUMOR THICKNESS: 5.05 MM  CLARK/ANATOMIC LEVEL* IV  MARGINS:  PERIPHERAL: INVOLVED  DEEP: FREE  ULCERATION: PRESENT  MITOTIC INDEX: 11/mm2  LYMPHO-VASCULAR INVASION: PRESENT*  NEUTROTROPISM: ABSENT  TUMOR-INFILTRATING LYMPHOCYTES: NON-BRISK  LYMPH NODES: N/A  PATHOLOGIC STAGE: PT4B NX MX  *VULVAR MELANOMAS MAY NOT HAVE THE SAME BEHAVIOR STAGE FOR STAGE AS SUPERFICIAL SPREADING CUTANEOUS MELANOMA, AND LANDMARKS SUCH AS BRESLOW'S DEPTH MAY NOT APPLY. NONETHELESS, THIS SHOULD BE TREATED AS A PT4B MELANOMA AND COMPLETE EXCISION, WITH POSSIBLE LYMPH NODE EVALUATION, UNDERTAKEN. SEE COMMENT FOR IHC PROFILE.    COMMENT:  Sections show confluent and nested growth of atypical melanocytes along vulvar epithelium in an in-situ melanoma growth pattern at the periphery, with a central large ulcerated nodule of similar atypical  melanocytes, consonant with primary vulvar melanoma. Notably, IHC was performed to evaluate the lineage and  is diagnostic for melanoma. Lesional melanocytes are positive with S100, MelanA, and show a high ki-67 uptake, and are negative with CDX-2, synaptophysin, CK20, TTF-1, CK7, CD56, and lymphoid markers CD5, CD3, CD20 highlight only background lymphocytes.    12/17/2020 Initial Diagnosis   Melanoma of vulva ([place])   12/17/2020 Cancer Staging   Staging form: Melanoma of the Skin, AJCC 8th Edition - Clinical stage from 12/17/2020: Stage III (rcT4b, cN2, cM0) - Signed by Ariel Lark, MD on 10/13/2022 Stage prefix: Recurrence   12/30/2020 PET scan   1. The only potential hypermetabolic finding of note is a small flat focus of accentuated metabolic activity along the anterior vaginal vestibule/perineum, maximum SUV 6.5. Most common cause for this type of appearance would be a trace amount of incontinence of urinary FDG, but given the proximity to the reported vulvar melanoma, this small focus is considered indeterminate. 2. Other imaging findings of potential clinical significance: Aortic Atherosclerosis (ICD10-I70.0). Small type 1 hiatal hernia. Palatine tonsilloliths. Colonic diverticulosis.   01/18/2021 Surgery   Preoperative Diagnosis: Vulvar melanoma  Postoperative Diagnosis: Same  Procedures performed: Wide radical vulvectomy, R inguinofemoral sentinel LN biopsy  Attending Surgeon: [name], MD  Fellow: [name]  Resident: Melony Overly, MD; Darrin Nipper, MD  Anesthesia: General endotracheal  Findings:  1. Vulva with biopsy site changes at R labia minora adjacent to the clitoris. No visible tumor. 2. Inguinofemoral nodes not clinically enlarged or abnormal.  Specimens:  1. R inguinal sentinel LN, hot and blue 2. Portion of vulva 3. Superior deep margin    01/18/2021 Pathology Results   Only melanoma in situ seen in final resection, SLN negative   02/08/2021 - 03/22/2021 Chemotherapy         03/29/2021 Genetic Testing   Negative genetic testing on the  Multi-cancer +RNA panel.  The report date is March 29, 2021.  The Multi-Gene Panel offered by [facility] includes sequencing and/or deletion duplication testing of the following 84 genes: AIP, ALK, APC, ATM, AXIN2,BAP1,  BARD1, BLM, BMPR1A, BRCA1, BRCA2, BRIP1, CASR, CDC73, CDH1, CDK4, CDKN1B, CDKN1C, CDKN2A (p14ARF), CDKN2A (p16INK4a), CEBPA, CHEK2, CTNNA1, DICER1, DIS3L2, EGFR (c.2369C>T, p.Thr790Met variant only), EPCAM (Deletion/duplication testing only), FH, FLCN, GATA2, GPC3, GREM1 (Promoter region deletion/duplication testing only), HOXB13 (c.251G>A, p.Gly84Glu), HRAS, KIT, MAX, MEN1, MET, MITF (c.952G>A, p.Glu318Lys variant only), MLH1, MSH2, MSH3, MSH6, MUTYH, NBN, NF1, NF2, NTHL1, PALB2, PDGFRA, PHOX2B, PMS2, POLD1, POLE, POT1, PRKAR1A, PTCH1, PTEN, RAD50, RAD51C, RAD51D, RB1, RECQL4, RET, RUNX1, SDHAF2, SDHA (sequence changes only), SDHB, SDHC, SDHD, SMAD4, SMARCA4, SMARCB1, SMARCE1, STK11, SUFU, TERC, TERT, TMEM127, TP53, TSC1, TSC2, VHL, WRN and WT1.     04/29/2021 PET scan   1. No vulvar mass or residual hypermetabolism. 2. No findings suspicious for metastatic disease.   11/28/2021 Pathology Results   Vulvar biopsy - fibrosis c/w scar. Small junctional nevus. No melanoma identified.   08/31/2022 PET scan   1. Enlarging RIGHT inguinal lymph node suspicious for metastatic disease. This shows only mild to moderate increased metabolic activity but displays FDG uptake greater than blood pool and shows abnormal morphology. Tissue sampling may be helpful. 2. No additional signs of disease in the neck, chest, abdomen or pelvis. 3. Signs of pelvic floor dysfunction and probable cystocele. 4. Chronic fracture of RIGHT inferior pubic ramus signs of healing since previous imaging. 5. Aortic atherosclerosis. 6. Moderate to large hiatal hernia.   Aortic Atherosclerosis (ICD10-I70.0).   09/04/2022 Pathology Results   FINAL MICROSCOPIC DIAGNOSIS:   A. LYMPH NODE, RIGHT INGUINAL, BIOPSY:  Metastatic  melanoma.  See comment.   COMMENT:  The tumor cells are loosely cohesive with foci of cytoplasmic pigment. Immunohistochemistry is positive with Melan-A, S100 and SOX10.  The morphology and immunophenotype are consistent with metastatic melanoma.      09/27/2022 Surgery   Surgery: Excision of enlarged right inguinal lymph node   Operative findings: 2 cm palpable but mobile right inguinal lymph node, hyperpigmented. No other palpable adenopathy.     PHYSICAL EXAMINATION: ECOG PERFORMANCE STATUS: 0 - Asymptomatic  Vitals:   10/13/22 1200  BP: 131/68  Pulse: 78  Resp: 18  Temp: (!) 97.4 F (36.3 C)  SpO2: 98%   Filed Weights   10/13/22 1200  Weight: 187 lb 6.4 oz (85 kg)    GENERAL:alert, no distress and comfortable NEURO: alert & oriented x 3 with fluent speech, no focal motor/sensory deficits  LABORATORY DATA:  I have reviewed the data as listed    Component Value Date/Time   NA 141 09/13/2022 0815   K 3.5 09/13/2022 0815   CL 109 09/13/2022 0815   CO2 26 09/13/2022 0815   GLUCOSE 95 09/13/2022 0815   BUN 18 09/13/2022 0815   CREATININE 0.55 09/13/2022 0815   CREATININE 0.67 04/26/2021 0803   CREATININE 0.82 08/23/2020 1048   CALCIUM 8.8 (L) 09/13/2022 0815   PROT 6.8 09/13/2022 0815   ALBUMIN 4.0 09/13/2022 0815   AST 13 (L) 09/13/2022 0815  AST 11 (L) 04/26/2021 0803   ALT 15 09/13/2022 0815   ALT 20 04/26/2021 0803   ALKPHOS 41 09/13/2022 0815   BILITOT 1.0 09/13/2022 0815   BILITOT 1.1 04/26/2021 0803   GFRNONAA >60 09/13/2022 0815   GFRNONAA >60 04/26/2021 0803   GFRNONAA 77 08/23/2020 1048   GFRAA 89 08/23/2020 1048    No results found for: "SPEP", "UPEP"  Lab Results  Component Value Date   WBC 5.4 09/13/2022   NEUTROABS 6.8 04/26/2021   HGB 12.4 09/13/2022   HCT 38.7 09/13/2022   MCV 96.8 09/13/2022   PLT 272 09/13/2022      Chemistry      Component Value Date/Time   NA 141 09/13/2022 0815   K 3.5 09/13/2022 0815   CL 109  09/13/2022 0815   CO2 26 09/13/2022 0815   BUN 18 09/13/2022 0815   CREATININE 0.55 09/13/2022 0815   CREATININE 0.67 04/26/2021 0803   CREATININE 0.82 08/23/2020 1048      Component Value Date/Time   CALCIUM 8.8 (L) 09/13/2022 0815   ALKPHOS 41 09/13/2022 0815   AST 13 (L) 09/13/2022 0815   AST 11 (L) 04/26/2021 0803   ALT 15 09/13/2022 0815   ALT 20 04/26/2021 0803   BILITOT 1.0 09/13/2022 0815   BILITOT 1.1 04/26/2021 0803       RADIOGRAPHIC STUDIES: I have reviewed PET CT imaging with the patient I have personally reviewed the radiological images as listed and agreed with the findings in the report.

## 2022-10-13 NOTE — Assessment & Plan Note (Signed)
She is highly dependent on corticosteroids After recent discussion with her primary care doctor, she has reduced the dose of prednisone to 5 mg We discussed gentle taper over the next 7 to 10 days If she cannot tolerate prednisone taper for at least 10 days by the next visit, then it is reasonable to consider adjuvant treatment with nivolumab

## 2022-10-16 ENCOUNTER — Other Ambulatory Visit: Payer: Self-pay | Admitting: Oncology

## 2022-10-16 NOTE — Progress Notes (Signed)
Gynecologic Oncology Multi-Disciplinary Disposition Conference Note  Date of the Conference: 10/16/2022  Patient Name: Ariel Rios  Referring Provider: Dr. Talbert Nan Primary GYN Oncologist: Dr. Berline Lopes   Stage/Disposition:  Recurrent and metastatic vulvar melanoma. Disposition is to adjuvant immunotherapy.   This Multidisciplinary conference took place involving physicians from [place], Medical Oncology, Radiation Oncology, Pathology, Radiology along with the Gynecologic Oncology Nurse Practitioner and Gynecologic Oncology Nurse Navigator.  Comprehensive assessment of the patient's malignancy, staging, need for surgery, chemotherapy, radiation therapy, and need for further testing were reviewed. Supportive measures, both inpatient and following discharge were also discussed. The recommended plan of care is documented. Greater than 35 minutes were spent correlating and coordinating this patient's care.

## 2022-10-24 ENCOUNTER — Other Ambulatory Visit: Payer: Self-pay | Admitting: Family Medicine

## 2022-10-24 DIAGNOSIS — Z1231 Encounter for screening mammogram for malignant neoplasm of breast: Secondary | ICD-10-CM

## 2022-10-25 ENCOUNTER — Encounter: Payer: Self-pay | Admitting: Family Medicine

## 2022-10-27 ENCOUNTER — Encounter: Payer: No Typology Code available for payment source | Admitting: Gynecologic Oncology

## 2022-11-02 ENCOUNTER — Inpatient Hospital Stay: Payer: Medicare HMO | Attending: Gynecologic Oncology | Admitting: Gynecologic Oncology

## 2022-11-02 ENCOUNTER — Inpatient Hospital Stay (HOSPITAL_BASED_OUTPATIENT_CLINIC_OR_DEPARTMENT_OTHER): Payer: No Typology Code available for payment source | Admitting: Hematology and Oncology

## 2022-11-02 ENCOUNTER — Encounter: Payer: Self-pay | Admitting: Gynecologic Oncology

## 2022-11-02 ENCOUNTER — Encounter: Payer: Self-pay | Admitting: Hematology and Oncology

## 2022-11-02 VITALS — BP 142/72 | HR 82 | Resp 18 | Ht 62.0 in | Wt 190.2 lb

## 2022-11-02 VITALS — BP 142/72 | HR 82 | Resp 18 | Wt 190.2 lb

## 2022-11-02 DIAGNOSIS — C519 Malignant neoplasm of vulva, unspecified: Secondary | ICD-10-CM | POA: Diagnosis present

## 2022-11-02 DIAGNOSIS — M353 Polymyalgia rheumatica: Secondary | ICD-10-CM

## 2022-11-02 DIAGNOSIS — Z79899 Other long term (current) drug therapy: Secondary | ICD-10-CM | POA: Diagnosis not present

## 2022-11-02 DIAGNOSIS — C774 Secondary and unspecified malignant neoplasm of inguinal and lower limb lymph nodes: Secondary | ICD-10-CM

## 2022-11-02 DIAGNOSIS — Z9889 Other specified postprocedural states: Secondary | ICD-10-CM

## 2022-11-02 DIAGNOSIS — Z5112 Encounter for antineoplastic immunotherapy: Secondary | ICD-10-CM | POA: Insufficient documentation

## 2022-11-02 DIAGNOSIS — C779 Secondary and unspecified malignant neoplasm of lymph node, unspecified: Secondary | ICD-10-CM | POA: Insufficient documentation

## 2022-11-02 NOTE — Assessment & Plan Note (Signed)
I have reviewed surgical report, imaging study and pathology report with the patient We discussed the current NCCN guidelines She has complete resection of the lymph node metastasis She is at high risk for recurrent disease Previously, she tolerated treatment very poorly due to severe arthralgias and myalgias, in combination of possible symptoms of steroid withdrawal This time, she is able to get herself weaned off to prednisone and tolerated that for the past 10 days  We discussed risk, benefits, side effects of nivolumab for 1 year She is willing to proceed We will get her started within the next 10 days I will see her prior to cycle 2 of therapy

## 2022-11-02 NOTE — Progress Notes (Signed)
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[facility]) I have reviewed surgical report, imaging study and pathology report with the patient We discussed the current NCCN guidelines She has complete resection of the lymph node metastasis She is at high risk for recurrent disease Previously, she tolerated treatment very poorly due to severe arthralgias and myalgias, in combination of possible symptoms of steroid withdrawal This time, she is able to get herself weaned off to prednisone and tolerated that for the past 10 days  We discussed risk, benefits, side effects of nivolumab for 1 year She is willing to proceed We will get her started within the next 10 days I will see her prior to cycle 2 of therapy  PMR (polymyalgia rheumatica) ([place]) She is able to get off prednisone We discussed conservative approach with acetaminophen as needed  Orders Placed This Encounter  Procedures   CBC with Differential ([place] Only)    Standing Status:   Future    Standing Expiration Date:   11/14/2023   CMP ([place] only)    Standing Status:   Future    Standing Expiration Date:   11/14/2023   T4    Standing Status:   Future    Standing Expiration Date:   11/14/2023   TSH    Standing Status:   Future    Standing Expiration Date:   11/14/2023   CBC with Differential ([place] Only)    Standing Status:   Future    Standing Expiration Date:   12/12/2023   CMP ([place] only)    Standing Status:   Future    Standing Expiration Date:   12/12/2023   CBC with Differential ([place] Only)    Standing Status:   Future    Standing Expiration Date:   01/09/2024   CMP ([place] only)    Standing Status:   Future    Standing Expiration Date:   01/09/2024   T4    Standing Status:   Future    Standing Expiration Date:   01/09/2024   TSH    Standing Status:    Future    Standing Expiration Date:   01/09/2024   CBC with Differential ([facility] Only)    Standing Status:   Future    Standing Expiration Date:   02/06/2024   CMP ([place] only)    Standing Status:   Future    Standing Expiration Date:   02/06/2024   CBC with Differential ([facility] Only)    Standing Status:   Future    Standing Expiration Date:   03/05/2024   CMP ([place] only)    Standing Status:   Future    Standing Expiration Date:   03/05/2024   CBC with Differential ([facility] Only)    Standing Status:   Future    Standing Expiration Date:   04/02/2024   CMP ([place] only)    Standing Status:   Future    Standing Expiration Date:   04/02/2024   T4    Standing Status:   Future    Standing Expiration Date:   04/02/2024   TSH    Standing Status:   Future    Standing Expiration Date:   04/02/2024    All questions were answered. The patient knows to call the clinic with any problems, questions or concerns. The  total time spent in the appointment was 40 minutes encounter with patients including review of chart and various tests results, discussions about plan of care and coordination of care plan   [name], MD 11/02/2022 2:52 PM  INTERVAL HISTORY: Please see below for problem oriented charting. she returns for discussion about role of adjuvant treatment The patient was able to get herself off prednisone for the last 10 days She denies significant exacerbation of her muscle aches She is taking acetaminophen as needed only Her wound is healing well We discussed future treatment option  REVIEW OF SYSTEMS:   Constitutional: Denies fevers, chills or abnormal weight loss Eyes: Denies blurriness of vision Ears, nose, mouth, throat, and face: Denies mucositis or sore throat Respiratory: Denies cough, dyspnea or wheezes Cardiovascular: Denies palpitation, chest discomfort or lower extremity swelling Gastrointestinal:  Denies nausea, heartburn or change in  bowel habits Skin: Denies abnormal skin rashes Lymphatics: Denies new lymphadenopathy or easy bruising Neurological:Denies numbness, tingling or new weaknesses Behavioral/Psych: Mood is stable, no new changes  All other systems were reviewed with the patient and are negative.  I have reviewed the past medical history, past surgical history, social history and family history with the patient and they are unchanged from previous note.  ALLERGIES:  has No Known Allergies.  MEDICATIONS:  Current Outpatient Medications  Medication Sig Dispense Refill   acetaminophen (TYLENOL) 650 MG CR tablet Take 650 mg by mouth every 8 (eight) hours as needed for pain.     ammonium lactate (LAC-HYDRIN) 12 % lotion Apply 1 Application topically as needed.     calcium carbonate (TUMS - DOSED IN MG ELEMENTAL CALCIUM) 500 MG chewable tablet Chew 1-2 tablets by mouth daily as needed for indigestion or heartburn.     hydrochlorothiazide (HYDRODIURIL) 25 MG tablet TAKE 1 TABLET BY MOUTH DAILY AS NEEDED. (Patient taking differently: Take 25 mg by mouth daily.) 90 tablet 3   KLOR-CON M20 20 MEQ tablet TAKE 1 TABLET BY MOUTH EVERY DAY 90 tablet 1   Menthol-Methyl Salicylate (SALONPAS PAIN RELIEF PATCH EX) Apply 1 patch topically daily as needed (pain).     Multiple Vitamins-Minerals (MULTIVITAMIN WITH MINERALS) tablet Take 1 tablet by mouth daily.     No current facility-administered medications for this visit.    SUMMARY OF ONCOLOGIC HISTORY: Oncology History Overview Note  BRAF: undetectable (negative)   Melanoma of vulva ([place])  10/26/2020 Initial Diagnosis   She was noted to have bloody discharge in her underwear.  She denies pain   12/07/2020 Initial Biopsy   FINAL MICROSCOPIC DIAGNOSIS:   A.   RIGHT VULVA, BIOPSY: PRIMARY MALIGNANT MELANOMA OF VULVA, MELANOMA  TABLE BELOW  MALIGNANT MELANOMA (AJCC 8TH EDITION):  PROCEDURE: BIOPSY  SPECIMEN ANATOMIC SITE: RIGHT VULVA  BRESLOW'S DEPTH/MAXIMUM TUMOR  THICKNESS: 5.05 MM  CLARK/ANATOMIC LEVEL* IV  MARGINS:  PERIPHERAL: INVOLVED  DEEP: FREE  ULCERATION: PRESENT  MITOTIC INDEX: 11/mm2  LYMPHO-VASCULAR INVASION: PRESENT*  NEUTROTROPISM: ABSENT  TUMOR-INFILTRATING LYMPHOCYTES: NON-BRISK  LYMPH NODES: N/A  PATHOLOGIC STAGE: PT4B NX MX  *VULVAR MELANOMAS MAY NOT HAVE THE SAME BEHAVIOR STAGE FOR STAGE AS SUPERFICIAL SPREADING CUTANEOUS MELANOMA, AND LANDMARKS SUCH AS BRESLOW'S DEPTH MAY NOT APPLY. NONETHELESS, THIS SHOULD BE TREATED AS A PT4B MELANOMA AND COMPLETE EXCISION, WITH POSSIBLE LYMPH NODE EVALUATION, UNDERTAKEN. SEE COMMENT FOR IHC PROFILE.    COMMENT:  Sections show confluent and nested growth of atypical melanocytes along vulvar epithelium in an in-situ melanoma growth pattern at the periphery, with a central large ulcerated  nodule of similar atypical  melanocytes, consonant with primary vulvar melanoma. Notably, IHC was performed to evaluate the lineage and is diagnostic for melanoma. Lesional melanocytes are positive with S100, MelanA, and show a high ki-67 uptake, and are negative with CDX-2, synaptophysin, CK20, TTF-1, CK7, CD56, and lymphoid markers CD5, CD3, CD20 highlight only background lymphocytes.    12/17/2020 Initial Diagnosis   Melanoma of vulva ([place])   12/17/2020 Cancer Staging   Staging form: Melanoma of the Skin, AJCC 8th Edition - Clinical stage from 12/17/2020: Stage III (rcT4b, cN2, cM0) - Signed by [name], MD on 10/13/2022 Stage prefix: Recurrence   12/30/2020 PET scan   1. The only potential hypermetabolic finding of note is a small flat focus of accentuated metabolic activity along the anterior vaginal vestibule/perineum, maximum SUV 6.5. Most common cause for this type of appearance would be a trace amount of incontinence of urinary FDG, but given the proximity to the reported vulvar melanoma, this small focus is considered indeterminate. 2. Other imaging findings of potential clinical significance: Aortic  Atherosclerosis (ICD10-I70.0). Small type 1 hiatal hernia. Palatine tonsilloliths. Colonic diverticulosis.   01/18/2021 Surgery   Preoperative Diagnosis: Vulvar melanoma  Postoperative Diagnosis: Same  Procedures performed: Wide radical vulvectomy, R inguinofemoral sentinel LN biopsy  Attending Surgeon: [name], MD  Fellow: [name]  Resident: Melony Overly, MD; Darrin Nipper, MD  Anesthesia: General endotracheal  Findings:  1. Vulva with biopsy site changes at R labia minora adjacent to the clitoris. No visible tumor. 2. Inguinofemoral nodes not clinically enlarged or abnormal.   Specimens:  1. R inguinal sentinel LN, hot and blue 2. Portion of vulva 3. Superior deep margin    01/18/2021 Pathology Results   Only melanoma in situ seen in final resection, SLN negative   02/08/2021 - 03/22/2021 Chemotherapy         03/29/2021 Genetic Testing   Negative genetic testing on the Multi-cancer +RNA panel.  The report date is March 29, 2021.  The Multi-Gene Panel offered by [facility] includes sequencing and/or deletion duplication testing of the following 84 genes: AIP, ALK, APC, ATM, AXIN2,BAP1,  BARD1, BLM, BMPR1A, BRCA1, BRCA2, BRIP1, CASR, CDC73, CDH1, CDK4, CDKN1B, CDKN1C, CDKN2A (p14ARF), CDKN2A (p16INK4a), CEBPA, CHEK2, CTNNA1, DICER1, DIS3L2, EGFR (c.2369C>T, p.Thr790Met variant only), EPCAM (Deletion/duplication testing only), FH, FLCN, GATA2, GPC3, GREM1 (Promoter region deletion/duplication testing only), HOXB13 (c.251G>A, p.Gly84Glu), HRAS, KIT, MAX, MEN1, MET, MITF (c.952G>A, p.Glu318Lys variant only), MLH1, MSH2, MSH3, MSH6, MUTYH, NBN, NF1, NF2, NTHL1, PALB2, PDGFRA, PHOX2B, PMS2, POLD1, POLE, POT1, PRKAR1A, PTCH1, PTEN, RAD50, RAD51C, RAD51D, RB1, RECQL4, RET, RUNX1, SDHAF2, SDHA (sequence changes only), SDHB, SDHC, SDHD, SMAD4, SMARCA4, SMARCB1, SMARCE1, STK11, SUFU, TERC, TERT, TMEM127, TP53, TSC1, TSC2, VHL, WRN and WT1.     04/29/2021 PET scan   1. No  vulvar mass or residual hypermetabolism. 2. No findings suspicious for metastatic disease.   11/28/2021 Pathology Results   Vulvar biopsy - fibrosis c/w scar. Small junctional nevus. No melanoma identified.   08/31/2022 PET scan   1. Enlarging RIGHT inguinal lymph node suspicious for metastatic disease. This shows only mild to moderate increased metabolic activity but displays FDG uptake greater than blood pool and shows abnormal morphology. Tissue sampling may be helpful. 2. No additional signs of disease in the neck, chest, abdomen or pelvis. 3. Signs of pelvic floor dysfunction and probable cystocele. 4. Chronic fracture of RIGHT inferior pubic ramus signs of healing since previous imaging. 5. Aortic atherosclerosis. 6. Moderate to large hiatal hernia.  Aortic Atherosclerosis (ICD10-I70.0).   09/04/2022 Pathology Results   FINAL MICROSCOPIC DIAGNOSIS:   A. LYMPH NODE, RIGHT INGUINAL, BIOPSY:  Metastatic melanoma.  See comment.   COMMENT:  The tumor cells are loosely cohesive with foci of cytoplasmic pigment. Immunohistochemistry is positive with Melan-A, S100 and SOX10.  The morphology and immunophenotype are consistent with metastatic melanoma.      09/27/2022 Surgery   Surgery: Excision of enlarged right inguinal lymph node   Operative findings: 2 cm palpable but mobile right inguinal lymph node, hyperpigmented. No other palpable adenopathy.   11/13/2022 -  Chemotherapy   Patient is on Treatment Plan : MELANOMA Nivolumab (480) q28d       PHYSICAL EXAMINATION: ECOG PERFORMANCE STATUS: 1 - Symptomatic but completely ambulatory  Vitals:   11/02/22 1411  BP: (!) 142/72  Pulse: 82  Resp: 18  SpO2: 97%   Filed Weights   11/02/22 1411  Weight: 190 lb 3.2 oz (86.3 kg)    GENERAL:alert, no distress and comfortable SKIN: skin color, texture, turgor are normal, no rashes or significant lesions.  Noted well-healed surgical scar NEURO: alert & oriented x 3 with fluent  speech, no focal motor/sensory deficits  LABORATORY DATA:  I have reviewed the data as listed    Component Value Date/Time   NA 141 09/13/2022 0815   K 3.5 09/13/2022 0815   CL 109 09/13/2022 0815   CO2 26 09/13/2022 0815   GLUCOSE 95 09/13/2022 0815   BUN 18 09/13/2022 0815   CREATININE 0.55 09/13/2022 0815   CREATININE 0.67 04/26/2021 0803   CREATININE 0.82 08/23/2020 1048   CALCIUM 8.8 (L) 09/13/2022 0815   PROT 6.8 09/13/2022 0815   ALBUMIN 4.0 09/13/2022 0815   AST 13 (L) 09/13/2022 0815   AST 11 (L) 04/26/2021 0803   ALT 15 09/13/2022 0815   ALT 20 04/26/2021 0803   ALKPHOS 41 09/13/2022 0815   BILITOT 1.0 09/13/2022 0815   BILITOT 1.1 04/26/2021 0803   GFRNONAA >60 09/13/2022 0815   GFRNONAA >60 04/26/2021 0803   GFRNONAA 77 08/23/2020 1048   GFRAA 89 08/23/2020 1048    No results found for: "SPEP", "UPEP"  Lab Results  Component Value Date   WBC 5.4 09/13/2022   NEUTROABS 6.8 04/26/2021   HGB 12.4 09/13/2022   HCT 38.7 09/13/2022   MCV 96.8 09/13/2022   PLT 272 09/13/2022      Chemistry      Component Value Date/Time   NA 141 09/13/2022 0815   K 3.5 09/13/2022 0815   CL 109 09/13/2022 0815   CO2 26 09/13/2022 0815   BUN 18 09/13/2022 0815   CREATININE 0.55 09/13/2022 0815   CREATININE 0.67 04/26/2021 0803   CREATININE 0.82 08/23/2020 1048      Component Value Date/Time   CALCIUM 8.8 (L) 09/13/2022 0815   ALKPHOS 41 09/13/2022 0815   AST 13 (L) 09/13/2022 0815   AST 11 (L) 04/26/2021 0803   ALT 15 09/13/2022 0815   ALT 20 04/26/2021 0803   BILITOT 1.0 09/13/2022 0815   BILITOT 1.1 04/26/2021 0803

## 2022-11-02 NOTE — Progress Notes (Signed)
DISCONTINUE OFF PATHWAY REGIMEN - Melanoma and Other Skin Cancers   [id number]:Pembrolizumab 200 mg IV D1 q21 Days:   A cycle is every 21 days:     Pembrolizumab   **Always confirm dose/schedule in your pharmacy ordering system**  REASON: Other Reason PRIOR TREATMENT: Off Pathway: Pembrolizumab 200 mg IV D1 q21 Days TREATMENT RESPONSE: Complete Response (CR)  START ON PATHWAY REGIMEN - Melanoma and Other Skin Cancers     A cycle is every 28 days:     Nivolumab   **Always confirm dose/schedule in your pharmacy ordering system**  Patient Characteristics: Melanoma, Cutaneous/Unknown Primary, Local/In Transit/Nodal Recurrence, Resected - No Prior Neoadjuvant Immunotherapy, BRAF V600 Wild Type / BRAF V600 Results Pending or Unknown Disease Classification: Melanoma Disease Subtype: Cutaneous BRAF V600 Mutation Status: BRAF V600 Wild Type (No Mutation) Therapeutic Status: Local/In Transit/Nodal Recurrence Intent of Therapy: Curative Intent, Discussed with Patient

## 2022-11-02 NOTE — Patient Instructions (Signed)
It was good to see you today.  I am glad you are healing so well from surgery.  Please call if you need anything.  I will plan to see you back in 3 months and we will get your next CT scan in April.

## 2022-11-02 NOTE — Progress Notes (Signed)
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  She denies pain   12/07/2020 Initial Biopsy   FINAL MICROSCOPIC DIAGNOSIS:   A.   RIGHT VULVA, BIOPSY: PRIMARY MALIGNANT MELANOMA OF VULVA, MELANOMA  TABLE BELOW  MALIGNANT MELANOMA (AJCC 8TH EDITION):  PROCEDURE: BIOPSY  SPECIMEN ANATOMIC SITE: RIGHT VULVA  BRESLOW'S DEPTH/MAXIMUM TUMOR THICKNESS: 5.05 MM  CLARK/ANATOMIC LEVEL* IV  MARGINS:  PERIPHERAL: INVOLVED  DEEP: FREE  ULCERATION: PRESENT  MITOTIC INDEX: 11/mm2  LYMPHO-VASCULAR INVASION: PRESENT*  NEUTROTROPISM: ABSENT  TUMOR-INFILTRATING LYMPHOCYTES: NON-BRISK  LYMPH NODES: N/A  PATHOLOGIC STAGE: PT4B NX MX  *VULVAR MELANOMAS MAY NOT HAVE THE SAME BEHAVIOR STAGE FOR STAGE AS SUPERFICIAL SPREADING CUTANEOUS MELANOMA, AND LANDMARKS SUCH AS BRESLOW'S DEPTH MAY NOT APPLY. NONETHELESS, THIS SHOULD BE TREATED AS A PT4B MELANOMA AND COMPLETE EXCISION, WITH POSSIBLE LYMPH NODE EVALUATION, UNDERTAKEN. SEE COMMENT FOR IHC PROFILE.    COMMENT:  Sections show confluent and nested growth of atypical melanocytes along vulvar epithelium in an in-situ melanoma growth pattern at the periphery, with a central large ulcerated nodule of similar atypical  melanocytes, consonant with primary vulvar melanoma. Notably, IHC was performed to evaluate the lineage and is diagnostic for melanoma. Lesional melanocytes are positive with S100, MelanA, and show a high ki-67 uptake, and are negative with CDX-2, synaptophysin, CK20, TTF-1, CK7, CD56, and lymphoid markers CD5, CD3, CD20 highlight only background lymphocytes.    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 The only potential hypermetabolic finding of note is a small flat focus of accentuated metabolic activity along the anterior vaginal vestibule/perineum, maximum SUV 6.5. Most common cause for this type of appearance would be a trace amount of incontinence of urinary FDG, but given the proximity to the reported vulvar melanoma, this small focus is considered indeterminate. 2. Other imaging findings of potential clinical significance: Aortic Atherosclerosis (ICD10-I70.0). Small type 1 hiatal hernia. Palatine tonsilloliths. Colonic diverticulosis.   01/18/2021 Surgery   Preoperative Diagnosis: Vulvar melanoma  Postoperative Diagnosis: Same  Procedures performed: Wide radical vulvectomy, R inguinofemoral sentinel LN biopsy  Attending Surgeon: [name], MD  Fellow: [name]  Resident: Melony Overly, MD; Darrin Nipper, MD  Anesthesia: General endotracheal  Findings:  1. Vulva with biopsy site changes at R labia minora adjacent to the clitoris. No visible tumor. 2. Inguinofemoral nodes not clinically enlarged or abnormal.   Specimens:  1. R inguinal sentinel LN, hot and blue 2. Portion of vulva 3. Superior deep margin    01/18/2021 Pathology Results   Only melanoma in situ seen in final resection, SLN negative   02/08/2021 - 03/22/2021 Chemotherapy         03/29/2021 Genetic Testing   Negative genetic testing on the Multi-cancer +RNA panel.  The report date is March 29, 2021.  The Multi-Gene Panel offered by [facility] includes sequencing and/or deletion duplication testing of the following 84 genes: AIP, ALK, APC, ATM, AXIN2,BAP1,  BARD1, BLM, BMPR1A, BRCA1, BRCA2, BRIP1, CASR, CDC73, CDH1, CDK4, CDKN1B, CDKN1C, CDKN2A (  p14ARF), CDKN2A (p16INK4a), CEBPA, CHEK2, CTNNA1, DICER1, DIS3L2, EGFR (c.2369C>T,  p.Thr790Met variant only), EPCAM (Deletion/duplication testing only), FH, FLCN, GATA2, GPC3, GREM1 (Promoter region deletion/duplication testing only), HOXB13 (c.251G>A, p.Gly84Glu), HRAS, KIT, MAX, MEN1, MET, MITF (c.952G>A, p.Glu318Lys variant only), MLH1, MSH2, MSH3, MSH6, MUTYH, NBN, NF1, NF2, NTHL1, PALB2, PDGFRA, PHOX2B, PMS2, POLD1, POLE, POT1, PRKAR1A, PTCH1, PTEN, RAD50, RAD51C, RAD51D, RB1, RECQL4, RET, RUNX1, SDHAF2, SDHA (sequence changes only), SDHB, SDHC, SDHD, SMAD4, SMARCA4, SMARCB1, SMARCE1, STK11, SUFU, TERC, TERT, TMEM127, TP53, TSC1, TSC2, VHL, WRN and WT1.     04/29/2021 PET scan   1. No vulvar mass or residual hypermetabolism. 2. No findings suspicious for metastatic disease.   11/28/2021 Pathology Results   Vulvar biopsy - fibrosis c/w scar. Small junctional nevus. No melanoma identified.   08/31/2022 PET scan   1. Enlarging RIGHT inguinal lymph node suspicious for metastatic disease. This shows only mild to moderate increased metabolic activity but displays FDG uptake greater than blood pool and shows abnormal morphology. Tissue sampling may be helpful. 2. No additional signs of disease in the neck, chest, abdomen or pelvis. 3. Signs of pelvic floor dysfunction and probable cystocele. 4. Chronic fracture of RIGHT inferior pubic ramus signs of healing since previous imaging. 5. Aortic atherosclerosis. 6. Moderate to large hiatal hernia.   Aortic Atherosclerosis (ICD10-I70.0).   09/04/2022 Pathology Results   FINAL MICROSCOPIC DIAGNOSIS:   A. LYMPH NODE, RIGHT INGUINAL, BIOPSY:  Metastatic melanoma.  See comment.   COMMENT:  The tumor cells are loosely cohesive with foci of cytoplasmic pigment. Immunohistochemistry is positive with Melan-A, S100 and SOX10.  The morphology and immunophenotype are consistent with metastatic melanoma.      09/27/2022 Surgery   Surgery: Excision of enlarged right inguinal lymph node   Operative findings: 2 cm palpable but mobile right  inguinal lymph node, hyperpigmented. No other palpable adenopathy.   11/13/2022 -  Chemotherapy   Patient is on Treatment Plan : MELANOMA Nivolumab (480) q28d       Interval History: Doing well.  Denies any pain associated with her incision in the postoperative period.  Reports baseline bowel bladder function.  Has now weaned herself off of steroids completely.  Uses Tylenol if needed in the morning.  Denies any significant joint pain.  Past Medical/Surgical History: Past Medical History:  Diagnosis Date   Anemia    Arthritis    BMI 34.0-34.9,adult    Cancer (HCC)    melanoma   Complication of anesthesia    hard to wake up after anesthesia   Family history of melanoma    History of kidney stones    Melanotic macule of vulvar labia    Migraine    Polymyalgia rheumatica (HCC)    Venous insufficiency     Past Surgical History:  Procedure Laterality Date   Cataract Right 05/22/2022   CATARACT EXTRACTION Left 06/05/2022   COLONOSCOPY     INGUINAL LYMPHADENECTOMY Right 09/27/2022   Procedure: RIGHT INGUINAL LYMPH NODE DEBULKING;  Surgeon: Lafonda Mosses, MD;  Location: [facility];  Service: Gynecology;  Laterality: Right;   VULVECTOMY  01/19/2021   done at [facility], had sentinel node biopsy also    Family History  Problem Relation Age of Onset   Diabetes Mother    AVM Father    Melanoma Father        multiple melanomas dx between 20-90   Cancer Father 33       tumor on kidney   Cancer Paternal Grandfather  laryngeal cancer   Cancer Daughter 15       appendiceal cancer   Breast cancer Neg Hx    Ovarian cancer Neg Hx    Colon cancer Neg Hx    Uterine cancer Neg Hx    Colon polyps Neg Hx    Esophageal cancer Neg Hx    Stomach cancer Neg Hx    Rectal cancer Neg Hx     Social History   Socioeconomic History   Marital status: Married    Spouse name: Not on file   Number of children: 2   Years of education: Not on file   Highest education level: 12th  grade  Occupational History   Occupation: Economist   Occupation: stocking  Tobacco Use   Smoking status: Never   Smokeless tobacco: Never  Vaping Use   Vaping Use: Never used  Substance and Sexual Activity   Alcohol use: Yes    Comment: occasional   Drug use: No   Sexual activity: Not Currently  Other Topics Concern   Not on file  Social History Narrative   Not on file   Social Determinants of Health   Financial Resource Strain: Not on file  Food Insecurity: No Food Insecurity (10/03/2022)   Hunger Vital Sign    Worried About Running Out of Food in the Last Year: Never true    Ran Out of Food in the Last Year: Never true  Transportation Needs: No Transportation Needs (10/03/2022)   PRAPARE - Hydrologist (Medical): No    Lack of Transportation (Non-Medical): No  Physical Activity: Insufficiently Active (10/03/2022)   Exercise Vital Sign    Days of Exercise per Week: 7 days    Minutes of Exercise per Session: 20 min  Stress: No Stress Concern Present (10/03/2022)   [place]    Feeling of Stress : Not at all  Social Connections: Socially Isolated (10/03/2022)   Social Connection and Isolation Panel [NHANES]    Frequency of Communication with Friends and Family: Never    Frequency of Social Gatherings with Friends and Family: Once a week    Attends Religious Services: Never    Marine scientist or Organizations: No    Attends Music therapist: Not on file    Marital Status: Married    Current Medications:  Current Outpatient Medications:    ammonium lactate (LAC-HYDRIN) 12 % lotion, Apply 1 Application topically as needed., Disp: , Rfl:    calcium carbonate (TUMS - DOSED IN MG ELEMENTAL CALCIUM) 500 MG chewable tablet, Chew 1-2 tablets by mouth daily as needed for indigestion or heartburn., Disp: , Rfl:    hydrochlorothiazide (HYDRODIURIL) 25 MG tablet, TAKE 1  TABLET BY MOUTH DAILY AS NEEDED. (Patient taking differently: Take 25 mg by mouth daily.), Disp: 90 tablet, Rfl: 3   KLOR-CON M20 20 MEQ tablet, TAKE 1 TABLET BY MOUTH EVERY DAY, Disp: 90 tablet, Rfl: 1   Menthol-Methyl Salicylate (SALONPAS PAIN RELIEF PATCH EX), Apply 1 patch topically daily as needed (pain)., Disp: , Rfl:    Multiple Vitamins-Minerals (MULTIVITAMIN WITH MINERALS) tablet, Take 1 tablet by mouth daily., Disp: , Rfl:    acetaminophen (TYLENOL) 650 MG CR tablet, Take 650 mg by mouth every 8 (eight) hours as needed for pain., Disp: , Rfl:   Review of Systems: Denies appetite changes, fevers, chills, fatigue, unexplained weight changes. Denies hearing loss, neck lumps or masses, mouth  sores, ringing in ears or voice changes. Denies cough or wheezing.  Denies shortness of breath. Denies chest pain or palpitations. Denies leg swelling. Denies abdominal distention, pain, blood in stools, constipation, diarrhea, nausea, vomiting, or early satiety. Denies pain with intercourse, dysuria, frequency, hematuria or incontinence. Denies hot flashes, pelvic pain, vaginal bleeding or vaginal discharge.   Denies joint pain, back pain or muscle pain/cramps. Denies itching, rash, or wounds. Denies dizziness, headaches, numbness or seizures. Denies swollen lymph nodes or glands, denies easy bruising or bleeding. Denies anxiety, depression, confusion, or decreased concentration.  Physical Exam: BP (!) 142/72   Pulse 82   Resp 18   Wt 190 lb 3.2 oz (86.3 kg)   SpO2 97%   BMI 34.79 kg/m  General: Alert, oriented, no acute distress. HEENT: Normocephalic, atraumatic, sclera anicteric. Chest: Unlabored breathing on room air. Skin: Well-healed right groin incision.  No erythema, induration, or exudate.  Laboratory & Radiologic Studies: A. LYMPH NODE, RIGHT INGUINAL, EXCISION:  - Metastatic malignant melanoma, see comment   Assessment & Plan: Ariel Rios is a 67 y.o. 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  The patient is doing very well postoperatively.  She is met with Dr. Alvy Bimler already and has weaned herself off prednisone.  Plan is to try immunotherapy again with nivolumab this time.  I will plan to see her back for follow-up in 3 months.  We have her next imaging scheduled for April.  16 minutes of total time was spent for this patient encounter, including preparation, face-to-face counseling with the patient and coordination of care, and documentation of the encounter.  [name], MD  Division of Gynecologic Oncology  Department of Obstetrics and Gynecology  [facility]

## 2022-11-02 NOTE — Assessment & Plan Note (Signed)
She is able to get off prednisone We discussed conservative approach with acetaminophen as needed

## 2022-11-03 ENCOUNTER — Encounter: Payer: Self-pay | Admitting: Hematology and Oncology

## 2022-11-03 ENCOUNTER — Other Ambulatory Visit: Payer: Self-pay

## 2022-11-07 ENCOUNTER — Encounter: Payer: Self-pay | Admitting: Hematology and Oncology

## 2022-11-07 NOTE — Progress Notes (Signed)
Pharmacist Chemotherapy Monitoring - Initial Assessment    Anticipated start date: 10/1922   The following has been reviewed per standard work regarding the patient's treatment regimen: The patient's diagnosis, treatment plan and drug doses, and organ/hematologic function Lab orders and baseline tests specific to treatment regimen  The treatment plan start date, drug sequencing, and pre-medications Prior authorization status  Patient's documented medication list, including drug-drug interaction screen and prescriptions for anti-emetics and supportive care specific to the treatment regimen The drug concentrations, fluid compatibility, administration routes, and timing of the medications to be used The patient's access for treatment and lifetime cumulative dose history, if applicable  The patient's medication allergies and previous infusion related reactions, if applicable   Changes made to treatment plan:  N/A  Follow up needed:  Pending authorization for treatment    Acquanetta Belling, RPH, BCPS, BCOP 11/07/2022  8:28 AM

## 2022-11-13 ENCOUNTER — Inpatient Hospital Stay: Payer: Medicare HMO

## 2022-11-13 VITALS — BP 129/80 | HR 64 | Temp 97.8°F | Resp 18 | Wt 189.9 lb

## 2022-11-13 DIAGNOSIS — C519 Malignant neoplasm of vulva, unspecified: Secondary | ICD-10-CM

## 2022-11-13 DIAGNOSIS — Z5112 Encounter for antineoplastic immunotherapy: Secondary | ICD-10-CM | POA: Diagnosis not present

## 2022-11-13 LAB — CBC WITH DIFFERENTIAL (CANCER CENTER ONLY)
Abs Immature Granulocytes: 0 10*3/uL (ref 0.00–0.07)
Basophils Absolute: 0 10*3/uL (ref 0.0–0.1)
Basophils Relative: 1 %
Eosinophils Absolute: 0.1 10*3/uL (ref 0.0–0.5)
Eosinophils Relative: 2 %
HCT: 37.8 % (ref 36.0–46.0)
Hemoglobin: 12.7 g/dL (ref 12.0–15.0)
Immature Granulocytes: 0 %
Lymphocytes Relative: 34 %
Lymphs Abs: 1.5 10*3/uL (ref 0.7–4.0)
MCH: 30.8 pg (ref 26.0–34.0)
MCHC: 33.6 g/dL (ref 30.0–36.0)
MCV: 91.7 fL (ref 80.0–100.0)
Monocytes Absolute: 0.4 10*3/uL (ref 0.1–1.0)
Monocytes Relative: 9 %
Neutro Abs: 2.5 10*3/uL (ref 1.7–7.7)
Neutrophils Relative %: 54 %
Platelet Count: 269 10*3/uL (ref 150–400)
RBC: 4.12 MIL/uL (ref 3.87–5.11)
RDW: 12.6 % (ref 11.5–15.5)
WBC Count: 4.6 10*3/uL (ref 4.0–10.5)
nRBC: 0 % (ref 0.0–0.2)

## 2022-11-13 LAB — CMP (CANCER CENTER ONLY)
ALT: 19 U/L (ref 0–44)
AST: 19 U/L (ref 15–41)
Albumin: 4.2 g/dL (ref 3.5–5.0)
Alkaline Phosphatase: 50 U/L (ref 38–126)
Anion gap: 9 (ref 5–15)
BUN: 14 mg/dL (ref 8–23)
CO2: 28 mmol/L (ref 22–32)
Calcium: 9.2 mg/dL (ref 8.9–10.3)
Chloride: 106 mmol/L (ref 98–111)
Creatinine: 0.58 mg/dL (ref 0.44–1.00)
GFR, Estimated: >60 mL/min (ref >60)
Glucose, Bld: 80 mg/dL (ref 70–99)
Potassium: 3.2 mmol/L — ABNORMAL LOW (ref 3.5–5.1)
Sodium: 143 mmol/L (ref 135–145)
Total Bilirubin: 0.9 mg/dL (ref 0.3–1.2)
Total Protein: 6.6 g/dL (ref 6.5–8.1)

## 2022-11-13 LAB — TSH: TSH: 3.812 u[IU]/mL (ref 0.350–4.500)

## 2022-11-13 MED ORDER — SODIUM CHLORIDE 0.9 % IV SOLN
480.0000 mg | Freq: Once | INTRAVENOUS | Status: AC
  Administered 2022-11-13: 480 mg via INTRAVENOUS
  Filled 2022-11-13: qty 48

## 2022-11-13 MED ORDER — HEPARIN SOD (PORK) LOCK FLUSH 100 UNIT/ML IV SOLN: 500.0000 [IU] | Freq: Once | INTRAVENOUS | Status: DC | PRN

## 2022-11-13 MED ORDER — SODIUM CHLORIDE 0.9% FLUSH: 10.0000 mL | INTRAVENOUS | Status: DC | PRN

## 2022-11-13 MED ORDER — SODIUM CHLORIDE 0.9 % IV SOLN: Freq: Once | INTRAVENOUS | Status: AC

## 2022-11-13 NOTE — Patient Instructions (Signed)
[facility]  Discharge Instructions: Thank you for choosing [facility] to provide your oncology and hematology care.   If you have a lab appointment with the [facility], please go directly to the [facility] and check in at the registration area.   Wear comfortable clothing and clothing appropriate for easy access to any Portacath or PICC line.   We strive to give you quality time with your provider. You may need to reschedule your appointment if you arrive late (15 or more minutes).  Arriving late affects you and other patients whose appointments are after yours.  Also, if you miss three or more appointments without notifying the office, you may be dismissed from the clinic at the provider's discretion.      For prescription refill requests, have your pharmacy contact our office and allow 72 hours for refills to be completed.    Today you received the following chemotherapy and/or immunotherapy agents: Opdivo      To help prevent nausea and vomiting after your treatment, we encourage you to take your nausea medication as directed.  BELOW ARE SYMPTOMS THAT SHOULD BE REPORTED IMMEDIATELY: *FEVER GREATER THAN 100.4 F (38 C) OR HIGHER *CHILLS OR SWEATING *NAUSEA AND VOMITING THAT IS NOT CONTROLLED WITH YOUR NAUSEA MEDICATION *UNUSUAL SHORTNESS OF BREATH *UNUSUAL BRUISING OR BLEEDING *URINARY PROBLEMS (pain or burning when urinating, or frequent urination) *BOWEL PROBLEMS (unusual diarrhea, constipation, pain near the anus) TENDERNESS IN MOUTH AND THROAT WITH OR WITHOUT PRESENCE OF ULCERS (sore throat, sores in mouth, or a toothache) UNUSUAL RASH, SWELLING OR PAIN  UNUSUAL VAGINAL DISCHARGE OR ITCHING   Items with * indicate a potential emergency and should be followed up as soon as possible or go to the Emergency Department if any problems should occur.  Please show the CHEMOTHERAPY ALERT CARD or IMMUNOTHERAPY ALERT CARD at check-in  to the Emergency Department and triage nurse.  Should you have questions after your visit or need to cancel or reschedule your appointment, please contact [facility]  Dept: [phone]  and follow the prompts.  Office hours are 8:00 a.m. to 4:30 p.m. Monday - Friday. Please note that voicemails left after 4:00 p.m. may not be returned until the following business day.  We are closed weekends and major holidays. You have access to a nurse at all times for urgent questions. Please call the main number to the clinic Dept: [phone] and follow the prompts.   For any non-urgent questions, you may also contact your provider using MyChart. We now offer e-Visits for anyone 41 and older to request care online for non-urgent symptoms. For details visit [email or web address].   Also download the MyChart app! Go to the app store, search "MyChart", open the app, select [facility], and log in with your MyChart username and password.  Nivolumab Injection What is this medication? NIVOLUMAB (nye VOL ue mab) treats some types of cancer. It works by helping your immune system slow or stop the spread of cancer cells. It is a monoclonal antibody. This medicine may be used for other purposes; ask your health care provider or pharmacist if you have questions. COMMON BRAND NAME(S): Opdivo What should I tell my care team before I take this medication? They need to know if you have any of these conditions: Allogeneic stem cell transplant (uses someone else's stem cells) Autoimmune diseases, such as Crohn disease, ulcerative colitis, lupus History of chest radiation Nervous system  problems, such as Guillain-Barre syndrome or myasthenia gravis Organ transplant An unusual or allergic reaction to nivolumab, other medications, foods, dyes, or preservatives Pregnant or trying to get pregnant Breast-feeding How should I use this medication? This medication is infused into a vein.  It is given in a hospital or clinic setting. A special MedGuide will be given to you before each treatment. Be sure to read this information carefully each time. Talk to your care team about the use of this medication in children. While it may be prescribed for children as young as 12 years for selected conditions, precautions do apply. Overdosage: If you think you have taken too much of this medicine contact a poison control center or emergency room at once. NOTE: This medicine is only for you. Do not share this medicine with others. What if I miss a dose? Keep appointments for follow-up doses. It is important not to miss your dose. Call your care team if you are unable to keep an appointment. What may interact with this medication? Interactions have not been studied. This list may not describe all possible interactions. Give your health care provider a list of all the medicines, herbs, non-prescription drugs, or dietary supplements you use. Also tell them if you smoke, drink alcohol, or use illegal drugs. Some items may interact with your medicine. What should I watch for while using this medication? Your condition will be monitored carefully while you are receiving this medication. You may need blood work while taking this medication. This medication may cause serious skin reactions. They can happen weeks to months after starting the medication. Contact your care team right away if you notice fevers or flu-like symptoms with a rash. The rash may be red or purple and then turn into blisters or peeling of the skin. You may also notice a red rash with swelling of the face, lips, or lymph nodes in your neck or under your arms. Tell your care team right away if you have any change in your eyesight. Talk to your care team if you are pregnant or think you might be pregnant. A negative pregnancy test is required before starting this medication. A reliable form of contraception is recommended while taking  this medication and for 5 months after the last dose. Talk to your care team about effective forms of contraception. Do not breast-feed while taking this medication and for 5 months after the last dose. What side effects may I notice from receiving this medication? Side effects that you should report to your care team as soon as possible: Allergic reactions--skin rash, itching, hives, swelling of the face, lips, tongue, or throat Dry cough, shortness of breath or trouble breathing Eye pain, redness, irritation, or discharge with blurry or decreased vision Heart muscle inflammation--unusual weakness or fatigue, shortness of breath, chest pain, fast or irregular heartbeat, dizziness, swelling of the ankles, feet, or hands Hormone gland problems--headache, sensitivity to light, unusual weakness or fatigue, dizziness, fast or irregular heartbeat, increased sensitivity to cold or heat, excessive sweating, constipation, hair loss, increased thirst or amount of urine, tremors or shaking, irritability Infusion reactions--chest pain, shortness of breath or trouble breathing, feeling faint or lightheaded Kidney injury (glomerulonephritis)--decrease in the amount of urine, red or dark brown urine, foamy or bubbly urine, swelling of the ankles, hands, or feet Liver injury--right upper belly pain, loss of appetite, nausea, light-colored stool, dark yellow or brown urine, yellowing skin or eyes, unusual weakness or fatigue Pain, tingling, or numbness in the hands or feet,  muscle weakness, change in vision, confusion or trouble speaking, loss of balance or coordination, trouble walking, seizures Rash, fever, and swollen lymph nodes Redness, blistering, peeling, or loosening of the skin, including inside the mouth Sudden or severe stomach pain, bloody diarrhea, fever, nausea, vomiting Side effects that usually do not require medical attention (report these to your care team if they continue or are  bothersome): Bone, joint, or muscle pain Diarrhea Fatigue Loss of appetite Nausea Skin rash This list may not describe all possible side effects. Call your doctor for medical advice about side effects. You may report side effects to FDA at 1-800-FDA-1088. Where should I keep my medication? This medication is given in a hospital or clinic. It will not be stored at home. NOTE: This sheet is a summary. It may not cover all possible information. If you have questions about this medicine, talk to your doctor, pharmacist, or health care provider.  2023 Elsevier/Gold Standard (2022-01-09 00:00:00)

## 2022-11-14 ENCOUNTER — Encounter: Payer: Self-pay | Admitting: Hematology and Oncology

## 2022-11-14 NOTE — Telephone Encounter (Signed)
-----   Message from Rafael Bihari, RN sent at 11/13/2022 10:37 AM EST ----- Regarding: Dr Alvy Bimler pt. first time Nivolumab Dr Alvy Bimler pt came in 2/19 for first time Nivolumab. Tolerated infusion well. Needs call back.

## 2022-11-14 NOTE — Telephone Encounter (Signed)
Called pt to see how she did with her treatment.  She reports doing well.  Since off prednisone, she has some discomfort but tolerable for now.  He knows how to reach [place] if needed & knows her next appt.  Encouraged to call with any questions/concerns.

## 2022-11-15 LAB — T4: T4, Total: 7 ug/dL (ref 4.5–12.0)

## 2022-11-21 ENCOUNTER — Encounter: Payer: Self-pay | Admitting: Hematology and Oncology

## 2022-11-24 ENCOUNTER — Ambulatory Visit
Admission: RE | Admit: 2022-11-24 | Discharge: 2022-11-24 | Disposition: A | Discharge status: Home / Self Care | Payer: Medicare HMO | Source: Ambulatory Visit | Attending: Family Medicine | Admitting: Family Medicine

## 2022-11-24 DIAGNOSIS — Z780 Asymptomatic menopausal state: Secondary | ICD-10-CM

## 2022-12-05 ENCOUNTER — Telehealth: Payer: Self-pay | Admitting: Family Medicine

## 2022-12-05 NOTE — Telephone Encounter (Addendum)
Pt would like MD to review her Bone Density results and make any necessary comments.

## 2022-12-08 ENCOUNTER — Encounter: Payer: Self-pay | Admitting: Family Medicine

## 2022-12-08 NOTE — Telephone Encounter (Signed)
See result note.  

## 2022-12-11 ENCOUNTER — Inpatient Hospital Stay: Payer: Medicare HMO

## 2022-12-11 ENCOUNTER — Inpatient Hospital Stay (HOSPITAL_BASED_OUTPATIENT_CLINIC_OR_DEPARTMENT_OTHER): Payer: Medicare HMO | Admitting: Hematology and Oncology

## 2022-12-11 ENCOUNTER — Encounter: Payer: Self-pay | Admitting: Hematology and Oncology

## 2022-12-11 ENCOUNTER — Inpatient Hospital Stay: Payer: Medicare HMO | Attending: Gynecologic Oncology

## 2022-12-11 VITALS — BP 125/67 | HR 80 | Temp 97.6°F | Resp 18 | Ht 62.0 in | Wt 184.8 lb

## 2022-12-11 DIAGNOSIS — C519 Malignant neoplasm of vulva, unspecified: Secondary | ICD-10-CM

## 2022-12-11 DIAGNOSIS — Z5112 Encounter for antineoplastic immunotherapy: Secondary | ICD-10-CM | POA: Insufficient documentation

## 2022-12-11 DIAGNOSIS — M353 Polymyalgia rheumatica: Secondary | ICD-10-CM | POA: Diagnosis not present

## 2022-12-11 LAB — CBC WITH DIFFERENTIAL (CANCER CENTER ONLY)
Abs Immature Granulocytes: 0.01 10*3/uL (ref 0.00–0.07)
Basophils Absolute: 0 10*3/uL (ref 0.0–0.1)
Basophils Relative: 1 %
Eosinophils Absolute: 0.1 10*3/uL (ref 0.0–0.5)
Eosinophils Relative: 2 %
HCT: 38.9 % (ref 36.0–46.0)
Hemoglobin: 13 g/dL (ref 12.0–15.0)
Immature Granulocytes: 0 %
Lymphocytes Relative: 28 %
Lymphs Abs: 1.4 10*3/uL (ref 0.7–4.0)
MCH: 30.4 pg (ref 26.0–34.0)
MCHC: 33.4 g/dL (ref 30.0–36.0)
MCV: 91.1 fL (ref 80.0–100.0)
Monocytes Absolute: 0.4 10*3/uL (ref 0.1–1.0)
Monocytes Relative: 8 %
Neutro Abs: 3 10*3/uL (ref 1.7–7.7)
Neutrophils Relative %: 61 %
Platelet Count: 284 10*3/uL (ref 150–400)
RBC: 4.27 MIL/uL (ref 3.87–5.11)
RDW: 12.6 % (ref 11.5–15.5)
WBC Count: 4.9 10*3/uL (ref 4.0–10.5)
nRBC: 0 % (ref 0.0–0.2)

## 2022-12-11 LAB — CMP (CANCER CENTER ONLY)
ALT: 11 U/L (ref 0–44)
AST: 14 U/L — ABNORMAL LOW (ref 15–41)
Albumin: 4.4 g/dL (ref 3.5–5.0)
Alkaline Phosphatase: 56 U/L (ref 38–126)
Anion gap: 9 (ref 5–15)
BUN: 14 mg/dL (ref 8–23)
CO2: 29 mmol/L (ref 22–32)
Calcium: 9.9 mg/dL (ref 8.9–10.3)
Chloride: 103 mmol/L (ref 98–111)
Creatinine: 0.55 mg/dL (ref 0.44–1.00)
GFR, Estimated: >60 mL/min (ref >60)
Glucose, Bld: 92 mg/dL (ref 70–99)
Potassium: 3.3 mmol/L — ABNORMAL LOW (ref 3.5–5.1)
Sodium: 141 mmol/L (ref 135–145)
Total Bilirubin: 1 mg/dL (ref 0.3–1.2)
Total Protein: 7.3 g/dL (ref 6.5–8.1)

## 2022-12-11 MED ORDER — SODIUM CHLORIDE 0.9 % IV SOLN: Freq: Once | INTRAVENOUS | Status: AC

## 2022-12-11 MED ORDER — SODIUM CHLORIDE 0.9 % IV SOLN
480.0000 mg | Freq: Once | INTRAVENOUS | Status: AC
  Administered 2022-12-11: 480 mg via INTRAVENOUS
  Filled 2022-12-11: qty 48

## 2022-12-11 NOTE — Assessment & Plan Note (Signed)
She has no recent flare of pain She will continue acetaminophen as needed

## 2022-12-11 NOTE — Progress Notes (Signed)
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   All questions were answered. The patient knows to call the clinic with any problems, questions or concerns. The total time spent in the appointment was 20 minutes encounter with patients including review of chart and various tests results, discussions about plan of care and coordination of care plan   Ariel Lark, MD 12/11/2022 9:59 AM  INTERVAL HISTORY: Please see below for problem oriented charting. she returns for treatment follow-up Her mother was recently diagnosed with triple negative breast cancer but negative for genetic mutation.  The patient was still tested negative for mutation. She tolerated treatment well without major flare of bone pain.  She had mild insomnia  REVIEW OF SYSTEMS:   Constitutional: Denies fevers, chills or abnormal weight loss Eyes: Denies blurriness of vision Ears, nose, mouth, throat, and face: Denies mucositis or sore throat Respiratory: Denies cough, dyspnea or wheezes Cardiovascular: Denies palpitation, chest discomfort or lower extremity swelling Gastrointestinal:  Denies nausea, heartburn or change in bowel habits Skin: Denies abnormal skin rashes Lymphatics: Denies new lymphadenopathy or easy bruising Neurological:Denies numbness, tingling or new weaknesses Behavioral/Psych: Mood is stable, no new changes  All other systems were reviewed with the patient and are negative.  I have reviewed the past medical history, past surgical history, social history and family history  with the patient and they are unchanged from previous note.  ALLERGIES:  has No Known Allergies.  MEDICATIONS:  Current Outpatient Medications  Medication Sig Dispense Refill   cholecalciferol (VITAMIN D3) 25 MCG (1000 UNIT) tablet Take 2,000 Units by mouth daily.     acetaminophen (TYLENOL) 650 MG CR tablet Take 650 mg by mouth every 8 (eight) hours as needed for pain.     ammonium lactate (LAC-HYDRIN) 12 % lotion Apply 1 Application topically as needed.     calcium carbonate (TUMS - DOSED IN MG ELEMENTAL CALCIUM) 500 MG chewable tablet Chew 1-2 tablets by mouth daily as needed for indigestion or heartburn.     hydrochlorothiazide (HYDRODIURIL) 25 MG tablet TAKE 1 TABLET BY MOUTH DAILY AS NEEDED. (Patient taking differently: Take 25 mg by mouth daily.) 90 tablet 3   KLOR-CON M20 20 MEQ tablet TAKE 1 TABLET BY MOUTH EVERY DAY 90 tablet 1   Menthol-Methyl Salicylate (SALONPAS PAIN RELIEF PATCH EX) Apply 1 patch topically daily as needed (pain).     Multiple Vitamins-Minerals (MULTIVITAMIN WITH MINERALS) tablet Take 1 tablet by mouth daily.     No current facility-administered medications for this visit.   Facility-Administered Medications Ordered in Other Visits  Medication Dose Route Frequency Provider Last Rate Last Admin   nivolumab (OPDIVO) 480 mg in sodium chloride 0.9 % 100 mL chemo infusion  480 mg Intravenous Once Ariel Rios, Natalyah Cummiskey, MD        SUMMARY OF ONCOLOGIC HISTORY: Oncology History Overview Note  BRAF: undetectable (negative)   Melanoma of vulva ([place])  10/26/2020 Initial Diagnosis   She was noted to have bloody discharge in her underwear.  She denies pain   12/07/2020 Initial Biopsy   FINAL MICROSCOPIC DIAGNOSIS:  A.   RIGHT VULVA, BIOPSY: PRIMARY MALIGNANT MELANOMA OF VULVA, MELANOMA  TABLE BELOW  MALIGNANT MELANOMA (AJCC 8TH EDITION):  PROCEDURE: BIOPSY  SPECIMEN ANATOMIC SITE: RIGHT VULVA  BRESLOW'S DEPTH/MAXIMUM TUMOR THICKNESS: 5.05 MM  CLARK/ANATOMIC LEVEL* IV   MARGINS:  PERIPHERAL: INVOLVED  DEEP: FREE  ULCERATION: PRESENT  MITOTIC INDEX: 11/mm2  LYMPHO-VASCULAR INVASION: PRESENT*  NEUTROTROPISM: ABSENT  TUMOR-INFILTRATING LYMPHOCYTES: NON-BRISK  LYMPH NODES: N/A  PATHOLOGIC STAGE: PT4B NX MX  *VULVAR MELANOMAS MAY NOT HAVE THE SAME BEHAVIOR STAGE FOR STAGE AS SUPERFICIAL SPREADING CUTANEOUS MELANOMA, AND LANDMARKS SUCH AS BRESLOW'S DEPTH MAY NOT APPLY. NONETHELESS, THIS SHOULD BE TREATED AS A PT4B MELANOMA AND COMPLETE EXCISION, WITH POSSIBLE LYMPH NODE EVALUATION, UNDERTAKEN. SEE COMMENT FOR IHC PROFILE.    COMMENT:  Sections show confluent and nested growth of atypical melanocytes along vulvar epithelium in an in-situ melanoma growth pattern at the periphery, with a central large ulcerated nodule of similar atypical  melanocytes, consonant with primary vulvar melanoma. Notably, IHC was performed to evaluate the lineage and is diagnostic for melanoma. Lesional melanocytes are positive with S100, MelanA, and show a high ki-67 uptake, and are negative with CDX-2, synaptophysin, CK20, TTF-1, CK7, CD56, and lymphoid markers CD5, CD3, CD20 highlight only background lymphocytes.    12/17/2020 Initial Diagnosis   Melanoma of vulva ([place])   12/17/2020 Cancer Staging   Staging form: Melanoma of the Skin, AJCC 8th Edition - Clinical stage from 12/17/2020: Stage III (rcT4b, cN2, cM0) - Signed by Ariel Lark, MD on 10/13/2022 Stage prefix: Recurrence   12/30/2020 PET scan   1. The only potential hypermetabolic finding of note is a small flat focus of accentuated metabolic activity along the anterior vaginal vestibule/perineum, maximum SUV 6.5. Most common cause for this type of appearance would be a trace amount of incontinence of urinary FDG, but given the proximity to the reported vulvar melanoma, this small focus is considered indeterminate. 2. Other imaging findings of potential clinical significance: Aortic Atherosclerosis (ICD10-I70.0). Small type 1  hiatal hernia. Palatine tonsilloliths. Colonic diverticulosis.   01/18/2021 Surgery   Preoperative Diagnosis: Vulvar melanoma  Postoperative Diagnosis: Same  Procedures performed: Wide radical vulvectomy, R inguinofemoral sentinel LN biopsy  Attending Surgeon: [name], MD  Fellow: [name]  Resident: Melony Overly, MD; Darrin Nipper, MD  Anesthesia: General endotracheal  Findings:  1. Vulva with biopsy site changes at R labia minora adjacent to the clitoris. No visible tumor. 2. Inguinofemoral nodes not clinically enlarged or abnormal.   Specimens:  1. R inguinal sentinel LN, hot and blue 2. Portion of vulva 3. Superior deep margin    01/18/2021 Pathology Results   Only melanoma in situ seen in final resection, SLN negative   02/08/2021 - 03/22/2021 Chemotherapy         03/29/2021 Genetic Testing   Negative genetic testing on the Multi-cancer +RNA panel.  The report date is March 29, 2021.  The Multi-Gene Panel offered by [facility] includes sequencing and/or deletion duplication testing of the following 84 genes: AIP, ALK, APC, ATM, AXIN2,BAP1,  BARD1, BLM, BMPR1A, BRCA1, BRCA2, BRIP1, CASR, CDC73, CDH1, CDK4, CDKN1B, CDKN1C, CDKN2A (p14ARF), CDKN2A (p16INK4a), CEBPA, CHEK2, CTNNA1, DICER1, DIS3L2, EGFR (c.2369C>T, p.Thr790Met variant only), EPCAM (Deletion/duplication testing only), FH, FLCN, GATA2, GPC3, GREM1 (Promoter region deletion/duplication testing only), HOXB13 (c.251G>A, p.Gly84Glu), HRAS, KIT, MAX, MEN1, MET, MITF (c.952G>A, p.Glu318Lys variant only), MLH1, MSH2, MSH3, MSH6, MUTYH, NBN, NF1, NF2, NTHL1, PALB2, PDGFRA, PHOX2B, PMS2, POLD1, POLE, POT1, PRKAR1A, PTCH1, PTEN, RAD50, RAD51C, RAD51D, RB1, RECQL4,  RET, RUNX1, SDHAF2, SDHA (sequence changes only), SDHB, SDHC, SDHD, SMAD4, SMARCA4, SMARCB1, SMARCE1, STK11, SUFU, TERC, TERT, TMEM127, TP53, TSC1, TSC2, VHL, WRN and WT1.     04/29/2021 PET scan   1. No vulvar mass or residual hypermetabolism. 2.  No findings suspicious for metastatic disease.   11/28/2021 Pathology Results   Vulvar biopsy - fibrosis c/w scar. Small junctional nevus. No melanoma identified.   08/31/2022 PET scan   1. Enlarging RIGHT inguinal lymph node suspicious for metastatic disease. This shows only mild to moderate increased metabolic activity but displays FDG uptake greater than blood pool and shows abnormal morphology. Tissue sampling may be helpful. 2. No additional signs of disease in the neck, chest, abdomen or pelvis. 3. Signs of pelvic floor dysfunction and probable cystocele. 4. Chronic fracture of RIGHT inferior pubic ramus signs of healing since previous imaging. 5. Aortic atherosclerosis. 6. Moderate to large hiatal hernia.   Aortic Atherosclerosis (ICD10-I70.0).   09/04/2022 Pathology Results   FINAL MICROSCOPIC DIAGNOSIS:   A. LYMPH NODE, RIGHT INGUINAL, BIOPSY:  Metastatic melanoma.  See comment.   COMMENT:  The tumor cells are loosely cohesive with foci of cytoplasmic pigment. Immunohistochemistry is positive with Melan-A, S100 and SOX10.  The morphology and immunophenotype are consistent with metastatic melanoma.      09/27/2022 Surgery   Surgery: Excision of enlarged right inguinal lymph node   Operative findings: 2 cm palpable but mobile right inguinal lymph node, hyperpigmented. No other palpable adenopathy.   11/13/2022 -  Chemotherapy   Patient is on Treatment Plan : MELANOMA Nivolumab (480) q28d       PHYSICAL EXAMINATION: ECOG PERFORMANCE STATUS: 0 - Asymptomatic  Vitals:   12/11/22 0906  BP: 125/67  Pulse: 80  Resp: 18  Temp: 97.6 F (36.4 C)  SpO2: 98%   Filed Weights   12/11/22 0906  Weight: 184 lb 12.8 oz (83.8 kg)    GENERAL:alert, no distress and comfortable  NEURO: alert & oriented x 3 with fluent speech, no focal motor/sensory deficits  LABORATORY DATA:  I have reviewed the data as listed    Component Value Date/Time   NA 141 12/11/2022 0850   K 3.3  (L) 12/11/2022 0850   CL 103 12/11/2022 0850   CO2 29 12/11/2022 0850   GLUCOSE 92 12/11/2022 0850   BUN 14 12/11/2022 0850   CREATININE 0.55 12/11/2022 0850   CREATININE 0.82 08/23/2020 1048   CALCIUM 9.9 12/11/2022 0850   PROT 7.3 12/11/2022 0850   ALBUMIN 4.4 12/11/2022 0850   AST 14 (L) 12/11/2022 0850   ALT 11 12/11/2022 0850   ALKPHOS 56 12/11/2022 0850   BILITOT 1.0 12/11/2022 0850   GFRNONAA >60 12/11/2022 0850   GFRNONAA 77 08/23/2020 1048   GFRAA 89 08/23/2020 1048    No results found for: "SPEP", "UPEP"  Lab Results  Component Value Date   WBC 4.9 12/11/2022   NEUTROABS 3.0 12/11/2022   HGB 13.0 12/11/2022   HCT 38.9 12/11/2022   MCV 91.1 12/11/2022   PLT 284 12/11/2022      Chemistry      Component Value Date/Time   NA 141 12/11/2022 0850   K 3.3 (L) 12/11/2022 0850   CL 103 12/11/2022 0850   CO2 29 12/11/2022 0850   BUN 14 12/11/2022 0850   CREATININE 0.55 12/11/2022 0850   CREATININE 0.82 08/23/2020 1048      Component Value Date/Time   CALCIUM 9.9 12/11/2022 0850   ALKPHOS 56 12/11/2022 0850  AST 14 (L) 12/11/2022 0850   ALT 11 12/11/2022 0850   BILITOT 1.0 12/11/2022 0850       RADIOGRAPHIC STUDIES: I have personally reviewed the radiological images as listed and agreed with the findings in the report. DG Bone Density  Result Date: 11/24/2022 EXAM: DUAL X-RAY ABSORPTIOMETRY (DXA) FOR BONE MINERAL DENSITY IMPRESSION: Referring Physician:  BETTY Rios [name] Your patient completed a bone mineral density test using GE Lunar iDXA system (analysis version: 16). Technologist: lmn PATIENT: Name: Ariel, Rios Patient ID: [id number] Birth Date: [birth year] Height: 61.6 in. Sex: Female Measured: 11/24/2022 Weight: 186.0 lbs. Indications: Caucasian, Estrogen Deficient, Long term steroid use, Polymyalgia Rheumatica, Postmenopausal, Glucocorticoids (Chronic) (255.41) Fractures: NONE Treatments: Multivitamin ASSESSMENT: The BMD measured at AP Spine L1-L4 is  0.935 Rios/cm2 with a T-score of -2.0. This patient is considered osteopenic/low bone mass according to [facility]) criteria. The quality of the exam is good. Site Region Measured Date Measured Age YA BMD Significant CHANGE T-score AP Spine  L1-L4      11[age]24/2023    65.2         -2.0    0.935 Rios/cm2 DualFemur Neck Left  11/24/2022    65.2         -1.6    0.818 Rios/cm2 DualFemur Total Mean 11/24/2022    65.2         -0.8    0.908 Rios/cm2 World Health Organization [facility]) criteria for post-menopausal, Caucasian Women: Normal       T-score at or above -1 SD Osteopenia   T-score between -1 and -2.5 SD Osteoporosis T-score at or below -2.5 SD RECOMMENDATION: 1. All patients should optimize calcium and vitamin D intake. 2. Consider FDA-approved medical therapies in postmenopausal women and men aged 48 years and older, based on the following: a. A hip or vertebral (clinical or morphometric) fracture. b. T-score = -2.5 at the femoral neck or spine after appropriate evaluation to exclude secondary causes. c. Low bone mass (T-score between -1.0 and -2.5 at the femoral neck or spine) and a 10-year probability of a hip fracture = 3% or a 10-year probability of a major osteoporosis-related fracture = 20% based on the [place]-adapted WHO algorithm. d. Clinician judgment and/or patient preferences may indicate treatment for people with 10-year fracture probabilities above or below these levels. FOLLOW-UP: Patients with diagnosis of osteoporosis or at high risk for fracture should have regular bone mineral density tests.? Patients eligible for Medicare are allowed routine testing every 2 years.? The testing frequency can be increased to one year for patients who have rapidly progressing disease, are receiving or discontinuing medical therapy to restore bone mass, or have additional risk factors. I have reviewed this study and agree with the findings. Mark A. Thornton Papas, M.D. [facility] FRAX* 10-year Probability of  Fracture Based on femoral neck BMD: DualFemur (Left) Major Osteoporotic Fracture: 13.6% Hip Fracture:                1.7% Population:                  [place] (Caucasian) Risk Factors:                Glucocorticoids (Chronic) (255.41) *FRAX is a Materials engineer of the [employer] for Metabolic Bone Disease, a [place] (WHO) [facility]. ASSESSMENT: The probability of a major osteoporotic fracture is 13.6% within the next ten years. The probability of a hip fracture is 1.7% within  the next ten years. I have reviewed this report and agree with the above findings. Mark A. Thornton Papas, M.D. [facility] Electronically Signed   By: Lavonia Dana M.D.   On: 11/24/2022 08:16

## 2022-12-11 NOTE — Patient Instructions (Signed)
[facility]  Discharge Instructions: Thank you for choosing [facility] to provide your oncology and hematology care.   If you have a lab appointment with the Cancer Center, please go directly to the Cancer Center and check in at the registration area.   Wear comfortable clothing and clothing appropriate for easy access to any Portacath or PICC line.   We strive to give you quality time with your provider. You may need to reschedule your appointment if you arrive late (15 or more minutes).  Arriving late affects you and other patients whose appointments are after yours.  Also, if you miss three or more appointments without notifying the office, you may be dismissed from the clinic at the provider's discretion.      For prescription refill requests, have your pharmacy contact our office and allow 72 hours for refills to be completed.    Today you received the following chemotherapy and/or immunotherapy agents: Opdivo      To help prevent nausea and vomiting after your treatment, we encourage you to take your nausea medication as directed.  BELOW ARE SYMPTOMS THAT SHOULD BE REPORTED IMMEDIATELY: *FEVER GREATER THAN 100.4 F (38 C) OR HIGHER *CHILLS OR SWEATING *NAUSEA AND VOMITING THAT IS NOT CONTROLLED WITH YOUR NAUSEA MEDICATION *UNUSUAL SHORTNESS OF BREATH *UNUSUAL BRUISING OR BLEEDING *URINARY PROBLEMS (pain or burning when urinating, or frequent urination) *BOWEL PROBLEMS (unusual diarrhea, constipation, pain near the anus) TENDERNESS IN MOUTH AND THROAT WITH OR WITHOUT PRESENCE OF ULCERS (sore throat, sores in mouth, or a toothache) UNUSUAL RASH, SWELLING OR PAIN  UNUSUAL VAGINAL DISCHARGE OR ITCHING   Items with * indicate a potential emergency and should be followed up as soon as possible or go to the Emergency Department if any problems should occur.  Please show the CHEMOTHERAPY ALERT CARD or IMMUNOTHERAPY ALERT CARD at check-in  to the Emergency Department and triage nurse.  Should you have questions after your visit or need to cancel or reschedule your appointment, please contact [facility]  Dept: [phone]  and follow the prompts.  Office hours are 8:00 a.m. to 4:30 p.m. Monday - Friday. Please note that voicemails left after 4:00 p.m. may not be returned until the following business day.  We are closed weekends and major holidays. You have access to a nurse at all times for urgent questions. Please call the main number to the clinic Dept: [phone] and follow the prompts.   For any non-urgent questions, you may also contact your provider using MyChart. We now offer e-Visits for anyone 18 and older to request care online for non-urgent symptoms. For details visit [email or web address].   Also download the MyChart app! Go to the app store, search "MyChart", open the app, select [facility], and log in with your MyChart username and password.   

## 2022-12-11 NOTE — Assessment & Plan Note (Signed)
So far, she tolerated immunotherapy well without major exacerbation of her joint pain We will proceed with treatment without delay

## 2022-12-12 ENCOUNTER — Ambulatory Visit
Admission: RE | Admit: 2022-12-12 | Discharge: 2022-12-12 | Disposition: A | Discharge status: Home / Self Care | Payer: Medicare HMO | Source: Ambulatory Visit | Attending: Family Medicine | Admitting: Family Medicine

## 2022-12-12 DIAGNOSIS — Z1231 Encounter for screening mammogram for malignant neoplasm of breast: Secondary | ICD-10-CM

## 2022-12-26 ENCOUNTER — Other Ambulatory Visit: Payer: Self-pay

## 2022-12-28 ENCOUNTER — Encounter: Payer: Self-pay | Admitting: Hematology and Oncology

## 2023-01-01 ENCOUNTER — Ambulatory Visit (HOSPITAL_COMMUNITY)
Admission: RE | Admit: 2023-01-01 | Discharge: 2023-01-01 | Disposition: A | Discharge status: Home / Self Care | Payer: Medicare HMO | Source: Ambulatory Visit | Attending: Gynecologic Oncology | Admitting: Gynecologic Oncology

## 2023-01-01 DIAGNOSIS — C519 Malignant neoplasm of vulva, unspecified: Secondary | ICD-10-CM | POA: Insufficient documentation

## 2023-01-01 MED ORDER — IOHEXOL 300 MG/ML  SOLN
100.0000 mL | Freq: Once | INTRAMUSCULAR | Status: AC | PRN
  Administered 2023-01-01: 100 mL via INTRAVENOUS

## 2023-01-01 MED ORDER — IOHEXOL 9 MG/ML PO SOLN
500.0000 mL | ORAL | Status: AC
  Administered 2023-01-01 (×2): 500 mL via ORAL

## 2023-01-01 MED ORDER — IOHEXOL 9 MG/ML PO SOLN
ORAL | Status: AC
  Filled 2023-01-01: qty 1000

## 2023-01-04 ENCOUNTER — Telehealth: Payer: Self-pay | Admitting: Gynecologic Oncology

## 2023-01-04 NOTE — Telephone Encounter (Signed)
I called the patient to discuss recent scan.  Discussed mildly enlarged lymph node in the groin, just adjacent to recently resected lymph node with metastatic adenoma.  She is so far tolerating immunotherapy well.  I reviewed scan results with Dr. Bertis Ruddy.  We both recommend continuing on immunotherapy and reassessing with scan in 3 months.  Eugene Garnet MD Gynecologic Oncology

## 2023-01-08 ENCOUNTER — Inpatient Hospital Stay (HOSPITAL_BASED_OUTPATIENT_CLINIC_OR_DEPARTMENT_OTHER): Payer: Medicare HMO | Admitting: Hematology and Oncology

## 2023-01-08 ENCOUNTER — Inpatient Hospital Stay: Payer: Medicare HMO | Attending: Gynecologic Oncology

## 2023-01-08 ENCOUNTER — Encounter: Payer: Self-pay | Admitting: Hematology and Oncology

## 2023-01-08 ENCOUNTER — Inpatient Hospital Stay: Payer: Medicare HMO

## 2023-01-08 VITALS — BP 123/76 | HR 74 | Temp 97.5°F | Resp 18 | Ht 62.0 in | Wt 182.6 lb

## 2023-01-08 DIAGNOSIS — M353 Polymyalgia rheumatica: Secondary | ICD-10-CM

## 2023-01-08 DIAGNOSIS — Z5112 Encounter for antineoplastic immunotherapy: Secondary | ICD-10-CM | POA: Diagnosis present

## 2023-01-08 DIAGNOSIS — C519 Malignant neoplasm of vulva, unspecified: Secondary | ICD-10-CM | POA: Insufficient documentation

## 2023-01-08 DIAGNOSIS — Z79620 Long term (current) use of immunosuppressive biologic: Secondary | ICD-10-CM | POA: Diagnosis not present

## 2023-01-08 LAB — CBC WITH DIFFERENTIAL (CANCER CENTER ONLY)
Abs Immature Granulocytes: 0.01 10*3/uL (ref 0.00–0.07)
Basophils Absolute: 0 10*3/uL (ref 0.0–0.1)
Basophils Relative: 1 %
Eosinophils Absolute: 0.1 10*3/uL (ref 0.0–0.5)
Eosinophils Relative: 3 %
HCT: 36.6 % (ref 36.0–46.0)
Hemoglobin: 12.4 g/dL (ref 12.0–15.0)
Immature Granulocytes: 0 %
Lymphocytes Relative: 30 %
Lymphs Abs: 1.3 10*3/uL (ref 0.7–4.0)
MCH: 30.5 pg (ref 26.0–34.0)
MCHC: 33.9 g/dL (ref 30.0–36.0)
MCV: 90.1 fL (ref 80.0–100.0)
Monocytes Absolute: 0.4 10*3/uL (ref 0.1–1.0)
Monocytes Relative: 8 %
Neutro Abs: 2.5 10*3/uL (ref 1.7–7.7)
Neutrophils Relative %: 58 %
Platelet Count: 293 10*3/uL (ref 150–400)
RBC: 4.06 MIL/uL (ref 3.87–5.11)
RDW: 12.6 % (ref 11.5–15.5)
WBC Count: 4.3 10*3/uL (ref 4.0–10.5)
nRBC: 0 % (ref 0.0–0.2)

## 2023-01-08 LAB — CMP (CANCER CENTER ONLY)
ALT: 9 U/L (ref 0–44)
AST: 13 U/L — ABNORMAL LOW (ref 15–41)
Albumin: 4.2 g/dL (ref 3.5–5.0)
Alkaline Phosphatase: 58 U/L (ref 38–126)
Anion gap: 9 (ref 5–15)
BUN: 14 mg/dL (ref 8–23)
CO2: 27 mmol/L (ref 22–32)
Calcium: 9.9 mg/dL (ref 8.9–10.3)
Chloride: 106 mmol/L (ref 98–111)
Creatinine: 0.56 mg/dL (ref 0.44–1.00)
GFR, Estimated: >60 mL/min (ref >60)
Glucose, Bld: 94 mg/dL (ref 70–99)
Potassium: 3.5 mmol/L (ref 3.5–5.1)
Sodium: 142 mmol/L (ref 135–145)
Total Bilirubin: 0.9 mg/dL (ref 0.3–1.2)
Total Protein: 7.1 g/dL (ref 6.5–8.1)

## 2023-01-08 LAB — TSH: TSH: 5.038 u[IU]/mL — ABNORMAL HIGH (ref 0.350–4.500)

## 2023-01-08 MED ORDER — SODIUM CHLORIDE 0.9 % IV SOLN
480.0000 mg | Freq: Once | INTRAVENOUS | Status: AC
  Administered 2023-01-08: 480 mg via INTRAVENOUS
  Filled 2023-01-08: qty 48

## 2023-01-08 MED ORDER — SODIUM CHLORIDE 0.9 % IV SOLN: Freq: Once | INTRAVENOUS | Status: AC

## 2023-01-08 NOTE — Assessment & Plan Note (Signed)
I reviewed recent CT imaging results She is not symptomatic We discussed risk and benefits of continuing treatment with close observation for few more cycles versus biopsy Ultimately, after long discussion, she is in agreement to proceed with treatment and repeat imaging study in a few months I educated the patient signs and symptoms to watch out for disease progression such as pain, leg swelling and others

## 2023-01-08 NOTE — Progress Notes (Signed)
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[facility]) I reviewed recent CT imaging results She is not symptomatic We discussed risk and benefits of continuing treatment with close observation for few more cycles versus biopsy Ultimately, after long discussion, she is in agreement to proceed with treatment and repeat imaging study in a few months I educated the patient signs and symptoms to watch out for disease progression such as pain, leg swelling and others  PMR (polymyalgia rheumatica) (HCC) She has no recent flare of pain She will continue acetaminophen as needed  No orders of the defined types were placed in this encounter.   All questions were answered. The patient knows to call the clinic with any problems, questions or concerns. The total time spent in the appointment was 40 minutes encounter with patients including review of chart and various tests results, discussions about plan of care and coordination of care plan   Artis Delay, MD 01/08/2023 11:49 AM  INTERVAL HISTORY: Please see below for problem oriented charting. she returns for treatment follow-up She complained of intermittent joint stiffness but no severe pain We spent majority of our time reviewing test results and discussed next plan of care  REVIEW OF SYSTEMS:   Constitutional: Denies fevers, chills or abnormal weight loss Eyes: Denies blurriness of vision Ears, nose, mouth, throat, and face: Denies mucositis or sore throat Respiratory: Denies cough, dyspnea or wheezes Cardiovascular: Denies palpitation, chest discomfort or lower extremity swelling Gastrointestinal:  Denies nausea, heartburn or change in bowel habits Skin: Denies abnormal skin rashes Lymphatics: Denies new lymphadenopathy or easy bruising Neurological:Denies numbness, tingling or new weaknesses Behavioral/Psych: Mood is  stable, no new changes  All other systems were reviewed with the patient and are negative.  I have reviewed the past medical history, past surgical history, social history and family history with the patient and they are unchanged from previous note.  ALLERGIES:  has No Known Allergies.  MEDICATIONS:  Current Outpatient Medications  Medication Sig Dispense Refill   acetaminophen (TYLENOL) 650 MG CR tablet Take 650 mg by mouth every 8 (eight) hours as needed for pain.     ammonium lactate (LAC-HYDRIN) 12 % lotion Apply 1 Application topically as needed.     calcium carbonate (TUMS - DOSED IN MG ELEMENTAL CALCIUM) 500 MG chewable tablet Chew 1-2 tablets by mouth daily as needed for indigestion or heartburn.     cholecalciferol (VITAMIN D3) 25 MCG (1000 UNIT) tablet Take 2,000 Units by mouth daily.     hydrochlorothiazide (HYDRODIURIL) 25 MG tablet TAKE 1 TABLET BY MOUTH DAILY AS NEEDED. (Patient taking differently: Take 25 mg by mouth daily.) 90 tablet 3   KLOR-CON M20 20 MEQ tablet TAKE 1 TABLET BY MOUTH EVERY DAY 90 tablet 1   Menthol-Methyl Salicylate (SALONPAS PAIN RELIEF PATCH EX) Apply 1 patch topically daily as needed (pain).     Multiple Vitamins-Minerals (MULTIVITAMIN WITH MINERALS) tablet Take 1 tablet by mouth daily.     No current facility-administered medications for this visit.   Facility-Administered Medications Ordered in Other Visits  Medication Dose Route Frequency Provider Last Rate Last Admin   nivolumab (OPDIVO) 480 mg in sodium chloride 0.9 % 100 mL chemo infusion  480 mg Intravenous Once Artis Delay, MD 296 mL/hr at 01/08/23 1136 480 mg at 01/08/23 1136    SUMMARY OF ONCOLOGIC HISTORY: Oncology History Overview Note  BRAF:  undetectable (negative)   Melanoma of vulva  10/26/2020 Initial Diagnosis   She was noted to have bloody discharge in her underwear.  She denies pain   12/07/2020 Initial Biopsy   FINAL MICROSCOPIC DIAGNOSIS:   A.   RIGHT VULVA, BIOPSY:  PRIMARY MALIGNANT MELANOMA OF VULVA, MELANOMA  TABLE BELOW  MALIGNANT MELANOMA (AJCC 8TH EDITION):  PROCEDURE: BIOPSY  SPECIMEN ANATOMIC SITE: RIGHT VULVA  BRESLOW'S DEPTH/MAXIMUM TUMOR THICKNESS: 5.05 MM  CLARK/ANATOMIC LEVEL* IV  MARGINS:  PERIPHERAL: INVOLVED  DEEP: FREE  ULCERATION: PRESENT  MITOTIC INDEX: 11/mm2  LYMPHO-VASCULAR INVASION: PRESENT*  NEUTROTROPISM: ABSENT  TUMOR-INFILTRATING LYMPHOCYTES: NON-BRISK  LYMPH NODES: N/A  PATHOLOGIC STAGE: PT4B NX MX  *VULVAR MELANOMAS MAY NOT HAVE THE SAME BEHAVIOR STAGE FOR STAGE AS SUPERFICIAL SPREADING CUTANEOUS MELANOMA, AND LANDMARKS SUCH AS BRESLOW'S DEPTH MAY NOT APPLY. NONETHELESS, THIS SHOULD BE TREATED AS A PT4B MELANOMA AND COMPLETE EXCISION, WITH POSSIBLE LYMPH NODE EVALUATION, UNDERTAKEN. SEE COMMENT FOR IHC PROFILE.    COMMENT:  Sections show confluent and nested growth of atypical melanocytes along vulvar epithelium in an in-situ melanoma growth pattern at the periphery, with a central large ulcerated nodule of similar atypical  melanocytes, consonant with primary vulvar melanoma. Notably, IHC was performed to evaluate the lineage and is diagnostic for melanoma. Lesional melanocytes are positive with S100, MelanA, and show a high ki-67 uptake, and are negative with CDX-2, synaptophysin, CK20, TTF-1, CK7, CD56, and lymphoid markers CD5, CD3, CD20 highlight only background lymphocytes.    12/17/2020 Initial Diagnosis   Melanoma of vulva (HCC)   12/17/2020 Cancer Staging   Staging form: Melanoma of the Skin, AJCC 8th Edition - Clinical stage from 12/17/2020: Stage III (rcT4b, cN2, cM0) - Signed by Artis Delay, MD on 10/13/2022 Stage prefix: Recurrence   12/30/2020 PET scan   1. The only potential hypermetabolic finding of note is a small flat focus of accentuated metabolic activity along the anterior vaginal vestibule/perineum, maximum SUV 6.5. Most common cause for this type of appearance would be a trace amount of incontinence  of urinary FDG, but given the proximity to the reported vulvar melanoma, this small focus is considered indeterminate. 2. Other imaging findings of potential clinical significance: Aortic Atherosclerosis (ICD10-I70.0). Small type 1 hiatal hernia. Palatine tonsilloliths. Colonic diverticulosis.   01/18/2021 Surgery   Preoperative Diagnosis: Vulvar melanoma  Postoperative Diagnosis: Same  Procedures performed: Wide radical vulvectomy, R inguinofemoral sentinel LN biopsy  Attending Surgeon: Eugene Garnet, MD  Fellow: Leticia Clas  Resident: Georgana Curio, MD; Sallee Provencal, MD  Anesthesia: General endotracheal  Findings:  1. Vulva with biopsy site changes at R labia minora adjacent to the clitoris. No visible tumor. 2. Inguinofemoral nodes not clinically enlarged or abnormal.   Specimens:  1. R inguinal sentinel LN, hot and blue 2. Portion of vulva 3. Superior deep margin    01/18/2021 Pathology Results   Only melanoma in situ seen in final resection, SLN negative   02/08/2021 - 03/22/2021 Chemotherapy         03/29/2021 Genetic Testing   Negative genetic testing on the Multi-cancer +RNA panel.  The report date is March 29, 2021.  The Multi-Gene Panel offered by [facility] includes sequencing and/or deletion duplication testing of the following 84 genes: AIP, ALK, APC, ATM, AXIN2,BAP1,  BARD1, BLM, BMPR1A, BRCA1, BRCA2, BRIP1, CASR, CDC73, CDH1, CDK4, CDKN1B, CDKN1C, CDKN2A (p14ARF), CDKN2A (p16INK4a), CEBPA, CHEK2, CTNNA1, DICER1, DIS3L2, EGFR (c.2369C>T, p.Thr790Met variant only), EPCAM (Deletion/duplication testing only), FH, FLCN, GATA2, GPC3, GREM1 (Promoter region deletion/duplication  testing only), HOXB13 (c.251G>A, p.Gly84Glu), HRAS, KIT, MAX, MEN1, MET, MITF (c.952G>A, p.Glu318Lys variant only), MLH1, MSH2, MSH3, MSH6, MUTYH, NBN, NF1, NF2, NTHL1, PALB2, PDGFRA, PHOX2B, PMS2, POLD1, POLE, POT1, PRKAR1A, PTCH1, PTEN, RAD50, RAD51C, RAD51D, RB1, RECQL4, RET, RUNX1,  SDHAF2, SDHA (sequence changes only), SDHB, SDHC, SDHD, SMAD4, SMARCA4, SMARCB1, SMARCE1, STK11, SUFU, TERC, TERT, TMEM127, TP53, TSC1, TSC2, VHL, WRN and WT1.     04/29/2021 PET scan   1. No vulvar mass or residual hypermetabolism. 2. No findings suspicious for metastatic disease.   11/28/2021 Pathology Results   Vulvar biopsy - fibrosis c/w scar. Small junctional nevus. No melanoma identified.   08/31/2022 PET scan   1. Enlarging RIGHT inguinal lymph node suspicious for metastatic disease. This shows only mild to moderate increased metabolic activity but displays FDG uptake greater than blood pool and shows abnormal morphology. Tissue sampling may be helpful. 2. No additional signs of disease in the neck, chest, abdomen or pelvis. 3. Signs of pelvic floor dysfunction and probable cystocele. 4. Chronic fracture of RIGHT inferior pubic ramus signs of healing since previous imaging. 5. Aortic atherosclerosis. 6. Moderate to large hiatal hernia.   Aortic Atherosclerosis (ICD10-I70.0).   09/04/2022 Pathology Results   FINAL MICROSCOPIC DIAGNOSIS:   A. LYMPH NODE, RIGHT INGUINAL, BIOPSY:  Metastatic melanoma.  See comment.   COMMENT:  The tumor cells are loosely cohesive with foci of cytoplasmic pigment. Immunohistochemistry is positive with Melan-A, S100 and SOX10.  The morphology and immunophenotype are consistent with metastatic melanoma.      09/27/2022 Surgery   Surgery: Excision of enlarged right inguinal lymph node   Operative findings: 2 cm palpable but mobile right inguinal lymph node, hyperpigmented. No other palpable adenopathy.   11/13/2022 -  Chemotherapy   Patient is on Treatment Plan : MELANOMA Nivolumab (480) q28d     01/03/2023 Imaging   1. Enlarged right inguinal lymph node measuring 1.1 cm in short axis, similar to prior PET-CT. 2. No other potential sites of metastatic disease noted elsewhere in the chest, abdomen or pelvis. 3. Moderate-sized hiatal hernia. 4.  Colonic diverticulosis without evidence of acute diverticulitis at this time. 5. Aortic atherosclerosis. 6. Additional incidental findings, as above.       PHYSICAL EXAMINATION: ECOG PERFORMANCE STATUS: 0 - Asymptomatic  Vitals:   01/08/23 0956  BP: 123/76  Pulse: 74  Resp: 18  Temp: (!) 97.5 F (36.4 C)  SpO2: 98%   Filed Weights   01/08/23 0956  Weight: 182 lb 9.6 oz (82.8 kg)    GENERAL:alert, no distress and comfortable SKIN: skin color, texture, turgor are normal, no rashes or significant lesions EYES: normal, Conjunctiva are pink and non-injected, sclera clear OROPHARYNX:no exudate, no erythema and lips, buccal mucosa, and tongue normal  NECK: supple, thyroid normal size, non-tender, without nodularity LYMPH:  no palpable lymphadenopathy in the cervical, axillary or inguinal LUNGS: clear to auscultation and percussion with normal breathing effort HEART: regular rate & rhythm and no murmurs and no lower extremity edema ABDOMEN:abdomen soft, non-tender and normal bowel sounds Musculoskeletal:no cyanosis of digits and no clubbing  NEURO: alert & oriented x 3 with fluent speech, no focal motor/sensory deficits  LABORATORY DATA:  I have reviewed the data as listed    Component Value Date/Time   NA 142 01/08/2023 0942   K 3.5 01/08/2023 0942   CL 106 01/08/2023 0942   CO2 27 01/08/2023 0942   GLUCOSE 94 01/08/2023 0942   BUN 14 01/08/2023 0942   CREATININE 0.56 01/08/2023  7829   CREATININE 0.82 08/23/2020 1048   CALCIUM 9.9 01/08/2023 0942   PROT 7.1 01/08/2023 0942   ALBUMIN 4.2 01/08/2023 0942   AST 13 (L) 01/08/2023 0942   ALT 9 01/08/2023 0942   ALKPHOS 58 01/08/2023 0942   BILITOT 0.9 01/08/2023 0942   GFRNONAA >60 01/08/2023 0942   GFRNONAA 77 08/23/2020 1048   GFRAA 89 08/23/2020 1048    No results found for: "SPEP", "UPEP"  Lab Results  Component Value Date   WBC 4.3 01/08/2023   NEUTROABS 2.5 01/08/2023   HGB 12.4 01/08/2023   HCT 36.6  01/08/2023   MCV 90.1 01/08/2023   PLT 293 01/08/2023      Chemistry      Component Value Date/Time   NA 142 01/08/2023 0942   K 3.5 01/08/2023 0942   CL 106 01/08/2023 0942   CO2 27 01/08/2023 0942   BUN 14 01/08/2023 0942   CREATININE 0.56 01/08/2023 0942   CREATININE 0.82 08/23/2020 1048      Component Value Date/Time   CALCIUM 9.9 01/08/2023 0942   ALKPHOS 58 01/08/2023 0942   AST 13 (L) 01/08/2023 0942   ALT 9 01/08/2023 0942   BILITOT 0.9 01/08/2023 0942       RADIOGRAPHIC STUDIES: I have reviewed imaging study with the patient I have personally reviewed the radiological images as listed and agreed with the findings in the report. CT CHEST ABDOMEN PELVIS W CONTRAST  Result Date: 01/03/2023 CLINICAL DATA:  67 year old female history of vulvar melanoma. Metastatic disease. Follow-up study. * Tracking Code: BO * EXAM: CT CHEST, ABDOMEN, AND PELVIS WITH CONTRAST TECHNIQUE: Multidetector CT imaging of the chest, abdomen and pelvis was performed following the standard protocol during bolus administration of intravenous contrast. RADIATION DOSE REDUCTION: This exam was performed according to the departmental dose-optimization program which includes automated exposure control, adjustment of the mA and/or kV according to patient size and/or use of iterative reconstruction technique. CONTRAST:  OMNIPAQUE IOHEXOL 300 MG/ML  SOLN COMPARISON:  CT of the chest, abdomen and pelvis 06/07/2022. PET-CT 08/31/2022. FINDINGS: CT CHEST FINDINGS Cardiovascular: Heart size is normal. There is no significant pericardial fluid, thickening or pericardial calcification. Aortic atherosclerosis. No definite coronary artery calcifications. Mediastinum/Nodes: No pathologically enlarged mediastinal or hilar lymph nodes. Moderate-sized hiatal hernia. No axillary lymphadenopathy. Lungs/Pleura: No suspicious appearing pulmonary nodules or masses are noted. No acute consolidative airspace disease. No pleural  effusions. Mild scarring in the lung bases bilaterally. Musculoskeletal: There are no aggressive appearing lytic or blastic lesions noted in the visualized portions of the skeleton. CT ABDOMEN PELVIS FINDINGS Hepatobiliary: Well-defined 4.2 x 2.2 cm low-attenuation lesion in segment 2 of the liver, compatible with a cyst, similar to the prior study. Other subcentimeter low-attenuation lesion in segment 4B of the liver, too small to definitively characterize, but also stable compared to the prior study, also likely a tiny cyst (no imaging follow-up recommended). No other suspicious appearing hepatic lesions. No intra or extrahepatic biliary ductal dilatation. Gallbladder is unremarkable in appearance. Pancreas: No pancreatic mass. No pancreatic ductal dilatation. No pancreatic or peripancreatic fluid collections or inflammatory changes. Spleen: Unremarkable. Adrenals/Urinary Tract: Bilateral kidneys and bilateral adrenal glands are normal in appearance. No hydroureteronephrosis. Urinary bladder is normal in appearance. Stomach/Bowel: Intra-abdominal portion of the stomach is unremarkable. There is no pathologic dilatation of small bowel or colon. Numerous colonic diverticuli are noted, without surrounding inflammatory changes to indicate an acute diverticulitis at this time. Normal appendix. Vascular/Lymphatic: Mild atherosclerosis of the abdominal  aorta and pelvic vasculature, without evidence of aneurysm or dissection. No lymphadenopathy noted in the abdomen. Surgical clips in the right groin related to interval lymph node dissection. There continues to be an enlarged lymph node in the right groin (axial image 110 of series 2) just medial to the right common femoral vein measuring 11 mm in short axis. No other lymphadenopathy is noted elsewhere in the pelvis. Reproductive: Uterus and ovaries are unremarkable in appearance. Other: No significant volume of ascites.  No pneumoperitoneum. Musculoskeletal: There are no  aggressive appearing lytic or blastic lesions noted in the visualized portions of the skeleton. IMPRESSION: 1. Enlarged right inguinal lymph node measuring 1.1 cm in short axis, similar to prior PET-CT. 2. No other potential sites of metastatic disease noted elsewhere in the chest, abdomen or pelvis. 3. Moderate-sized hiatal hernia. 4. Colonic diverticulosis without evidence of acute diverticulitis at this time. 5. Aortic atherosclerosis. 6. Additional incidental findings, as above. Electronically Signed   By: Trudie Reed M.D.   On: 01/03/2023 07:21   MM 3D SCREEN BREAST BILATERAL  Result Date: 12/13/2022 CLINICAL DATA:  Screening. EXAM: DIGITAL SCREENING BILATERAL MAMMOGRAM WITH TOMOSYNTHESIS AND CAD TECHNIQUE: Bilateral screening digital craniocaudal and mediolateral oblique mammograms were obtained. Bilateral screening digital breast tomosynthesis was performed. The images were evaluated with computer-aided detection. COMPARISON:  Previous exam(s). ACR Breast Density Category b: There are scattered areas of fibroglandular density. FINDINGS: There are no findings suspicious for malignancy. IMPRESSION: No mammographic evidence of malignancy. A result letter of this screening mammogram will be mailed directly to the patient. RECOMMENDATION: Screening mammogram in one year. (Code:[id number]-B-01Y) BI-RADS CATEGORY  1: Negative. Electronically Signed   By: Sherian Rein M.D.   On: 12/13/2022 12:28

## 2023-01-08 NOTE — Patient Instructions (Signed)
[facility]  Discharge Instructions: Thank you for choosing [facility] to provide your oncology and hematology care.   If you have a lab appointment with the Cancer Center, please go directly to the Cancer Center and check in at the registration area.   Wear comfortable clothing and clothing appropriate for easy access to any Portacath or PICC line.   We strive to give you quality time with your provider. You may need to reschedule your appointment if you arrive late (15 or more minutes).  Arriving late affects you and other patients whose appointments are after yours.  Also, if you miss three or more appointments without notifying the office, you may be dismissed from the clinic at the provider's discretion.      For prescription refill requests, have your pharmacy contact our office and allow 72 hours for refills to be completed.    Today you received the following chemotherapy and/or immunotherapy agents: Opdivo      To help prevent nausea and vomiting after your treatment, we encourage you to take your nausea medication as directed.  BELOW ARE SYMPTOMS THAT SHOULD BE REPORTED IMMEDIATELY: *FEVER GREATER THAN 100.4 F (38 C) OR HIGHER *CHILLS OR SWEATING *NAUSEA AND VOMITING THAT IS NOT CONTROLLED WITH YOUR NAUSEA MEDICATION *UNUSUAL SHORTNESS OF BREATH *UNUSUAL BRUISING OR BLEEDING *URINARY PROBLEMS (pain or burning when urinating, or frequent urination) *BOWEL PROBLEMS (unusual diarrhea, constipation, pain near the anus) TENDERNESS IN MOUTH AND THROAT WITH OR WITHOUT PRESENCE OF ULCERS (sore throat, sores in mouth, or a toothache) UNUSUAL RASH, SWELLING OR PAIN  UNUSUAL VAGINAL DISCHARGE OR ITCHING   Items with * indicate a potential emergency and should be followed up as soon as possible or go to the Emergency Department if any problems should occur.  Please show the CHEMOTHERAPY ALERT CARD or IMMUNOTHERAPY ALERT CARD at check-in  to the Emergency Department and triage nurse.  Should you have questions after your visit or need to cancel or reschedule your appointment, please contact [facility]  Dept: [phone]  and follow the prompts.  Office hours are 8:00 a.m. to 4:30 p.m. Monday - Friday. Please note that voicemails left after 4:00 p.m. may not be returned until the following business day.  We are closed weekends and major holidays. You have access to a nurse at all times for urgent questions. Please call the main number to the clinic Dept: [phone] and follow the prompts.   For any non-urgent questions, you may also contact your provider using MyChart. We now offer e-Visits for anyone 18 and older to request care online for non-urgent symptoms. For details visit [email or web address].   Also download the MyChart app! Go to the app store, search "MyChart", open the app, select [facility], and log in with your MyChart username and password.   

## 2023-01-08 NOTE — Assessment & Plan Note (Signed)
She has no recent flare of pain She will continue acetaminophen as needed 

## 2023-01-09 LAB — T4: T4, Total: 7.3 ug/dL (ref 4.5–12.0)

## 2023-02-01 ENCOUNTER — Encounter: Payer: Self-pay | Admitting: Gynecologic Oncology

## 2023-02-01 ENCOUNTER — Inpatient Hospital Stay: Payer: Medicare HMO | Attending: Gynecologic Oncology | Admitting: Gynecologic Oncology

## 2023-02-01 VITALS — BP 134/75 | HR 81 | Temp 98.8°F | Ht 62.99 in | Wt 176.6 lb

## 2023-02-01 DIAGNOSIS — C519 Malignant neoplasm of vulva, unspecified: Secondary | ICD-10-CM | POA: Diagnosis present

## 2023-02-01 DIAGNOSIS — Z5112 Encounter for antineoplastic immunotherapy: Secondary | ICD-10-CM | POA: Diagnosis present

## 2023-02-01 DIAGNOSIS — Z7189 Other specified counseling: Secondary | ICD-10-CM | POA: Diagnosis not present

## 2023-02-01 DIAGNOSIS — Z9079 Acquired absence of other genital organ(s): Secondary | ICD-10-CM | POA: Diagnosis not present

## 2023-02-01 DIAGNOSIS — R599 Enlarged lymph nodes, unspecified: Secondary | ICD-10-CM

## 2023-02-01 DIAGNOSIS — M353 Polymyalgia rheumatica: Secondary | ICD-10-CM | POA: Insufficient documentation

## 2023-02-01 DIAGNOSIS — C774 Secondary and unspecified malignant neoplasm of inguinal and lower limb lymph nodes: Secondary | ICD-10-CM | POA: Diagnosis not present

## 2023-02-01 NOTE — Progress Notes (Signed)
Gynecologic Oncology Return Clinic Visit  02/01/23  Reason for Visit: follow-up  Treatment History: Oncology History Overview Note  BRAF: undetectable (negative)   Melanoma of vulva (HCC)  10/26/2020 Initial Diagnosis   She was noted to have bloody discharge in her underwear.  She denies pain   12/07/2020 Initial Biopsy   FINAL MICROSCOPIC DIAGNOSIS:   A.   RIGHT VULVA, BIOPSY: PRIMARY MALIGNANT MELANOMA OF VULVA, MELANOMA  TABLE BELOW  MALIGNANT MELANOMA (AJCC 8TH EDITION):  PROCEDURE: BIOPSY  SPECIMEN ANATOMIC SITE: RIGHT VULVA  BRESLOW'S DEPTH/MAXIMUM TUMOR THICKNESS: 5.05 MM  CLARK/ANATOMIC LEVEL* IV  MARGINS:  PERIPHERAL: INVOLVED  DEEP: FREE  ULCERATION: PRESENT  MITOTIC INDEX: 11/mm2  LYMPHO-VASCULAR INVASION: PRESENT*  NEUTROTROPISM: ABSENT  TUMOR-INFILTRATING LYMPHOCYTES: NON-BRISK  LYMPH NODES: N/A  PATHOLOGIC STAGE: PT4B NX MX  *VULVAR MELANOMAS MAY NOT HAVE THE SAME BEHAVIOR STAGE FOR STAGE AS SUPERFICIAL SPREADING CUTANEOUS MELANOMA, AND LANDMARKS SUCH AS BRESLOW'S DEPTH MAY NOT APPLY. NONETHELESS, THIS SHOULD BE TREATED AS A PT4B MELANOMA AND COMPLETE EXCISION, WITH POSSIBLE LYMPH NODE EVALUATION, UNDERTAKEN. SEE COMMENT FOR IHC PROFILE.    COMMENT:  Sections show confluent and nested growth of atypical melanocytes along vulvar epithelium in an in-situ melanoma growth pattern at the periphery, with a central large ulcerated nodule of similar atypical  melanocytes, consonant with primary vulvar melanoma. Notably, IHC was performed to evaluate the lineage and is diagnostic for melanoma. Lesional melanocytes are positive with S100, MelanA, and show a high ki-67 uptake, and are negative with CDX-2, synaptophysin, CK20, TTF-1, CK7, CD56, and lymphoid markers CD5, CD3, CD20 highlight only background lymphocytes.    12/17/2020 Initial Diagnosis   Melanoma of vulva (HCC)   12/17/2020 Cancer Staging   Staging form: Melanoma of the Skin, AJCC 8th Edition - Clinical stage  from 12/17/2020: Stage III (rcT4b, cN2, cM0) - Signed by Artis Delay, MD on 10/13/2022 Stage prefix: Recurrence   12/30/2020 PET scan   1. The only potential hypermetabolic finding of note is a small flat focus of accentuated metabolic activity along the anterior vaginal vestibule/perineum, maximum SUV 6.5. Most common cause for this type of appearance would be a trace amount of incontinence of urinary FDG, but given the proximity to the reported vulvar melanoma, this small focus is considered indeterminate. 2. Other imaging findings of potential clinical significance: Aortic Atherosclerosis (ICD10-I70.0). Small type 1 hiatal hernia. Palatine tonsilloliths. Colonic diverticulosis.   01/18/2021 Surgery   Preoperative Diagnosis: Vulvar melanoma  Postoperative Diagnosis: Same  Procedures performed: Wide radical vulvectomy, R inguinofemoral sentinel LN biopsy  Attending Surgeon: Eugene Garnet, MD  Fellow: Leticia Clas  Resident: Georgana Curio, MD; Sallee Provencal, MD  Anesthesia: General endotracheal  Findings:  1. Vulva with biopsy site changes at R labia minora adjacent to the clitoris. No visible tumor. 2. Inguinofemoral nodes not clinically enlarged or abnormal.   Specimens:  1. R inguinal sentinel LN, hot and blue 2. Portion of vulva 3. Superior deep margin    01/18/2021 Pathology Results   Only melanoma in situ seen in final resection, SLN negative   02/08/2021 - 03/22/2021 Chemotherapy         03/29/2021 Genetic Testing   Negative genetic testing on the Multi-cancer +RNA panel.  The report date is March 29, 2021.  The Multi-Gene Panel offered by [facility] includes sequencing and/or deletion duplication testing of the following 84 genes: AIP, ALK, APC, ATM, AXIN2,BAP1,  BARD1, BLM, BMPR1A, BRCA1, BRCA2, BRIP1, CASR, CDC73, CDH1, CDK4, CDKN1B, CDKN1C, CDKN2A (p14ARF), CDKN2A (p16INK4a), CEBPA,  CHEK2, CTNNA1, DICER1, DIS3L2, EGFR (c.2369C>T, p.Thr790Met variant only), EPCAM  (Deletion/duplication testing only), FH, FLCN, GATA2, GPC3, GREM1 (Promoter region deletion/duplication testing only), HOXB13 (c.251G>A, p.Gly84Glu), HRAS, KIT, MAX, MEN1, MET, MITF (c.952G>A, p.Glu318Lys variant only), MLH1, MSH2, MSH3, MSH6, MUTYH, NBN, NF1, NF2, NTHL1, PALB2, PDGFRA, PHOX2B, PMS2, POLD1, POLE, POT1, PRKAR1A, PTCH1, PTEN, RAD50, RAD51C, RAD51D, RB1, RECQL4, RET, RUNX1, SDHAF2, SDHA (sequence changes only), SDHB, SDHC, SDHD, SMAD4, SMARCA4, SMARCB1, SMARCE1, STK11, SUFU, TERC, TERT, TMEM127, TP53, TSC1, TSC2, VHL, WRN and WT1.     04/29/2021 PET scan   1. No vulvar mass or residual hypermetabolism. 2. No findings suspicious for metastatic disease.   11/28/2021 Pathology Results   Vulvar biopsy - fibrosis c/w scar. Small junctional nevus. No melanoma identified.   08/31/2022 PET scan   1. Enlarging RIGHT inguinal lymph node suspicious for metastatic disease. This shows only mild to moderate increased metabolic activity but displays FDG uptake greater than blood pool and shows abnormal morphology. Tissue sampling may be helpful. 2. No additional signs of disease in the neck, chest, abdomen or pelvis. 3. Signs of pelvic floor dysfunction and probable cystocele. 4. Chronic fracture of RIGHT inferior pubic ramus signs of healing since previous imaging. 5. Aortic atherosclerosis. 6. Moderate to large hiatal hernia.   Aortic Atherosclerosis (ICD10-I70.0).   09/04/2022 Pathology Results   FINAL MICROSCOPIC DIAGNOSIS:   A. LYMPH NODE, RIGHT INGUINAL, BIOPSY:  Metastatic melanoma.  See comment.   COMMENT:  The tumor cells are loosely cohesive with foci of cytoplasmic pigment. Immunohistochemistry is positive with Melan-A, S100 and SOX10.  The morphology and immunophenotype are consistent with metastatic melanoma.      09/27/2022 Surgery   Surgery: Excision of enlarged right inguinal lymph node   Operative findings: 2 cm palpable but mobile right inguinal lymph node, hyperpigmented.  No other palpable adenopathy.   11/13/2022 -  Chemotherapy   Patient is on Treatment Plan : MELANOMA Nivolumab (480) q28d     01/03/2023 Imaging   1. Enlarged right inguinal lymph node measuring 1.1 cm in short axis, similar to prior PET-CT. 2. No other potential sites of metastatic disease noted elsewhere in the chest, abdomen or pelvis. 3. Moderate-sized hiatal hernia. 4. Colonic diverticulosis without evidence of acute diverticulitis at this time. 5. Aortic atherosclerosis. 6. Additional incidental findings, as above.       Interval History: Overall doing well.  Polymyalgia is significantly better than it was the last time she was on immunotherapy.  She is not having pain all over but does have significant joint pain.  She denies any nausea or emesis, reports a good appetite.  Endorses baseline bowel bladder function.  Feels that she is less positive than she used to be and is interested in counseling.  Saw dermatology last month.  Past Medical/Surgical History: Past Medical History:  Diagnosis Date   Anemia    Arthritis    BMI 34.0-34.9,adult    Cancer (HCC)    melanoma   Complication of anesthesia    hard to wake up after anesthesia   Family history of melanoma    History of kidney stones    Melanotic macule of vulvar labia    Migraine    Polymyalgia rheumatica (HCC)    Venous insufficiency     Past Surgical History:  Procedure Laterality Date   Cataract Right 05/22/2022   CATARACT EXTRACTION Left 06/05/2022   COLONOSCOPY     INGUINAL LYMPHADENECTOMY Right 09/27/2022   Procedure: RIGHT INGUINAL LYMPH NODE DEBULKING;  Surgeon:  Carver Fila, MD;  Location: [facility];  Service: Gynecology;  Laterality: Right;   VULVECTOMY  01/19/2021   done at [facility], had sentinel node biopsy also    Family History  Problem Relation Age of Onset   Diabetes Mother    AVM Father    Melanoma Father        multiple melanomas dx between 85-90   Cancer Father 57        tumor on kidney   Cancer Paternal Grandfather        laryngeal cancer   Cancer Daughter 41       appendiceal cancer   Breast cancer Neg Hx    Ovarian cancer Neg Hx    Colon cancer Neg Hx    Uterine cancer Neg Hx    Colon polyps Neg Hx    Esophageal cancer Neg Hx    Stomach cancer Neg Hx    Rectal cancer Neg Hx     Social History   Socioeconomic History   Marital status: Married    Spouse name: Not on file   Number of children: 2   Years of education: Not on file   Highest education level: 12th grade  Occupational History   Occupation: Estate manager/land agent   Occupation: stocking  Tobacco Use   Smoking status: Never   Smokeless tobacco: Never  Vaping Use   Vaping Use: Never used  Substance and Sexual Activity   Alcohol use: Yes    Comment: occasional   Drug use: No   Sexual activity: Not Currently  Other Topics Concern   Not on file  Social History Narrative   Not on file   Social Determinants of Health   Financial Resource Strain: Not on file  Food Insecurity: No Food Insecurity (10/03/2022)   Hunger Vital Sign    Worried About Running Out of Food in the Last Year: Never true    Ran Out of Food in the Last Year: Never true  Transportation Needs: No Transportation Needs (10/03/2022)   PRAPARE - Administrator, Civil Service (Medical): No    Lack of Transportation (Non-Medical): No  Physical Activity: Insufficiently Active (10/03/2022)   Exercise Vital Sign    Days of Exercise per Week: 7 days    Minutes of Exercise per Session: 20 min  Stress: No Stress Concern Present (10/03/2022)   [employer] of Occupational Health - Occupational Stress Questionnaire    Feeling of Stress : Not at all  Social Connections: Socially Isolated (10/03/2022)   Social Connection and Isolation Panel [NHANES]    Frequency of Communication with Friends and Family: Never    Frequency of Social Gatherings with Friends and Family: Once a week    Attends Religious Services: Never     Database administrator or Organizations: No    Attends Engineer, structural: Not on file    Marital Status: Married    Current Medications:  Current Outpatient Medications:    acetaminophen (TYLENOL) 650 MG CR tablet, Take 650 mg by mouth every 8 (eight) hours as needed for pain., Disp: , Rfl:    ammonium lactate (LAC-HYDRIN) 12 % lotion, Apply 1 Application topically as needed., Disp: , Rfl:    Bioflavonoid Products (BIOFLEX) TABS, Take 750 each by mouth daily. 2 tablets, Disp: , Rfl:    calcium carbonate (TUMS - DOSED IN MG ELEMENTAL CALCIUM) 500 MG chewable tablet, Chew 1-2 tablets by mouth daily as needed for indigestion or heartburn., Disp: ,  Rfl:    cholecalciferol (VITAMIN D3) 25 MCG (1000 UNIT) tablet, Take 2,000 Units by mouth daily., Disp: , Rfl:    hydrochlorothiazide (HYDRODIURIL) 25 MG tablet, TAKE 1 TABLET BY MOUTH DAILY AS NEEDED. (Patient taking differently: Take 25 mg by mouth daily.), Disp: 90 tablet, Rfl: 3   KLOR-CON M20 20 MEQ tablet, TAKE 1 TABLET BY MOUTH EVERY DAY, Disp: 90 tablet, Rfl: 1   Menthol-Methyl Salicylate (SALONPAS PAIN RELIEF PATCH EX), Apply 1 patch topically daily as needed (pain)., Disp: , Rfl:    Multiple Vitamins-Minerals (MULTIVITAMIN WITH MINERALS) tablet, Take 1 tablet by mouth daily., Disp: , Rfl:   Review of Systems: Denies appetite changes, fevers, chills, fatigue, unexplained weight changes. Denies hearing loss, neck lumps or masses, mouth sores, ringing in ears or voice changes. Denies cough or wheezing.  Denies shortness of breath. Denies chest pain or palpitations. Denies leg swelling. Denies abdominal distention, pain, blood in stools, constipation, diarrhea, nausea, vomiting, or early satiety. Denies pain with intercourse, dysuria, frequency, hematuria or incontinence. Denies hot flashes, pelvic pain, vaginal bleeding or vaginal discharge.   Denies joint pain, back pain or muscle pain/cramps. Denies itching, rash, or  wounds. Denies dizziness, headaches, numbness or seizures. Denies swollen lymph nodes or glands, denies easy bruising or bleeding. Denies anxiety, depression, confusion, or decreased concentration.  Physical Exam: BP 134/75 (BP Location: Left Arm)   Pulse 81   Temp 98.8 F (37.1 C) (Oral)   Ht 5' 2.99" (1.6 m)   Wt 176 lb 9.6 oz (80.1 kg)   SpO2 97%   BMI 31.29 kg/m  General: Alert, oriented, no acute distress. HEENT: Normocephalic, atraumatic, sclera anicteric. Chest: Clear to auscultation bilaterally.  No wheezes or rhonchi. Cardiovascular: Regular rate and rhythm, no murmurs. Abdomen: soft, nontender.  Normoactive bowel sounds.  No masses or hepatosplenomegaly appreciated.   Extremities: Grossly normal range of motion.  Warm, well perfused.  No edema bilaterally. Skin: No rashes or lesions noted. Lymphatics: Palpable, 1 cm right groin node, no other cervical, supraclavicular, or inguinal adenopathy. GU: External female genitalia notable for changes from prior surgery along the right vulva.  There is mild discoloration along the most inferior aspect of the incision, similar to what is has been.   No other areas of discoloration, lesions, or atypical vasculature.   No new lesions.  Rectal exam performed with no vaginal lesions noted, cervix normal in appearance.  Bimanual exam reveals no masses or tenderness.  Laboratory & Radiologic Studies: CT C/A/P 4/8: 1. Enlarged right inguinal lymph node measuring 1.1 cm in short axis, similar to prior PET-CT. 2. No other potential sites of metastatic disease noted elsewhere in the chest, abdomen or pelvis. 3. Moderate-sized hiatal hernia. 4. Colonic diverticulosis without evidence of acute diverticulitis at this time. 5. Aortic atherosclerosis. 6. Additional incidental findings, as above.  Assessment & Plan: Ariel Rios is a 67 y.o. woman with recurrent vulvar melanoma, treated initially with excision of inguinal lymph node, now  with new lymph node that is PET positive.  The patient is overall doing well, tolerating immunotherapy better than she did after her initial diagnosis when she was on pembrolizumab.  She received cycle 3 of nivolumab in mid April.  Recent CT scan in early April (after 2 cycles) showed stable enlarged right inguinal lymph node measuring 1.1 cm, no other evidence of metastatic disease.  Of note is palpable on exam today.  No other findings on the vulva or within the vagina to suggest recurrent disease.  I will plan to see the patient back for 87-month follow-up and exam.  20 minutes of total time was spent for this patient encounter, including preparation, face-to-face counseling with the patient and coordination of care, and documentation of the encounter.  Eugene Garnet, MD  Division of Gynecologic Oncology  Department of Obstetrics and Gynecology  [facility]

## 2023-02-01 NOTE — Patient Instructions (Signed)
It was good to see you today.    I will see you for follow-up in 3 months.  I am putting in a referral to our social work team to get a time set up for you to speak with one of them about your cancer diagnosis and some of the challenges with living with cancer.  As always, if you develop any new and concerning symptoms before your next visit, please call to see me sooner.

## 2023-02-02 ENCOUNTER — Other Ambulatory Visit: Payer: Self-pay

## 2023-02-05 ENCOUNTER — Other Ambulatory Visit: Payer: Self-pay

## 2023-02-05 ENCOUNTER — Inpatient Hospital Stay: Payer: Medicare HMO

## 2023-02-05 ENCOUNTER — Encounter: Payer: Self-pay | Admitting: Hematology and Oncology

## 2023-02-05 ENCOUNTER — Inpatient Hospital Stay: Payer: Medicare HMO | Admitting: Licensed Clinical Social Worker

## 2023-02-05 ENCOUNTER — Inpatient Hospital Stay (HOSPITAL_BASED_OUTPATIENT_CLINIC_OR_DEPARTMENT_OTHER): Payer: Medicare HMO | Admitting: Hematology and Oncology

## 2023-02-05 VITALS — BP 118/72 | HR 67 | Temp 98.0°F | Resp 16

## 2023-02-05 VITALS — BP 124/68 | HR 69 | Temp 97.7°F | Resp 18 | Ht 62.99 in | Wt 179.0 lb

## 2023-02-05 DIAGNOSIS — C519 Malignant neoplasm of vulva, unspecified: Secondary | ICD-10-CM

## 2023-02-05 DIAGNOSIS — Z5112 Encounter for antineoplastic immunotherapy: Secondary | ICD-10-CM | POA: Diagnosis not present

## 2023-02-05 DIAGNOSIS — M353 Polymyalgia rheumatica: Secondary | ICD-10-CM

## 2023-02-05 LAB — CBC WITH DIFFERENTIAL (CANCER CENTER ONLY)
Abs Immature Granulocytes: 0.01 10*3/uL (ref 0.00–0.07)
Basophils Absolute: 0 10*3/uL (ref 0.0–0.1)
Basophils Relative: 1 %
Eosinophils Absolute: 0.1 10*3/uL (ref 0.0–0.5)
Eosinophils Relative: 2 %
HCT: 34.3 % — ABNORMAL LOW (ref 36.0–46.0)
Hemoglobin: 11.6 g/dL — ABNORMAL LOW (ref 12.0–15.0)
Immature Granulocytes: 0 %
Lymphocytes Relative: 28 %
Lymphs Abs: 1.4 10*3/uL (ref 0.7–4.0)
MCH: 29.9 pg (ref 26.0–34.0)
MCHC: 33.8 g/dL (ref 30.0–36.0)
MCV: 88.4 fL (ref 80.0–100.0)
Monocytes Absolute: 0.4 10*3/uL (ref 0.1–1.0)
Monocytes Relative: 7 %
Neutro Abs: 3 10*3/uL (ref 1.7–7.7)
Neutrophils Relative %: 62 %
Platelet Count: 337 10*3/uL (ref 150–400)
RBC: 3.88 MIL/uL (ref 3.87–5.11)
RDW: 12.4 % (ref 11.5–15.5)
WBC Count: 4.9 10*3/uL (ref 4.0–10.5)
nRBC: 0 % (ref 0.0–0.2)

## 2023-02-05 LAB — CMP (CANCER CENTER ONLY)
ALT: 8 U/L (ref 0–44)
AST: 12 U/L — ABNORMAL LOW (ref 15–41)
Albumin: 4.1 g/dL (ref 3.5–5.0)
Alkaline Phosphatase: 60 U/L (ref 38–126)
Anion gap: 9 (ref 5–15)
BUN: 12 mg/dL (ref 8–23)
CO2: 28 mmol/L (ref 22–32)
Calcium: 9.5 mg/dL (ref 8.9–10.3)
Chloride: 104 mmol/L (ref 98–111)
Creatinine: 0.46 mg/dL (ref 0.44–1.00)
GFR, Estimated: >60 mL/min (ref >60)
Glucose, Bld: 99 mg/dL (ref 70–99)
Potassium: 3.7 mmol/L (ref 3.5–5.1)
Sodium: 141 mmol/L (ref 135–145)
Total Bilirubin: 0.8 mg/dL (ref 0.3–1.2)
Total Protein: 7 g/dL (ref 6.5–8.1)

## 2023-02-05 MED ORDER — SODIUM CHLORIDE 0.9 % IV SOLN: Freq: Once | INTRAVENOUS | Status: AC

## 2023-02-05 MED ORDER — SODIUM CHLORIDE 0.9 % IV SOLN
480.0000 mg | Freq: Once | INTRAVENOUS | Status: AC
  Administered 2023-02-05: 480 mg via INTRAVENOUS
  Filled 2023-02-05: qty 48

## 2023-02-05 NOTE — Patient Instructions (Signed)
[facility]  Discharge Instructions: Thank you for choosing [facility] to provide your oncology and hematology care.   If you have a lab appointment with the Cancer Center, please go directly to the Cancer Center and check in at the registration area.   Wear comfortable clothing and clothing appropriate for easy access to any Portacath or PICC line.   We strive to give you quality time with your provider. You may need to reschedule your appointment if you arrive late (15 or more minutes).  Arriving late affects you and other patients whose appointments are after yours.  Also, if you miss three or more appointments without notifying the office, you may be dismissed from the clinic at the provider's discretion.      For prescription refill requests, have your pharmacy contact our office and allow 72 hours for refills to be completed.    Today you received the following chemotherapy and/or immunotherapy agent: Nivolumab (Opdivo)    To help prevent nausea and vomiting after your treatment, we encourage you to take your nausea medication as directed.  BELOW ARE SYMPTOMS THAT SHOULD BE REPORTED IMMEDIATELY: *FEVER GREATER THAN 100.4 F (38 C) OR HIGHER *CHILLS OR SWEATING *NAUSEA AND VOMITING THAT IS NOT CONTROLLED WITH YOUR NAUSEA MEDICATION *UNUSUAL SHORTNESS OF BREATH *UNUSUAL BRUISING OR BLEEDING *URINARY PROBLEMS (pain or burning when urinating, or frequent urination) *BOWEL PROBLEMS (unusual diarrhea, constipation, pain near the anus) TENDERNESS IN MOUTH AND THROAT WITH OR WITHOUT PRESENCE OF ULCERS (sore throat, sores in mouth, or a toothache) UNUSUAL RASH, SWELLING OR PAIN  UNUSUAL VAGINAL DISCHARGE OR ITCHING   Items with * indicate a potential emergency and should be followed up as soon as possible or go to the Emergency Department if any problems should occur.  Please show the CHEMOTHERAPY ALERT CARD or IMMUNOTHERAPY ALERT CARD at  check-in to the Emergency Department and triage nurse.  Should you have questions after your visit or need to cancel or reschedule your appointment, please contact [facility]  Dept: [phone]  and follow the prompts.  Office hours are 8:00 a.m. to 4:30 p.m. Monday - Friday. Please note that voicemails left after 4:00 p.m. may not be returned until the following business day.  We are closed weekends and major holidays. You have access to a nurse at all times for urgent questions. Please call the main number to the clinic Dept: [phone] and follow the prompts.   For any non-urgent questions, you may also contact your provider using MyChart. We now offer e-Visits for anyone 18 and older to request care online for non-urgent symptoms. For details visit [email or web address].   Also download the MyChart app! Go to the app store, search "MyChart", open the app, select [place], and log in with your MyChart username and password.  Nivolumab Injection What is this medication? NIVOLUMAB (nye VOL ue mab) treats some types of cancer. It works by helping your immune system slow or stop the spread of cancer cells. It is a monoclonal antibody. This medicine may be used for other purposes; ask your health care provider or pharmacist if you have questions. COMMON BRAND NAME(S): Opdivo What should I tell my care team before I take this medication? They need to know if you have any of these conditions: Allogeneic stem cell transplant (uses someone else's stem cells) Autoimmune diseases, such as Crohn disease, ulcerative colitis, lupus History of chest radiation Nervous system problems,   such as Guillain-Barre syndrome or myasthenia gravis Organ transplant An unusual or allergic reaction to nivolumab, other medications, foods, dyes, or preservatives Pregnant or trying to get pregnant Breast-feeding How should I use this medication? This medication is infused  into a vein. It is given in a hospital or clinic setting. A special MedGuide will be given to you before each treatment. Be sure to read this information carefully each time. Talk to your care team about the use of this medication in children. While it may be prescribed for children as young as 12 years for selected conditions, precautions do apply. Overdosage: If you think you have taken too much of this medicine contact a poison control center or emergency room at once. NOTE: This medicine is only for you. Do not share this medicine with others. What if I miss a dose? Keep appointments for follow-up doses. It is important not to miss your dose. Call your care team if you are unable to keep an appointment. What may interact with this medication? Interactions have not been studied. This list may not describe all possible interactions. Give your health care provider a list of all the medicines, herbs, non-prescription drugs, or dietary supplements you use. Also tell them if you smoke, drink alcohol, or use illegal drugs. Some items may interact with your medicine. What should I watch for while using this medication? Your condition will be monitored carefully while you are receiving this medication. You may need blood work while taking this medication. This medication may cause serious skin reactions. They can happen weeks to months after starting the medication. Contact your care team right away if you notice fevers or flu-like symptoms with a rash. The rash may be red or purple and then turn into blisters or peeling of the skin. You may also notice a red rash with swelling of the face, lips, or lymph nodes in your neck or under your arms. Tell your care team right away if you have any change in your eyesight. Talk to your care team if you are pregnant or think you might be pregnant. A negative pregnancy test is required before starting this medication. A reliable form of contraception is recommended  while taking this medication and for 5 months after the last dose. Talk to your care team about effective forms of contraception. Do not breast-feed while taking this medication and for 5 months after the last dose. What side effects may I notice from receiving this medication? Side effects that you should report to your care team as soon as possible: Allergic reactions--skin rash, itching, hives, swelling of the face, lips, tongue, or throat Dry cough, shortness of breath or trouble breathing Eye pain, redness, irritation, or discharge with blurry or decreased vision Heart muscle inflammation--unusual weakness or fatigue, shortness of breath, chest pain, fast or irregular heartbeat, dizziness, swelling of the ankles, feet, or hands Hormone gland problems--headache, sensitivity to light, unusual weakness or fatigue, dizziness, fast or irregular heartbeat, increased sensitivity to cold or heat, excessive sweating, constipation, hair loss, increased thirst or amount of urine, tremors or shaking, irritability Infusion reactions--chest pain, shortness of breath or trouble breathing, feeling faint or lightheaded Kidney injury (glomerulonephritis)--decrease in the amount of urine, red or dark brown urine, foamy or bubbly urine, swelling of the ankles, hands, or feet Liver injury--right upper belly pain, loss of appetite, nausea, light-colored stool, dark yellow or brown urine, yellowing skin or eyes, unusual weakness or fatigue Pain, tingling, or numbness in the hands or feet, muscle   weakness, change in vision, confusion or trouble speaking, loss of balance or coordination, trouble walking, seizures Rash, fever, and swollen lymph nodes Redness, blistering, peeling, or loosening of the skin, including inside the mouth Sudden or severe stomach pain, bloody diarrhea, fever, nausea, vomiting Side effects that usually do not require medical attention (report these to your care team if they continue or are  bothersome): Bone, joint, or muscle pain Diarrhea Fatigue Loss of appetite Nausea Skin rash This list may not describe all possible side effects. Call your doctor for medical advice about side effects. You may report side effects to FDA at 1-800-FDA-1088. Where should I keep my medication? This medication is given in a hospital or clinic. It will not be stored at home. NOTE: This sheet is a summary. It may not cover all possible information. If you have questions about this medicine, talk to your doctor, pharmacist, or health care provider.  2023 Elsevier/Gold Standard (2014-08-03 00:00:00)    

## 2023-02-05 NOTE — Assessment & Plan Note (Signed)
I reviewed recent CT imaging results She is not symptomatic We will proceed with treatment as scheduled I plan to repeat imaging study in July for follow-up

## 2023-02-05 NOTE — Progress Notes (Signed)
[facility] Clinical Social Work  Clinical Social Work was referred by medical provider for assessment of psychosocial needs.  Clinical Social Worker met with patient in infusion to offer support and assess for needs.    Patient is dealing with recurrence of vulvar cancer. She is feeling much better today after seeing Dr. Bertis Ruddy and receiving confirmation that she is not at end-of -life and there are continued treatment options. Overall, she feels that she has not been as positive as normal, but is not suicidal or depressed. She continues to work 7 days a week (full time at [employer], part-time at [employer]) and has support from spouse, family, and friends. An additional stressor for pt is that she has polymalgia and the stiffness in her joints impacts her day-to-day significantly.  CSW reviewed options for support from social work/spiritual care on-site, referral to community counseling, peer mentor, or support group.  Pt is feeling better today, so will contact CSW and/or chaplain as needed.     Vonnetta Akey E Daun Rens, LCSW  Clinical Social Worker [facility]

## 2023-02-05 NOTE — Progress Notes (Signed)
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   All questions were answered. The patient knows to call the clinic with any problems, questions or concerns. The total time spent in the appointment was 20 minutes encounter with patients including review of chart and various tests results, discussions about plan of care and coordination of care plan   Artis Delay, MD 02/05/2023 12:10 PM  INTERVAL HISTORY: Please see below for problem oriented charting. she returns for treatment follow-up seen prior to treatment We discussed recent findings of imaging study and timing of next imaging She is not symptomatic She continues to take acetaminophen as needed for stiffness and joint pain  REVIEW OF SYSTEMS:   Constitutional: Denies fevers, chills or abnormal weight loss Eyes: Denies blurriness of vision Ears, nose, mouth, throat, and face: Denies mucositis or sore throat Respiratory: Denies cough, dyspnea or wheezes Cardiovascular: Denies palpitation, chest discomfort or lower extremity swelling Gastrointestinal:  Denies nausea, heartburn or change in bowel habits Skin: Denies abnormal skin rashes Lymphatics: Denies new lymphadenopathy or easy bruising Neurological:Denies numbness, tingling or new weaknesses Behavioral/Psych: Mood is stable, no new changes  All other systems were reviewed with the patient and are negative.  I have reviewed the past medical history, past surgical history, social history and family history with the patient and  they are unchanged from previous note.  ALLERGIES:  has No Known Allergies.  MEDICATIONS:  Current Outpatient Medications  Medication Sig Dispense Refill   acetaminophen (TYLENOL) 650 MG CR tablet Take 650 mg by mouth every 8 (eight) hours as needed for pain.     ammonium lactate (LAC-HYDRIN) 12 % lotion Apply 1 Application topically as needed.     Bioflavonoid Products (BIOFLEX) TABS Take 750 each by mouth daily. 2 tablets     calcium carbonate (TUMS - DOSED IN MG ELEMENTAL CALCIUM) 500 MG chewable tablet Chew 1-2 tablets by mouth daily as needed for indigestion or heartburn.     cholecalciferol (VITAMIN D3) 25 MCG (1000 UNIT) tablet Take 2,000 Units by mouth daily.     hydrochlorothiazide (HYDRODIURIL) 25 MG tablet TAKE 1 TABLET BY MOUTH DAILY AS NEEDED. (Patient taking differently: Take 25 mg by mouth daily.) 90 tablet 3   KLOR-CON M20 20 MEQ tablet TAKE 1 TABLET BY MOUTH EVERY DAY 90 tablet 1   Menthol-Methyl Salicylate (SALONPAS PAIN RELIEF PATCH EX) Apply 1 patch topically daily as needed (pain).     Multiple Vitamins-Minerals (MULTIVITAMIN WITH MINERALS) tablet Take 1 tablet by mouth daily.     No current facility-administered medications for this visit.   Facility-Administered Medications Ordered in Other Visits  Medication Dose Route Frequency Provider Last Rate Last Admin   0.9 %  sodium chloride infusion   Intravenous Once Satina Jerrell, MD       nivolumab (OPDIVO) 480 mg in sodium chloride 0.9 % 100 mL chemo infusion  480 mg Intravenous Once Artis Delay, MD        SUMMARY OF ONCOLOGIC HISTORY: Oncology History Overview Note  BRAF: undetectable (negative)  Melanoma of vulva (HCC)  10/26/2020 Initial Diagnosis   She was noted to have bloody discharge in her underwear.  She denies pain   12/07/2020 Initial Biopsy   FINAL MICROSCOPIC DIAGNOSIS:   A.   RIGHT VULVA, BIOPSY: PRIMARY MALIGNANT MELANOMA OF VULVA, MELANOMA  TABLE BELOW  MALIGNANT MELANOMA (AJCC 8TH EDITION):   PROCEDURE: BIOPSY  SPECIMEN ANATOMIC SITE: RIGHT VULVA  BRESLOW'S DEPTH/MAXIMUM TUMOR THICKNESS: 5.05 MM  CLARK/ANATOMIC LEVEL* IV  MARGINS:  PERIPHERAL: INVOLVED  DEEP: FREE  ULCERATION: PRESENT  MITOTIC INDEX: 11/mm2  LYMPHO-VASCULAR INVASION: PRESENT*  NEUTROTROPISM: ABSENT  TUMOR-INFILTRATING LYMPHOCYTES: NON-BRISK  LYMPH NODES: N/A  PATHOLOGIC STAGE: PT4B NX MX  *VULVAR MELANOMAS MAY NOT HAVE THE SAME BEHAVIOR STAGE FOR STAGE AS SUPERFICIAL SPREADING CUTANEOUS MELANOMA, AND LANDMARKS SUCH AS BRESLOW'S DEPTH MAY NOT APPLY. NONETHELESS, THIS SHOULD BE TREATED AS A PT4B MELANOMA AND COMPLETE EXCISION, WITH POSSIBLE LYMPH NODE EVALUATION, UNDERTAKEN. SEE COMMENT FOR IHC PROFILE.    COMMENT:  Sections show confluent and nested growth of atypical melanocytes along vulvar epithelium in an in-situ melanoma growth pattern at the periphery, with a central large ulcerated nodule of similar atypical  melanocytes, consonant with primary vulvar melanoma. Notably, IHC was performed to evaluate the lineage and is diagnostic for melanoma. Lesional melanocytes are positive with S100, MelanA, and show a high ki-67 uptake, and are negative with CDX-2, synaptophysin, CK20, TTF-1, CK7, CD56, and lymphoid markers CD5, CD3, CD20 highlight only background lymphocytes.    12/17/2020 Initial Diagnosis   Melanoma of vulva (HCC)   12/17/2020 Cancer Staging   Staging form: Melanoma of the Skin, AJCC 8th Edition - Clinical stage from 12/17/2020: Stage III (rcT4b, cN2, cM0) - Signed by Artis Delay, MD on 10/13/2022 Stage prefix: Recurrence   12/30/2020 PET scan   1. The only potential hypermetabolic finding of note is a small flat focus of accentuated metabolic activity along the anterior vaginal vestibule/perineum, maximum SUV 6.5. Most common cause for this type of appearance would be a trace amount of incontinence of urinary FDG, but given the proximity to the reported vulvar melanoma, this small focus is  considered indeterminate. 2. Other imaging findings of potential clinical significance: Aortic Atherosclerosis (ICD10-I70.0). Small type 1 hiatal hernia. Palatine tonsilloliths. Colonic diverticulosis.   01/18/2021 Surgery   Preoperative Diagnosis: Vulvar melanoma  Postoperative Diagnosis: Same  Procedures performed: Wide radical vulvectomy, R inguinofemoral sentinel LN biopsy  Attending Surgeon: Eugene Garnet, MD  Fellow: Leticia Clas  Resident: Georgana Curio, MD; Sallee Provencal, MD  Anesthesia: General endotracheal  Findings:  1. Vulva with biopsy site changes at R labia minora adjacent to the clitoris. No visible tumor. 2. Inguinofemoral nodes not clinically enlarged or abnormal.   Specimens:  1. R inguinal sentinel LN, hot and blue 2. Portion of vulva 3. Superior deep margin    01/18/2021 Pathology Results   Only melanoma in situ seen in final resection, SLN negative   02/08/2021 - 03/22/2021 Chemotherapy         03/29/2021 Genetic Testing   Negative genetic testing on the Multi-cancer +RNA panel.  The report date is March 29, 2021.  The Multi-Gene Panel offered by [facility] includes sequencing and/or deletion duplication testing of the following 84 genes: AIP, ALK, APC, ATM, AXIN2,BAP1,  BARD1, BLM, BMPR1A, BRCA1, BRCA2, BRIP1, CASR, CDC73, CDH1, CDK4, CDKN1B, CDKN1C, CDKN2A (p14ARF), CDKN2A (p16INK4a), CEBPA, CHEK2, CTNNA1, DICER1, DIS3L2, EGFR (c.2369C>T, p.Thr790Met variant only), EPCAM (Deletion/duplication testing only), FH, FLCN, GATA2, GPC3, GREM1 (Promoter region deletion/duplication testing only), HOXB13 (  c.251G>A, p.Gly84Glu), HRAS, KIT, MAX, MEN1, MET, MITF (c.952G>A, p.Glu318Lys variant only), MLH1, MSH2, MSH3, MSH6, MUTYH, NBN, NF1, NF2, NTHL1, PALB2, PDGFRA, PHOX2B, PMS2, POLD1, POLE, POT1, PRKAR1A, PTCH1, PTEN, RAD50, RAD51C, RAD51D, RB1, RECQL4, RET, RUNX1, SDHAF2, SDHA (sequence changes only), SDHB, SDHC, SDHD, SMAD4, SMARCA4, SMARCB1, SMARCE1, STK11,  SUFU, TERC, TERT, TMEM127, TP53, TSC1, TSC2, VHL, WRN and WT1.     04/29/2021 PET scan   1. No vulvar mass or residual hypermetabolism. 2. No findings suspicious for metastatic disease.   11/28/2021 Pathology Results   Vulvar biopsy - fibrosis c/w scar. Small junctional nevus. No melanoma identified.   08/31/2022 PET scan   1. Enlarging RIGHT inguinal lymph node suspicious for metastatic disease. This shows only mild to moderate increased metabolic activity but displays FDG uptake greater than blood pool and shows abnormal morphology. Tissue sampling may be helpful. 2. No additional signs of disease in the neck, chest, abdomen or pelvis. 3. Signs of pelvic floor dysfunction and probable cystocele. 4. Chronic fracture of RIGHT inferior pubic ramus signs of healing since previous imaging. 5. Aortic atherosclerosis. 6. Moderate to large hiatal hernia.   Aortic Atherosclerosis (ICD10-I70.0).   09/04/2022 Pathology Results   FINAL MICROSCOPIC DIAGNOSIS:   A. LYMPH NODE, RIGHT INGUINAL, BIOPSY:  Metastatic melanoma.  See comment.   COMMENT:  The tumor cells are loosely cohesive with foci of cytoplasmic pigment. Immunohistochemistry is positive with Melan-A, S100 and SOX10.  The morphology and immunophenotype are consistent with metastatic melanoma.      09/27/2022 Surgery   Surgery: Excision of enlarged right inguinal lymph node   Operative findings: 2 cm palpable but mobile right inguinal lymph node, hyperpigmented. No other palpable adenopathy.   11/13/2022 -  Chemotherapy   Patient is on Treatment Plan : MELANOMA Nivolumab (480) q28d     01/03/2023 Imaging   1. Enlarged right inguinal lymph node measuring 1.1 cm in short axis, similar to prior PET-CT. 2. No other potential sites of metastatic disease noted elsewhere in the chest, abdomen or pelvis. 3. Moderate-sized hiatal hernia. 4. Colonic diverticulosis without evidence of acute diverticulitis at this time. 5. Aortic  atherosclerosis. 6. Additional incidental findings, as above.       PHYSICAL EXAMINATION: ECOG PERFORMANCE STATUS: 1 - Symptomatic but completely ambulatory  Vitals:   02/05/23 1137  BP: 124/68  Pulse: 69  Resp: 18  Temp: 97.7 F (36.5 C)  SpO2: 99%   Filed Weights   02/05/23 1137  Weight: 179 lb (81.2 kg)    GENERAL:alert, no distress and comfortable NEURO: alert & oriented x 3 with fluent speech, no focal motor/sensory deficits  LABORATORY DATA:  I have reviewed the data as listed    Component Value Date/Time   NA 141 02/05/2023 1049   K 3.7 02/05/2023 1049   CL 104 02/05/2023 1049   CO2 28 02/05/2023 1049   GLUCOSE 99 02/05/2023 1049   BUN 12 02/05/2023 1049   CREATININE 0.46 02/05/2023 1049   CREATININE 0.82 08/23/2020 1048   CALCIUM 9.5 02/05/2023 1049   PROT 7.0 02/05/2023 1049   ALBUMIN 4.1 02/05/2023 1049   AST 12 (L) 02/05/2023 1049   ALT 8 02/05/2023 1049   ALKPHOS 60 02/05/2023 1049   BILITOT 0.8 02/05/2023 1049   GFRNONAA >60 02/05/2023 1049   GFRNONAA 77 08/23/2020 1048   GFRAA 89 08/23/2020 1048    No results found for: "SPEP", "UPEP"  Lab Results  Component Value Date   WBC 4.9 02/05/2023   NEUTROABS 3.0  02/05/2023   HGB 11.6 (L) 02/05/2023   HCT 34.3 (L) 02/05/2023   MCV 88.4 02/05/2023   PLT 337 02/05/2023      Chemistry      Component Value Date/Time   NA 141 02/05/2023 1049   K 3.7 02/05/2023 1049   CL 104 02/05/2023 1049   CO2 28 02/05/2023 1049   BUN 12 02/05/2023 1049   CREATININE 0.46 02/05/2023 1049   CREATININE 0.82 08/23/2020 1048      Component Value Date/Time   CALCIUM 9.5 02/05/2023 1049   ALKPHOS 60 02/05/2023 1049   AST 12 (L) 02/05/2023 1049   ALT 8 02/05/2023 1049   BILITOT 0.8 02/05/2023 1049

## 2023-02-05 NOTE — Assessment & Plan Note (Signed)
She has no recent flare of pain She will continue acetaminophen as needed 

## 2023-03-02 ENCOUNTER — Other Ambulatory Visit: Payer: Self-pay | Admitting: Family Medicine

## 2023-03-02 DIAGNOSIS — E876 Hypokalemia: Secondary | ICD-10-CM

## 2023-03-05 ENCOUNTER — Inpatient Hospital Stay: Payer: Medicare HMO | Admitting: Hematology and Oncology

## 2023-03-05 ENCOUNTER — Inpatient Hospital Stay: Payer: Medicare HMO

## 2023-03-05 ENCOUNTER — Inpatient Hospital Stay: Payer: Medicare HMO | Attending: Gynecologic Oncology

## 2023-03-05 ENCOUNTER — Encounter: Payer: Self-pay | Admitting: Hematology and Oncology

## 2023-03-05 ENCOUNTER — Other Ambulatory Visit: Payer: Self-pay

## 2023-03-05 VITALS — BP 127/67 | HR 70 | Temp 97.8°F | Resp 18 | Ht 62.0 in | Wt 179.0 lb

## 2023-03-05 DIAGNOSIS — C519 Malignant neoplasm of vulva, unspecified: Secondary | ICD-10-CM

## 2023-03-05 DIAGNOSIS — Z5112 Encounter for antineoplastic immunotherapy: Secondary | ICD-10-CM | POA: Insufficient documentation

## 2023-03-05 DIAGNOSIS — M353 Polymyalgia rheumatica: Secondary | ICD-10-CM | POA: Diagnosis not present

## 2023-03-05 LAB — CBC WITH DIFFERENTIAL (CANCER CENTER ONLY)
Abs Immature Granulocytes: 0.01 10*3/uL (ref 0.00–0.07)
Basophils Absolute: 0 10*3/uL (ref 0.0–0.1)
Basophils Relative: 1 %
Eosinophils Absolute: 0.2 10*3/uL (ref 0.0–0.5)
Eosinophils Relative: 3 %
HCT: 34.3 % — ABNORMAL LOW (ref 36.0–46.0)
Hemoglobin: 11 g/dL — ABNORMAL LOW (ref 12.0–15.0)
Immature Granulocytes: 0 %
Lymphocytes Relative: 21 %
Lymphs Abs: 1.2 10*3/uL (ref 0.7–4.0)
MCH: 28.6 pg (ref 26.0–34.0)
MCHC: 32.1 g/dL (ref 30.0–36.0)
MCV: 89.3 fL (ref 80.0–100.0)
Monocytes Absolute: 0.4 10*3/uL (ref 0.1–1.0)
Monocytes Relative: 7 %
Neutro Abs: 3.8 10*3/uL (ref 1.7–7.7)
Neutrophils Relative %: 68 %
Platelet Count: 330 10*3/uL (ref 150–400)
RBC: 3.84 MIL/uL — ABNORMAL LOW (ref 3.87–5.11)
RDW: 13 % (ref 11.5–15.5)
WBC Count: 5.7 10*3/uL (ref 4.0–10.5)
nRBC: 0 % (ref 0.0–0.2)

## 2023-03-05 LAB — CMP (CANCER CENTER ONLY)
ALT: 10 U/L (ref 0–44)
AST: 13 U/L — ABNORMAL LOW (ref 15–41)
Albumin: 4.1 g/dL (ref 3.5–5.0)
Alkaline Phosphatase: 64 U/L (ref 38–126)
Anion gap: 7 (ref 5–15)
BUN: 14 mg/dL (ref 8–23)
CO2: 30 mmol/L (ref 22–32)
Calcium: 9.5 mg/dL (ref 8.9–10.3)
Chloride: 104 mmol/L (ref 98–111)
Creatinine: 0.55 mg/dL (ref 0.44–1.00)
GFR, Estimated: >60 mL/min (ref >60)
Glucose, Bld: 92 mg/dL (ref 70–99)
Potassium: 3.7 mmol/L (ref 3.5–5.1)
Sodium: 141 mmol/L (ref 135–145)
Total Bilirubin: 0.7 mg/dL (ref 0.3–1.2)
Total Protein: 7.1 g/dL (ref 6.5–8.1)

## 2023-03-05 MED ORDER — HEPARIN SOD (PORK) LOCK FLUSH 100 UNIT/ML IV SOLN: 500.0000 [IU] | Freq: Once | INTRAVENOUS | Status: DC | PRN

## 2023-03-05 MED ORDER — SODIUM CHLORIDE 0.9% FLUSH: 10.0000 mL | INTRAVENOUS | Status: DC | PRN

## 2023-03-05 MED ORDER — SODIUM CHLORIDE 0.9 % IV SOLN
480.0000 mg | Freq: Once | INTRAVENOUS | Status: AC
  Administered 2023-03-05: 480 mg via INTRAVENOUS
  Filled 2023-03-05: qty 48

## 2023-03-05 MED ORDER — SODIUM CHLORIDE 0.9 % IV SOLN: Freq: Once | INTRAVENOUS | Status: AC

## 2023-03-05 NOTE — Assessment & Plan Note (Signed)
She tolerated treatment well except for joint pain which is well-controlled with over-the-counter analgesics Plan to repeat imaging study next month before her next treatment

## 2023-03-05 NOTE — Patient Instructions (Signed)
[facility]  Discharge Instructions: Thank you for choosing [facility] to provide your oncology and hematology care.   If you have a lab appointment with the Cancer Center, please go directly to the Cancer Center and check in at the registration area.   Wear comfortable clothing and clothing appropriate for easy access to any Portacath or PICC line.   We strive to give you quality time with your provider. You may need to reschedule your appointment if you arrive late (15 or more minutes).  Arriving late affects you and other patients whose appointments are after yours.  Also, if you miss three or more appointments without notifying the office, you may be dismissed from the clinic at the provider's discretion.      For prescription refill requests, have your pharmacy contact our office and allow 72 hours for refills to be completed.    Today you received the following chemotherapy and/or immunotherapy agents: Opdivo      To help prevent nausea and vomiting after your treatment, we encourage you to take your nausea medication as directed.  BELOW ARE SYMPTOMS THAT SHOULD BE REPORTED IMMEDIATELY: *FEVER GREATER THAN 100.4 F (38 C) OR HIGHER *CHILLS OR SWEATING *NAUSEA AND VOMITING THAT IS NOT CONTROLLED WITH YOUR NAUSEA MEDICATION *UNUSUAL SHORTNESS OF BREATH *UNUSUAL BRUISING OR BLEEDING *URINARY PROBLEMS (pain or burning when urinating, or frequent urination) *BOWEL PROBLEMS (unusual diarrhea, constipation, pain near the anus) TENDERNESS IN MOUTH AND THROAT WITH OR WITHOUT PRESENCE OF ULCERS (sore throat, sores in mouth, or a toothache) UNUSUAL RASH, SWELLING OR PAIN  UNUSUAL VAGINAL DISCHARGE OR ITCHING   Items with * indicate a potential emergency and should be followed up as soon as possible or go to the Emergency Department if any problems should occur.  Please show the CHEMOTHERAPY ALERT CARD or IMMUNOTHERAPY ALERT CARD at check-in  to the Emergency Department and triage nurse.  Should you have questions after your visit or need to cancel or reschedule your appointment, please contact [facility]  Dept: [phone]  and follow the prompts.  Office hours are 8:00 a.m. to 4:30 p.m. Monday - Friday. Please note that voicemails left after 4:00 p.m. may not be returned until the following business day.  We are closed weekends and major holidays. You have access to a nurse at all times for urgent questions. Please call the main number to the clinic Dept: [phone] and follow the prompts.   For any non-urgent questions, you may also contact your provider using MyChart. We now offer e-Visits for anyone 18 and older to request care online for non-urgent symptoms. For details visit [email or web address].   Also download the MyChart app! Go to the app store, search "MyChart", open the app, select , and log in with your MyChart username and password.   

## 2023-03-05 NOTE — Progress Notes (Signed)
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[facility]) She tolerated treatment well except for joint pain which is well-controlled with over-the-counter analgesics Plan to repeat imaging study next month before her next treatment  PMR (polymyalgia rheumatica) (HCC) She will continue acetaminophen and ibuprofen as needed  Orders Placed This Encounter  Procedures   CT ABDOMEN PELVIS W CONTRAST    Standing Status:   Future    Standing Expiration Date:   03/04/2024    Order Specific Question:   If indicated for the ordered procedure, I authorize the administration of contrast media per Radiology protocol    Answer:   Yes    Order Specific Question:   Does the patient have a contrast media/X-ray dye allergy?    Answer:   No    Order Specific Question:   Preferred imaging location?    Answer:   [facility]    Order Specific Question:   If indicated for the ordered procedure, I authorize the administration of oral contrast media per Radiology protocol    Answer:   Yes    All questions were answered. The patient knows to call the clinic with any problems, questions or concerns. The total time spent in the appointment was 20 minutes encounter with patients including review of chart and various tests results, discussions about plan of care and coordination of care plan   Artis Delay, MD 03/05/2023 10:59 AM  INTERVAL HISTORY: Please see below for problem oriented charting. she returns for treatment follow-up She tolerated last treatment well She takes acetaminophen with ibuprofen as needed to control her symptoms of joint pain She has no complaints of new vulva changes  REVIEW OF SYSTEMS:   Constitutional: Denies fevers, chills or abnormal weight loss Eyes: Denies blurriness of vision Ears, nose, mouth, throat, and face: Denies mucositis or sore throat Respiratory:  Denies cough, dyspnea or wheezes Cardiovascular: Denies palpitation, chest discomfort or lower extremity swelling Gastrointestinal:  Denies nausea, heartburn or change in bowel habits Skin: Denies abnormal skin rashes Lymphatics: Denies new lymphadenopathy or easy bruising Neurological:Denies numbness, tingling or new weaknesses Behavioral/Psych: Mood is stable, no new changes  All other systems were reviewed with the patient and are negative.  I have reviewed the past medical history, past surgical history, social history and family history with the patient and they are unchanged from previous note.  ALLERGIES:  has No Known Allergies.  MEDICATIONS:  Current Outpatient Medications  Medication Sig Dispense Refill   ibuprofen (ADVIL) 400 MG tablet Take 400 mg by mouth every 6 (six) hours as needed for moderate pain.     acetaminophen (TYLENOL) 650 MG CR tablet Take 650 mg by mouth every 8 (eight) hours as needed for pain.     ammonium lactate (LAC-HYDRIN) 12 % lotion Apply 1 Application topically as needed.     Bioflavonoid Products (BIOFLEX) TABS Take 750 each by mouth daily. 2 tablets     calcium carbonate (TUMS - DOSED IN MG ELEMENTAL CALCIUM) 500 MG chewable tablet Chew 1-2 tablets by mouth daily as needed for indigestion or heartburn.     cholecalciferol (VITAMIN D3) 25 MCG (1000 UNIT) tablet Take 2,000 Units by mouth daily.     hydrochlorothiazide (HYDRODIURIL) 25 MG tablet TAKE 1 TABLET BY MOUTH DAILY AS NEEDED. (Patient taking differently: Take 25 mg by mouth daily.) 90 tablet 3   KLOR-CON M20  20 MEQ tablet TAKE 1 TABLET BY MOUTH EVERY DAY 90 tablet 1   Menthol-Methyl Salicylate (SALONPAS PAIN RELIEF PATCH EX) Apply 1 patch topically daily as needed (pain).     Multiple Vitamins-Minerals (MULTIVITAMIN WITH MINERALS) tablet Take 1 tablet by mouth daily.     No current facility-administered medications for this visit.    SUMMARY OF ONCOLOGIC HISTORY: Oncology History Overview Note   BRAF: undetectable (negative)   Melanoma of vulva (HCC)  10/26/2020 Initial Diagnosis   She was noted to have bloody discharge in her underwear.  She denies pain   12/07/2020 Initial Biopsy   FINAL MICROSCOPIC DIAGNOSIS:   A.   RIGHT VULVA, BIOPSY: PRIMARY MALIGNANT MELANOMA OF VULVA, MELANOMA  TABLE BELOW  MALIGNANT MELANOMA (AJCC 8TH EDITION):  PROCEDURE: BIOPSY  SPECIMEN ANATOMIC SITE: RIGHT VULVA  BRESLOW'S DEPTH/MAXIMUM TUMOR THICKNESS: 5.05 MM  CLARK/ANATOMIC LEVEL* IV  MARGINS:  PERIPHERAL: INVOLVED  DEEP: FREE  ULCERATION: PRESENT  MITOTIC INDEX: 11/mm2  LYMPHO-VASCULAR INVASION: PRESENT*  NEUTROTROPISM: ABSENT  TUMOR-INFILTRATING LYMPHOCYTES: NON-BRISK  LYMPH NODES: N/A  PATHOLOGIC STAGE: PT4B NX MX  *VULVAR MELANOMAS MAY NOT HAVE THE SAME BEHAVIOR STAGE FOR STAGE AS SUPERFICIAL SPREADING CUTANEOUS MELANOMA, AND LANDMARKS SUCH AS BRESLOW'S DEPTH MAY NOT APPLY. NONETHELESS, THIS SHOULD BE TREATED AS A PT4B MELANOMA AND COMPLETE EXCISION, WITH POSSIBLE LYMPH NODE EVALUATION, UNDERTAKEN. SEE COMMENT FOR IHC PROFILE.    COMMENT:  Sections show confluent and nested growth of atypical melanocytes along vulvar epithelium in an in-situ melanoma growth pattern at the periphery, with a central large ulcerated nodule of similar atypical  melanocytes, consonant with primary vulvar melanoma. Notably, IHC was performed to evaluate the lineage and is diagnostic for melanoma. Lesional melanocytes are positive with S100, MelanA, and show a high ki-67 uptake, and are negative with CDX-2, synaptophysin, CK20, TTF-1, CK7, CD56, and lymphoid markers CD5, CD3, CD20 highlight only background lymphocytes.    12/17/2020 Initial Diagnosis   Melanoma of vulva (HCC)   12/17/2020 Cancer Staging   Staging form: Melanoma of the Skin, AJCC 8th Edition - Clinical stage from 12/17/2020: Stage III (rcT4b, cN2, cM0) - Signed by Artis Delay, MD on 10/13/2022 Stage prefix: Recurrence   12/30/2020 PET scan   1.  The only potential hypermetabolic finding of note is a small flat focus of accentuated metabolic activity along the anterior vaginal vestibule/perineum, maximum SUV 6.5. Most common cause for this type of appearance would be a trace amount of incontinence of urinary FDG, but given the proximity to the reported vulvar melanoma, this small focus is considered indeterminate. 2. Other imaging findings of potential clinical significance: Aortic Atherosclerosis (ICD10-I70.0). Small type 1 hiatal hernia. Palatine tonsilloliths. Colonic diverticulosis.   01/18/2021 Surgery   Preoperative Diagnosis: Vulvar melanoma  Postoperative Diagnosis: Same  Procedures performed: Wide radical vulvectomy, R inguinofemoral sentinel LN biopsy  Attending Surgeon: Eugene Garnet, MD  Fellow: Leticia Clas  Resident: Georgana Curio, MD; Sallee Provencal, MD  Anesthesia: General endotracheal  Findings:  1. Vulva with biopsy site changes at R labia minora adjacent to the clitoris. No visible tumor. 2. Inguinofemoral nodes not clinically enlarged or abnormal.   Specimens:  1. R inguinal sentinel LN, hot and blue 2. Portion of vulva 3. Superior deep margin    01/18/2021 Pathology Results   Only melanoma in situ seen in final resection, SLN negative   02/08/2021 - 03/22/2021 Chemotherapy         03/29/2021 Genetic Testing   Negative genetic testing on the Multi-cancer +RNA  panel.  The report date is March 29, 2021.  The Multi-Gene Panel offered by [facility] includes sequencing and/or deletion duplication testing of the following 84 genes: AIP, ALK, APC, ATM, AXIN2,BAP1,  BARD1, BLM, BMPR1A, BRCA1, BRCA2, BRIP1, CASR, CDC73, CDH1, CDK4, CDKN1B, CDKN1C, CDKN2A (p14ARF), CDKN2A (p16INK4a), CEBPA, CHEK2, CTNNA1, DICER1, DIS3L2, EGFR (c.2369C>T, p.Thr790Met variant only), EPCAM (Deletion/duplication testing only), FH, FLCN, GATA2, GPC3, GREM1 (Promoter region deletion/duplication testing only), HOXB13 (c.251G>A,  p.Gly84Glu), HRAS, KIT, MAX, MEN1, MET, MITF (c.952G>A, p.Glu318Lys variant only), MLH1, MSH2, MSH3, MSH6, MUTYH, NBN, NF1, NF2, NTHL1, PALB2, PDGFRA, PHOX2B, PMS2, POLD1, POLE, POT1, PRKAR1A, PTCH1, PTEN, RAD50, RAD51C, RAD51D, RB1, RECQL4, RET, RUNX1, SDHAF2, SDHA (sequence changes only), SDHB, SDHC, SDHD, SMAD4, SMARCA4, SMARCB1, SMARCE1, STK11, SUFU, TERC, TERT, TMEM127, TP53, TSC1, TSC2, VHL, WRN and WT1.     04/29/2021 PET scan   1. No vulvar mass or residual hypermetabolism. 2. No findings suspicious for metastatic disease.   11/28/2021 Pathology Results   Vulvar biopsy - fibrosis c/w scar. Small junctional nevus. No melanoma identified.   08/31/2022 PET scan   1. Enlarging RIGHT inguinal lymph node suspicious for metastatic disease. This shows only mild to moderate increased metabolic activity but displays FDG uptake greater than blood pool and shows abnormal morphology. Tissue sampling may be helpful. 2. No additional signs of disease in the neck, chest, abdomen or pelvis. 3. Signs of pelvic floor dysfunction and probable cystocele. 4. Chronic fracture of RIGHT inferior pubic ramus signs of healing since previous imaging. 5. Aortic atherosclerosis. 6. Moderate to large hiatal hernia.   Aortic Atherosclerosis (ICD10-I70.0).   09/04/2022 Pathology Results   FINAL MICROSCOPIC DIAGNOSIS:   A. LYMPH NODE, RIGHT INGUINAL, BIOPSY:  Metastatic melanoma.  See comment.   COMMENT:  The tumor cells are loosely cohesive with foci of cytoplasmic pigment. Immunohistochemistry is positive with Melan-A, S100 and SOX10.  The morphology and immunophenotype are consistent with metastatic melanoma.      09/27/2022 Surgery   Surgery: Excision of enlarged right inguinal lymph node   Operative findings: 2 cm palpable but mobile right inguinal lymph node, hyperpigmented. No other palpable adenopathy.   11/13/2022 -  Chemotherapy   Patient is on Treatment Plan : MELANOMA Nivolumab (480) q28d      01/03/2023 Imaging   1. Enlarged right inguinal lymph node measuring 1.1 cm in short axis, similar to prior PET-CT. 2. No other potential sites of metastatic disease noted elsewhere in the chest, abdomen or pelvis. 3. Moderate-sized hiatal hernia. 4. Colonic diverticulosis without evidence of acute diverticulitis at this time. 5. Aortic atherosclerosis. 6. Additional incidental findings, as above.       PHYSICAL EXAMINATION: ECOG PERFORMANCE STATUS: 1 - Symptomatic but completely ambulatory  Vitals:   03/05/23 1052  BP: 127/67  Pulse: 70  Resp: 18  Temp: 97.8 F (36.6 C)  SpO2: 100%   Filed Weights   03/05/23 1052  Weight: 179 lb (81.2 kg)    GENERAL:alert, no distress and comfortable NEURO: alert & oriented x 3 with fluent speech, no focal motor/sensory deficits  LABORATORY DATA:  I have reviewed the data as listed    Component Value Date/Time   NA 141 02/05/2023 1049   K 3.7 02/05/2023 1049   CL 104 02/05/2023 1049   CO2 28 02/05/2023 1049   GLUCOSE 99 02/05/2023 1049   BUN 12 02/05/2023 1049   CREATININE 0.46 02/05/2023 1049   CREATININE 0.82 08/23/2020 1048   CALCIUM 9.5 02/05/2023 1049   PROT 7.0 02/05/2023 1049  ALBUMIN 4.1 02/05/2023 1049   AST 12 (L) 02/05/2023 1049   ALT 8 02/05/2023 1049   ALKPHOS 60 02/05/2023 1049   BILITOT 0.8 02/05/2023 1049   GFRNONAA >60 02/05/2023 1049   GFRNONAA 77 08/23/2020 1048   GFRAA 89 08/23/2020 1048    No results found for: "SPEP", "UPEP"  Lab Results  Component Value Date   WBC 5.7 03/05/2023   NEUTROABS 3.8 03/05/2023   HGB 11.0 (L) 03/05/2023   HCT 34.3 (L) 03/05/2023   MCV 89.3 03/05/2023   PLT 330 03/05/2023      Chemistry      Component Value Date/Time   NA 141 02/05/2023 1049   K 3.7 02/05/2023 1049   CL 104 02/05/2023 1049   CO2 28 02/05/2023 1049   BUN 12 02/05/2023 1049   CREATININE 0.46 02/05/2023 1049   CREATININE 0.82 08/23/2020 1048      Component Value Date/Time   CALCIUM 9.5  02/05/2023 1049   ALKPHOS 60 02/05/2023 1049   AST 12 (L) 02/05/2023 1049   ALT 8 02/05/2023 1049   BILITOT 0.8 02/05/2023 1049

## 2023-03-05 NOTE — Assessment & Plan Note (Signed)
She will continue acetaminophen and ibuprofen as needed

## 2023-03-26 ENCOUNTER — Encounter (HOSPITAL_COMMUNITY): Payer: Self-pay

## 2023-03-26 ENCOUNTER — Ambulatory Visit (HOSPITAL_COMMUNITY)
Admission: RE | Admit: 2023-03-26 | Discharge: 2023-03-26 | Disposition: A | Discharge status: Home / Self Care | Payer: Medicare HMO | Source: Ambulatory Visit | Attending: Hematology and Oncology | Admitting: Hematology and Oncology

## 2023-03-26 DIAGNOSIS — C519 Malignant neoplasm of vulva, unspecified: Secondary | ICD-10-CM | POA: Insufficient documentation

## 2023-03-26 MED ORDER — IOHEXOL 300 MG/ML  SOLN
100.0000 mL | Freq: Once | INTRAMUSCULAR | Status: AC | PRN
  Administered 2023-03-26: 100 mL via INTRAVENOUS

## 2023-03-26 MED ORDER — SODIUM CHLORIDE (PF) 0.9 % IJ SOLN
INTRAMUSCULAR | Status: AC
  Filled 2023-03-26: qty 50

## 2023-04-02 ENCOUNTER — Inpatient Hospital Stay: Payer: Medicare HMO

## 2023-04-02 ENCOUNTER — Other Ambulatory Visit: Payer: Self-pay

## 2023-04-02 ENCOUNTER — Inpatient Hospital Stay: Payer: Medicare HMO | Attending: Gynecologic Oncology

## 2023-04-02 ENCOUNTER — Inpatient Hospital Stay (HOSPITAL_BASED_OUTPATIENT_CLINIC_OR_DEPARTMENT_OTHER): Payer: Medicare HMO | Admitting: Hematology and Oncology

## 2023-04-02 ENCOUNTER — Encounter: Payer: Self-pay | Admitting: Hematology and Oncology

## 2023-04-02 VITALS — BP 140/68 | HR 62 | Temp 98.1°F | Resp 17 | Wt 178.0 lb

## 2023-04-02 VITALS — BP 124/62 | HR 57

## 2023-04-02 DIAGNOSIS — Z5112 Encounter for antineoplastic immunotherapy: Secondary | ICD-10-CM | POA: Diagnosis present

## 2023-04-02 DIAGNOSIS — C519 Malignant neoplasm of vulva, unspecified: Secondary | ICD-10-CM

## 2023-04-02 DIAGNOSIS — D539 Nutritional anemia, unspecified: Secondary | ICD-10-CM | POA: Insufficient documentation

## 2023-04-02 DIAGNOSIS — M353 Polymyalgia rheumatica: Secondary | ICD-10-CM | POA: Diagnosis not present

## 2023-04-02 DIAGNOSIS — Z79620 Long term (current) use of immunosuppressive biologic: Secondary | ICD-10-CM | POA: Insufficient documentation

## 2023-04-02 LAB — CMP (CANCER CENTER ONLY)
ALT: 9 U/L (ref 0–44)
AST: 13 U/L — ABNORMAL LOW (ref 15–41)
Albumin: 3.8 g/dL (ref 3.5–5.0)
Alkaline Phosphatase: 66 U/L (ref 38–126)
Anion gap: 8 (ref 5–15)
BUN: 19 mg/dL (ref 8–23)
CO2: 26 mmol/L (ref 22–32)
Calcium: 9.6 mg/dL (ref 8.9–10.3)
Chloride: 107 mmol/L (ref 98–111)
Creatinine: 0.49 mg/dL (ref 0.44–1.00)
GFR, Estimated: >60 mL/min (ref >60)
Glucose, Bld: 82 mg/dL (ref 70–99)
Potassium: 3.4 mmol/L — ABNORMAL LOW (ref 3.5–5.1)
Sodium: 141 mmol/L (ref 135–145)
Total Bilirubin: 0.7 mg/dL (ref 0.3–1.2)
Total Protein: 7 g/dL (ref 6.5–8.1)

## 2023-04-02 LAB — CBC WITH DIFFERENTIAL (CANCER CENTER ONLY)
Abs Immature Granulocytes: 0.02 10*3/uL (ref 0.00–0.07)
Basophils Absolute: 0 10*3/uL (ref 0.0–0.1)
Basophils Relative: 1 %
Eosinophils Absolute: 0.2 10*3/uL (ref 0.0–0.5)
Eosinophils Relative: 3 %
HCT: 34.1 % — ABNORMAL LOW (ref 36.0–46.0)
Hemoglobin: 11.1 g/dL — ABNORMAL LOW (ref 12.0–15.0)
Immature Granulocytes: 0 %
Lymphocytes Relative: 26 %
Lymphs Abs: 1.3 10*3/uL (ref 0.7–4.0)
MCH: 28.8 pg (ref 26.0–34.0)
MCHC: 32.6 g/dL (ref 30.0–36.0)
MCV: 88.6 fL (ref 80.0–100.0)
Monocytes Absolute: 0.4 10*3/uL (ref 0.1–1.0)
Monocytes Relative: 7 %
Neutro Abs: 3.1 10*3/uL (ref 1.7–7.7)
Neutrophils Relative %: 63 %
Platelet Count: 329 10*3/uL (ref 150–400)
RBC: 3.85 MIL/uL — ABNORMAL LOW (ref 3.87–5.11)
RDW: 13.5 % (ref 11.5–15.5)
WBC Count: 5 10*3/uL (ref 4.0–10.5)
nRBC: 0 % (ref 0.0–0.2)

## 2023-04-02 LAB — TSH: TSH: 3.394 u[IU]/mL (ref 0.350–4.500)

## 2023-04-02 MED ORDER — SODIUM CHLORIDE 0.9 % IV SOLN: Freq: Once | INTRAVENOUS | Status: AC

## 2023-04-02 MED ORDER — SODIUM CHLORIDE 0.9 % IV SOLN
480.0000 mg | Freq: Once | INTRAVENOUS | Status: AC
  Administered 2023-04-02: 480 mg via INTRAVENOUS
  Filled 2023-04-02: qty 48

## 2023-04-02 NOTE — Progress Notes (Signed)
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[facility]) I have reviewed multiple CT imaging with the patient She has excellent response to therapy with complete resolution of previously seen lymphadenopathy Overall, she tolerated treatment quite well The plan will be to continue treatment until the end of the year Plan to repeat imaging study in January  PMR (polymyalgia rheumatica) (HCC) She will continue acetaminophen and ibuprofen as needed  Deficiency anemia This is due to side effects of treatment and flare from polymyalgia rheumatica Observe closely for now  Orders Placed This Encounter  Procedures   CBC with Differential (Cancer Center Only)    Standing Status:   Future    Standing Expiration Date:   04/29/2024   CMP (Cancer Center only)    Standing Status:   Future    Standing Expiration Date:   04/29/2024   T4    Standing Status:   Future    Standing Expiration Date:   04/29/2024   TSH    Standing Status:   Future    Standing Expiration Date:   04/29/2024   CBC with Differential (Cancer Center Only)    Standing Status:   Future    Standing Expiration Date:   05/28/2024   CMP (Cancer Center only)    Standing Status:   Future    Standing Expiration Date:   05/28/2024   T4    Standing Status:   Future    Standing Expiration Date:   05/28/2024   TSH    Standing Status:   Future    Standing Expiration Date:   05/28/2024   CBC with Differential (Cancer Center Only)    Standing Status:   Future    Standing Expiration Date:   06/25/2024   CMP (Cancer Center only)    Standing Status:   Future    Standing Expiration Date:   06/25/2024   T4    Standing Status:   Future    Standing Expiration Date:   06/25/2024   TSH    Standing Status:   Future    Standing Expiration Date:   06/25/2024   CBC with Differential (Cancer Center Only)    Standing Status:   Future     Standing Expiration Date:   07/23/2024   CMP (Cancer Center only)    Standing Status:   Future    Standing Expiration Date:   07/23/2024   T4    Standing Status:   Future    Standing Expiration Date:   07/23/2024   TSH    Standing Status:   Future    Standing Expiration Date:   07/23/2024   CBC with Differential (Cancer Center Only)    Standing Status:   Future    Standing Expiration Date:   08/20/2024   CMP (Cancer Center only)    Standing Status:   Future    Standing Expiration Date:   08/20/2024   T4    Standing Status:   Future    Standing Expiration Date:   08/20/2024   TSH    Standing Status:   Future    Standing Expiration Date:   08/20/2024   CBC with Differential (Cancer Center Only)    Standing Status:   Future    Standing Expiration Date:   09/17/2024   CMP (Cancer Center only)    Standing Status:   Future  Standing Expiration Date:   09/17/2024   T4    Standing Status:   Future    Standing Expiration Date:   09/17/2024   TSH    Standing Status:   Future    Standing Expiration Date:   09/17/2024    All questions were answered. The patient knows to call the clinic with any problems, questions or concerns. The total time spent in the appointment was 30 minutes encounter with patients including review of chart and various tests results, discussions about plan of care and coordination of care plan   Artis Delay, MD 04/02/2023 11:29 AM  INTERVAL HISTORY: Please see below for problem oriented charting. she returns for treatment follow-up We discussed recent CT imaging results and review the scans She tolerated recent treatment well She continues to have intermittent joint pain but well-controlled with over-the-counter acetaminophen and ibuprofen No other side effects of treatment  REVIEW OF SYSTEMS:   Constitutional: Denies fevers, chills or abnormal weight loss Eyes: Denies blurriness of vision Ears, nose, mouth, throat, and face: Denies mucositis or sore  throat Respiratory: Denies cough, dyspnea or wheezes Cardiovascular: Denies palpitation, chest discomfort or lower extremity swelling Gastrointestinal:  Denies nausea, heartburn or change in bowel habits Skin: Denies abnormal skin rashes Lymphatics: Denies new lymphadenopathy or easy bruising Neurological:Denies numbness, tingling or new weaknesses Behavioral/Psych: Mood is stable, no new changes  All other systems were reviewed with the patient and are negative.  I have reviewed the past medical history, past surgical history, social history and family history with the patient and they are unchanged from previous note.  ALLERGIES:  has No Known Allergies.  MEDICATIONS:  Current Outpatient Medications  Medication Sig Dispense Refill   acetaminophen (TYLENOL) 650 MG CR tablet Take 650 mg by mouth every 8 (eight) hours as needed for pain.     ammonium lactate (LAC-HYDRIN) 12 % lotion Apply 1 Application topically as needed.     Bioflavonoid Products (BIOFLEX) TABS Take 750 each by mouth daily. 2 tablets     calcium carbonate (TUMS - DOSED IN MG ELEMENTAL CALCIUM) 500 MG chewable tablet Chew 1-2 tablets by mouth daily as needed for indigestion or heartburn.     cholecalciferol (VITAMIN D3) 25 MCG (1000 UNIT) tablet Take 2,000 Units by mouth daily.     hydrochlorothiazide (HYDRODIURIL) 25 MG tablet TAKE 1 TABLET BY MOUTH DAILY AS NEEDED. (Patient taking differently: Take 25 mg by mouth daily.) 90 tablet 3   ibuprofen (ADVIL) 400 MG tablet Take 400 mg by mouth every 6 (six) hours as needed for moderate pain.     KLOR-CON M20 20 MEQ tablet TAKE 1 TABLET BY MOUTH EVERY DAY 90 tablet 1   Menthol-Methyl Salicylate (SALONPAS PAIN RELIEF PATCH EX) Apply 1 patch topically daily as needed (pain).     Multiple Vitamins-Minerals (MULTIVITAMIN WITH MINERALS) tablet Take 1 tablet by mouth daily.     No current facility-administered medications for this visit.    SUMMARY OF ONCOLOGIC HISTORY: Oncology  History Overview Note  BRAF: undetectable (negative)   Melanoma of vulva (HCC)  10/26/2020 Initial Diagnosis   She was noted to have bloody discharge in her underwear.  She denies pain   12/07/2020 Initial Biopsy   FINAL MICROSCOPIC DIAGNOSIS:   A.   RIGHT VULVA, BIOPSY: PRIMARY MALIGNANT MELANOMA OF VULVA, MELANOMA  TABLE BELOW  MALIGNANT MELANOMA (AJCC 8TH EDITION):  PROCEDURE: BIOPSY  SPECIMEN ANATOMIC SITE: RIGHT VULVA  BRESLOW'S DEPTH/MAXIMUM TUMOR THICKNESS: 5.05 MM  CLARK/ANATOMIC LEVEL* IV  MARGINS:  PERIPHERAL: INVOLVED  DEEP: FREE  ULCERATION: PRESENT  MITOTIC INDEX: 11/mm2  LYMPHO-VASCULAR INVASION: PRESENT*  NEUTROTROPISM: ABSENT  TUMOR-INFILTRATING LYMPHOCYTES: NON-BRISK  LYMPH NODES: N/A  PATHOLOGIC STAGE: PT4B NX MX  *VULVAR MELANOMAS MAY NOT HAVE THE SAME BEHAVIOR STAGE FOR STAGE AS SUPERFICIAL SPREADING CUTANEOUS MELANOMA, AND LANDMARKS SUCH AS BRESLOW'S DEPTH MAY NOT APPLY. NONETHELESS, THIS SHOULD BE TREATED AS A PT4B MELANOMA AND COMPLETE EXCISION, WITH POSSIBLE LYMPH NODE EVALUATION, UNDERTAKEN. SEE COMMENT FOR IHC PROFILE.    COMMENT:  Sections show confluent and nested growth of atypical melanocytes along vulvar epithelium in an in-situ melanoma growth pattern at the periphery, with a central large ulcerated nodule of similar atypical  melanocytes, consonant with primary vulvar melanoma. Notably, IHC was performed to evaluate the lineage and is diagnostic for melanoma. Lesional melanocytes are positive with S100, MelanA, and show a high ki-67 uptake, and are negative with CDX-2, synaptophysin, CK20, TTF-1, CK7, CD56, and lymphoid markers CD5, CD3, CD20 highlight only background lymphocytes.    12/17/2020 Initial Diagnosis   Melanoma of vulva (HCC)   12/17/2020 Cancer Staging   Staging form: Melanoma of the Skin, AJCC 8th Edition - Clinical stage from 12/17/2020: Stage III (rcT4b, cN2, cM0) - Signed by Artis Delay, MD on 10/13/2022 Stage prefix: Recurrence    12/30/2020 PET scan   1. The only potential hypermetabolic finding of note is a small flat focus of accentuated metabolic activity along the anterior vaginal vestibule/perineum, maximum SUV 6.5. Most common cause for this type of appearance would be a trace amount of incontinence of urinary FDG, but given the proximity to the reported vulvar melanoma, this small focus is considered indeterminate. 2. Other imaging findings of potential clinical significance: Aortic Atherosclerosis (ICD10-I70.0). Small type 1 hiatal hernia. Palatine tonsilloliths. Colonic diverticulosis.   01/18/2021 Surgery   Preoperative Diagnosis: Vulvar melanoma  Postoperative Diagnosis: Same  Procedures performed: Wide radical vulvectomy, R inguinofemoral sentinel LN biopsy  Attending Surgeon: Eugene Garnet, MD  Fellow: Leticia Clas  Resident: Georgana Curio, MD; Sallee Provencal, MD  Anesthesia: General endotracheal  Findings:  1. Vulva with biopsy site changes at R labia minora adjacent to the clitoris. No visible tumor. 2. Inguinofemoral nodes not clinically enlarged or abnormal.   Specimens:  1. R inguinal sentinel LN, hot and blue 2. Portion of vulva 3. Superior deep margin    01/18/2021 Pathology Results   Only melanoma in situ seen in final resection, SLN negative   02/08/2021 - 03/22/2021 Chemotherapy         03/29/2021 Genetic Testing   Negative genetic testing on the Multi-cancer +RNA panel.  The report date is March 29, 2021.  The Multi-Gene Panel offered by [facility] includes sequencing and/or deletion duplication testing of the following 84 genes: AIP, ALK, APC, ATM, AXIN2,BAP1,  BARD1, BLM, BMPR1A, BRCA1, BRCA2, BRIP1, CASR, CDC73, CDH1, CDK4, CDKN1B, CDKN1C, CDKN2A (p14ARF), CDKN2A (p16INK4a), CEBPA, CHEK2, CTNNA1, DICER1, DIS3L2, EGFR (c.2369C>T, p.Thr790Met variant only), EPCAM (Deletion/duplication testing only), FH, FLCN, GATA2, GPC3, GREM1 (Promoter region deletion/duplication testing  only), HOXB13 (c.251G>A, p.Gly84Glu), HRAS, KIT, MAX, MEN1, MET, MITF (c.952G>A, p.Glu318Lys variant only), MLH1, MSH2, MSH3, MSH6, MUTYH, NBN, NF1, NF2, NTHL1, PALB2, PDGFRA, PHOX2B, PMS2, POLD1, POLE, POT1, PRKAR1A, PTCH1, PTEN, RAD50, RAD51C, RAD51D, RB1, RECQL4, RET, RUNX1, SDHAF2, SDHA (sequence changes only), SDHB, SDHC, SDHD, SMAD4, SMARCA4, SMARCB1, SMARCE1, STK11, SUFU, TERC, TERT, TMEM127, TP53, TSC1, TSC2, VHL, WRN and WT1.     04/29/2021 PET scan   1. No vulvar mass or residual hypermetabolism.  2. No findings suspicious for metastatic disease.   11/28/2021 Pathology Results   Vulvar biopsy - fibrosis c/w scar. Small junctional nevus. No melanoma identified.   08/31/2022 PET scan   1. Enlarging RIGHT inguinal lymph node suspicious for metastatic disease. This shows only mild to moderate increased metabolic activity but displays FDG uptake greater than blood pool and shows abnormal morphology. Tissue sampling may be helpful. 2. No additional signs of disease in the neck, chest, abdomen or pelvis. 3. Signs of pelvic floor dysfunction and probable cystocele. 4. Chronic fracture of RIGHT inferior pubic ramus signs of healing since previous imaging. 5. Aortic atherosclerosis. 6. Moderate to large hiatal hernia.   Aortic Atherosclerosis (ICD10-I70.0).   09/04/2022 Pathology Results   FINAL MICROSCOPIC DIAGNOSIS:   A. LYMPH NODE, RIGHT INGUINAL, BIOPSY:  Metastatic melanoma.  See comment.   COMMENT:  The tumor cells are loosely cohesive with foci of cytoplasmic pigment. Immunohistochemistry is positive with Melan-A, S100 and SOX10.  The morphology and immunophenotype are consistent with metastatic melanoma.      09/27/2022 Surgery   Surgery: Excision of enlarged right inguinal lymph node   Operative findings: 2 cm palpable but mobile right inguinal lymph node, hyperpigmented. No other palpable adenopathy.   11/13/2022 -  Chemotherapy   Patient is on Treatment Plan : MELANOMA  Nivolumab (480) q28d     01/03/2023 Imaging   1. Enlarged right inguinal lymph node measuring 1.1 cm in short axis, similar to prior PET-CT. 2. No other potential sites of metastatic disease noted elsewhere in the chest, abdomen or pelvis. 3. Moderate-sized hiatal hernia. 4. Colonic diverticulosis without evidence of acute diverticulitis at this time. 5. Aortic atherosclerosis. 6. Additional incidental findings, as above.     03/26/2023 Imaging   CT ABDOMEN PELVIS W CONTRAST  Result Date: 03/27/2023 CLINICAL DATA:  Melanoma, assess treatment response * Tracking Code: BO * EXAM: CT ABDOMEN AND PELVIS WITH CONTRAST TECHNIQUE: Multidetector CT imaging of the abdomen and pelvis was performed using the standard protocol following bolus administration of intravenous contrast. RADIATION DOSE REDUCTION: This exam was performed according to the departmental dose-optimization program which includes automated exposure control, adjustment of the mA and/or kV according to patient size and/or use of iterative reconstruction technique. CONTRAST:  OMNIPAQUE IOHEXOL 300 MG/ML  SOLN COMPARISON:  CT scan chest, abdomen and pelvis from 01/01/2023. FINDINGS: Lower chest: There are subpleural atelectatic changes in the visualized lung bases. No overt consolidation. No pleural effusion. The heart is normal in size. No pericardial effusion. 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 Non-cirrhotic configuration. No suspicious mass. Note is made of a lobulated 2.4 x 4.2 cm cyst in the left hepatic lobe, stable since the prior study. There is a stable subcentimeter hypoattenuating focus in the left hepatic lobe, segment 4B, which is too small to adequately characterize but appears unchanged since the prior study and favored benign as well. No intrahepatic or extrahepatic bile duct dilation. No calcified gallstones. Normal gallbladder wall thickness. No  pericholecystic inflammatory changes. Pancreas: Unremarkable. No pancreatic ductal dilatation or surrounding inflammatory changes. Spleen: Within normal limits. No focal lesion. Adrenals/Urinary Tract: Adrenal glands are unremarkable. No suspicious renal mass. Redemonstration of several sinus cysts in the left kidney. No hydronephrosis. No renal or ureteric calculi. Urinary bladder is under distended, precluding optimal assessment. However, no large mass or stones identified. No perivesical fat stranding. Stomach/Bowel: No disproportionate dilation of the small or large bowel loops. No evidence of  abnormal bowel wall thickening or inflammatory changes. The appendix is unremarkable. There are multiple diverticula throughout the colon, without imaging signs of diverticulitis. There is a small-to-moderate hiatal hernia. Vascular/Lymphatic: No ascites or pneumoperitoneum. No abdominal or pelvic lymphadenopathy, by size criteria. Previously noted right inguinal lymph node (series 2, image 110 of study from 01/01/2023) adjacent to the deepest surgical staple is no longer seen on this exam. There was also another smaller lymph node (series 2, image 112 of study dated 01/01/2023), which is also not seen on today's exam. No aneurysmal dilation of the major abdominal arteries. Reproductive: The uterus is unremarkable. No large adnexal mass. Other: There is a tiny fat containing umbilical hernia. There are metallic staples and postsurgical changes in the right inguinal region. Musculoskeletal: No suspicious osseous lesions. There are mild multilevel degenerative changes in the visualized spine. IMPRESSION: 1. No evidence of metastatic melanoma in the abdomen or pelvis. Previously noted right inguinal lymph nodes are no longer seen on today's exam. 2. Multiple other nonacute observations, as described above. Electronically Signed   By: Jules Schick M.D.   On: 03/27/2023 15:11        PHYSICAL EXAMINATION: ECOG  PERFORMANCE STATUS: 1 - Symptomatic but completely ambulatory  Vitals:   04/02/23 1117  BP: (!) 140/68  Pulse: 62  Resp: 17  Temp: 98.1 F (36.7 C)  SpO2: 98%   Filed Weights   04/02/23 1117  Weight: 178 lb (80.7 kg)    GENERAL:alert, no distress and comfortable NEURO: alert & oriented x 3 with fluent speech, no focal motor/sensory deficits  LABORATORY DATA:  I have reviewed the data as listed    Component Value Date/Time   NA 141 03/05/2023 1039   K 3.7 03/05/2023 1039   CL 104 03/05/2023 1039   CO2 30 03/05/2023 1039   GLUCOSE 92 03/05/2023 1039   BUN 14 03/05/2023 1039   CREATININE 0.55 03/05/2023 1039   CREATININE 0.82 08/23/2020 1048   CALCIUM 9.5 03/05/2023 1039   PROT 7.1 03/05/2023 1039   ALBUMIN 4.1 03/05/2023 1039   AST 13 (L) 03/05/2023 1039   ALT 10 03/05/2023 1039   ALKPHOS 64 03/05/2023 1039   BILITOT 0.7 03/05/2023 1039   GFRNONAA >60 03/05/2023 1039   GFRNONAA 77 08/23/2020 1048   GFRAA 89 08/23/2020 1048    No results found for: "SPEP", "UPEP"  Lab Results  Component Value Date   WBC 5.0 04/02/2023   NEUTROABS 3.1 04/02/2023   HGB 11.1 (L) 04/02/2023   HCT 34.1 (L) 04/02/2023   MCV 88.6 04/02/2023   PLT 329 04/02/2023      Chemistry      Component Value Date/Time   NA 141 03/05/2023 1039   K 3.7 03/05/2023 1039   CL 104 03/05/2023 1039   CO2 30 03/05/2023 1039   BUN 14 03/05/2023 1039   CREATININE 0.55 03/05/2023 1039   CREATININE 0.82 08/23/2020 1048      Component Value Date/Time   CALCIUM 9.5 03/05/2023 1039   ALKPHOS 64 03/05/2023 1039   AST 13 (L) 03/05/2023 1039   ALT 10 03/05/2023 1039   BILITOT 0.7 03/05/2023 1039       RADIOGRAPHIC STUDIES: I have reviewed multiple imaging studies with the patient I have personally reviewed the radiological images as listed and agreed with the findings in the report. CT ABDOMEN PELVIS W CONTRAST  Result Date: 03/27/2023 CLINICAL DATA:  Melanoma, assess treatment response *  Tracking Code: BO * EXAM: CT ABDOMEN AND  PELVIS WITH CONTRAST TECHNIQUE: Multidetector CT imaging of the abdomen and pelvis was performed using the standard protocol following bolus administration of intravenous contrast. RADIATION DOSE REDUCTION: This exam was performed according to the departmental dose-optimization program which includes automated exposure control, adjustment of the mA and/or kV according to patient size and/or use of iterative reconstruction technique. CONTRAST:  OMNIPAQUE IOHEXOL 300 MG/ML  SOLN COMPARISON:  CT scan chest, abdomen and pelvis from 01/01/2023. FINDINGS: Lower chest: There are subpleural atelectatic changes in the visualized lung bases. No overt consolidation. No pleural effusion. The heart is normal in size. No pericardial effusion. 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 Non-cirrhotic configuration. No suspicious mass. Note is made of a lobulated 2.4 x 4.2 cm cyst in the left hepatic lobe, stable since the prior study. There is a stable subcentimeter hypoattenuating focus in the left hepatic lobe, segment 4B, which is too small to adequately characterize but appears unchanged since the prior study and favored benign as well. No intrahepatic or extrahepatic bile duct dilation. No calcified gallstones. Normal gallbladder wall thickness. No pericholecystic inflammatory changes. Pancreas: Unremarkable. No pancreatic ductal dilatation or surrounding inflammatory changes. Spleen: Within normal limits. No focal lesion. Adrenals/Urinary Tract: Adrenal glands are unremarkable. No suspicious renal mass. Redemonstration of several sinus cysts in the left kidney. No hydronephrosis. No renal or ureteric calculi. Urinary bladder is under distended, precluding optimal assessment. However, no large mass or stones identified. No perivesical fat stranding. Stomach/Bowel: No disproportionate dilation of the small or large  bowel loops. No evidence of abnormal bowel wall thickening or inflammatory changes. The appendix is unremarkable. There are multiple diverticula throughout the colon, without imaging signs of diverticulitis. There is a small-to-moderate hiatal hernia. Vascular/Lymphatic: No ascites or pneumoperitoneum. No abdominal or pelvic lymphadenopathy, by size criteria. Previously noted right inguinal lymph node (series 2, image 110 of study from 01/01/2023) adjacent to the deepest surgical staple is no longer seen on this exam. There was also another smaller lymph node (series 2, image 112 of study dated 01/01/2023), which is also not seen on today's exam. No aneurysmal dilation of the major abdominal arteries. Reproductive: The uterus is unremarkable. No large adnexal mass. Other: There is a tiny fat containing umbilical hernia. There are metallic staples and postsurgical changes in the right inguinal region. Musculoskeletal: No suspicious osseous lesions. There are mild multilevel degenerative changes in the visualized spine. IMPRESSION: 1. No evidence of metastatic melanoma in the abdomen or pelvis. Previously noted right inguinal lymph nodes are no longer seen on today's exam. 2. Multiple other nonacute observations, as described above. Electronically Signed   By: Jules Schick M.D.   On: 03/27/2023 15:11

## 2023-04-02 NOTE — Assessment & Plan Note (Signed)
She will continue acetaminophen and ibuprofen as needed 

## 2023-04-02 NOTE — Assessment & Plan Note (Signed)
I have reviewed multiple CT imaging with the patient She has excellent response to therapy with complete resolution of previously seen lymphadenopathy Overall, she tolerated treatment quite well The plan will be to continue treatment until the end of the year Plan to repeat imaging study in January

## 2023-04-02 NOTE — Assessment & Plan Note (Signed)
This is due to side effects of treatment and flare from polymyalgia rheumatica Observe closely for now

## 2023-04-03 ENCOUNTER — Other Ambulatory Visit: Payer: Self-pay

## 2023-04-03 LAB — T4: T4, Total: 7.7 ug/dL (ref 4.5–12.0)

## 2023-04-13 ENCOUNTER — Encounter: Payer: Self-pay | Admitting: Family Medicine

## 2023-04-13 NOTE — Progress Notes (Unsigned)
HPI: Ms.Beverely S Sevcik is a 67 y.o. female, who is here today for her Welcome to Medicare visit.   *** lives with ***. Independent ADL's and IADL's. *** falls in the past year and denies depression symptoms.  Functional Status Survey: Is the patient deaf or have difficulty hearing?: (P) No Does the patient have difficulty seeing, even when wearing glasses/contacts?: (P) No Does the patient have difficulty concentrating, remembering, or making decisions?: (P) No Does the patient have difficulty walking or climbing stairs?: (P) Yes Does the patient have difficulty dressing or bathing?: (P) No Does the patient have difficulty doing errands alone such as visiting a doctor's office or shopping?: (P) No     04/12/2023    9:55 PM 10/06/2022    7:03 AM 10/03/2022    8:19 PM 12/12/2021    7:04 AM  Fall Risk   Falls in the past year? 0 0 1 0  Number falls in past yr:  0 0 0  Injury with Fall?  0 1 0  Risk for fall due to :  Other (Comment)  No Fall Risks  Follow up  Falls evaluation completed  Falls evaluation completed     Providers *** sees regularly:  Eye care provider: ***     10/06/2022    7:04 AM  Depression screen PHQ 2/9  Decreased Interest 0  Down, Depressed, Hopeless 0  PHQ - 2 Score 0     Immunization History  Administered Date(s) Administered   Fluad Quad(high Dose 65+) 10/06/2022   Influenza Split 08/29/2011, 06/18/2012   Influenza,inj,Quad PF,6+ Mos 07/29/2013, 06/08/2014, 06/21/2015, 07/04/2016, 07/23/2018, 07/25/2019, 07/26/2020   Influenza-Unspecified 07/23/2018   PNEUMOCOCCAL CONJUGATE-20 10/06/2022   Pneumococcal Polysaccharide-23 11/23/2020   Td 09/11/2005   Tdap 11/17/2014   Zoster Recombinant(Shingrix) 03/19/2018, 05/21/2018   Health Maintenance  Topic Date Due   Medicare Annual Wellness (AWV)  Never done   COVID-19 Vaccine (1) Never done   INFLUENZA VACCINE  04/26/2023   PAP SMEAR-Modifier  11/24/2023   DTaP/Tdap/Td (3 - Td or Tdap)  11/17/2024   MAMMOGRAM  12/11/2024   Colonoscopy  12/29/2024   Pneumonia Vaccine 60+ Years old  Completed   DEXA SCAN  Completed   Hepatitis C Screening  Completed   HIV Screening  Completed   Zoster Vaccines- Shingrix  Completed   HPV VACCINES  Aged Out    She has *** concerns today.  Review of Systems  Current Outpatient Medications on File Prior to Visit  Medication Sig Dispense Refill   acetaminophen (TYLENOL) 650 MG CR tablet Take 650 mg by mouth every 8 (eight) hours as needed for pain.     ammonium lactate (LAC-HYDRIN) 12 % lotion Apply 1 Application topically as needed.     Bioflavonoid Products (BIOFLEX) TABS Take 750 each by mouth daily. 2 tablets     calcium carbonate (TUMS - DOSED IN MG ELEMENTAL CALCIUM) 500 MG chewable tablet Chew 1-2 tablets by mouth daily as needed for indigestion or heartburn.     cholecalciferol (VITAMIN D3) 25 MCG (1000 UNIT) tablet Take 2,000 Units by mouth daily.     hydrochlorothiazide (HYDRODIURIL) 25 MG tablet TAKE 1 TABLET BY MOUTH DAILY AS NEEDED. (Patient taking differently: Take 25 mg by mouth daily.) 90 tablet 3   ibuprofen (ADVIL) 400 MG tablet Take 400 mg by mouth every 6 (six) hours as needed for moderate pain.     KLOR-CON M20 20 MEQ tablet TAKE 1 TABLET BY MOUTH EVERY DAY 90 tablet  1   Menthol-Methyl Salicylate (SALONPAS PAIN RELIEF PATCH EX) Apply 1 patch topically daily as needed (pain).     Multiple Vitamins-Minerals (MULTIVITAMIN WITH MINERALS) tablet Take 1 tablet by mouth daily.     [DISCONTINUED] prochlorperazine (COMPAZINE) 10 MG tablet Take 1 tablet (10 mg total) by mouth every 6 (six) hours as needed for nausea or vomiting. (Patient not taking: Reported on 03/07/2021) 30 tablet 1   No current facility-administered medications on file prior to visit.    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   melanoma   Complication  of anesthesia    hard to wake up after anesthesia   Family history of melanoma    History of kidney stones    Melanotic macule of vulvar labia    Migraine    Polymyalgia rheumatica (HCC)    Venous insufficiency     Past Surgical History:  Procedure Laterality Date   Cataract Right 05/22/2022   CATARACT EXTRACTION Left 06/05/2022   COLONOSCOPY     INGUINAL LYMPHADENECTOMY Right 09/27/2022   Procedure: RIGHT INGUINAL LYMPH NODE DEBULKING;  Surgeon: Carver Fila, MD;  Location: [facility];  Service: Gynecology;  Laterality: Right;   VULVECTOMY  01/19/2021   done at [facility], had sentinel node biopsy also    No Known Allergies  Family History  Problem Relation Age of Onset   Diabetes Mother    AVM Father    Melanoma Father        multiple melanomas dx between 64-90   Cancer Father 34       tumor on kidney   Cancer Paternal Grandfather        laryngeal cancer   Cancer Daughter 81       appendiceal cancer   Breast cancer Neg Hx    Ovarian cancer Neg Hx    Colon cancer Neg Hx    Uterine cancer Neg Hx    Colon polyps Neg Hx    Esophageal cancer Neg Hx    Stomach cancer Neg Hx    Rectal cancer Neg Hx     Social History   Socioeconomic History   Marital status: Married    Spouse name: Not on file   Number of children: 2   Years of education: Not on file   Highest education level: 12th grade  Occupational History   Occupation: Estate manager/land agent   Occupation: stocking  Tobacco Use   Smoking status: Never   Smokeless tobacco: Never  Vaping Use   Vaping status: Never Used  Substance and Sexual Activity   Alcohol use: Yes    Comment: occasional   Drug use: No   Sexual activity: Not Currently  Other Topics Concern   Not on file  Social History Narrative   Not on file   Social Determinants of Health   Financial Resource Strain: Not on file  Food Insecurity: No Food Insecurity (10/03/2022)   Hunger Vital Sign    Worried About Running Out of Food in the Last  Year: Never true    Ran Out of Food in the Last Year: Never true  Transportation Needs: No Transportation Needs (10/03/2022)   PRAPARE - Administrator, Civil Service (Medical): No    Lack of Transportation (Non-Medical): No  Physical Activity: Insufficiently Active (10/03/2022)   Exercise Vital Sign    Days of Exercise  per Week: 7 days    Minutes of Exercise per Session: 20 min  Stress: No Stress Concern Present (10/03/2022)   [employer] of Occupational Health - Occupational Stress Questionnaire    Feeling of Stress : Not at all  Social Connections: Socially Isolated (10/03/2022)   Social Connection and Isolation Panel [NHANES]    Frequency of Communication with Friends and Family: Never    Frequency of Social Gatherings with Friends and Family: Once a week    Attends Religious Services: Never    Database administrator or Organizations: No    Attends Engineer, structural: Not on file    Marital Status: Married    There were no vitals filed for this visit. There is no height or weight on file to calculate BMI.  Wt Readings from Last 3 Encounters:  04/02/23 178 lb (80.7 kg)  03/05/23 179 lb (81.2 kg)  02/05/23 179 lb (81.2 kg)    Physical Exam  ASSESSMENT AND PLAN: Ms. KESHONA KARTES was here today annual physical examination.  No orders of the defined types were placed in this encounter.   There are no diagnoses linked to this encounter.  There are no diagnoses linked to this encounter.  No follow-ups on file.  Kaizer Dissinger G. [place], MD  [facility]. [place] office.

## 2023-04-16 ENCOUNTER — Ambulatory Visit (INDEPENDENT_AMBULATORY_CARE_PROVIDER_SITE_OTHER): Payer: Medicare HMO | Admitting: Family Medicine

## 2023-04-16 ENCOUNTER — Encounter: Payer: Self-pay | Admitting: Family Medicine

## 2023-04-16 VITALS — BP 118/76 | HR 67 | Temp 98.2°F | Resp 16 | Ht 62.0 in | Wt 175.5 lb

## 2023-04-16 DIAGNOSIS — E785 Hyperlipidemia, unspecified: Secondary | ICD-10-CM

## 2023-04-16 DIAGNOSIS — Z0000 Encounter for general adult medical examination without abnormal findings: Secondary | ICD-10-CM | POA: Diagnosis not present

## 2023-04-16 DIAGNOSIS — M353 Polymyalgia rheumatica: Secondary | ICD-10-CM

## 2023-04-16 DIAGNOSIS — I700 Atherosclerosis of aorta: Secondary | ICD-10-CM

## 2023-04-16 DIAGNOSIS — R002 Palpitations: Secondary | ICD-10-CM

## 2023-04-16 LAB — LIPID PANEL
Cholesterol: 188 mg/dL (ref 0–200)
HDL: 55.4 mg/dL (ref >39.00)
LDL Cholesterol: 105 mg/dL — ABNORMAL HIGH (ref 0–99)
NonHDL: 133.03
Total CHOL/HDL Ratio: 3
Triglycerides: 141 mg/dL (ref 0.0–149.0)
VLDL: 28.2 mg/dL (ref 0.0–40.0)

## 2023-04-16 LAB — C-REACTIVE PROTEIN: CRP: 2.5 mg/dL (ref 0.5–20.0)

## 2023-04-16 LAB — SEDIMENTATION RATE: Sed Rate: 55 mm/hr — ABNORMAL HIGH (ref 0–30)

## 2023-04-16 NOTE — Assessment & Plan Note (Signed)
She is not interested in pharmacologic treatment, Rosuvastatin 10 mg was recommended. Continue low fat diet. Further recommendations according to FLP result.

## 2023-04-16 NOTE — Assessment & Plan Note (Signed)
She is not on statin medication. Declined Rosuvastatin.

## 2023-04-16 NOTE — Assessment & Plan Note (Signed)
Problem has been going on for sometimes and not frequent. EKG today NSR, normal axis and intervals, poor R progression. No significant changes when compared with EKG done in 09/2018. Instructed about warning signs.

## 2023-04-16 NOTE — Patient Instructions (Addendum)
  Ariel Rios , Thank you for taking time to come for your Medicare Wellness Visit. I appreciate your ongoing commitment to your health goals. Please review the following plan we discussed and let me know if I can assist you in the future.   These are the goals we discussed:  Goals       Acknowledge receipt of Advanced Directive package     Consider completing a power of attorney and living will, forms printed.     Exercise 3x per week (30 min per time)     Continue walking 20 min daily.        This is a list of the screening recommended for you and due dates:  Health Maintenance  Topic Date Due   COVID-19 Vaccine (1) Never done   Flu Shot  04/26/2023   Pap Smear  11/24/2023   Medicare Annual Wellness Visit  04/15/2024   DTaP/Tdap/Td vaccine (3 - Td or Tdap) 11/17/2024   Mammogram  12/11/2024   Colon Cancer Screening  12/29/2024   Pneumonia Vaccine  Completed   DEXA scan (bone density measurement)  Completed   Hepatitis C Screening  Completed   HIV Screening  Completed   Zoster (Shingles) Vaccine  Completed   HPV Vaccine  Aged Out   A few things to remember from today's visit:  Atherosclerosis of aorta (HCC)  Hyperlipidemia, unspecified hyperlipidemia type - Plan: Lipid panel  Welcome to Medicare preventive visit  PMR (polymyalgia rheumatica) (HCC) - Plan: Sedimentation rate, C-reactive protein  Palpitation - Plan: EKG 12-Lead  If you need refills for medications you take chronically, please call your pharmacy. Do not use My Chart to request refills or for acute issues that need immediate attention. If you send a my chart message, it may take a few days to be addressed, specially if I am not in the office.  Please be sure medication list is accurate. If a new problem present, please set up appointment sooner than planned today.

## 2023-04-16 NOTE — Assessment & Plan Note (Signed)
She has not been on Prednisone since 09/2022. She is having some arthralgias but not as severe as when she was Dx'ed and mainly on not axial joints. We discussed indications of Prednisone and side effects.

## 2023-04-29 ENCOUNTER — Emergency Department (HOSPITAL_COMMUNITY)
Admission: EM | Admit: 2023-04-29 | Discharge: 2023-04-30 | Disposition: A | Discharge status: Home / Self Care | Payer: Worker's Compensation | Attending: Emergency Medicine | Admitting: Emergency Medicine

## 2023-04-29 DIAGNOSIS — Z79899 Other long term (current) drug therapy: Secondary | ICD-10-CM | POA: Diagnosis not present

## 2023-04-29 DIAGNOSIS — S52502A Unspecified fracture of the lower end of left radius, initial encounter for closed fracture: Secondary | ICD-10-CM

## 2023-04-29 DIAGNOSIS — S52592A Other fractures of lower end of left radius, initial encounter for closed fracture: Secondary | ICD-10-CM | POA: Insufficient documentation

## 2023-04-29 DIAGNOSIS — Y990 Civilian activity done for income or pay: Secondary | ICD-10-CM | POA: Insufficient documentation

## 2023-04-29 DIAGNOSIS — W01198A Fall on same level from slipping, tripping and stumbling with subsequent striking against other object, initial encounter: Secondary | ICD-10-CM | POA: Diagnosis not present

## 2023-04-29 DIAGNOSIS — Z85820 Personal history of malignant melanoma of skin: Secondary | ICD-10-CM | POA: Insufficient documentation

## 2023-04-29 DIAGNOSIS — S6992XA Unspecified injury of left wrist, hand and finger(s), initial encounter: Secondary | ICD-10-CM | POA: Diagnosis present

## 2023-04-29 NOTE — ED Triage Notes (Signed)
Pt. Arrives pov for L. Wrist pain. Pt. Fell at work also hurting her L. Hip. Pt. States that her hip is not hurting as bad as her wrist. There is swelling to the wrist. Pulse, motor and sensation are intact.

## 2023-04-30 ENCOUNTER — Emergency Department (HOSPITAL_COMMUNITY): Payer: Medicare HMO

## 2023-04-30 ENCOUNTER — Inpatient Hospital Stay: Payer: Medicare HMO | Attending: Gynecologic Oncology

## 2023-04-30 ENCOUNTER — Other Ambulatory Visit: Payer: Self-pay

## 2023-04-30 ENCOUNTER — Encounter (HOSPITAL_COMMUNITY): Payer: Self-pay

## 2023-04-30 ENCOUNTER — Encounter: Payer: Self-pay | Admitting: Hematology and Oncology

## 2023-04-30 ENCOUNTER — Inpatient Hospital Stay: Payer: Medicare HMO

## 2023-04-30 ENCOUNTER — Inpatient Hospital Stay (HOSPITAL_BASED_OUTPATIENT_CLINIC_OR_DEPARTMENT_OTHER): Payer: Medicare HMO | Admitting: Hematology and Oncology

## 2023-04-30 VITALS — BP 119/63 | HR 60 | Resp 18

## 2023-04-30 VITALS — BP 129/73 | HR 73 | Resp 18 | Ht 62.0 in | Wt 177.0 lb

## 2023-04-30 DIAGNOSIS — M353 Polymyalgia rheumatica: Secondary | ICD-10-CM | POA: Insufficient documentation

## 2023-04-30 DIAGNOSIS — S62102A Fracture of unspecified carpal bone, left wrist, initial encounter for closed fracture: Secondary | ICD-10-CM | POA: Diagnosis not present

## 2023-04-30 DIAGNOSIS — Z5112 Encounter for antineoplastic immunotherapy: Secondary | ICD-10-CM | POA: Insufficient documentation

## 2023-04-30 DIAGNOSIS — Z9079 Acquired absence of other genital organ(s): Secondary | ICD-10-CM | POA: Insufficient documentation

## 2023-04-30 DIAGNOSIS — C519 Malignant neoplasm of vulva, unspecified: Secondary | ICD-10-CM

## 2023-04-30 DIAGNOSIS — D539 Nutritional anemia, unspecified: Secondary | ICD-10-CM | POA: Diagnosis not present

## 2023-04-30 DIAGNOSIS — D509 Iron deficiency anemia, unspecified: Secondary | ICD-10-CM | POA: Diagnosis not present

## 2023-04-30 DIAGNOSIS — Z79899 Other long term (current) drug therapy: Secondary | ICD-10-CM | POA: Insufficient documentation

## 2023-04-30 LAB — CBC WITH DIFFERENTIAL (CANCER CENTER ONLY)
Abs Immature Granulocytes: 0.02 10*3/uL (ref 0.00–0.07)
Basophils Absolute: 0 10*3/uL (ref 0.0–0.1)
Basophils Relative: 1 %
Eosinophils Absolute: 0.1 10*3/uL (ref 0.0–0.5)
Eosinophils Relative: 2 %
HCT: 32.7 % — ABNORMAL LOW (ref 36.0–46.0)
Hemoglobin: 11 g/dL — ABNORMAL LOW (ref 12.0–15.0)
Immature Granulocytes: 0 %
Lymphocytes Relative: 26 %
Lymphs Abs: 1.5 10*3/uL (ref 0.7–4.0)
MCH: 29.3 pg (ref 26.0–34.0)
MCHC: 33.6 g/dL (ref 30.0–36.0)
MCV: 87.2 fL (ref 80.0–100.0)
Monocytes Absolute: 0.4 10*3/uL (ref 0.1–1.0)
Monocytes Relative: 7 %
Neutro Abs: 3.7 10*3/uL (ref 1.7–7.7)
Neutrophils Relative %: 64 %
Platelet Count: 295 10*3/uL (ref 150–400)
RBC: 3.75 MIL/uL — ABNORMAL LOW (ref 3.87–5.11)
RDW: 13.8 % (ref 11.5–15.5)
WBC Count: 5.8 10*3/uL (ref 4.0–10.5)
nRBC: 0 % (ref 0.0–0.2)

## 2023-04-30 LAB — CMP (CANCER CENTER ONLY)
ALT: 10 U/L (ref 0–44)
AST: 13 U/L — ABNORMAL LOW (ref 15–41)
Albumin: 4.1 g/dL (ref 3.5–5.0)
Alkaline Phosphatase: 68 U/L (ref 38–126)
Anion gap: 9 (ref 5–15)
BUN: 14 mg/dL (ref 8–23)
CO2: 26 mmol/L (ref 22–32)
Calcium: 9.5 mg/dL (ref 8.9–10.3)
Chloride: 105 mmol/L (ref 98–111)
Creatinine: 0.51 mg/dL (ref 0.44–1.00)
GFR, Estimated: >60 mL/min (ref >60)
Glucose, Bld: 100 mg/dL — ABNORMAL HIGH (ref 70–99)
Potassium: 3.3 mmol/L — ABNORMAL LOW (ref 3.5–5.1)
Sodium: 140 mmol/L (ref 135–145)
Total Bilirubin: 0.8 mg/dL (ref 0.3–1.2)
Total Protein: 6.9 g/dL (ref 6.5–8.1)

## 2023-04-30 LAB — TSH: TSH: 4.536 u[IU]/mL — ABNORMAL HIGH (ref 0.350–4.500)

## 2023-04-30 MED ORDER — OXYCODONE-ACETAMINOPHEN 5-325 MG PO TABS
1.0000 | ORAL_TABLET | Freq: Four times a day (QID) | ORAL | 0 refills | Status: AC | PRN
Start: 2023-04-30 — End: ?

## 2023-04-30 MED ORDER — HEPARIN SOD (PORK) LOCK FLUSH 100 UNIT/ML IV SOLN: 500.0000 [IU] | Freq: Once | INTRAVENOUS | Status: DC | PRN

## 2023-04-30 MED ORDER — SODIUM CHLORIDE 0.9 % IV SOLN
480.0000 mg | Freq: Once | INTRAVENOUS | Status: AC
  Administered 2023-04-30: 480 mg via INTRAVENOUS
  Filled 2023-04-30: qty 48

## 2023-04-30 MED ORDER — SODIUM CHLORIDE 0.9% FLUSH: 10.0000 mL | INTRAVENOUS | Status: DC | PRN

## 2023-04-30 MED ORDER — SODIUM CHLORIDE 0.9 % IV SOLN: Freq: Once | INTRAVENOUS | Status: AC

## 2023-04-30 NOTE — ED Provider Notes (Signed)
[place] EMERGENCY DEPARTMENT AT [facility] Provider Note   CSN: [id number] Arrival date & time: 04/29/23  2342     History  Chief Complaint  Patient presents with   Wrist Pain    Ariel Rios is a 67 y.o. female.   Wrist Pain  Patient presents for fall.  Medical history includes arthritis, anemia, migraine headaches, prediabetes, melanoma, PMR.  Shortly prior to arrival, patient was closing up at work.  There was a wet floor that she slipped on.  She fell forward without fully having a chance to catch herself.  As she fell, she did land on her left wrist and hand area.  She has since had pain and swelling to this area.  She struck her left hip as well but has been ambulatory without any difficulty.  She took ibuprofen for pain prior to arrival.     Home Medications Prior to Admission medications   Medication Sig Start Date End Date Taking? Authorizing Provider  oxyCODONE-acetaminophen (PERCOCET/ROXICET) 5-325 MG tablet Take 1 tablet by mouth every 6 (six) hours as needed for severe pain. 04/30/23  Yes Gloris Manchester, MD  acetaminophen (TYLENOL) 650 MG CR tablet Take 650 mg by mouth every 8 (eight) hours as needed for pain.    [provider]  ammonium lactate (LAC-HYDRIN) 12 % lotion Apply 1 Application topically as needed.    [provider]  Bioflavonoid Products (BIOFLEX) TABS Take 750 each by mouth daily. 2 tablets    [provider]  calcium carbonate (TUMS - DOSED IN MG ELEMENTAL CALCIUM) 500 MG chewable tablet Chew 1-2 tablets by mouth daily as needed for indigestion or heartburn.    [provider]  cholecalciferol (VITAMIN D3) 25 MCG (1000 UNIT) tablet Take 2,000 Units by mouth daily.    [provider]  hydrochlorothiazide (HYDRODIURIL) 25 MG tablet TAKE 1 TABLET BY MOUTH DAILY AS NEEDED. Patient taking differently: Take 25 mg by mouth daily. 06/02/22   [place], [name], MD  ibuprofen (ADVIL) 400 MG tablet Take 400  mg by mouth every 6 (six) hours as needed for moderate pain.    [provider]  KLOR-CON M20 20 MEQ tablet TAKE 1 TABLET BY MOUTH EVERY DAY 03/05/23   [place], [name], MD  Menthol-Methyl Salicylate (SALONPAS PAIN RELIEF PATCH EX) Apply 1 patch topically daily as needed (pain).    [provider]  Multiple Vitamins-Minerals (MULTIVITAMIN WITH MINERALS) tablet Take 1 tablet by mouth daily.    [provider]  prochlorperazine (COMPAZINE) 10 MG tablet Take 1 tablet (10 mg total) by mouth every 6 (six) hours as needed for nausea or vomiting. Patient not taking: Reported on 03/07/2021 02/07/21 05/02/21  Artis Delay, MD      Allergies    Patient has no known allergies.    Review of Systems   Review of Systems  Musculoskeletal:  Positive for arthralgias and joint swelling.  All other systems reviewed and are negative.   Physical Exam Updated Vital Signs BP (!) 142/92 (BP Location: Right Arm)   Pulse 66   Temp 98.2 F (36.8 C) (Oral)   Resp 17   Ht 5\' 2"  (1.575 m)   Wt 80.7 kg   SpO2 100%   BMI 32.56 kg/m  Physical Exam Vitals and nursing note reviewed.  Constitutional:      General: She is not in acute distress.    Appearance: Normal appearance. She is well-developed. She is not ill-appearing, toxic-appearing or diaphoretic.  HENT:     Head: Normocephalic and atraumatic.     Right Ear: External ear normal.     Left Ear: External ear normal.     Nose: Nose normal.     Mouth/Throat:     Mouth: Mucous membranes are moist.  Eyes:     Extraocular Movements: Extraocular movements intact.     Conjunctiva/sclera: Conjunctivae normal.  Cardiovascular:     Rate and Rhythm: Normal rate and regular rhythm.  Pulmonary:     Effort: Pulmonary effort is normal. No respiratory distress.  Abdominal:     General: Abdomen is flat. There is no distension.  Musculoskeletal:        General: Swelling, tenderness and signs of injury present.     Cervical back: Normal range  of motion and neck supple.  Skin:    General: Skin is warm and dry.     Coloration: Skin is not jaundiced or pale.  Neurological:     General: No focal deficit present.     Mental Status: She is alert and oriented to person, place, and time.  Psychiatric:        Mood and Affect: Mood normal.        Behavior: Behavior normal.     ED Results / Procedures / Treatments   Labs (all labs ordered are listed, but only abnormal results are displayed) Labs Reviewed - No data to display  EKG None  Radiology DG Wrist Complete Left  Result Date: 04/30/2023 CLINICAL DATA:  Fall. EXAM: LEFT WRIST - COMPLETE 3+ VIEW; LEFT HAND - COMPLETE 3+ VIEW COMPARISON:  None Available. FINDINGS: There is a transverse fracture of the distal radial metaphysis with minimal displacement. Mild degenerative changes are noted at the interphalangeal joints and wrist. Soft tissue swelling is present about the wrist. IMPRESSION: Minimally displaced fracture of the distal radial metaphysis. Electronically Signed   By: Thornell Sartorius M.D.   On: 04/30/2023 01:23   DG Hand Complete Left  Result Date: 04/30/2023 CLINICAL DATA:  Fall. EXAM: LEFT WRIST - COMPLETE 3+ VIEW; LEFT HAND - COMPLETE 3+ VIEW COMPARISON:  None Available. FINDINGS: There is a transverse fracture of the distal radial metaphysis with minimal displacement. Mild degenerative changes are noted at the interphalangeal joints and wrist. Soft tissue swelling is present about the wrist. IMPRESSION: Minimally displaced fracture of the distal radial metaphysis. Electronically Signed   By: Thornell Sartorius M.D.   On: 04/30/2023 01:23    Procedures Procedures    Medications Ordered in ED Medications - No data to display  ED Course/ Medical Decision Making/ A&P                                 Medical Decision Making Amount and/or Complexity of Data Reviewed Radiology: ordered.   This patient presents to the ED for concern of fall, this involves an extensive  number of treatment options, and is a complaint that carries with it a high risk of complications and morbidity.  The differential diagnosis includes acute injuries   Co morbidities that complicate the patient evaluation  arthritis, anemia, migraine headaches, prediabetes, melanoma, PMR   Additional history obtained:  Additional history obtained from N/A External records from outside source obtained and reviewed including EMR   Imaging Studies ordered:  I ordered imaging studies including x-ray of left hand and wrist I independently visualized and interpreted imaging which showed minimally displaced distal radius fracture I  agree with the radiologist interpretation   Problem List / ED Course / Critical interventions / Medication management  Patient presenting after fall.  During this fall, she did injure her left wrist and hand.  On arrival in the ED, she is alert and oriented.  She significant swelling and pain to area of left wrist as well as the dorsum of her left hand.  She declined any narcotic pain medication.  She did take ibuprofen prior to arrival.  X-ray imaging showed minimally displaced distal radius fracture.  Patient's left ring finger rings were removed in the ED due to concern of pending swelling.  Sugar-tong splint was placed.  Sling was provided.  Patient was advised to follow-up with orthopedic surgery.  She was discharged in stable condition.   Social Determinants of Health:  Has PCP        Final Clinical Impression(s) / ED Diagnoses Final diagnoses:  Closed fracture of distal end of left radius, unspecified fracture morphology, initial encounter    Rx / DC Orders ED Discharge Orders          Ordered    oxyCODONE-acetaminophen (PERCOCET/ROXICET) 5-325 MG tablet  Every 6 hours PRN        04/30/23 0324              Gloris Manchester, MD 04/30/23 [phone]

## 2023-04-30 NOTE — Progress Notes (Signed)
Orthopedic Tech Progress Note Patient Details:  Ariel Rios 02/25/1956 [id number]  Ortho Devices Type of Ortho Device: Arm sling, Sugartong splint Ortho Device/Splint Location: lue Ortho Device/Splint Interventions: Ordered, Application, Adjustment  I applied the splint while the patient held up their arm. Then I applied the sling. Post Interventions Patient Tolerated: Well Instructions Provided: Care of device, Adjustment of device  Trinna Post 04/30/2023, 3:46 AM

## 2023-04-30 NOTE — Assessment & Plan Note (Signed)
Last CT imaging showed excellent response to therapy with complete resolution of previously seen lymphadenopathy Overall, she tolerated treatment quite well The plan will be to continue treatment until the end of the year Plan to repeat imaging study in January

## 2023-04-30 NOTE — Patient Instructions (Signed)
[facility]  Discharge Instructions: Thank you for choosing [facility] to provide your oncology and hematology care.   If you have a lab appointment with the [facility], please go directly to the [facility] and check in at the registration area.   Wear comfortable clothing and clothing appropriate for easy access to any Portacath or PICC line.   We strive to give you quality time with your provider. You may need to reschedule your appointment if you arrive late (15 or more minutes).  Arriving late affects you and other patients whose appointments are after yours.  Also, if you miss three or more appointments without notifying the office, you may be dismissed from the clinic at the provider's discretion.      For prescription refill requests, have your pharmacy contact our office and allow 72 hours for refills to be completed.    Today you received the following chemotherapy and/or immunotherapy agents: Opdivo      To help prevent nausea and vomiting after your treatment, we encourage you to take your nausea medication as directed.  BELOW ARE SYMPTOMS THAT SHOULD BE REPORTED IMMEDIATELY: *FEVER GREATER THAN 100.4 F (38 C) OR HIGHER *CHILLS OR SWEATING *NAUSEA AND VOMITING THAT IS NOT CONTROLLED WITH YOUR NAUSEA MEDICATION *UNUSUAL SHORTNESS OF BREATH *UNUSUAL BRUISING OR BLEEDING *URINARY PROBLEMS (pain or burning when urinating, or frequent urination) *BOWEL PROBLEMS (unusual diarrhea, constipation, pain near the anus) TENDERNESS IN MOUTH AND THROAT WITH OR WITHOUT PRESENCE OF ULCERS (sore throat, sores in mouth, or a toothache) UNUSUAL RASH, SWELLING OR PAIN  UNUSUAL VAGINAL DISCHARGE OR ITCHING   Items with * indicate a potential emergency and should be followed up as soon as possible or go to the Emergency Department if any problems should occur.  Please show the CHEMOTHERAPY ALERT CARD or IMMUNOTHERAPY ALERT CARD at check-in  to the Emergency Department and triage nurse.  Should you have questions after your visit or need to cancel or reschedule your appointment, please contact [facility]  Dept: [phone]  and follow the prompts.  Office hours are 8:00 a.m. to 4:30 p.m. Monday - Friday. Please note that voicemails left after 4:00 p.m. may not be returned until the following business day.  We are closed weekends and major holidays. You have access to a nurse at all times for urgent questions. Please call the main number to the clinic Dept: [phone] and follow the prompts.   For any non-urgent questions, you may also contact your provider using MyChart. We now offer e-Visits for anyone 45 and older to request care online for non-urgent symptoms. For details visit [email or web address].   Also download the MyChart app! Go to the app store, search "MyChart", open the app, select [facility], and log in with your MyChart username and password.

## 2023-04-30 NOTE — Progress Notes (Signed)
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[facility]) Last CT imaging showed excellent response to therapy with complete resolution of previously seen lymphadenopathy Overall, she tolerated treatment quite well The plan will be to continue treatment until the end of the year Plan to repeat imaging study in January  Deficiency anemia This is due to side effects of treatment and flare from polymyalgia rheumatica Observe closely for now We will proceed with treatment without delay    PMR (polymyalgia rheumatica) (HCC) She will continue acetaminophen and ibuprofen as needed  Left wrist fracture I have reviewed her x-ray She is scheduled to see orthopedic surgeon for management Her current treatment will not interfere with wound healing Will defer to orthopedic surgeon for management and we will proceed with treatment without delay  No orders of the defined types were placed in this encounter.   All questions were answered. The patient knows to call the clinic with any problems, questions or concerns. The total time spent in the appointment was 30 minutes encounter with patients including review of chart and various tests results, discussions about plan of care and coordination of care plan   Artis Delay, MD 04/30/2023 9:35 AM  INTERVAL HISTORY: Please see below for problem oriented charting. she returns for treatment follow-up on maintenance nivolumab Since I saw her, unfortunately, she fractured her left wrist yesterday She went to the emergency department and have a cast placed I reviewed evaluation notes as well as x-ray We discussed plan of care She denies major side effects from recent treatment No recent flare of polymyalgia rheumatica or other new side effects  REVIEW OF SYSTEMS:   Constitutional: Denies fevers, chills or abnormal weight loss Eyes: Denies  blurriness of vision Ears, nose, mouth, throat, and face: Denies mucositis or sore throat Respiratory: Denies cough, dyspnea or wheezes Cardiovascular: Denies palpitation, chest discomfort or lower extremity swelling Gastrointestinal:  Denies nausea, heartburn or change in bowel habits Skin: Denies abnormal skin rashes Lymphatics: Denies new lymphadenopathy or easy bruising Neurological:Denies numbness, tingling or new weaknesses Behavioral/Psych: Mood is stable, no new changes  All other systems were reviewed with the patient and are negative.  I have reviewed the past medical history, past surgical history, social history and family history with the patient and they are unchanged from previous note.  ALLERGIES:  has No Known Allergies.  MEDICATIONS:  Current Outpatient Medications  Medication Sig Dispense Refill   acetaminophen (TYLENOL) 650 MG CR tablet Take 650 mg by mouth every 8 (eight) hours as needed for pain.     ammonium lactate (LAC-HYDRIN) 12 % lotion Apply 1 Application topically as needed.     Bioflavonoid Products (BIOFLEX) TABS Take 750 each by mouth daily. 2 tablets     calcium carbonate (TUMS - DOSED IN MG ELEMENTAL CALCIUM) 500 MG chewable tablet Chew 1-2 tablets by mouth daily as needed for indigestion or heartburn.     cholecalciferol (VITAMIN D3) 25 MCG (1000 UNIT) tablet Take 2,000 Units by mouth daily.     hydrochlorothiazide (HYDRODIURIL) 25 MG tablet TAKE 1 TABLET BY MOUTH DAILY AS NEEDED. (Patient taking differently: Take 25 mg by mouth daily.) 90 tablet 3   ibuprofen (ADVIL) 400 MG tablet Take 400 mg by mouth every 6 (six) hours as needed for moderate pain.     KLOR-CON M20 20 MEQ tablet TAKE 1 TABLET BY MOUTH EVERY DAY  90 tablet 1   Menthol-Methyl Salicylate (SALONPAS PAIN RELIEF PATCH EX) Apply 1 patch topically daily as needed (pain).     Multiple Vitamins-Minerals (MULTIVITAMIN WITH MINERALS) tablet Take 1 tablet by mouth daily.     oxyCODONE-acetaminophen  (PERCOCET/ROXICET) 5-325 MG tablet Take 1 tablet by mouth every 6 (six) hours as needed for severe pain. 15 tablet 0   No current facility-administered medications for this visit.   Facility-Administered Medications Ordered in Other Visits  Medication Dose Route Frequency Provider Last Rate Last Admin   heparin lock flush 100 unit/mL  500 Units Intracatheter Once PRN Bertis Ruddy, , MD       nivolumab (OPDIVO) 480 mg in sodium chloride 0.9 % 100 mL chemo infusion  480 mg Intravenous Once Bertis Ruddy, , MD 296 mL/hr at 04/30/23 0934 480 mg at 04/30/23 0934   sodium chloride flush (NS) 0.9 % injection 10 mL  10 mL Intracatheter PRN Artis Delay, MD        SUMMARY OF ONCOLOGIC HISTORY: Oncology History Overview Note  BRAF: undetectable (negative)   Melanoma of vulva (HCC)  10/26/2020 Initial Diagnosis   She was noted to have bloody discharge in her underwear.  She denies pain   12/07/2020 Initial Biopsy   FINAL MICROSCOPIC DIAGNOSIS:   A.   RIGHT VULVA, BIOPSY: PRIMARY MALIGNANT MELANOMA OF VULVA, MELANOMA  TABLE BELOW  MALIGNANT MELANOMA (AJCC 8TH EDITION):  PROCEDURE: BIOPSY  SPECIMEN ANATOMIC SITE: RIGHT VULVA  BRESLOW'S DEPTH/MAXIMUM TUMOR THICKNESS: 5.05 MM  CLARK/ANATOMIC LEVEL* IV  MARGINS:  PERIPHERAL: INVOLVED  DEEP: FREE  ULCERATION: PRESENT  MITOTIC INDEX: 11/mm2  LYMPHO-VASCULAR INVASION: PRESENT*  NEUTROTROPISM: ABSENT  TUMOR-INFILTRATING LYMPHOCYTES: NON-BRISK  LYMPH NODES: N/A  PATHOLOGIC STAGE: PT4B NX MX  *VULVAR MELANOMAS MAY NOT HAVE THE SAME BEHAVIOR STAGE FOR STAGE AS SUPERFICIAL SPREADING CUTANEOUS MELANOMA, AND LANDMARKS SUCH AS BRESLOW'S DEPTH MAY NOT APPLY. NONETHELESS, THIS SHOULD BE TREATED AS A PT4B MELANOMA AND COMPLETE EXCISION, WITH POSSIBLE LYMPH NODE EVALUATION, UNDERTAKEN. SEE COMMENT FOR IHC PROFILE.    COMMENT:  Sections show confluent and nested growth of atypical melanocytes along vulvar epithelium in an in-situ melanoma growth pattern at the  periphery, with a central large ulcerated nodule of similar atypical  melanocytes, consonant with primary vulvar melanoma. Notably, IHC was performed to evaluate the lineage and is diagnostic for melanoma. Lesional melanocytes are positive with S100, MelanA, and show a high ki-67 uptake, and are negative with CDX-2, synaptophysin, CK20, TTF-1, CK7, CD56, and lymphoid markers CD5, CD3, CD20 highlight only background lymphocytes.    12/17/2020 Initial Diagnosis   Melanoma of vulva (HCC)   12/17/2020 Cancer Staging   Staging form: Melanoma of the Skin, AJCC 8th Edition - Clinical stage from 12/17/2020: Stage III (rcT4b, cN2, cM0) - Signed by Artis Delay, MD on 10/13/2022 Stage prefix: Recurrence   12/30/2020 PET scan   1. The only potential hypermetabolic finding of note is a small flat focus of accentuated metabolic activity along the anterior vaginal vestibule/perineum, maximum SUV 6.5. Most common cause for this type of appearance would be a trace amount of incontinence of urinary FDG, but given the proximity to the reported vulvar melanoma, this small focus is considered indeterminate. 2. Other imaging findings of potential clinical significance: Aortic Atherosclerosis (ICD10-I70.0). Small type 1 hiatal hernia. Palatine tonsilloliths. Colonic diverticulosis.   01/18/2021 Surgery   Preoperative Diagnosis: Vulvar melanoma  Postoperative Diagnosis: Same  Procedures performed: Wide radical vulvectomy, R inguinofemoral sentinel LN biopsy  Attending Surgeon: Eugene Garnet, MD  Fellow: Leticia Clas  Resident: Georgana Curio, MD; Sallee Provencal, MD  Anesthesia: General endotracheal  Findings:  1. Vulva with biopsy site changes at R labia minora adjacent to the clitoris. No visible tumor. 2. Inguinofemoral nodes not clinically enlarged or abnormal.   Specimens:  1. R inguinal sentinel LN, hot and blue 2. Portion of vulva 3. Superior deep margin    01/18/2021 Pathology Results    Only melanoma in situ seen in final resection, SLN negative   02/08/2021 - 03/22/2021 Chemotherapy         03/29/2021 Genetic Testing   Negative genetic testing on the Multi-cancer +RNA panel.  The report date is March 29, 2021.  The Multi-Gene Panel offered by [facility] includes sequencing and/or deletion duplication testing of the following 84 genes: AIP, ALK, APC, ATM, AXIN2,BAP1,  BARD1, BLM, BMPR1A, BRCA1, BRCA2, BRIP1, CASR, CDC73, CDH1, CDK4, CDKN1B, CDKN1C, CDKN2A (p14ARF), CDKN2A (p16INK4a), CEBPA, CHEK2, CTNNA1, DICER1, DIS3L2, EGFR (c.2369C>T, p.Thr790Met variant only), EPCAM (Deletion/duplication testing only), FH, FLCN, GATA2, GPC3, GREM1 (Promoter region deletion/duplication testing only), HOXB13 (c.251G>A, p.Gly84Glu), HRAS, KIT, MAX, MEN1, MET, MITF (c.952G>A, p.Glu318Lys variant only), MLH1, MSH2, MSH3, MSH6, MUTYH, NBN, NF1, NF2, NTHL1, PALB2, PDGFRA, PHOX2B, PMS2, POLD1, POLE, POT1, PRKAR1A, PTCH1, PTEN, RAD50, RAD51C, RAD51D, RB1, RECQL4, RET, RUNX1, SDHAF2, SDHA (sequence changes only), SDHB, SDHC, SDHD, SMAD4, SMARCA4, SMARCB1, SMARCE1, STK11, SUFU, TERC, TERT, TMEM127, TP53, TSC1, TSC2, VHL, WRN and WT1.     04/29/2021 PET scan   1. No vulvar mass or residual hypermetabolism. 2. No findings suspicious for metastatic disease.   11/28/2021 Pathology Results   Vulvar biopsy - fibrosis c/w scar. Small junctional nevus. No melanoma identified.   08/31/2022 PET scan   1. Enlarging RIGHT inguinal lymph node suspicious for metastatic disease. This shows only mild to moderate increased metabolic activity but displays FDG uptake greater than blood pool and shows abnormal morphology. Tissue sampling may be helpful. 2. No additional signs of disease in the neck, chest, abdomen or pelvis. 3. Signs of pelvic floor dysfunction and probable cystocele. 4. Chronic fracture of RIGHT inferior pubic ramus signs of healing since previous imaging. 5. Aortic atherosclerosis. 6. Moderate to large hiatal  hernia.   Aortic Atherosclerosis (ICD10-I70.0).   09/04/2022 Pathology Results   FINAL MICROSCOPIC DIAGNOSIS:   A. LYMPH NODE, RIGHT INGUINAL, BIOPSY:  Metastatic melanoma.  See comment.   COMMENT:  The tumor cells are loosely cohesive with foci of cytoplasmic pigment. Immunohistochemistry is positive with Melan-A, S100 and SOX10.  The morphology and immunophenotype are consistent with metastatic melanoma.      09/27/2022 Surgery   Surgery: Excision of enlarged right inguinal lymph node   Operative findings: 2 cm palpable but mobile right inguinal lymph node, hyperpigmented. No other palpable adenopathy.   11/13/2022 -  Chemotherapy   Patient is on Treatment Plan : MELANOMA Nivolumab (480) q28d     01/03/2023 Imaging   1. Enlarged right inguinal lymph node measuring 1.1 cm in short axis, similar to prior PET-CT. 2. No other potential sites of metastatic disease noted elsewhere in the chest, abdomen or pelvis. 3. Moderate-sized hiatal hernia. 4. Colonic diverticulosis without evidence of acute diverticulitis at this time. 5. Aortic atherosclerosis. 6. Additional incidental findings, as above.     03/26/2023 Imaging   CT ABDOMEN PELVIS W CONTRAST  Result Date: 03/27/2023 CLINICAL DATA:  Melanoma, assess treatment response * Tracking Code: BO * EXAM: CT ABDOMEN AND PELVIS WITH CONTRAST TECHNIQUE: Multidetector CT imaging of the abdomen and pelvis  was performed using the standard protocol following bolus administration of intravenous contrast. RADIATION DOSE REDUCTION: This exam was performed according to the departmental dose-optimization program which includes automated exposure control, adjustment of the mA and/or kV according to patient size and/or use of iterative reconstruction technique. CONTRAST:  OMNIPAQUE IOHEXOL 300 MG/ML  SOLN COMPARISON:  CT scan chest, abdomen and pelvis from 01/01/2023. FINDINGS: Lower chest: There are subpleural atelectatic changes in the visualized  lung bases. No overt consolidation. No pleural effusion. The heart is normal in size. No pericardial effusion. 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 Non-cirrhotic configuration. No suspicious mass. Note is made of a lobulated 2.4 x 4.2 cm cyst in the left hepatic lobe, stable since the prior study. There is a stable subcentimeter hypoattenuating focus in the left hepatic lobe, segment 4B, which is too small to adequately characterize but appears unchanged since the prior study and favored benign as well. No intrahepatic or extrahepatic bile duct dilation. No calcified gallstones. Normal gallbladder wall thickness. No pericholecystic inflammatory changes. Pancreas: Unremarkable. No pancreatic ductal dilatation or surrounding inflammatory changes. Spleen: Within normal limits. No focal lesion. Adrenals/Urinary Tract: Adrenal glands are unremarkable. No suspicious renal mass. Redemonstration of several sinus cysts in the left kidney. No hydronephrosis. No renal or ureteric calculi. Urinary bladder is under distended, precluding optimal assessment. However, no large mass or stones identified. No perivesical fat stranding. Stomach/Bowel: No disproportionate dilation of the small or large bowel loops. No evidence of abnormal bowel wall thickening or inflammatory changes. The appendix is unremarkable. There are multiple diverticula throughout the colon, without imaging signs of diverticulitis. There is a small-to-moderate hiatal hernia. Vascular/Lymphatic: No ascites or pneumoperitoneum. No abdominal or pelvic lymphadenopathy, by size criteria. Previously noted right inguinal lymph node (series 2, image 110 of study from 01/01/2023) adjacent to the deepest surgical staple is no longer seen on this exam. There was also another smaller lymph node (series 2, image 112 of study dated 01/01/2023), which is also not seen on today's exam. No aneurysmal  dilation of the major abdominal arteries. Reproductive: The uterus is unremarkable. No large adnexal mass. Other: There is a tiny fat containing umbilical hernia. There are metallic staples and postsurgical changes in the right inguinal region. Musculoskeletal: No suspicious osseous lesions. There are mild multilevel degenerative changes in the visualized spine. IMPRESSION: 1. No evidence of metastatic melanoma in the abdomen or pelvis. Previously noted right inguinal lymph nodes are no longer seen on today's exam. 2. Multiple other nonacute observations, as described above. Electronically Signed   By: Jules Schick M.D.   On: 03/27/2023 15:11        PHYSICAL EXAMINATION: ECOG PERFORMANCE STATUS: 1 - Symptomatic but completely ambulatory  Vitals:   04/30/23 0832  BP: 129/73  Pulse: 73  Resp: 18  SpO2: 97%   Filed Weights   04/30/23 0832  Weight: 177 lb (80.3 kg)    GENERAL:alert, no distress and comfortable NEURO: alert & oriented x 3 with fluent speech, no focal motor/sensory deficits  LABORATORY DATA:  I have reviewed the data as listed    Component Value Date/Time   NA 140 04/30/2023 0807   K 3.3 (L) 04/30/2023 0807   CL 105 04/30/2023 0807   CO2 26 04/30/2023 0807   GLUCOSE 100 (H) 04/30/2023 0807   BUN 14 04/30/2023 0807   CREATININE 0.51 04/30/2023 0807   CREATININE 0.82 08/23/2020 1048   CALCIUM 9.5 04/30/2023 0807  PROT 6.9 04/30/2023 0807   ALBUMIN 4.1 04/30/2023 0807   AST 13 (L) 04/30/2023 0807   ALT 10 04/30/2023 0807   ALKPHOS 68 04/30/2023 0807   BILITOT 0.8 04/30/2023 0807   GFRNONAA >60 04/30/2023 0807   GFRNONAA 77 08/23/2020 1048   GFRAA 89 08/23/2020 1048    No results found for: "SPEP", "UPEP"  Lab Results  Component Value Date   WBC 5.8 04/30/2023   NEUTROABS 3.7 04/30/2023   HGB 11.0 (L) 04/30/2023   HCT 32.7 (L) 04/30/2023   MCV 87.2 04/30/2023   PLT 295 04/30/2023      Chemistry      Component Value Date/Time   NA 140 04/30/2023  0807   K 3.3 (L) 04/30/2023 0807   CL 105 04/30/2023 0807   CO2 26 04/30/2023 0807   BUN 14 04/30/2023 0807   CREATININE 0.51 04/30/2023 0807   CREATININE 0.82 08/23/2020 1048      Component Value Date/Time   CALCIUM 9.5 04/30/2023 0807   ALKPHOS 68 04/30/2023 0807   AST 13 (L) 04/30/2023 0807   ALT 10 04/30/2023 0807   BILITOT 0.8 04/30/2023 0807       RADIOGRAPHIC STUDIES: I have personally reviewed the radiological images as listed and agreed with the findings in the report. DG Wrist Complete Left  Result Date: 04/30/2023 CLINICAL DATA:  Fall. EXAM: LEFT WRIST - COMPLETE 3+ VIEW; LEFT HAND - COMPLETE 3+ VIEW COMPARISON:  None Available. FINDINGS: There is a transverse fracture of the distal radial metaphysis with minimal displacement. Mild degenerative changes are noted at the interphalangeal joints and wrist. Soft tissue swelling is present about the wrist. IMPRESSION: Minimally displaced fracture of the distal radial metaphysis. Electronically Signed   By: Thornell Sartorius M.D.   On: 04/30/2023 01:23   DG Hand Complete Left  Result Date: 04/30/2023 CLINICAL DATA:  Fall. EXAM: LEFT WRIST - COMPLETE 3+ VIEW; LEFT HAND - COMPLETE 3+ VIEW COMPARISON:  None Available. FINDINGS: There is a transverse fracture of the distal radial metaphysis with minimal displacement. Mild degenerative changes are noted at the interphalangeal joints and wrist. Soft tissue swelling is present about the wrist. IMPRESSION: Minimally displaced fracture of the distal radial metaphysis. Electronically Signed   By: Thornell Sartorius M.D.   On: 04/30/2023 01:23

## 2023-04-30 NOTE — Assessment & Plan Note (Signed)
I have reviewed her x-ray She is scheduled to see orthopedic surgeon for management Her current treatment will not interfere with wound healing Will defer to orthopedic surgeon for management and we will proceed with treatment without delay

## 2023-04-30 NOTE — ED Provider Notes (Incomplete)
[place] EMERGENCY DEPARTMENT AT [facility] Provider Note   CSN: [id number] Arrival date & time: 04/29/23  2342     History {Add pertinent medical, surgical, social history, OB history to HPI:1} Chief Complaint  Patient presents with  . Wrist Pain    Ariel Rios is a 67 y.o. female.   Wrist Pain  Patient presents for***.  Medical history includes arthritis, anemia, migraine headaches, prediabetes, melanoma, PMR.     Home Medications Prior to Admission medications   Medication Sig Start Date End Date Taking? Authorizing Provider  acetaminophen (TYLENOL) 650 MG CR tablet Take 650 mg by mouth every 8 (eight) hours as needed for pain.    [provider]  ammonium lactate (LAC-HYDRIN) 12 % lotion Apply 1 Application topically as needed.    [provider]  Bioflavonoid Products (BIOFLEX) TABS Take 750 each by mouth daily. 2 tablets    [provider]  calcium carbonate (TUMS - DOSED IN MG ELEMENTAL CALCIUM) 500 MG chewable tablet Chew 1-2 tablets by mouth daily as needed for indigestion or heartburn.    [provider]  cholecalciferol (VITAMIN D3) 25 MCG (1000 UNIT) tablet Take 2,000 Units by mouth daily.    [provider]  hydrochlorothiazide (HYDRODIURIL) 25 MG tablet TAKE 1 TABLET BY MOUTH DAILY AS NEEDED. Patient taking differently: Take 25 mg by mouth daily. 06/02/22   [place], [name], MD  ibuprofen (ADVIL) 400 MG tablet Take 400 mg by mouth every 6 (six) hours as needed for moderate pain.    [provider]  KLOR-CON M20 20 MEQ tablet TAKE 1 TABLET BY MOUTH EVERY DAY 03/05/23   [place], [name], MD  Menthol-Methyl Salicylate (SALONPAS PAIN RELIEF PATCH EX) Apply 1 patch topically daily as needed (pain).    [provider]  Multiple Vitamins-Minerals (MULTIVITAMIN WITH MINERALS) tablet Take 1 tablet by mouth daily.    [provider]  prochlorperazine (COMPAZINE) 10 MG tablet Take 1 tablet  (10 mg total) by mouth every 6 (six) hours as needed for nausea or vomiting. Patient not taking: Reported on 03/07/2021 02/07/21 05/02/21  Artis Delay, MD      Allergies    Patient has no known allergies.    Review of Systems   Review of Systems  Physical Exam Updated Vital Signs BP (!) 157/97 (BP Location: Right Arm)   Pulse 71   Temp 98.4 F (36.9 C) (Oral)   Resp 17   Ht 5\' 2"  (1.575 m)   Wt 80.7 kg   SpO2 100%   BMI 32.56 kg/m  Physical Exam  ED Results / Procedures / Treatments   Labs (all labs ordered are listed, but only abnormal results are displayed) Labs Reviewed - No data to display  EKG None  Radiology No results found.  Procedures Procedures  {Document cardiac monitor, telemetry assessment procedure when appropriate:1}  Medications Ordered in ED Medications - No data to display  ED Course/ Medical Decision Making/ A&P   {   Click here for ABCD2, HEART and other calculatorsREFRESH Note before signing :1}                              Medical Decision Making  ***  {Document critical care time when appropriate:1} {Document review of labs and clinical decision tools ie heart score, Chads2Vasc2 etc:1}  {Document your independent review of radiology images, and any outside records:1} {Document your discussion with family  members, caretakers, and with consultants:1} {Document social determinants of health affecting pt's care:1} {Document your decision making why or why not admission, treatments were needed:1} Final Clinical Impression(s) / ED Diagnoses Final diagnoses:  None    Rx / DC Orders ED Discharge Orders     None

## 2023-04-30 NOTE — Assessment & Plan Note (Signed)
She will continue acetaminophen and ibuprofen as needed 

## 2023-04-30 NOTE — Assessment & Plan Note (Signed)
This is due to side effects of treatment and flare from polymyalgia rheumatica Observe closely for now We will proceed with treatment without delay

## 2023-04-30 NOTE — Discharge Instructions (Addendum)
Call the telephone number below to set up a follow-up appointment with the orthopedic surgeon.  Take ibuprofen and Tylenol for pain.  A prescription for narcotic pain medication was sent to your pharmacy.  Take this only as needed.

## 2023-05-10 ENCOUNTER — Inpatient Hospital Stay: Payer: Medicare HMO | Admitting: Gynecologic Oncology

## 2023-05-10 ENCOUNTER — Encounter: Payer: Self-pay | Admitting: Gynecologic Oncology

## 2023-05-10 VITALS — BP 136/69 | HR 73 | Temp 98.1°F | Resp 16 | Wt 173.0 lb

## 2023-05-10 DIAGNOSIS — C519 Malignant neoplasm of vulva, unspecified: Secondary | ICD-10-CM | POA: Diagnosis not present

## 2023-05-10 DIAGNOSIS — Z5112 Encounter for antineoplastic immunotherapy: Secondary | ICD-10-CM | POA: Diagnosis not present

## 2023-05-10 NOTE — Patient Instructions (Signed)
It was good to see you today.  I do not see or feel any evidence of cancer recurrence on your exam.  I will see you for follow-up in 3 months.  As always, if you develop any new and concerning symptoms before your next visit, please call to see me sooner.  

## 2023-05-10 NOTE — Progress Notes (Signed)
Gynecologic Oncology Return Clinic Visit  05/10/23  Reason for Visit: follow-up  Treatment History: Oncology History Overview Note  BRAF: undetectable (negative)   Melanoma of vulva (HCC)  10/26/2020 Initial Diagnosis   She was noted to have bloody discharge in her underwear.  She denies pain   12/07/2020 Initial Biopsy   FINAL MICROSCOPIC DIAGNOSIS:   A.   RIGHT VULVA, BIOPSY: PRIMARY MALIGNANT MELANOMA OF VULVA, MELANOMA  TABLE BELOW  MALIGNANT MELANOMA (AJCC 8TH EDITION):  PROCEDURE: BIOPSY  SPECIMEN ANATOMIC SITE: RIGHT VULVA  BRESLOW'S DEPTH/MAXIMUM TUMOR THICKNESS: 5.05 MM  CLARK/ANATOMIC LEVEL* IV  MARGINS:  PERIPHERAL: INVOLVED  DEEP: FREE  ULCERATION: PRESENT  MITOTIC INDEX: 11/mm2  LYMPHO-VASCULAR INVASION: PRESENT*  NEUTROTROPISM: ABSENT  TUMOR-INFILTRATING LYMPHOCYTES: NON-BRISK  LYMPH NODES: N/A  PATHOLOGIC STAGE: PT4B NX MX  *VULVAR MELANOMAS MAY NOT HAVE THE SAME BEHAVIOR STAGE FOR STAGE AS SUPERFICIAL SPREADING CUTANEOUS MELANOMA, AND LANDMARKS SUCH AS BRESLOW'S DEPTH MAY NOT APPLY. NONETHELESS, THIS SHOULD BE TREATED AS A PT4B MELANOMA AND COMPLETE EXCISION, WITH POSSIBLE LYMPH NODE EVALUATION, UNDERTAKEN. SEE COMMENT FOR IHC PROFILE.    COMMENT:  Sections show confluent and nested growth of atypical melanocytes along vulvar epithelium in an in-situ melanoma growth pattern at the periphery, with a central large ulcerated nodule of similar atypical  melanocytes, consonant with primary vulvar melanoma. Notably, IHC was performed to evaluate the lineage and is diagnostic for melanoma. Lesional melanocytes are positive with S100, MelanA, and show a high ki-67 uptake, and are negative with CDX-2, synaptophysin, CK20, TTF-1, CK7, CD56, and lymphoid markers CD5, CD3, CD20 highlight only background lymphocytes.    12/17/2020 Initial Diagnosis   Melanoma of vulva (HCC)   12/17/2020 Cancer Staging   Staging form: Melanoma of the Skin, AJCC 8th Edition - Clinical stage  from 12/17/2020: Stage III (rcT4b, cN2, cM0) - Signed by Artis Delay, MD on 10/13/2022 Stage prefix: Recurrence   12/30/2020 PET scan   1. The only potential hypermetabolic finding of note is a small flat focus of accentuated metabolic activity along the anterior vaginal vestibule/perineum, maximum SUV 6.5. Most common cause for this type of appearance would be a trace amount of incontinence of urinary FDG, but given the proximity to the reported vulvar melanoma, this small focus is considered indeterminate. 2. Other imaging findings of potential clinical significance: Aortic Atherosclerosis (ICD10-I70.0). Small type 1 hiatal hernia. Palatine tonsilloliths. Colonic diverticulosis.   01/18/2021 Surgery   Preoperative Diagnosis: Vulvar melanoma  Postoperative Diagnosis: Same  Procedures performed: Wide radical vulvectomy, R inguinofemoral sentinel LN biopsy  Attending Surgeon: Eugene Garnet, MD  Fellow: Leticia Clas  Resident: Georgana Curio, MD; Sallee Provencal, MD  Anesthesia: General endotracheal  Findings:  1. Vulva with biopsy site changes at R labia minora adjacent to the clitoris. No visible tumor. 2. Inguinofemoral nodes not clinically enlarged or abnormal.   Specimens:  1. R inguinal sentinel LN, hot and blue 2. Portion of vulva 3. Superior deep margin    01/18/2021 Pathology Results   Only melanoma in situ seen in final resection, SLN negative   02/08/2021 - 03/22/2021 Chemotherapy         03/29/2021 Genetic Testing   Negative genetic testing on the Multi-cancer +RNA panel.  The report date is March 29, 2021.  The Multi-Gene Panel offered by [facility] includes sequencing and/or deletion duplication testing of the following 84 genes: AIP, ALK, APC, ATM, AXIN2,BAP1,  BARD1, BLM, BMPR1A, BRCA1, BRCA2, BRIP1, CASR, CDC73, CDH1, CDK4, CDKN1B, CDKN1C, CDKN2A (p14ARF), CDKN2A (p16INK4a), CEBPA,  CHEK2, CTNNA1, DICER1, DIS3L2, EGFR (c.2369C>T, p.Thr790Met variant only), EPCAM  (Deletion/duplication testing only), FH, FLCN, GATA2, GPC3, GREM1 (Promoter region deletion/duplication testing only), HOXB13 (c.251G>A, p.Gly84Glu), HRAS, KIT, MAX, MEN1, MET, MITF (c.952G>A, p.Glu318Lys variant only), MLH1, MSH2, MSH3, MSH6, MUTYH, NBN, NF1, NF2, NTHL1, PALB2, PDGFRA, PHOX2B, PMS2, POLD1, POLE, POT1, PRKAR1A, PTCH1, PTEN, RAD50, RAD51C, RAD51D, RB1, RECQL4, RET, RUNX1, SDHAF2, SDHA (sequence changes only), SDHB, SDHC, SDHD, SMAD4, SMARCA4, SMARCB1, SMARCE1, STK11, SUFU, TERC, TERT, TMEM127, TP53, TSC1, TSC2, VHL, WRN and WT1.     04/29/2021 PET scan   1. No vulvar mass or residual hypermetabolism. 2. No findings suspicious for metastatic disease.   11/28/2021 Pathology Results   Vulvar biopsy - fibrosis c/w scar. Small junctional nevus. No melanoma identified.   08/31/2022 PET scan   1. Enlarging RIGHT inguinal lymph node suspicious for metastatic disease. This shows only mild to moderate increased metabolic activity but displays FDG uptake greater than blood pool and shows abnormal morphology. Tissue sampling may be helpful. 2. No additional signs of disease in the neck, chest, abdomen or pelvis. 3. Signs of pelvic floor dysfunction and probable cystocele. 4. Chronic fracture of RIGHT inferior pubic ramus signs of healing since previous imaging. 5. Aortic atherosclerosis. 6. Moderate to large hiatal hernia.   Aortic Atherosclerosis (ICD10-I70.0).   09/04/2022 Pathology Results   FINAL MICROSCOPIC DIAGNOSIS:   A. LYMPH NODE, RIGHT INGUINAL, BIOPSY:  Metastatic melanoma.  See comment.   COMMENT:  The tumor cells are loosely cohesive with foci of cytoplasmic pigment. Immunohistochemistry is positive with Melan-A, S100 and SOX10.  The morphology and immunophenotype are consistent with metastatic melanoma.      09/27/2022 Surgery   Surgery: Excision of enlarged right inguinal lymph node   Operative findings: 2 cm palpable but mobile right inguinal lymph node, hyperpigmented.  No other palpable adenopathy.   11/13/2022 -  Chemotherapy   Patient is on Treatment Plan : MELANOMA Nivolumab (480) q28d     01/03/2023 Imaging   1. Enlarged right inguinal lymph node measuring 1.1 cm in short axis, similar to prior PET-CT. 2. No other potential sites of metastatic disease noted elsewhere in the chest, abdomen or pelvis. 3. Moderate-sized hiatal hernia. 4. Colonic diverticulosis without evidence of acute diverticulitis at this time. 5. Aortic atherosclerosis. 6. Additional incidental findings, as above.     03/26/2023 Imaging   CT ABDOMEN PELVIS W CONTRAST  Result Date: 03/27/2023 CLINICAL DATA:  Melanoma, assess treatment response * Tracking Code: BO * EXAM: CT ABDOMEN AND PELVIS WITH CONTRAST TECHNIQUE: Multidetector CT imaging of the abdomen and pelvis was performed using the standard protocol following bolus administration of intravenous contrast. RADIATION DOSE REDUCTION: This exam was performed according to the departmental dose-optimization program which includes automated exposure control, adjustment of the mA and/or kV according to patient size and/or use of iterative reconstruction technique. CONTRAST:  OMNIPAQUE IOHEXOL 300 MG/ML  SOLN COMPARISON:  CT scan chest, abdomen and pelvis from 01/01/2023. FINDINGS: Lower chest: There are subpleural atelectatic changes in the visualized lung bases. No overt consolidation. No pleural effusion. The heart is normal in size. No pericardial effusion. 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 Non-cirrhotic configuration. No suspicious mass. Note is made of a lobulated 2.4 x 4.2 cm cyst in the left hepatic lobe, stable since the prior study. There is a stable subcentimeter hypoattenuating focus in the left hepatic lobe, segment 4B, which is too small to adequately characterize but appears unchanged since  the prior study and favored benign as well. No intrahepatic or  extrahepatic bile duct dilation. No calcified gallstones. Normal gallbladder wall thickness. No pericholecystic inflammatory changes. Pancreas: Unremarkable. No pancreatic ductal dilatation or surrounding inflammatory changes. Spleen: Within normal limits. No focal lesion. Adrenals/Urinary Tract: Adrenal glands are unremarkable. No suspicious renal mass. Redemonstration of several sinus cysts in the left kidney. No hydronephrosis. No renal or ureteric calculi. Urinary bladder is under distended, precluding optimal assessment. However, no large mass or stones identified. No perivesical fat stranding. Stomach/Bowel: No disproportionate dilation of the small or large bowel loops. No evidence of abnormal bowel wall thickening or inflammatory changes. The appendix is unremarkable. There are multiple diverticula throughout the colon, without imaging signs of diverticulitis. There is a small-to-moderate hiatal hernia. Vascular/Lymphatic: No ascites or pneumoperitoneum. No abdominal or pelvic lymphadenopathy, by size criteria. Previously noted right inguinal lymph node (series 2, image 110 of study from 01/01/2023) adjacent to the deepest surgical staple is no longer seen on this exam. There was also another smaller lymph node (series 2, image 112 of study dated 01/01/2023), which is also not seen on today's exam. No aneurysmal dilation of the major abdominal arteries. Reproductive: The uterus is unremarkable. No large adnexal mass. Other: There is a tiny fat containing umbilical hernia. There are metallic staples and postsurgical changes in the right inguinal region. Musculoskeletal: No suspicious osseous lesions. There are mild multilevel degenerative changes in the visualized spine. IMPRESSION: 1. No evidence of metastatic melanoma in the abdomen or pelvis. Previously noted right inguinal lymph nodes are no longer seen on today's exam. 2. Multiple other nonacute observations, as described above. Electronically Signed    By: Jules Schick M.D.   On: 03/27/2023 15:11        Interval History: Doing well.  Had tolerating nivo still very well without significant side effects.  Has some pain in her hands.  Recently noticed some possible mild swelling on her right vulva, denies seeing any lesions.  Denies any pain, discharge, bleeding, or pruritus.  Is no longer able to feel right inguinal lymph node.  Past Medical/Surgical History: 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   melanoma   Complication of anesthesia    hard to wake up after anesthesia   Family history of melanoma    History of kidney stones    Melanotic macule of vulvar labia    Migraine    Polymyalgia rheumatica (HCC)    Venous insufficiency     Past Surgical History:  Procedure Laterality Date   Cataract Right 05/22/2022   CATARACT EXTRACTION Left 06/05/2022   COLONOSCOPY     INGUINAL LYMPHADENECTOMY Right 09/27/2022   Procedure: RIGHT INGUINAL LYMPH NODE DEBULKING;  Surgeon: Carver Fila, MD;  Location: [facility];  Service: Gynecology;  Laterality: Right;   VULVECTOMY  01/19/2021   done at [facility], had sentinel node biopsy also    Family History  Problem Relation Age of Onset   Diabetes Mother    AVM Father    Melanoma Father        multiple melanomas dx between 8-90   Cancer Father 39       tumor on kidney   Cancer Paternal Grandfather        laryngeal cancer   Cancer Daughter 19       appendiceal cancer   Breast cancer  Neg Hx    Ovarian cancer Neg Hx    Colon cancer Neg Hx    Uterine cancer Neg Hx    Colon polyps Neg Hx    Esophageal cancer Neg Hx    Stomach cancer Neg Hx    Rectal cancer Neg Hx     Social History   Socioeconomic History   Marital status: Married    Spouse name: Not on file   Number of children: 2   Years of education: Not on file   Highest education level: 12th grade   Occupational History   Occupation: Estate manager/land agent   Occupation: stocking  Tobacco Use   Smoking status: Never   Smokeless tobacco: Never  Vaping Use   Vaping status: Never Used  Substance and Sexual Activity   Alcohol use: Yes    Comment: occasional   Drug use: No   Sexual activity: Not Currently  Other Topics Concern   Not on file  Social History Narrative   Not on file   Social Determinants of Health   Financial Resource Strain: Not on file  Food Insecurity: No Food Insecurity (10/03/2022)   Hunger Vital Sign    Worried About Running Out of Food in the Last Year: Never true    Ran Out of Food in the Last Year: Never true  Transportation Needs: No Transportation Needs (10/03/2022)   PRAPARE - Administrator, Civil Service (Medical): No    Lack of Transportation (Non-Medical): No  Physical Activity: Insufficiently Active (10/03/2022)   Exercise Vital Sign    Days of Exercise per Week: 7 days    Minutes of Exercise per Session: 20 min  Stress: No Stress Concern Present (10/03/2022)   [employer] of Occupational Health - Occupational Stress Questionnaire    Feeling of Stress : Not at all  Social Connections: Socially Isolated (10/03/2022)   Social Connection and Isolation Panel [NHANES]    Frequency of Communication with Friends and Family: Never    Frequency of Social Gatherings with Friends and Family: Once a week    Attends Religious Services: Never    Database administrator or Organizations: No    Attends Engineer, structural: Not on file    Marital Status: Married    Current Medications:  Current Outpatient Medications:    acetaminophen (TYLENOL) 650 MG CR tablet, Take 650 mg by mouth every 8 (eight) hours as needed for pain., Disp: , Rfl:    ammonium lactate (LAC-HYDRIN) 12 % lotion, Apply 1 Application topically as needed., Disp: , Rfl:    Bioflavonoid Products (BIOFLEX) TABS, Take 750 each by mouth daily. 2 tablets, Disp: , Rfl:    calcium  carbonate (TUMS - DOSED IN MG ELEMENTAL CALCIUM) 500 MG chewable tablet, Chew 1-2 tablets by mouth daily as needed for indigestion or heartburn., Disp: , Rfl:    cholecalciferol (VITAMIN D3) 25 MCG (1000 UNIT) tablet, Take 2,000 Units by mouth daily., Disp: , Rfl:    hydrochlorothiazide (HYDRODIURIL) 25 MG tablet, TAKE 1 TABLET BY MOUTH DAILY AS NEEDED. (Patient taking differently: Take 25 mg by mouth daily.), Disp: 90 tablet, Rfl: 3   ibuprofen (ADVIL) 400 MG tablet, Take 400 mg by mouth every 6 (six) hours as needed for moderate pain., Disp: , Rfl:    KLOR-CON M20 20 MEQ tablet, TAKE 1 TABLET BY MOUTH EVERY DAY, Disp: 90 tablet, Rfl: 1   Menthol-Methyl Salicylate (SALONPAS PAIN RELIEF PATCH EX), Apply 1 patch topically daily as needed (  pain)., Disp: , Rfl:    Multiple Vitamins-Minerals (MULTIVITAMIN WITH MINERALS) tablet, Take 1 tablet by mouth daily., Disp: , Rfl:    oxyCODONE-acetaminophen (PERCOCET/ROXICET) 5-325 MG tablet, Take 1 tablet by mouth every 6 (six) hours as needed for severe pain., Disp: 15 tablet, Rfl: 0  Review of Systems: Denies appetite changes, fevers, chills, fatigue, unexplained weight changes. Denies hearing loss, neck lumps or masses, mouth sores, ringing in ears or voice changes. Denies cough or wheezing.  Denies shortness of breath. Denies chest pain or palpitations. Denies leg swelling. Denies abdominal distention, pain, blood in stools, constipation, diarrhea, nausea, vomiting, or early satiety. Denies pain with intercourse, dysuria, frequency, hematuria or incontinence. Denies hot flashes, pelvic pain, vaginal bleeding or vaginal discharge.   Denies joint pain, back pain or muscle pain/cramps. Denies itching, rash, or wounds. Denies dizziness, headaches, numbness or seizures. Denies swollen lymph nodes or glands, denies easy bruising or bleeding. Denies anxiety, depression, confusion, or decreased concentration.  Physical Exam: BP 136/69 (BP Location: Left Arm,  Patient Position: Sitting)   Pulse 73   Temp 98.1 F (36.7 C) (Oral)   Resp 16   Wt 173 lb (78.5 kg)   SpO2 98%   BMI 31.64 kg/m  General: Alert, oriented, no acute distress. HEENT: Normocephalic, atraumatic, sclera anicteric. Chest: Clear to auscultation bilaterally.  No wheezes or rhonchi. Cardiovascular: Regular rate and rhythm, no murmurs. Abdomen: soft, nontender.  Normoactive bowel sounds.  No masses or hepatosplenomegaly appreciated.   Extremities: Grossly normal range of motion.  Warm, well perfused.  No edema bilaterally. Skin: No rashes or lesions noted. Lymphatics: No cervical, supraclavicular, or inguinal adenopathy. GU: External female genitalia notable for changes from prior surgery along the right vulva.  There is mild discoloration along the most inferior aspect of the incision, similar to what is has been.   No other areas of discoloration, lesions, or atypical vasculature.   No new lesions.  Moderate atrophy of the vulva, loss of posterior labia bilaterally.  Laboratory & Radiologic Studies: CT A/P on 03/26/23: 1. No evidence of metastatic melanoma in the abdomen or pelvis. Previously noted right inguinal lymph nodes are no longer seen on today's exam. 2. Multiple other nonacute observations, as described above.  Assessment & Plan: Ariel Rios is a 67 y.o. woman with recurrent vulvar melanoma, treated with excision of inguinal lymph node (09/2022), now on nivolumab (started in 10/2022).  Imaging in 12/2022 with enlarged right inguinal lymph node, resolved as of 03/2023 imaging.    The patient is overall doing well, tolerating immunotherapy better than she did after her initial diagnosis when she was on pembrolizumab.  She received cycle 7 of nivolumab on 8/5.  Recent CT scan resolution of previously noted enlarged right inguinal lymph node, no evidence of metastatic disease.    No findings on the vulva concerning for disease recurrence.   I will plan to see the  patient back for 28-month follow-up and exam.  20 minutes of total time was spent for this patient encounter, including preparation, face-to-face counseling with the patient and coordination of care, and documentation of the encounter.  Eugene Garnet, MD  Division of Gynecologic Oncology  Department of Obstetrics and Gynecology  [facility]

## 2023-05-12 ENCOUNTER — Other Ambulatory Visit: Payer: Self-pay

## 2023-05-15 HISTORY — PX: WRIST SURGERY: SHX841

## 2023-05-29 ENCOUNTER — Inpatient Hospital Stay (HOSPITAL_BASED_OUTPATIENT_CLINIC_OR_DEPARTMENT_OTHER): Payer: Medicare HMO | Admitting: Hematology and Oncology

## 2023-05-29 ENCOUNTER — Inpatient Hospital Stay: Payer: Medicare HMO

## 2023-05-29 ENCOUNTER — Inpatient Hospital Stay: Payer: Medicare HMO | Attending: Gynecologic Oncology

## 2023-05-29 ENCOUNTER — Encounter: Payer: Self-pay | Admitting: Hematology and Oncology

## 2023-05-29 VITALS — BP 109/64 | HR 60 | Resp 16

## 2023-05-29 VITALS — BP 133/76 | HR 82 | Temp 97.4°F | Resp 18 | Ht 62.0 in | Wt 178.0 lb

## 2023-05-29 DIAGNOSIS — D539 Nutritional anemia, unspecified: Secondary | ICD-10-CM | POA: Diagnosis not present

## 2023-05-29 DIAGNOSIS — Z5112 Encounter for antineoplastic immunotherapy: Secondary | ICD-10-CM | POA: Diagnosis present

## 2023-05-29 DIAGNOSIS — C519 Malignant neoplasm of vulva, unspecified: Secondary | ICD-10-CM | POA: Diagnosis not present

## 2023-05-29 DIAGNOSIS — M353 Polymyalgia rheumatica: Secondary | ICD-10-CM

## 2023-05-29 DIAGNOSIS — S62102D Fracture of unspecified carpal bone, left wrist, subsequent encounter for fracture with routine healing: Secondary | ICD-10-CM

## 2023-05-29 DIAGNOSIS — Z79620 Long term (current) use of immunosuppressive biologic: Secondary | ICD-10-CM | POA: Diagnosis not present

## 2023-05-29 DIAGNOSIS — S62102A Fracture of unspecified carpal bone, left wrist, initial encounter for closed fracture: Secondary | ICD-10-CM

## 2023-05-29 LAB — CBC WITH DIFFERENTIAL (CANCER CENTER ONLY)
Abs Immature Granulocytes: 0.01 10*3/uL (ref 0.00–0.07)
Basophils Absolute: 0 10*3/uL (ref 0.0–0.1)
Basophils Relative: 1 %
Eosinophils Absolute: 0.2 10*3/uL (ref 0.0–0.5)
Eosinophils Relative: 4 %
HCT: 35.1 % — ABNORMAL LOW (ref 36.0–46.0)
Hemoglobin: 11.7 g/dL — ABNORMAL LOW (ref 12.0–15.0)
Immature Granulocytes: 0 %
Lymphocytes Relative: 26 %
Lymphs Abs: 1.3 10*3/uL (ref 0.7–4.0)
MCH: 29.4 pg (ref 26.0–34.0)
MCHC: 33.3 g/dL (ref 30.0–36.0)
MCV: 88.2 fL (ref 80.0–100.0)
Monocytes Absolute: 0.3 10*3/uL (ref 0.1–1.0)
Monocytes Relative: 6 %
Neutro Abs: 3.2 10*3/uL (ref 1.7–7.7)
Neutrophils Relative %: 63 %
Platelet Count: 305 10*3/uL (ref 150–400)
RBC: 3.98 MIL/uL (ref 3.87–5.11)
RDW: 13.6 % (ref 11.5–15.5)
WBC Count: 5 10*3/uL (ref 4.0–10.5)
nRBC: 0 % (ref 0.0–0.2)

## 2023-05-29 LAB — CMP (CANCER CENTER ONLY)
ALT: 11 U/L (ref 0–44)
AST: 12 U/L — ABNORMAL LOW (ref 15–41)
Albumin: 4.2 g/dL (ref 3.5–5.0)
Alkaline Phosphatase: 65 U/L (ref 38–126)
Anion gap: 10 (ref 5–15)
BUN: 19 mg/dL (ref 8–23)
CO2: 27 mmol/L (ref 22–32)
Calcium: 9.8 mg/dL (ref 8.9–10.3)
Chloride: 105 mmol/L (ref 98–111)
Creatinine: 0.54 mg/dL (ref 0.44–1.00)
GFR, Estimated: >60 mL/min (ref >60)
Glucose, Bld: 105 mg/dL — ABNORMAL HIGH (ref 70–99)
Potassium: 3.3 mmol/L — ABNORMAL LOW (ref 3.5–5.1)
Sodium: 142 mmol/L (ref 135–145)
Total Bilirubin: 0.7 mg/dL (ref 0.3–1.2)
Total Protein: 7.2 g/dL (ref 6.5–8.1)

## 2023-05-29 LAB — TSH: TSH: 3.018 u[IU]/mL (ref 0.350–4.500)

## 2023-05-29 MED ORDER — SODIUM CHLORIDE 0.9 % IV SOLN: Freq: Once | INTRAVENOUS | Status: AC

## 2023-05-29 MED ORDER — SODIUM CHLORIDE 0.9 % IV SOLN
480.0000 mg | Freq: Once | INTRAVENOUS | Status: AC
  Administered 2023-05-29: 480 mg via INTRAVENOUS
  Filled 2023-05-29: qty 48

## 2023-05-29 NOTE — Assessment & Plan Note (Signed)
 This is due to side effects of treatment and flare from polymyalgia rheumatica Observe closely for now We will proceed with treatment without delay

## 2023-05-29 NOTE — Progress Notes (Signed)
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[facility]) Last CT imaging showed excellent response to therapy with complete resolution of previously seen lymphadenopathy Overall, she tolerated treatment quite well The plan will be to continue treatment until the end of the year Plan to repeat imaging study in January  PMR (polymyalgia rheumatica) (HCC) She will continue acetaminophen and ibuprofen as needed  Left wrist fracture Her orthopedic surgeon is planning surgery this month There is no contraindication for her to proceed with surgery as immunotherapy should not affect wound healing  Deficiency anemia This is due to side effects of treatment and flare from polymyalgia rheumatica Observe closely for now We will proceed with treatment without delay    No orders of the defined types were placed in this encounter.   All questions were answered. The patient knows to call the clinic with any problems, questions or concerns. The total time spent in the appointment was 30 minutes encounter with patients including review of chart and various tests results, discussions about plan of care and coordination of care plan   Artis Delay, MD 05/29/2023 10:09 AM  INTERVAL HISTORY: Please see below for problem oriented charting. she returns for treatment follow-up she tolerated last cycle therapy well However, she would need surgery to fix her broken wrist sometime this month She denies exacerbation of her pain while on recent treatment No recent infection She have questions related to screening programs  REVIEW OF SYSTEMS:   Constitutional: Denies fevers, chills or abnormal weight loss Eyes: Denies blurriness of vision Ears, nose, mouth, throat, and face: Denies mucositis or sore throat Respiratory: Denies cough, dyspnea or wheezes Cardiovascular: Denies palpitation, chest  discomfort or lower extremity swelling Gastrointestinal:  Denies nausea, heartburn or change in bowel habits Skin: Denies abnormal skin rashes Lymphatics: Denies new lymphadenopathy or easy bruising Neurological:Denies numbness, tingling or new weaknesses Behavioral/Psych: Mood is stable, no new changes  All other systems were reviewed with the patient and are negative.  I have reviewed the past medical history, past surgical history, social history and family history with the patient and they are unchanged from previous note.  ALLERGIES:  has No Known Allergies.  MEDICATIONS:  Current Outpatient Medications  Medication Sig Dispense Refill   Ascorbic Acid (VITAMIN C) 1000 MG tablet Take 1,000 mg by mouth daily.     Pyridoxine HCl (VITAMIN B6) 100 MG TABS Take 100 mg by mouth daily.     acetaminophen (TYLENOL) 650 MG CR tablet Take 650 mg by mouth every 8 (eight) hours as needed for pain.     ammonium lactate (LAC-HYDRIN) 12 % lotion Apply 1 Application topically as needed.     Bioflavonoid Products (BIOFLEX) TABS Take 750 each by mouth daily. 2 tablets     calcium carbonate (TUMS - DOSED IN MG ELEMENTAL CALCIUM) 500 MG chewable tablet Chew 1-2 tablets by mouth daily as needed for indigestion or heartburn.     cholecalciferol (VITAMIN D3) 25 MCG (1000 UNIT) tablet Take 2,000 Units by mouth daily.     hydrochlorothiazide (HYDRODIURIL) 25 MG tablet TAKE 1 TABLET BY MOUTH DAILY AS NEEDED. (Patient taking differently: Take 25 mg by mouth daily.) 90 tablet 3   ibuprofen (ADVIL) 400 MG tablet Take 400 mg by mouth every 6 (six) hours as needed for moderate pain.     KLOR-CON M20 20 MEQ tablet TAKE 1 TABLET BY  MOUTH EVERY DAY 90 tablet 1   Menthol-Methyl Salicylate (SALONPAS PAIN RELIEF PATCH EX) Apply 1 patch topically daily as needed (pain).     Multiple Vitamins-Minerals (MULTIVITAMIN WITH MINERALS) tablet Take 1 tablet by mouth daily.     oxyCODONE-acetaminophen (PERCOCET/ROXICET) 5-325 MG  tablet Take 1 tablet by mouth every 6 (six) hours as needed for severe pain. 15 tablet 0   No current facility-administered medications for this visit.   Facility-Administered Medications Ordered in Other Visits  Medication Dose Route Frequency Provider Last Rate Last Admin   nivolumab (OPDIVO) 480 mg in sodium chloride 0.9 % 100 mL chemo infusion  480 mg Intravenous Once Artis Delay, MD 296 mL/hr at 05/29/23 0944 480 mg at 05/29/23 0944    SUMMARY OF ONCOLOGIC HISTORY: Oncology History Overview Note  BRAF: undetectable (negative)   Melanoma of vulva (HCC)  10/26/2020 Initial Diagnosis   She was noted to have bloody discharge in her underwear.  She denies pain   12/07/2020 Initial Biopsy   FINAL MICROSCOPIC DIAGNOSIS:   A.   RIGHT VULVA, BIOPSY: PRIMARY MALIGNANT MELANOMA OF VULVA, MELANOMA  TABLE BELOW  MALIGNANT MELANOMA (AJCC 8TH EDITION):  PROCEDURE: BIOPSY  SPECIMEN ANATOMIC SITE: RIGHT VULVA  BRESLOW'S DEPTH/MAXIMUM TUMOR THICKNESS: 5.05 MM  CLARK/ANATOMIC LEVEL* IV  MARGINS:  PERIPHERAL: INVOLVED  DEEP: FREE  ULCERATION: PRESENT  MITOTIC INDEX: 11/mm2  LYMPHO-VASCULAR INVASION: PRESENT*  NEUTROTROPISM: ABSENT  TUMOR-INFILTRATING LYMPHOCYTES: NON-BRISK  LYMPH NODES: N/A  PATHOLOGIC STAGE: PT4B NX MX  *VULVAR MELANOMAS MAY NOT HAVE THE SAME BEHAVIOR STAGE FOR STAGE AS SUPERFICIAL SPREADING CUTANEOUS MELANOMA, AND LANDMARKS SUCH AS BRESLOW'S DEPTH MAY NOT APPLY. NONETHELESS, THIS SHOULD BE TREATED AS A PT4B MELANOMA AND COMPLETE EXCISION, WITH POSSIBLE LYMPH NODE EVALUATION, UNDERTAKEN. SEE COMMENT FOR IHC PROFILE.    COMMENT:  Sections show confluent and nested growth of atypical melanocytes along vulvar epithelium in an in-situ melanoma growth pattern at the periphery, with a central large ulcerated nodule of similar atypical  melanocytes, consonant with primary vulvar melanoma. Notably, IHC was performed to evaluate the lineage and is diagnostic for melanoma. Lesional  melanocytes are positive with S100, MelanA, and show a high ki-67 uptake, and are negative with CDX-2, synaptophysin, CK20, TTF-1, CK7, CD56, and lymphoid markers CD5, CD3, CD20 highlight only background lymphocytes.    12/17/2020 Initial Diagnosis   Melanoma of vulva (HCC)   12/17/2020 Cancer Staging   Staging form: Melanoma of the Skin, AJCC 8th Edition - Clinical stage from 12/17/2020: Stage III (rcT4b, cN2, cM0) - Signed by Artis Delay, MD on 10/13/2022 Stage prefix: Recurrence   12/30/2020 PET scan   1. The only potential hypermetabolic finding of note is a small flat focus of accentuated metabolic activity along the anterior vaginal vestibule/perineum, maximum SUV 6.5. Most common cause for this type of appearance would be a trace amount of incontinence of urinary FDG, but given the proximity to the reported vulvar melanoma, this small focus is considered indeterminate. 2. Other imaging findings of potential clinical significance: Aortic Atherosclerosis (ICD10-I70.0). Small type 1 hiatal hernia. Palatine tonsilloliths. Colonic diverticulosis.   01/18/2021 Surgery   Preoperative Diagnosis: Vulvar melanoma  Postoperative Diagnosis: Same  Procedures performed: Wide radical vulvectomy, R inguinofemoral sentinel LN biopsy  Attending Surgeon: Eugene Garnet, MD  Fellow: Leticia Clas  Resident: Georgana Curio, MD; Sallee Provencal, MD  Anesthesia: General endotracheal  Findings:  1. Vulva with biopsy site changes at R labia minora adjacent to the clitoris. No visible tumor. 2. Inguinofemoral nodes not  clinically enlarged or abnormal.   Specimens:  1. R inguinal sentinel LN, hot and blue 2. Portion of vulva 3. Superior deep margin    01/18/2021 Pathology Results   Only melanoma in situ seen in final resection, SLN negative   02/08/2021 - 03/22/2021 Chemotherapy         03/29/2021 Genetic Testing   Negative genetic testing on the Multi-cancer +RNA panel.  The report date is  March 29, 2021.  The Multi-Gene Panel offered by [facility] includes sequencing and/or deletion duplication testing of the following 84 genes: AIP, ALK, APC, ATM, AXIN2,BAP1,  BARD1, BLM, BMPR1A, BRCA1, BRCA2, BRIP1, CASR, CDC73, CDH1, CDK4, CDKN1B, CDKN1C, CDKN2A (p14ARF), CDKN2A (p16INK4a), CEBPA, CHEK2, CTNNA1, DICER1, DIS3L2, EGFR (c.2369C>T, p.Thr790Met variant only), EPCAM (Deletion/duplication testing only), FH, FLCN, GATA2, GPC3, GREM1 (Promoter region deletion/duplication testing only), HOXB13 (c.251G>A, p.Gly84Glu), HRAS, KIT, MAX, MEN1, MET, MITF (c.952G>A, p.Glu318Lys variant only), MLH1, MSH2, MSH3, MSH6, MUTYH, NBN, NF1, NF2, NTHL1, PALB2, PDGFRA, PHOX2B, PMS2, POLD1, POLE, POT1, PRKAR1A, PTCH1, PTEN, RAD50, RAD51C, RAD51D, RB1, RECQL4, RET, RUNX1, SDHAF2, SDHA (sequence changes only), SDHB, SDHC, SDHD, SMAD4, SMARCA4, SMARCB1, SMARCE1, STK11, SUFU, TERC, TERT, TMEM127, TP53, TSC1, TSC2, VHL, WRN and WT1.     04/29/2021 PET scan   1. No vulvar mass or residual hypermetabolism. 2. No findings suspicious for metastatic disease.   11/28/2021 Pathology Results   Vulvar biopsy - fibrosis c/w scar. Small junctional nevus. No melanoma identified.   08/31/2022 PET scan   1. Enlarging RIGHT inguinal lymph node suspicious for metastatic disease. This shows only mild to moderate increased metabolic activity but displays FDG uptake greater than blood pool and shows abnormal morphology. Tissue sampling may be helpful. 2. No additional signs of disease in the neck, chest, abdomen or pelvis. 3. Signs of pelvic floor dysfunction and probable cystocele. 4. Chronic fracture of RIGHT inferior pubic ramus signs of healing since previous imaging. 5. Aortic atherosclerosis. 6. Moderate to large hiatal hernia.   Aortic Atherosclerosis (ICD10-I70.0).   09/04/2022 Pathology Results   FINAL MICROSCOPIC DIAGNOSIS:   A. LYMPH NODE, RIGHT INGUINAL, BIOPSY:  Metastatic melanoma.  See comment.   COMMENT:  The  tumor cells are loosely cohesive with foci of cytoplasmic pigment. Immunohistochemistry is positive with Melan-A, S100 and SOX10.  The morphology and immunophenotype are consistent with metastatic melanoma.      09/27/2022 Surgery   Surgery: Excision of enlarged right inguinal lymph node   Operative findings: 2 cm palpable but mobile right inguinal lymph node, hyperpigmented. No other palpable adenopathy.   11/13/2022 -  Chemotherapy   Patient is on Treatment Plan : MELANOMA Nivolumab (480) q28d     01/03/2023 Imaging   1. Enlarged right inguinal lymph node measuring 1.1 cm in short axis, similar to prior PET-CT. 2. No other potential sites of metastatic disease noted elsewhere in the chest, abdomen or pelvis. 3. Moderate-sized hiatal hernia. 4. Colonic diverticulosis without evidence of acute diverticulitis at this time. 5. Aortic atherosclerosis. 6. Additional incidental findings, as above.     03/26/2023 Imaging   CT ABDOMEN PELVIS W CONTRAST  Result Date: 03/27/2023 CLINICAL DATA:  Melanoma, assess treatment response * Tracking Code: BO * EXAM: CT ABDOMEN AND PELVIS WITH CONTRAST TECHNIQUE: Multidetector CT imaging of the abdomen and pelvis was performed using the standard protocol following bolus administration of intravenous contrast. RADIATION DOSE REDUCTION: This exam was performed according to the departmental dose-optimization program which includes automated exposure control, adjustment of the mA and/or kV according to patient size  and/or use of iterative reconstruction technique. CONTRAST:  OMNIPAQUE IOHEXOL 300 MG/ML  SOLN COMPARISON:  CT scan chest, abdomen and pelvis from 01/01/2023. FINDINGS: Lower chest: There are subpleural atelectatic changes in the visualized lung bases. No overt consolidation. No pleural effusion. The heart is normal in size. No pericardial effusion. Hepatobiliary: There is elongated right hepatic lobe, likely representing anatomic variant of Riedel's  lobe. Non-cirrhotic configuration. No suspicious mass. Note is made of a lobulated 2.4 x 4.2 cm cyst in the left hepatic lobe, stable since the prior study. There is a stable subcentimeter hypoattenuating focus in the left hepatic lobe, segment 4B, which is too small to adequately characterize but appears unchanged since the prior study and favored benign as well. No intrahepatic or extrahepatic bile duct dilation. No calcified gallstones. Normal gallbladder wall thickness. No pericholecystic inflammatory changes. Pancreas: Unremarkable. No pancreatic ductal dilatation or surrounding inflammatory changes. Spleen: Within normal limits. No focal lesion. Adrenals/Urinary Tract: Adrenal glands are unremarkable. No suspicious renal mass. Redemonstration of several sinus cysts in the left kidney. No hydronephrosis. No renal or ureteric calculi. Urinary bladder is under distended, precluding optimal assessment. However, no large mass or stones identified. No perivesical fat stranding. Stomach/Bowel: No disproportionate dilation of the small or large bowel loops. No evidence of abnormal bowel wall thickening or inflammatory changes. The appendix is unremarkable. There are multiple diverticula throughout the colon, without imaging signs of diverticulitis. There is a small-to-moderate hiatal hernia. Vascular/Lymphatic: No ascites or pneumoperitoneum. No abdominal or pelvic lymphadenopathy, by size criteria. Previously noted right inguinal lymph node (series 2, image 110 of study from 01/01/2023) adjacent to the deepest surgical staple is no longer seen on this exam. There was also another smaller lymph node (series 2, image 112 of study dated 01/01/2023), which is also not seen on today's exam. No aneurysmal dilation of the major abdominal arteries. Reproductive: The uterus is unremarkable. No large adnexal mass. Other: There is a tiny fat containing umbilical hernia. There are metallic staples and postsurgical changes in the  right inguinal region. Musculoskeletal: No suspicious osseous lesions. There are mild multilevel degenerative changes in the visualized spine. IMPRESSION: 1. No evidence of metastatic melanoma in the abdomen or pelvis. Previously noted right inguinal lymph nodes are no longer seen on today's exam. 2. Multiple other nonacute observations, as described above. Electronically Signed   By: Jules Schick M.D.   On: 03/27/2023 15:11        PHYSICAL EXAMINATION: ECOG PERFORMANCE STATUS: 1 - Symptomatic but completely ambulatory  Vitals:   05/29/23 0850  BP: 133/76  Pulse: 82  Resp: 18  Temp: (!) 97.4 F (36.3 C)  SpO2: 100%   Filed Weights   05/29/23 0850  Weight: 178 lb (80.7 kg)    GENERAL:alert, no distress and comfortable NEURO: alert & oriented x 3 with fluent speech, no focal motor/sensory deficits  LABORATORY DATA:  I have reviewed the data as listed    Component Value Date/Time   NA 142 05/29/2023 0823   K 3.3 (L) 05/29/2023 0823   CL 105 05/29/2023 0823   CO2 27 05/29/2023 0823   GLUCOSE 105 (H) 05/29/2023 0823   BUN 19 05/29/2023 0823   CREATININE 0.54 05/29/2023 0823   CREATININE 0.82 08/23/2020 1048   CALCIUM 9.8 05/29/2023 0823   PROT 7.2 05/29/2023 0823   ALBUMIN 4.2 05/29/2023 0823   AST 12 (L) 05/29/2023 0823   ALT 11 05/29/2023 0823   ALKPHOS 65 05/29/2023 0823   BILITOT 0.7  05/29/2023 0823   GFRNONAA >60 05/29/2023 0823   GFRNONAA 77 08/23/2020 1048   GFRAA 89 08/23/2020 1048    No results found for: "SPEP", "UPEP"  Lab Results  Component Value Date   WBC 5.0 05/29/2023   NEUTROABS 3.2 05/29/2023   HGB 11.7 (L) 05/29/2023   HCT 35.1 (L) 05/29/2023   MCV 88.2 05/29/2023   PLT 305 05/29/2023      Chemistry      Component Value Date/Time   NA 142 05/29/2023 0823   K 3.3 (L) 05/29/2023 0823   CL 105 05/29/2023 0823   CO2 27 05/29/2023 0823   BUN 19 05/29/2023 0823   CREATININE 0.54 05/29/2023 0823   CREATININE 0.82 08/23/2020 1048       Component Value Date/Time   CALCIUM 9.8 05/29/2023 0823   ALKPHOS 65 05/29/2023 0823   AST 12 (L) 05/29/2023 0823   ALT 11 05/29/2023 0823   BILITOT 0.7 05/29/2023 1610

## 2023-05-29 NOTE — Patient Instructions (Signed)
[facility]  Discharge Instructions: Thank you for choosing [facility] to provide your oncology and hematology care.   If you have a lab appointment with the Cancer Center, please go directly to the Cancer Center and check in at the registration area.   Wear comfortable clothing and clothing appropriate for easy access to any Portacath or PICC line.   We strive to give you quality time with your provider. You may need to reschedule your appointment if you arrive late (15 or more minutes).  Arriving late affects you and other patients whose appointments are after yours.  Also, if you miss three or more appointments without notifying the office, you may be dismissed from the clinic at the provider's discretion.      For prescription refill requests, have your pharmacy contact our office and allow 72 hours for refills to be completed.    Today you received the following chemotherapy and/or immunotherapy agents: Nivolumab (Opdivo)      To help prevent nausea and vomiting after your treatment, we encourage you to take your nausea medication as directed.  BELOW ARE SYMPTOMS THAT SHOULD BE REPORTED IMMEDIATELY: *FEVER GREATER THAN 100.4 F (38 C) OR HIGHER *CHILLS OR SWEATING *NAUSEA AND VOMITING THAT IS NOT CONTROLLED WITH YOUR NAUSEA MEDICATION *UNUSUAL SHORTNESS OF BREATH *UNUSUAL BRUISING OR BLEEDING *URINARY PROBLEMS (pain or burning when urinating, or frequent urination) *BOWEL PROBLEMS (unusual diarrhea, constipation, pain near the anus) TENDERNESS IN MOUTH AND THROAT WITH OR WITHOUT PRESENCE OF ULCERS (sore throat, sores in mouth, or a toothache) UNUSUAL RASH, SWELLING OR PAIN  UNUSUAL VAGINAL DISCHARGE OR ITCHING   Items with * indicate a potential emergency and should be followed up as soon as possible or go to the Emergency Department if any problems should occur.  Please show the CHEMOTHERAPY ALERT CARD or IMMUNOTHERAPY ALERT CARD  at check-in to the Emergency Department and triage nurse.  Should you have questions after your visit or need to cancel or reschedule your appointment, please contact [facility]  Dept: [phone]  and follow the prompts.  Office hours are 8:00 a.m. to 4:30 p.m. Monday - Friday. Please note that voicemails left after 4:00 p.m. may not be returned until the following business day.  We are closed weekends and major holidays. You have access to a nurse at all times for urgent questions. Please call the main number to the clinic Dept: [phone] and follow the prompts.   For any non-urgent questions, you may also contact your provider using MyChart. We now offer e-Visits for anyone 72 and older to request care online for non-urgent symptoms. For details visit [email or web address].   Also download the MyChart app! Go to the app store, search "MyChart", open the app, select [place], and log in with your MyChart username and password.

## 2023-05-29 NOTE — Assessment & Plan Note (Signed)
 Last CT imaging showed excellent response to therapy with complete resolution of previously seen lymphadenopathy Overall, she tolerated treatment quite well The plan will be to continue treatment until the end of the year Plan to repeat imaging study in January

## 2023-05-29 NOTE — Assessment & Plan Note (Signed)
Her orthopedic surgeon is planning surgery this month There is no contraindication for her to proceed with surgery as immunotherapy should not affect wound healing

## 2023-05-29 NOTE — Assessment & Plan Note (Signed)
She will continue acetaminophen and ibuprofen as needed 

## 2023-05-30 LAB — T4: T4, Total: 8.1 ug/dL (ref 4.5–12.0)

## 2023-06-01 ENCOUNTER — Other Ambulatory Visit: Payer: Self-pay | Admitting: Family Medicine

## 2023-06-01 DIAGNOSIS — R600 Localized edema: Secondary | ICD-10-CM

## 2023-06-26 ENCOUNTER — Inpatient Hospital Stay: Payer: Medicare HMO | Attending: Gynecologic Oncology

## 2023-06-26 ENCOUNTER — Encounter: Payer: Self-pay | Admitting: Hematology and Oncology

## 2023-06-26 ENCOUNTER — Inpatient Hospital Stay: Payer: Medicare HMO

## 2023-06-26 ENCOUNTER — Inpatient Hospital Stay (HOSPITAL_BASED_OUTPATIENT_CLINIC_OR_DEPARTMENT_OTHER): Payer: Medicare HMO | Admitting: Hematology and Oncology

## 2023-06-26 VITALS — BP 132/70 | HR 71 | Temp 97.6°F | Resp 18 | Ht 62.0 in | Wt 176.4 lb

## 2023-06-26 DIAGNOSIS — C774 Secondary and unspecified malignant neoplasm of inguinal and lower limb lymph nodes: Secondary | ICD-10-CM | POA: Diagnosis not present

## 2023-06-26 DIAGNOSIS — D539 Nutritional anemia, unspecified: Secondary | ICD-10-CM

## 2023-06-26 DIAGNOSIS — Z79620 Long term (current) use of immunosuppressive biologic: Secondary | ICD-10-CM | POA: Diagnosis not present

## 2023-06-26 DIAGNOSIS — C519 Malignant neoplasm of vulva, unspecified: Secondary | ICD-10-CM

## 2023-06-26 DIAGNOSIS — Z5112 Encounter for antineoplastic immunotherapy: Secondary | ICD-10-CM | POA: Insufficient documentation

## 2023-06-26 DIAGNOSIS — M353 Polymyalgia rheumatica: Secondary | ICD-10-CM

## 2023-06-26 LAB — CMP (CANCER CENTER ONLY)
ALT: 11 U/L (ref 0–44)
AST: 14 U/L — ABNORMAL LOW (ref 15–41)
Albumin: 4 g/dL (ref 3.5–5.0)
Alkaline Phosphatase: 58 U/L (ref 38–126)
Anion gap: 9 (ref 5–15)
BUN: 18 mg/dL (ref 8–23)
CO2: 26 mmol/L (ref 22–32)
Calcium: 9.5 mg/dL (ref 8.9–10.3)
Chloride: 105 mmol/L (ref 98–111)
Creatinine: 0.55 mg/dL (ref 0.44–1.00)
GFR, Estimated: >60 mL/min (ref >60)
Glucose, Bld: 87 mg/dL (ref 70–99)
Potassium: 3.4 mmol/L — ABNORMAL LOW (ref 3.5–5.1)
Sodium: 140 mmol/L (ref 135–145)
Total Bilirubin: 0.7 mg/dL (ref 0.3–1.2)
Total Protein: 6.7 g/dL (ref 6.5–8.1)

## 2023-06-26 LAB — CBC WITH DIFFERENTIAL (CANCER CENTER ONLY)
Abs Immature Granulocytes: 0.01 10*3/uL (ref 0.00–0.07)
Basophils Absolute: 0 10*3/uL (ref 0.0–0.1)
Basophils Relative: 1 %
Eosinophils Absolute: 0.2 10*3/uL (ref 0.0–0.5)
Eosinophils Relative: 4 %
HCT: 34.2 % — ABNORMAL LOW (ref 36.0–46.0)
Hemoglobin: 11.3 g/dL — ABNORMAL LOW (ref 12.0–15.0)
Immature Granulocytes: 0 %
Lymphocytes Relative: 26 %
Lymphs Abs: 1.5 10*3/uL (ref 0.7–4.0)
MCH: 29.3 pg (ref 26.0–34.0)
MCHC: 33 g/dL (ref 30.0–36.0)
MCV: 88.6 fL (ref 80.0–100.0)
Monocytes Absolute: 0.4 10*3/uL (ref 0.1–1.0)
Monocytes Relative: 7 %
Neutro Abs: 3.5 10*3/uL (ref 1.7–7.7)
Neutrophils Relative %: 62 %
Platelet Count: 322 10*3/uL (ref 150–400)
RBC: 3.86 MIL/uL — ABNORMAL LOW (ref 3.87–5.11)
RDW: 13.3 % (ref 11.5–15.5)
WBC Count: 5.6 10*3/uL (ref 4.0–10.5)
nRBC: 0 % (ref 0.0–0.2)

## 2023-06-26 LAB — TSH: TSH: 2.992 u[IU]/mL (ref 0.350–4.500)

## 2023-06-26 MED ORDER — SODIUM CHLORIDE 0.9 % IV SOLN
480.0000 mg | Freq: Once | INTRAVENOUS | Status: AC
  Administered 2023-06-26: 480 mg via INTRAVENOUS
  Filled 2023-06-26: qty 48

## 2023-06-26 MED ORDER — SODIUM CHLORIDE 0.9 % IV SOLN: Freq: Once | INTRAVENOUS | Status: AC

## 2023-06-26 NOTE — Progress Notes (Signed)
[facility] OFFICE PROGRESS NOTE  Patient Care Team: [place], [name], MD as PCP - General (Family Medicine)  HISTORY OF PRESENTING ILLNESS: Discussed the use of AI scribe software for clinical note transcription with the patient, who gave verbal consent to proceed.  History of Present Illness   The patient, currently undergoing adjuvant treatment for melanoma with nivolumab, reports tolerating the treatment well. She is due for her ninth cycle of the medication, with a total of twelve cycles planned. The patient's last PET scan was in December of the previous year and a CT scan in July showed no evidence of recurrence.  The patient reports no joint pain but is taking ibuprofen periodically, however, feel less fluid in her movements compared to when on prednisone. She describe her movements as "jerky" but deny any associated pain.  The patient is also considering having another scan before the end of the year, as she has met her deductible. She is scheduled to finish her treatment by the end of the year, with her last dose falling on Christmas Eve.  The patient works in [employer] ge[employer] and is not available for treatment on Christmas Eve, so the final treatment is planned for after Christmas.  The patient expresses satisfaction with her current treatment and hopes for no recurrence of the melanoma. She compares her current treatment favorably to previous treatment with Keytruda. The patient denies needing any new prescriptions at this time.         Assessment and Plan    Melanoma No evidence of recurrence on last CT in July. Tolerating nivolumab well with minimal joint pain managed with ibuprofen. On cycle 9 of 12 of nivolumab. -Schedule PET scan for August 14, 2023. -Continue nivolumab, with next doses on end of October, November (before Thanksgiving), and December (after Christmas). -Follow-up appointment on August 21, 2023 to review scan results. -Schedule last two treatment  appointments for end of year.     Deficiency anemia This is likely anemia of chronic disease. The patient denies recent history of bleeding such as epistaxis, hematuria or hematochezia. She is asymptomatic from the anemia. We will observe for now.  She does not require transfusion now. I do not recommend any further work-up at this time.      Orders Placed This Encounter  Procedures   CT ABDOMEN PELVIS W CONTRAST    Standing Status:   Future    Standing Expiration Date:   06/25/2024    Order Specific Question:   If indicated for the ordered procedure, I authorize the administration of contrast media per Radiology protocol    Answer:   Yes    Order Specific Question:   Does the patient have a contrast media/X-ray dye allergy?    Answer:   No    Order Specific Question:   Preferred imaging location?    Answer:   [facility]    Order Specific Question:   If indicated for the ordered procedure, I authorize the administration of oral contrast media per Radiology protocol    Answer:   No    Order Specific Question:   Reason for no oral contrast:    Answer:   no need oral contrast    All questions were answered. The patient knows to call the clinic with any problems, questions or concerns. The total time spent in the appointment was 30 minutes encounter with patients including review of chart and various tests results, discussions about plan of care and coordination of care  plan   Artis Delay, MD 06/26/2023 8:33 AM  REVIEW OF SYSTEMS:   Constitutional: Denies fevers, chills or abnormal weight loss Eyes: Denies blurriness of vision Ears, nose, mouth, throat, and face: Denies mucositis or sore throat Respiratory: Denies cough, dyspnea or wheezes Cardiovascular: Denies palpitation, chest discomfort or lower extremity swelling Gastrointestinal:  Denies nausea, heartburn or change in bowel habits Skin: Denies abnormal skin rashes Lymphatics: Denies new lymphadenopathy or easy  bruising Neurological:Denies numbness, tingling or new weaknesses Behavioral/Psych: Mood is stable, no new changes  All other systems were reviewed with the patient and are negative.  I have reviewed the past medical history, past surgical history, social history and family history with the patient and they are unchanged from previous note.  ALLERGIES:  has No Known Allergies.  MEDICATIONS:  Current Outpatient Medications  Medication Sig Dispense Refill   acetaminophen (TYLENOL) 650 MG CR tablet Take 650 mg by mouth every 8 (eight) hours as needed for pain.     ammonium lactate (LAC-HYDRIN) 12 % lotion Apply 1 Application topically as needed.     Ascorbic Acid (VITAMIN C) 1000 MG tablet Take 1,000 mg by mouth daily.     Bioflavonoid Products (BIOFLEX) TABS Take 750 each by mouth daily. 2 tablets     calcium carbonate (TUMS - DOSED IN MG ELEMENTAL CALCIUM) 500 MG chewable tablet Chew 1-2 tablets by mouth daily as needed for indigestion or heartburn.     cholecalciferol (VITAMIN D3) 25 MCG (1000 UNIT) tablet Take 2,000 Units by mouth daily.     hydrochlorothiazide (HYDRODIURIL) 25 MG tablet TAKE 1 TABLET BY MOUTH EVERY DAY AS NEEDED 90 tablet 3   ibuprofen (ADVIL) 400 MG tablet Take 400 mg by mouth every 6 (six) hours as needed for moderate pain.     KLOR-CON M20 20 MEQ tablet TAKE 1 TABLET BY MOUTH EVERY DAY 90 tablet 1   Menthol-Methyl Salicylate (SALONPAS PAIN RELIEF PATCH EX) Apply 1 patch topically daily as needed (pain).     Multiple Vitamins-Minerals (MULTIVITAMIN WITH MINERALS) tablet Take 1 tablet by mouth daily.     oxyCODONE-acetaminophen (PERCOCET/ROXICET) 5-325 MG tablet Take 1 tablet by mouth every 6 (six) hours as needed for severe pain. 15 tablet 0   Pyridoxine HCl (VITAMIN B6) 100 MG TABS Take 100 mg by mouth daily.     No current facility-administered medications for this visit.    SUMMARY OF ONCOLOGIC HISTORY: Oncology History Overview Note  BRAF: undetectable  (negative)   Melanoma of vulva (HCC)  10/26/2020 Initial Diagnosis   She was noted to have bloody discharge in her underwear.  She denies pain   12/07/2020 Initial Biopsy   FINAL MICROSCOPIC DIAGNOSIS:   A.   RIGHT VULVA, BIOPSY: PRIMARY MALIGNANT MELANOMA OF VULVA, MELANOMA  TABLE BELOW  MALIGNANT MELANOMA (AJCC 8TH EDITION):  PROCEDURE: BIOPSY  SPECIMEN ANATOMIC SITE: RIGHT VULVA  BRESLOW'S DEPTH/MAXIMUM TUMOR THICKNESS: 5.05 MM  CLARK/ANATOMIC LEVEL* IV  MARGINS:  PERIPHERAL: INVOLVED  DEEP: FREE  ULCERATION: PRESENT  MITOTIC INDEX: 11/mm2  LYMPHO-VASCULAR INVASION: PRESENT*  NEUTROTROPISM: ABSENT  TUMOR-INFILTRATING LYMPHOCYTES: NON-BRISK  LYMPH NODES: N/A  PATHOLOGIC STAGE: PT4B NX MX  *VULVAR MELANOMAS MAY NOT HAVE THE SAME BEHAVIOR STAGE FOR STAGE AS SUPERFICIAL SPREADING CUTANEOUS MELANOMA, AND LANDMARKS SUCH AS BRESLOW'S DEPTH MAY NOT APPLY. NONETHELESS, THIS SHOULD BE TREATED AS A PT4B MELANOMA AND COMPLETE EXCISION, WITH POSSIBLE LYMPH NODE EVALUATION, UNDERTAKEN. SEE COMMENT FOR IHC PROFILE.    COMMENT:  Sections show confluent and nested growth  of atypical melanocytes along vulvar epithelium in an in-situ melanoma growth pattern at the periphery, with a central large ulcerated nodule of similar atypical  melanocytes, consonant with primary vulvar melanoma. Notably, IHC was performed to evaluate the lineage and is diagnostic for melanoma. Lesional melanocytes are positive with S100, MelanA, and show a high ki-67 uptake, and are negative with CDX-2, synaptophysin, CK20, TTF-1, CK7, CD56, and lymphoid markers CD5, CD3, CD20 highlight only background lymphocytes.    12/17/2020 Initial Diagnosis   Melanoma of vulva (HCC)   12/17/2020 Cancer Staging   Staging form: Melanoma of the Skin, AJCC 8th Edition - Clinical stage from 12/17/2020: Stage III (rcT4b, cN2, cM0) - Signed by Artis Delay, MD on 10/13/2022 Stage prefix: Recurrence   12/30/2020 PET scan   1. The only potential  hypermetabolic finding of note is a small flat focus of accentuated metabolic activity along the anterior vaginal vestibule/perineum, maximum SUV 6.5. Most common cause for this type of appearance would be a trace amount of incontinence of urinary FDG, but given the proximity to the reported vulvar melanoma, this small focus is considered indeterminate. 2. Other imaging findings of potential clinical significance: Aortic Atherosclerosis (ICD10-I70.0). Small type 1 hiatal hernia. Palatine tonsilloliths. Colonic diverticulosis.   01/18/2021 Surgery   Preoperative Diagnosis: Vulvar melanoma  Postoperative Diagnosis: Same  Procedures performed: Wide radical vulvectomy, R inguinofemoral sentinel LN biopsy  Attending Surgeon: Eugene Garnet, MD  Fellow: Leticia Clas  Resident: Georgana Curio, MD; Sallee Provencal, MD  Anesthesia: General endotracheal  Findings:  1. Vulva with biopsy site changes at R labia minora adjacent to the clitoris. No visible tumor. 2. Inguinofemoral nodes not clinically enlarged or abnormal.   Specimens:  1. R inguinal sentinel LN, hot and blue 2. Portion of vulva 3. Superior deep margin    01/18/2021 Pathology Results   Only melanoma in situ seen in final resection, SLN negative   02/08/2021 - 03/22/2021 Chemotherapy         03/29/2021 Genetic Testing   Negative genetic testing on the Multi-cancer +RNA panel.  The report date is March 29, 2021.  The Multi-Gene Panel offered by [facility] includes sequencing and/or deletion duplication testing of the following 84 genes: AIP, ALK, APC, ATM, AXIN2,BAP1,  BARD1, BLM, BMPR1A, BRCA1, BRCA2, BRIP1, CASR, CDC73, CDH1, CDK4, CDKN1B, CDKN1C, CDKN2A (p14ARF), CDKN2A (p16INK4a), CEBPA, CHEK2, CTNNA1, DICER1, DIS3L2, EGFR (c.2369C>T, p.Thr790Met variant only), EPCAM (Deletion/duplication testing only), FH, FLCN, GATA2, GPC3, GREM1 (Promoter region deletion/duplication testing only), HOXB13 (c.251G>A, p.Gly84Glu), HRAS,  KIT, MAX, MEN1, MET, MITF (c.952G>A, p.Glu318Lys variant only), MLH1, MSH2, MSH3, MSH6, MUTYH, NBN, NF1, NF2, NTHL1, PALB2, PDGFRA, PHOX2B, PMS2, POLD1, POLE, POT1, PRKAR1A, PTCH1, PTEN, RAD50, RAD51C, RAD51D, RB1, RECQL4, RET, RUNX1, SDHAF2, SDHA (sequence changes only), SDHB, SDHC, SDHD, SMAD4, SMARCA4, SMARCB1, SMARCE1, STK11, SUFU, TERC, TERT, TMEM127, TP53, TSC1, TSC2, VHL, WRN and WT1.     04/29/2021 PET scan   1. No vulvar mass or residual hypermetabolism. 2. No findings suspicious for metastatic disease.   11/28/2021 Pathology Results   Vulvar biopsy - fibrosis c/w scar. Small junctional nevus. No melanoma identified.   08/31/2022 PET scan   1. Enlarging RIGHT inguinal lymph node suspicious for metastatic disease. This shows only mild to moderate increased metabolic activity but displays FDG uptake greater than blood pool and shows abnormal morphology. Tissue sampling may be helpful. 2. No additional signs of disease in the neck, chest, abdomen or pelvis. 3. Signs of pelvic floor dysfunction and probable cystocele. 4. Chronic fracture of  RIGHT inferior pubic ramus signs of healing since previous imaging. 5. Aortic atherosclerosis. 6. Moderate to large hiatal hernia.   Aortic Atherosclerosis (ICD10-I70.0).   09/04/2022 Pathology Results   FINAL MICROSCOPIC DIAGNOSIS:   A. LYMPH NODE, RIGHT INGUINAL, BIOPSY:  Metastatic melanoma.  See comment.   COMMENT:  The tumor cells are loosely cohesive with foci of cytoplasmic pigment. Immunohistochemistry is positive with Melan-A, S100 and SOX10.  The morphology and immunophenotype are consistent with metastatic melanoma.      09/27/2022 Surgery   Surgery: Excision of enlarged right inguinal lymph node   Operative findings: 2 cm palpable but mobile right inguinal lymph node, hyperpigmented. No other palpable adenopathy.   11/13/2022 -  Chemotherapy   Patient is on Treatment Plan : MELANOMA Nivolumab (480) q28d     01/03/2023 Imaging   1.  Enlarged right inguinal lymph node measuring 1.1 cm in short axis, similar to prior PET-CT. 2. No other potential sites of metastatic disease noted elsewhere in the chest, abdomen or pelvis. 3. Moderate-sized hiatal hernia. 4. Colonic diverticulosis without evidence of acute diverticulitis at this time. 5. Aortic atherosclerosis. 6. Additional incidental findings, as above.     03/26/2023 Imaging   CT ABDOMEN PELVIS W CONTRAST  Result Date: 03/27/2023 CLINICAL DATA:  Melanoma, assess treatment response * Tracking Code: BO * EXAM: CT ABDOMEN AND PELVIS WITH CONTRAST TECHNIQUE: Multidetector CT imaging of the abdomen and pelvis was performed using the standard protocol following bolus administration of intravenous contrast. RADIATION DOSE REDUCTION: This exam was performed according to the departmental dose-optimization program which includes automated exposure control, adjustment of the mA and/or kV according to patient size and/or use of iterative reconstruction technique. CONTRAST:  OMNIPAQUE IOHEXOL 300 MG/ML  SOLN COMPARISON:  CT scan chest, abdomen and pelvis from 01/01/2023. FINDINGS: Lower chest: There are subpleural atelectatic changes in the visualized lung bases. No overt consolidation. No pleural effusion. The heart is normal in size. No pericardial effusion. 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 Non-cirrhotic configuration. No suspicious mass. Note is made of a lobulated 2.4 x 4.2 cm cyst in the left hepatic lobe, stable since the prior study. There is a stable subcentimeter hypoattenuating focus in the left hepatic lobe, segment 4B, which is too small to adequately characterize but appears unchanged since the prior study and favored benign as well. No intrahepatic or extrahepatic bile duct dilation. No calcified gallstones. Normal gallbladder wall thickness. No pericholecystic inflammatory changes. Pancreas:  Unremarkable. No pancreatic ductal dilatation or surrounding inflammatory changes. Spleen: Within normal limits. No focal lesion. Adrenals/Urinary Tract: Adrenal glands are unremarkable. No suspicious renal mass. Redemonstration of several sinus cysts in the left kidney. No hydronephrosis. No renal or ureteric calculi. Urinary bladder is under distended, precluding optimal assessment. However, no large mass or stones identified. No perivesical fat stranding. Stomach/Bowel: No disproportionate dilation of the small or large bowel loops. No evidence of abnormal bowel wall thickening or inflammatory changes. The appendix is unremarkable. There are multiple diverticula throughout the colon, without imaging signs of diverticulitis. There is a small-to-moderate hiatal hernia. Vascular/Lymphatic: No ascites or pneumoperitoneum. No abdominal or pelvic lymphadenopathy, by size criteria. Previously noted right inguinal lymph node (series 2, image 110 of study from 01/01/2023) adjacent to the deepest surgical staple is no longer seen on this exam. There was also another smaller lymph node (series 2, image 112 of study dated 01/01/2023), which is also not seen on today's exam. No aneurysmal dilation  of the major abdominal arteries. Reproductive: The uterus is unremarkable. No large adnexal mass. Other: There is a tiny fat containing umbilical hernia. There are metallic staples and postsurgical changes in the right inguinal region. Musculoskeletal: No suspicious osseous lesions. There are mild multilevel degenerative changes in the visualized spine. IMPRESSION: 1. No evidence of metastatic melanoma in the abdomen or pelvis. Previously noted right inguinal lymph nodes are no longer seen on today's exam. 2. Multiple other nonacute observations, as described above. Electronically Signed   By: Jules Schick M.D.   On: 03/27/2023 15:11        PHYSICAL EXAMINATION: ECOG PERFORMANCE STATUS: 1 - Symptomatic but completely  ambulatory  Vitals:   06/26/23 0815  BP: 132/70  Pulse: 71  Resp: 18  Temp: 97.6 F (36.4 C)  SpO2: 100%   Filed Weights   06/26/23 0815  Weight: 176 lb 6.4 oz (80 kg)    GENERAL:alert, no distress and comfortable   LABORATORY DATA:  I have reviewed the data as listed    Component Value Date/Time   NA 142 05/29/2023 0823   K 3.3 (L) 05/29/2023 0823   CL 105 05/29/2023 0823   CO2 27 05/29/2023 0823   GLUCOSE 105 (H) 05/29/2023 0823   BUN 19 05/29/2023 0823   CREATININE 0.54 05/29/2023 0823   CREATININE 0.82 08/23/2020 1048   CALCIUM 9.8 05/29/2023 0823   PROT 7.2 05/29/2023 0823   ALBUMIN 4.2 05/29/2023 0823   AST 12 (L) 05/29/2023 0823   ALT 11 05/29/2023 0823   ALKPHOS 65 05/29/2023 0823   BILITOT 0.7 05/29/2023 0823   GFRNONAA >60 05/29/2023 0823   GFRNONAA 77 08/23/2020 1048   GFRAA 89 08/23/2020 1048    No results found for: "SPEP", "UPEP"  Lab Results  Component Value Date   WBC 5.6 06/26/2023   NEUTROABS 3.5 06/26/2023   HGB 11.3 (L) 06/26/2023   HCT 34.2 (L) 06/26/2023   MCV 88.6 06/26/2023   PLT 322 06/26/2023      Chemistry      Component Value Date/Time   NA 142 05/29/2023 0823   K 3.3 (L) 05/29/2023 0823   CL 105 05/29/2023 0823   CO2 27 05/29/2023 0823   BUN 19 05/29/2023 0823   CREATININE 0.54 05/29/2023 0823   CREATININE 0.82 08/23/2020 1048      Component Value Date/Time   CALCIUM 9.8 05/29/2023 0823   ALKPHOS 65 05/29/2023 0823   AST 12 (L) 05/29/2023 0823   ALT 11 05/29/2023 0823   BILITOT 0.7 05/29/2023 4098

## 2023-06-26 NOTE — Patient Instructions (Signed)
[facility]  Discharge Instructions: Thank you for choosing [facility] to provide your oncology and hematology care.   If you have a lab appointment with the [facility], please go directly to the [facility] and check in at the registration area.   Wear comfortable clothing and clothing appropriate for easy access to any Portacath or PICC line.   We strive to give you quality time with your provider. You may need to reschedule your appointment if you arrive late (15 or more minutes).  Arriving late affects you and other patients whose appointments are after yours.  Also, if you miss three or more appointments without notifying the office, you may be dismissed from the clinic at the provider's discretion.      For prescription refill requests, have your pharmacy contact our office and allow 72 hours for refills to be completed.    Today you received the following chemotherapy and/or immunotherapy agents: Opdivo      To help prevent nausea and vomiting after your treatment, we encourage you to take your nausea medication as directed.  BELOW ARE SYMPTOMS THAT SHOULD BE REPORTED IMMEDIATELY: *FEVER GREATER THAN 100.4 F (38 C) OR HIGHER *CHILLS OR SWEATING *NAUSEA AND VOMITING THAT IS NOT CONTROLLED WITH YOUR NAUSEA MEDICATION *UNUSUAL SHORTNESS OF BREATH *UNUSUAL BRUISING OR BLEEDING *URINARY PROBLEMS (pain or burning when urinating, or frequent urination) *BOWEL PROBLEMS (unusual diarrhea, constipation, pain near the anus) TENDERNESS IN MOUTH AND THROAT WITH OR WITHOUT PRESENCE OF ULCERS (sore throat, sores in mouth, or a toothache) UNUSUAL RASH, SWELLING OR PAIN  UNUSUAL VAGINAL DISCHARGE OR ITCHING   Items with * indicate a potential emergency and should be followed up as soon as possible or go to the Emergency Department if any problems should occur.  Please show the CHEMOTHERAPY ALERT CARD or IMMUNOTHERAPY ALERT CARD at check-in  to the Emergency Department and triage nurse.  Should you have questions after your visit or need to cancel or reschedule your appointment, please contact [facility]  Dept: [phone]  and follow the prompts.  Office hours are 8:00 a.m. to 4:30 p.m. Monday - Friday. Please note that voicemails left after 4:00 p.m. may not be returned until the following business day.  We are closed weekends and major holidays. You have access to a nurse at all times for urgent questions. Please call the main number to the clinic Dept: [phone] and follow the prompts.   For any non-urgent questions, you may also contact your provider using MyChart. We now offer e-Visits for anyone 45 and older to request care online for non-urgent symptoms. For details visit [email or web address].   Also download the MyChart app! Go to the app store, search "MyChart", open the app, select [facility], and log in with your MyChart username and password.

## 2023-06-27 ENCOUNTER — Other Ambulatory Visit: Payer: Self-pay

## 2023-06-27 LAB — T4: T4, Total: 7.8 ug/dL (ref 4.5–12.0)

## 2023-07-24 ENCOUNTER — Inpatient Hospital Stay: Payer: Medicare HMO

## 2023-07-24 ENCOUNTER — Encounter: Payer: Self-pay | Admitting: Hematology and Oncology

## 2023-07-24 ENCOUNTER — Telehealth: Payer: Self-pay | Admitting: *Deleted

## 2023-07-24 ENCOUNTER — Inpatient Hospital Stay (HOSPITAL_BASED_OUTPATIENT_CLINIC_OR_DEPARTMENT_OTHER): Payer: Medicare HMO | Admitting: Hematology and Oncology

## 2023-07-24 DIAGNOSIS — C519 Malignant neoplasm of vulva, unspecified: Secondary | ICD-10-CM

## 2023-07-24 DIAGNOSIS — Z5112 Encounter for antineoplastic immunotherapy: Secondary | ICD-10-CM | POA: Diagnosis not present

## 2023-07-24 LAB — CBC WITH DIFFERENTIAL (CANCER CENTER ONLY)
Abs Immature Granulocytes: 0 10*3/uL (ref 0.00–0.07)
Basophils Absolute: 0 10*3/uL (ref 0.0–0.1)
Basophils Relative: 1 %
Eosinophils Absolute: 0.2 10*3/uL (ref 0.0–0.5)
Eosinophils Relative: 3 %
HCT: 36.6 % (ref 36.0–46.0)
Hemoglobin: 12 g/dL (ref 12.0–15.0)
Immature Granulocytes: 0 %
Lymphocytes Relative: 28 %
Lymphs Abs: 1.6 10*3/uL (ref 0.7–4.0)
MCH: 29.3 pg (ref 26.0–34.0)
MCHC: 32.8 g/dL (ref 30.0–36.0)
MCV: 89.5 fL (ref 80.0–100.0)
Monocytes Absolute: 0.3 10*3/uL (ref 0.1–1.0)
Monocytes Relative: 5 %
Neutro Abs: 3.5 10*3/uL (ref 1.7–7.7)
Neutrophils Relative %: 63 %
Platelet Count: 325 10*3/uL (ref 150–400)
RBC: 4.09 MIL/uL (ref 3.87–5.11)
RDW: 13.4 % (ref 11.5–15.5)
WBC Count: 5.6 10*3/uL (ref 4.0–10.5)
nRBC: 0 % (ref 0.0–0.2)

## 2023-07-24 LAB — CMP (CANCER CENTER ONLY)
ALT: 12 U/L (ref 0–44)
AST: 15 U/L (ref 15–41)
Albumin: 4.4 g/dL (ref 3.5–5.0)
Alkaline Phosphatase: 57 U/L (ref 38–126)
Anion gap: 8 (ref 5–15)
BUN: 22 mg/dL (ref 8–23)
CO2: 28 mmol/L (ref 22–32)
Calcium: 9.8 mg/dL (ref 8.9–10.3)
Chloride: 105 mmol/L (ref 98–111)
Creatinine: 0.75 mg/dL (ref 0.44–1.00)
GFR, Estimated: >60 mL/min (ref >60)
Glucose, Bld: 88 mg/dL (ref 70–99)
Potassium: 3.8 mmol/L (ref 3.5–5.1)
Sodium: 141 mmol/L (ref 135–145)
Total Bilirubin: 0.6 mg/dL (ref 0.3–1.2)
Total Protein: 7.3 g/dL (ref 6.5–8.1)

## 2023-07-24 LAB — TSH: TSH: 3.762 u[IU]/mL (ref 0.350–4.500)

## 2023-07-24 MED ORDER — SODIUM CHLORIDE 0.9% FLUSH: 10.0000 mL | INTRAVENOUS | Status: DC | PRN

## 2023-07-24 MED ORDER — HEPARIN SOD (PORK) LOCK FLUSH 100 UNIT/ML IV SOLN
500.0000 [IU] | Freq: Once | INTRAVENOUS | Status: DC | PRN
Start: 2023-07-24 — End: 2023-07-24

## 2023-07-24 MED ORDER — SODIUM CHLORIDE 0.9 % IV SOLN: Freq: Once | INTRAVENOUS | Status: AC

## 2023-07-24 MED ORDER — SODIUM CHLORIDE 0.9 % IV SOLN
480.0000 mg | Freq: Once | INTRAVENOUS | Status: AC
  Administered 2023-07-24: 480 mg via INTRAVENOUS
  Filled 2023-07-24: qty 48

## 2023-07-24 NOTE — Progress Notes (Signed)
[facility] OFFICE PROGRESS NOTE  Patient Care Team: [place], [name], MD as PCP - General (Family Medicine)  HISTORY OF PRESENTING ILLNESS: Discussed the use of AI scribe software for clinical note transcription with the patient, who gave verbal consent to proceed.  History of Present Illness   The patient, with a history of melanoma of vulva, anemia and low potassium levels possibly related to HCTZ use, has been compliant with scheduled appointments and procedures, including a recent flu shot and scheduling an upcoming CT scan. The patient has not reported any new symptoms or side effects, and recent blood work has shown improvement with normal results, indicating no current anemia or low potassium levels.         Assessment and Plan    History of melanoma No new lesions reported. CT scan scheduled for further evaluation. Discussed the benefit of having the CT scan prior to the appointment with Dr. Pricilla Holm for a more focused evaluation. -Continue with scheduled treatment -Attempt to reschedule appointment with Dr. Pricilla Holm to occur after the CT scan.  Anemia, resolved Previously noted, but today's blood work shows normal hemoglobin and hematocrit. -Continue current management.  Hypokalemia Borderline low potassium likely secondary to HCTZ use. No intervention needed at this time. -Continue current management and monitor potassium levels.  General Health Maintenance -Confirmed receipt of annual influenza vaccine.          No orders of the defined types were placed in this encounter.   All questions were answered. The patient knows to call the clinic with any problems, questions or concerns. The total time spent in the appointment was 20 minutes encounter with patients including review of chart and various tests results, discussions about plan of care and coordination of care plan   Artis Delay, MD 07/24/2023 10:34 AM  REVIEW OF SYSTEMS:  All other systems were  reviewed with the patient and are negative.  I have reviewed the past medical history, past surgical history, social history and family history with the patient and they are unchanged from previous note.  ALLERGIES:  has No Known Allergies.  MEDICATIONS:  Current Outpatient Medications  Medication Sig Dispense Refill   acetaminophen (TYLENOL) 650 MG CR tablet Take 650 mg by mouth every 8 (eight) hours as needed for pain.     ammonium lactate (LAC-HYDRIN) 12 % lotion Apply 1 Application topically as needed.     Ascorbic Acid (VITAMIN C) 1000 MG tablet Take 1,000 mg by mouth daily.     Bioflavonoid Products (BIOFLEX) TABS Take 750 each by mouth daily. 2 tablets     calcium carbonate (TUMS - DOSED IN MG ELEMENTAL CALCIUM) 500 MG chewable tablet Chew 1-2 tablets by mouth daily as needed for indigestion or heartburn.     cholecalciferol (VITAMIN D3) 25 MCG (1000 UNIT) tablet Take 2,000 Units by mouth daily.     hydrochlorothiazide (HYDRODIURIL) 25 MG tablet TAKE 1 TABLET BY MOUTH EVERY DAY AS NEEDED 90 tablet 3   ibuprofen (ADVIL) 400 MG tablet Take 400 mg by mouth every 6 (six) hours as needed for moderate pain.     KLOR-CON M20 20 MEQ tablet TAKE 1 TABLET BY MOUTH EVERY DAY 90 tablet 1   Menthol-Methyl Salicylate (SALONPAS PAIN RELIEF PATCH EX) Apply 1 patch topically daily as needed (pain).     Multiple Vitamins-Minerals (MULTIVITAMIN WITH MINERALS) tablet Take 1 tablet by mouth daily.     Pyridoxine HCl (VITAMIN B6) 100 MG TABS Take 100 mg by mouth  daily.     No current facility-administered medications for this visit.   Facility-Administered Medications Ordered in Other Visits  Medication Dose Route Frequency Provider Last Rate Last Admin   heparin lock flush 100 unit/mL  500 Units Intracatheter Once PRN Bertis Ruddy, Destynie Toomey, MD       nivolumab (OPDIVO) 480 mg in sodium chloride 0.9 % 100 mL chemo infusion  480 mg Intravenous Once Bertis Ruddy, Juda Lajeunesse, MD 296 mL/hr at 07/24/23 1014 480 mg at 07/24/23 1014    sodium chloride flush (NS) 0.9 % injection 10 mL  10 mL Intracatheter PRN Artis Delay, MD        SUMMARY OF ONCOLOGIC HISTORY: Oncology History Overview Note  BRAF: undetectable (negative)   Melanoma of vulva (HCC)  10/26/2020 Initial Diagnosis   She was noted to have bloody discharge in her underwear.  She denies pain   12/07/2020 Initial Biopsy   FINAL MICROSCOPIC DIAGNOSIS:   A.   RIGHT VULVA, BIOPSY: PRIMARY MALIGNANT MELANOMA OF VULVA, MELANOMA  TABLE BELOW  MALIGNANT MELANOMA (AJCC 8TH EDITION):  PROCEDURE: BIOPSY  SPECIMEN ANATOMIC SITE: RIGHT VULVA  BRESLOW'S DEPTH/MAXIMUM TUMOR THICKNESS: 5.05 MM  CLARK/ANATOMIC LEVEL* IV  MARGINS:  PERIPHERAL: INVOLVED  DEEP: FREE  ULCERATION: PRESENT  MITOTIC INDEX: 11/mm2  LYMPHO-VASCULAR INVASION: PRESENT*  NEUTROTROPISM: ABSENT  TUMOR-INFILTRATING LYMPHOCYTES: NON-BRISK  LYMPH NODES: N/A  PATHOLOGIC STAGE: PT4B NX MX  *VULVAR MELANOMAS MAY NOT HAVE THE SAME BEHAVIOR STAGE FOR STAGE AS SUPERFICIAL SPREADING CUTANEOUS MELANOMA, AND LANDMARKS SUCH AS BRESLOW'S DEPTH MAY NOT APPLY. NONETHELESS, THIS SHOULD BE TREATED AS A PT4B MELANOMA AND COMPLETE EXCISION, WITH POSSIBLE LYMPH NODE EVALUATION, UNDERTAKEN. SEE COMMENT FOR IHC PROFILE.    COMMENT:  Sections show confluent and nested growth of atypical melanocytes along vulvar epithelium in an in-situ melanoma growth pattern at the periphery, with a central large ulcerated nodule of similar atypical  melanocytes, consonant with primary vulvar melanoma. Notably, IHC was performed to evaluate the lineage and is diagnostic for melanoma. Lesional melanocytes are positive with S100, MelanA, and show a high ki-67 uptake, and are negative with CDX-2, synaptophysin, CK20, TTF-1, CK7, CD56, and lymphoid markers CD5, CD3, CD20 highlight only background lymphocytes.    12/17/2020 Initial Diagnosis   Melanoma of vulva (HCC)   12/17/2020 Cancer Staging   Staging form: Melanoma of the Skin, AJCC 8th  Edition - Clinical stage from 12/17/2020: Stage III (rcT4b, cN2, cM0) - Signed by Artis Delay, MD on 10/13/2022 Stage prefix: Recurrence   12/30/2020 PET scan   1. The only potential hypermetabolic finding of note is a small flat focus of accentuated metabolic activity along the anterior vaginal vestibule/perineum, maximum SUV 6.5. Most common cause for this type of appearance would be a trace amount of incontinence of urinary FDG, but given the proximity to the reported vulvar melanoma, this small focus is considered indeterminate. 2. Other imaging findings of potential clinical significance: Aortic Atherosclerosis (ICD10-I70.0). Small type 1 hiatal hernia. Palatine tonsilloliths. Colonic diverticulosis.   01/18/2021 Surgery   Preoperative Diagnosis: Vulvar melanoma  Postoperative Diagnosis: Same  Procedures performed: Wide radical vulvectomy, R inguinofemoral sentinel LN biopsy  Attending Surgeon: Eugene Garnet, MD  Fellow: Leticia Clas  Resident: Georgana Curio, MD; Sallee Provencal, MD  Anesthesia: General endotracheal  Findings:  1. Vulva with biopsy site changes at R labia minora adjacent to the clitoris. No visible tumor. 2. Inguinofemoral nodes not clinically enlarged or abnormal.   Specimens:  1. R inguinal sentinel LN, hot and blue 2. Portion of  vulva 3. Superior deep margin    01/18/2021 Pathology Results   Only melanoma in situ seen in final resection, SLN negative   02/08/2021 - 03/22/2021 Chemotherapy         03/29/2021 Genetic Testing   Negative genetic testing on the Multi-cancer +RNA panel.  The report date is March 29, 2021.  The Multi-Gene Panel offered by [facility] includes sequencing and/or deletion duplication testing of the following 84 genes: AIP, ALK, APC, ATM, AXIN2,BAP1,  BARD1, BLM, BMPR1A, BRCA1, BRCA2, BRIP1, CASR, CDC73, CDH1, CDK4, CDKN1B, CDKN1C, CDKN2A (p14ARF), CDKN2A (p16INK4a), CEBPA, CHEK2, CTNNA1, DICER1, DIS3L2, EGFR (c.2369C>T,  p.Thr790Met variant only), EPCAM (Deletion/duplication testing only), FH, FLCN, GATA2, GPC3, GREM1 (Promoter region deletion/duplication testing only), HOXB13 (c.251G>A, p.Gly84Glu), HRAS, KIT, MAX, MEN1, MET, MITF (c.952G>A, p.Glu318Lys variant only), MLH1, MSH2, MSH3, MSH6, MUTYH, NBN, NF1, NF2, NTHL1, PALB2, PDGFRA, PHOX2B, PMS2, POLD1, POLE, POT1, PRKAR1A, PTCH1, PTEN, RAD50, RAD51C, RAD51D, RB1, RECQL4, RET, RUNX1, SDHAF2, SDHA (sequence changes only), SDHB, SDHC, SDHD, SMAD4, SMARCA4, SMARCB1, SMARCE1, STK11, SUFU, TERC, TERT, TMEM127, TP53, TSC1, TSC2, VHL, WRN and WT1.     04/29/2021 PET scan   1. No vulvar mass or residual hypermetabolism. 2. No findings suspicious for metastatic disease.   11/28/2021 Pathology Results   Vulvar biopsy - fibrosis c/w scar. Small junctional nevus. No melanoma identified.   08/31/2022 PET scan   1. Enlarging RIGHT inguinal lymph node suspicious for metastatic disease. This shows only mild to moderate increased metabolic activity but displays FDG uptake greater than blood pool and shows abnormal morphology. Tissue sampling may be helpful. 2. No additional signs of disease in the neck, chest, abdomen or pelvis. 3. Signs of pelvic floor dysfunction and probable cystocele. 4. Chronic fracture of RIGHT inferior pubic ramus signs of healing since previous imaging. 5. Aortic atherosclerosis. 6. Moderate to large hiatal hernia.   Aortic Atherosclerosis (ICD10-I70.0).   09/04/2022 Pathology Results   FINAL MICROSCOPIC DIAGNOSIS:   A. LYMPH NODE, RIGHT INGUINAL, BIOPSY:  Metastatic melanoma.  See comment.   COMMENT:  The tumor cells are loosely cohesive with foci of cytoplasmic pigment. Immunohistochemistry is positive with Melan-A, S100 and SOX10.  The morphology and immunophenotype are consistent with metastatic melanoma.      09/27/2022 Surgery   Surgery: Excision of enlarged right inguinal lymph node   Operative findings: 2 cm palpable but mobile right  inguinal lymph node, hyperpigmented. No other palpable adenopathy.   11/13/2022 -  Chemotherapy   Patient is on Treatment Plan : MELANOMA Nivolumab (480) q28d     01/03/2023 Imaging   1. Enlarged right inguinal lymph node measuring 1.1 cm in short axis, similar to prior PET-CT. 2. No other potential sites of metastatic disease noted elsewhere in the chest, abdomen or pelvis. 3. Moderate-sized hiatal hernia. 4. Colonic diverticulosis without evidence of acute diverticulitis at this time. 5. Aortic atherosclerosis. 6. Additional incidental findings, as above.     03/26/2023 Imaging   CT ABDOMEN PELVIS W CONTRAST  Result Date: 03/27/2023 CLINICAL DATA:  Melanoma, assess treatment response * Tracking Code: BO * EXAM: CT ABDOMEN AND PELVIS WITH CONTRAST TECHNIQUE: Multidetector CT imaging of the abdomen and pelvis was performed using the standard protocol following bolus administration of intravenous contrast. RADIATION DOSE REDUCTION: This exam was performed according to the departmental dose-optimization program which includes automated exposure control, adjustment of the mA and/or kV according to patient size and/or use of iterative reconstruction technique. CONTRAST:  OMNIPAQUE IOHEXOL 300 MG/ML  SOLN COMPARISON:  CT scan  chest, abdomen and pelvis from 01/01/2023. FINDINGS: Lower chest: There are subpleural atelectatic changes in the visualized lung bases. No overt consolidation. No pleural effusion. The heart is normal in size. No pericardial effusion. 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 Non-cirrhotic configuration. No suspicious mass. Note is made of a lobulated 2.4 x 4.2 cm cyst in the left hepatic lobe, stable since the prior study. There is a stable subcentimeter hypoattenuating focus in the left hepatic lobe, segment 4B, which is too small to adequately characterize but appears unchanged since the prior study and favored  benign as well. No intrahepatic or extrahepatic bile duct dilation. No calcified gallstones. Normal gallbladder wall thickness. No pericholecystic inflammatory changes. Pancreas: Unremarkable. No pancreatic ductal dilatation or surrounding inflammatory changes. Spleen: Within normal limits. No focal lesion. Adrenals/Urinary Tract: Adrenal glands are unremarkable. No suspicious renal mass. Redemonstration of several sinus cysts in the left kidney. No hydronephrosis. No renal or ureteric calculi. Urinary bladder is under distended, precluding optimal assessment. However, no large mass or stones identified. No perivesical fat stranding. Stomach/Bowel: No disproportionate dilation of the small or large bowel loops. No evidence of abnormal bowel wall thickening or inflammatory changes. The appendix is unremarkable. There are multiple diverticula throughout the colon, without imaging signs of diverticulitis. There is a small-to-moderate hiatal hernia. Vascular/Lymphatic: No ascites or pneumoperitoneum. No abdominal or pelvic lymphadenopathy, by size criteria. Previously noted right inguinal lymph node (series 2, image 110 of study from 01/01/2023) adjacent to the deepest surgical staple is no longer seen on this exam. There was also another smaller lymph node (series 2, image 112 of study dated 01/01/2023), which is also not seen on today's exam. No aneurysmal dilation of the major abdominal arteries. Reproductive: The uterus is unremarkable. No large adnexal mass. Other: There is a tiny fat containing umbilical hernia. There are metallic staples and postsurgical changes in the right inguinal region. Musculoskeletal: No suspicious osseous lesions. There are mild multilevel degenerative changes in the visualized spine. IMPRESSION: 1. No evidence of metastatic melanoma in the abdomen or pelvis. Previously noted right inguinal lymph nodes are no longer seen on today's exam. 2. Multiple other nonacute observations, as  described above. Electronically Signed   By: Jules Schick M.D.   On: 03/27/2023 15:11        PHYSICAL EXAMINATION: ECOG PERFORMANCE STATUS: 0 - Asymptomatic  Vitals:   07/24/23 0828  BP: 121/76  Pulse: 66  Resp: 18  Temp: 97.8 F (36.6 C)  SpO2: 95%   Filed Weights   07/24/23 0828  Weight: 177 lb (80.3 kg)    GENERAL:alert, no distress and comfortable  LABORATORY DATA:  I have reviewed the data as listed    Component Value Date/Time   NA 141 07/24/2023 0804   K 3.8 07/24/2023 0804   CL 105 07/24/2023 0804   CO2 28 07/24/2023 0804   GLUCOSE 88 07/24/2023 0804   BUN 22 07/24/2023 0804   CREATININE 0.75 07/24/2023 0804   CREATININE 0.82 08/23/2020 1048   CALCIUM 9.8 07/24/2023 0804   PROT 7.3 07/24/2023 0804   ALBUMIN 4.4 07/24/2023 0804   AST 15 07/24/2023 0804   ALT 12 07/24/2023 0804   ALKPHOS 57 07/24/2023 0804   BILITOT 0.6 07/24/2023 0804   GFRNONAA >60 07/24/2023 0804   GFRNONAA 77 08/23/2020 1048   GFRAA 89 08/23/2020 1048    No results found for: "SPEP", "UPEP"  Lab Results  Component Value Date   WBC  5.6 07/24/2023   NEUTROABS 3.5 07/24/2023   HGB 12.0 07/24/2023   HCT 36.6 07/24/2023   MCV 89.5 07/24/2023   PLT 325 07/24/2023      Chemistry      Component Value Date/Time   NA 141 07/24/2023 0804   K 3.8 07/24/2023 0804   CL 105 07/24/2023 0804   CO2 28 07/24/2023 0804   BUN 22 07/24/2023 0804   CREATININE 0.75 07/24/2023 0804   CREATININE 0.82 08/23/2020 1048      Component Value Date/Time   CALCIUM 9.8 07/24/2023 0804   ALKPHOS 57 07/24/2023 0804   AST 15 07/24/2023 0804   ALT 12 07/24/2023 0804   BILITOT 0.6 07/24/2023 0804

## 2023-07-24 NOTE — Telephone Encounter (Signed)
Per provider moved appt from 11/14 to 11/22

## 2023-07-25 LAB — T4: T4, Total: 8.6 ug/dL (ref 4.5–12.0)

## 2023-08-02 ENCOUNTER — Other Ambulatory Visit: Payer: Self-pay

## 2023-08-09 ENCOUNTER — Ambulatory Visit: Payer: Medicare HMO | Admitting: Gynecologic Oncology

## 2023-08-14 ENCOUNTER — Ambulatory Visit (HOSPITAL_COMMUNITY)
Admission: RE | Admit: 2023-08-14 | Discharge: 2023-08-14 | Disposition: A | Discharge status: Home / Self Care | Payer: Medicare HMO | Source: Ambulatory Visit | Attending: Hematology and Oncology | Admitting: Hematology and Oncology

## 2023-08-14 ENCOUNTER — Encounter: Payer: Self-pay | Admitting: Gynecologic Oncology

## 2023-08-14 DIAGNOSIS — C519 Malignant neoplasm of vulva, unspecified: Secondary | ICD-10-CM | POA: Diagnosis present

## 2023-08-14 MED ORDER — IOHEXOL 300 MG/ML  SOLN
100.0000 mL | Freq: Once | INTRAMUSCULAR | Status: AC | PRN
  Administered 2023-08-14: 100 mL via INTRAVENOUS

## 2023-08-17 ENCOUNTER — Inpatient Hospital Stay: Payer: Medicare HMO | Attending: Gynecologic Oncology | Admitting: Gynecologic Oncology

## 2023-08-17 ENCOUNTER — Encounter: Payer: Self-pay | Admitting: Gynecologic Oncology

## 2023-08-17 VITALS — BP 144/77 | HR 69 | Temp 97.7°F | Resp 20 | Wt 174.2 lb

## 2023-08-17 DIAGNOSIS — Z9221 Personal history of antineoplastic chemotherapy: Secondary | ICD-10-CM | POA: Insufficient documentation

## 2023-08-17 DIAGNOSIS — C519 Malignant neoplasm of vulva, unspecified: Secondary | ICD-10-CM | POA: Insufficient documentation

## 2023-08-17 DIAGNOSIS — Z5112 Encounter for antineoplastic immunotherapy: Secondary | ICD-10-CM | POA: Diagnosis present

## 2023-08-17 DIAGNOSIS — Z79620 Long term (current) use of immunosuppressive biologic: Secondary | ICD-10-CM | POA: Diagnosis not present

## 2023-08-17 DIAGNOSIS — Z8541 Personal history of malignant neoplasm of cervix uteri: Secondary | ICD-10-CM

## 2023-08-17 DIAGNOSIS — Z9079 Acquired absence of other genital organ(s): Secondary | ICD-10-CM | POA: Insufficient documentation

## 2023-08-17 DIAGNOSIS — M353 Polymyalgia rheumatica: Secondary | ICD-10-CM | POA: Insufficient documentation

## 2023-08-17 DIAGNOSIS — Z8544 Personal history of malignant neoplasm of other female genital organs: Secondary | ICD-10-CM | POA: Insufficient documentation

## 2023-08-17 NOTE — Patient Instructions (Signed)
It was good to see you today.  I do not see or feel any evidence of cancer recurrence on your exam.  I will see you for follow-up in 3 months.  As always, if you develop any new and concerning symptoms before your next visit, please call to see me sooner.  

## 2023-08-17 NOTE — Progress Notes (Signed)
Gynecologic Oncology Return Clinic Visit  08/17/23  Reason for Visit: surveillance  Treatment History: Oncology History Overview Note  BRAF: undetectable (negative)   Melanoma of vulva (HCC)  10/26/2020 Initial Diagnosis   She was noted to have bloody discharge in her underwear.  She denies pain   12/07/2020 Initial Biopsy   FINAL MICROSCOPIC DIAGNOSIS:   A.   RIGHT VULVA, BIOPSY: PRIMARY MALIGNANT MELANOMA OF VULVA, MELANOMA  TABLE BELOW  MALIGNANT MELANOMA (AJCC 8TH EDITION):  PROCEDURE: BIOPSY  SPECIMEN ANATOMIC SITE: RIGHT VULVA  BRESLOW'S DEPTH/MAXIMUM TUMOR THICKNESS: 5.05 MM  CLARK/ANATOMIC LEVEL* IV  MARGINS:  PERIPHERAL: INVOLVED  DEEP: FREE  ULCERATION: PRESENT  MITOTIC INDEX: 11/mm2  LYMPHO-VASCULAR INVASION: PRESENT*  NEUTROTROPISM: ABSENT  TUMOR-INFILTRATING LYMPHOCYTES: NON-BRISK  LYMPH NODES: N/A  PATHOLOGIC STAGE: PT4B NX MX  *VULVAR MELANOMAS MAY NOT HAVE THE SAME BEHAVIOR STAGE FOR STAGE AS SUPERFICIAL SPREADING CUTANEOUS MELANOMA, AND LANDMARKS SUCH AS BRESLOW'S DEPTH MAY NOT APPLY. NONETHELESS, THIS SHOULD BE TREATED AS A PT4B MELANOMA AND COMPLETE EXCISION, WITH POSSIBLE LYMPH NODE EVALUATION, UNDERTAKEN. SEE COMMENT FOR IHC PROFILE.    COMMENT:  Sections show confluent and nested growth of atypical melanocytes along vulvar epithelium in an in-situ melanoma growth pattern at the periphery, with a central large ulcerated nodule of similar atypical  melanocytes, consonant with primary vulvar melanoma. Notably, IHC was performed to evaluate the lineage and is diagnostic for melanoma. Lesional melanocytes are positive with S100, MelanA, and show a high ki-67 uptake, and are negative with CDX-2, synaptophysin, CK20, TTF-1, CK7, CD56, and lymphoid markers CD5, CD3, CD20 highlight only background lymphocytes.    12/17/2020 Initial Diagnosis   Melanoma of vulva (HCC)   12/17/2020 Cancer Staging   Staging form: Melanoma of the Skin, AJCC 8th Edition - Clinical  stage from 12/17/2020: Stage III (rcT4b, cN2, cM0) - Signed by Artis Delay, MD on 10/13/2022 Stage prefix: Recurrence   12/30/2020 PET scan   1. The only potential hypermetabolic finding of note is a small flat focus of accentuated metabolic activity along the anterior vaginal vestibule/perineum, maximum SUV 6.5. Most common cause for this type of appearance would be a trace amount of incontinence of urinary FDG, but given the proximity to the reported vulvar melanoma, this small focus is considered indeterminate. 2. Other imaging findings of potential clinical significance: Aortic Atherosclerosis (ICD10-I70.0). Small type 1 hiatal hernia. Palatine tonsilloliths. Colonic diverticulosis.   01/18/2021 Surgery   Preoperative Diagnosis: Vulvar melanoma  Postoperative Diagnosis: Same  Procedures performed: Wide radical vulvectomy, R inguinofemoral sentinel LN biopsy  Attending Surgeon: Eugene Garnet, MD  Fellow: Leticia Clas  Resident: Georgana Curio, MD; Sallee Provencal, MD  Anesthesia: General endotracheal  Findings:  1. Vulva with biopsy site changes at R labia minora adjacent to the clitoris. No visible tumor. 2. Inguinofemoral nodes not clinically enlarged or abnormal.   Specimens:  1. R inguinal sentinel LN, hot and blue 2. Portion of vulva 3. Superior deep margin    01/18/2021 Pathology Results   Only melanoma in situ seen in final resection, SLN negative   02/08/2021 - 03/22/2021 Chemotherapy         03/29/2021 Genetic Testing   Negative genetic testing on the Multi-cancer +RNA panel.  The report date is March 29, 2021.  The Multi-Gene Panel offered by [facility] includes sequencing and/or deletion duplication testing of the following 84 genes: AIP, ALK, APC, ATM, AXIN2,BAP1,  BARD1, BLM, BMPR1A, BRCA1, BRCA2, BRIP1, CASR, CDC73, CDH1, CDK4, CDKN1B, CDKN1C, CDKN2A (p14ARF), CDKN2A (p16INK4a), CEBPA,  CHEK2, CTNNA1, DICER1, DIS3L2, EGFR (c.2369C>T, p.Thr790Met variant only),  EPCAM (Deletion/duplication testing only), FH, FLCN, GATA2, GPC3, GREM1 (Promoter region deletion/duplication testing only), HOXB13 (c.251G>A, p.Gly84Glu), HRAS, KIT, MAX, MEN1, MET, MITF (c.952G>A, p.Glu318Lys variant only), MLH1, MSH2, MSH3, MSH6, MUTYH, NBN, NF1, NF2, NTHL1, PALB2, PDGFRA, PHOX2B, PMS2, POLD1, POLE, POT1, PRKAR1A, PTCH1, PTEN, RAD50, RAD51C, RAD51D, RB1, RECQL4, RET, RUNX1, SDHAF2, SDHA (sequence changes only), SDHB, SDHC, SDHD, SMAD4, SMARCA4, SMARCB1, SMARCE1, STK11, SUFU, TERC, TERT, TMEM127, TP53, TSC1, TSC2, VHL, WRN and WT1.     04/29/2021 PET scan   1. No vulvar mass or residual hypermetabolism. 2. No findings suspicious for metastatic disease.   11/28/2021 Pathology Results   Vulvar biopsy - fibrosis c/w scar. Small junctional nevus. No melanoma identified.   08/31/2022 PET scan   1. Enlarging RIGHT inguinal lymph node suspicious for metastatic disease. This shows only mild to moderate increased metabolic activity but displays FDG uptake greater than blood pool and shows abnormal morphology. Tissue sampling may be helpful. 2. No additional signs of disease in the neck, chest, abdomen or pelvis. 3. Signs of pelvic floor dysfunction and probable cystocele. 4. Chronic fracture of RIGHT inferior pubic ramus signs of healing since previous imaging. 5. Aortic atherosclerosis. 6. Moderate to large hiatal hernia.   Aortic Atherosclerosis (ICD10-I70.0).   09/04/2022 Pathology Results   FINAL MICROSCOPIC DIAGNOSIS:   A. LYMPH NODE, RIGHT INGUINAL, BIOPSY:  Metastatic melanoma.  See comment.   COMMENT:  The tumor cells are loosely cohesive with foci of cytoplasmic pigment. Immunohistochemistry is positive with Melan-A, S100 and SOX10.  The morphology and immunophenotype are consistent with metastatic melanoma.      09/27/2022 Surgery   Surgery: Excision of enlarged right inguinal lymph node   Operative findings: 2 cm palpable but mobile right inguinal lymph node,  hyperpigmented. No other palpable adenopathy.   11/13/2022 -  Chemotherapy   Patient is on Treatment Plan : MELANOMA Nivolumab (480) q28d     01/03/2023 Imaging   1. Enlarged right inguinal lymph node measuring 1.1 cm in short axis, similar to prior PET-CT. 2. No other potential sites of metastatic disease noted elsewhere in the chest, abdomen or pelvis. 3. Moderate-sized hiatal hernia. 4. Colonic diverticulosis without evidence of acute diverticulitis at this time. 5. Aortic atherosclerosis. 6. Additional incidental findings, as above.     03/26/2023 Imaging   CT ABDOMEN PELVIS W CONTRAST  Result Date: 03/27/2023 CLINICAL DATA:  Melanoma, assess treatment response * Tracking Code: BO * EXAM: CT ABDOMEN AND PELVIS WITH CONTRAST TECHNIQUE: Multidetector CT imaging of the abdomen and pelvis was performed using the standard protocol following bolus administration of intravenous contrast. RADIATION DOSE REDUCTION: This exam was performed according to the departmental dose-optimization program which includes automated exposure control, adjustment of the mA and/or kV according to patient size and/or use of iterative reconstruction technique. CONTRAST:  OMNIPAQUE IOHEXOL 300 MG/ML  SOLN COMPARISON:  CT scan chest, abdomen and pelvis from 01/01/2023. FINDINGS: Lower chest: There are subpleural atelectatic changes in the visualized lung bases. No overt consolidation. No pleural effusion. The heart is normal in size. No pericardial effusion. 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 Non-cirrhotic configuration. No suspicious mass. Note is made of a lobulated 2.4 x 4.2 cm cyst in the left hepatic lobe, stable since the prior study. There is a stable subcentimeter hypoattenuating focus in the left hepatic lobe, segment 4B, which is too small to adequately characterize but appears unchanged since  the prior study and favored benign as well. No  intrahepatic or extrahepatic bile duct dilation. No calcified gallstones. Normal gallbladder wall thickness. No pericholecystic inflammatory changes. Pancreas: Unremarkable. No pancreatic ductal dilatation or surrounding inflammatory changes. Spleen: Within normal limits. No focal lesion. Adrenals/Urinary Tract: Adrenal glands are unremarkable. No suspicious renal mass. Redemonstration of several sinus cysts in the left kidney. No hydronephrosis. No renal or ureteric calculi. Urinary bladder is under distended, precluding optimal assessment. However, no large mass or stones identified. No perivesical fat stranding. Stomach/Bowel: No disproportionate dilation of the small or large bowel loops. No evidence of abnormal bowel wall thickening or inflammatory changes. The appendix is unremarkable. There are multiple diverticula throughout the colon, without imaging signs of diverticulitis. There is a small-to-moderate hiatal hernia. Vascular/Lymphatic: No ascites or pneumoperitoneum. No abdominal or pelvic lymphadenopathy, by size criteria. Previously noted right inguinal lymph node (series 2, image 110 of study from 01/01/2023) adjacent to the deepest surgical staple is no longer seen on this exam. There was also another smaller lymph node (series 2, image 112 of study dated 01/01/2023), which is also not seen on today's exam. No aneurysmal dilation of the major abdominal arteries. Reproductive: The uterus is unremarkable. No large adnexal mass. Other: There is a tiny fat containing umbilical hernia. There are metallic staples and postsurgical changes in the right inguinal region. Musculoskeletal: No suspicious osseous lesions. There are mild multilevel degenerative changes in the visualized spine. IMPRESSION: 1. No evidence of metastatic melanoma in the abdomen or pelvis. Previously noted right inguinal lymph nodes are no longer seen on today's exam. 2. Multiple other nonacute observations, as described above.  Electronically Signed   By: Jules Schick M.D.   On: 03/27/2023 15:11        Interval History: Doing well.  Joint pain is controlled with ibuprofen.  Reports baseline bowel and bladder function.  Denies any vulvar symptoms including pruritus, pain, bleeding, or discharge.  Past Medical/Surgical History: 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   melanoma   Complication of anesthesia    hard to wake up after anesthesia   Family history of melanoma    History of kidney stones    Melanotic macule of vulvar labia    Migraine    Polymyalgia rheumatica (HCC)    Venous insufficiency     Past Surgical History:  Procedure Laterality Date   Cataract Right 05/22/2022   CATARACT EXTRACTION Left 06/05/2022   COLONOSCOPY     INGUINAL LYMPHADENECTOMY Right 09/27/2022   Procedure: RIGHT INGUINAL LYMPH NODE DEBULKING;  Surgeon: Carver Fila, MD;  Location: [facility];  Service: Gynecology;  Laterality: Right;   VULVECTOMY  01/19/2021   done at [facility], had sentinel node biopsy also   WRIST SURGERY  05/15/2023   Left    Family History  Problem Relation Age of Onset   Diabetes Mother    AVM Father    Melanoma Father        multiple melanomas dx between 58-90   Cancer Father 67       tumor on kidney   Cancer Paternal Grandfather        laryngeal cancer   Cancer Daughter 81       appendiceal cancer   Breast cancer Neg Hx    Ovarian cancer Neg Hx    Colon cancer Neg Hx  Uterine cancer Neg Hx    Colon polyps Neg Hx    Esophageal cancer Neg Hx    Stomach cancer Neg Hx    Rectal cancer Neg Hx     Social History   Socioeconomic History   Marital status: Married    Spouse name: Not on file   Number of children: 2   Years of education: Not on file   Highest education level: 12th grade  Occupational History   Occupation: Estate manager/land agent   Occupation: stocking   Tobacco Use   Smoking status: Never   Smokeless tobacco: Never  Vaping Use   Vaping status: Never Used  Substance and Sexual Activity   Alcohol use: Yes    Comment: occasional   Drug use: No   Sexual activity: Not Currently  Other Topics Concern   Not on file  Social History Narrative   Not on file   Social Determinants of Health   Financial Resource Strain: Not on file  Food Insecurity: No Food Insecurity (10/03/2022)   Hunger Vital Sign    Worried About Running Out of Food in the Last Year: Never true    Ran Out of Food in the Last Year: Never true  Transportation Needs: No Transportation Needs (10/03/2022)   PRAPARE - Administrator, Civil Service (Medical): No    Lack of Transportation (Non-Medical): No  Physical Activity: Insufficiently Active (10/03/2022)   Exercise Vital Sign    Days of Exercise per Week: 7 days    Minutes of Exercise per Session: 20 min  Stress: No Stress Concern Present (10/03/2022)   [employer] of Occupational Health - Occupational Stress Questionnaire    Feeling of Stress : Not at all  Social Connections: Socially Isolated (10/03/2022)   Social Connection and Isolation Panel [NHANES]    Frequency of Communication with Friends and Family: Never    Frequency of Social Gatherings with Friends and Family: Once a week    Attends Religious Services: Never    Database administrator or Organizations: No    Attends Engineer, structural: Not on file    Marital Status: Married    Current Medications:  Current Outpatient Medications:    acetaminophen (TYLENOL) 650 MG CR tablet, Take 650 mg by mouth every 8 (eight) hours as needed for pain., Disp: , Rfl:    ammonium lactate (LAC-HYDRIN) 12 % lotion, Apply 1 Application topically as needed., Disp: , Rfl:    Ascorbic Acid (VITAMIN C) 1000 MG tablet, Take 1,000 mg by mouth daily., Disp: , Rfl:    calcium carbonate (TUMS - DOSED IN MG ELEMENTAL CALCIUM) 500 MG chewable tablet, Chew 1-2  tablets by mouth daily as needed for indigestion or heartburn., Disp: , Rfl:    cholecalciferol (VITAMIN D3) 25 MCG (1000 UNIT) tablet, Take 2,000 Units by mouth daily., Disp: , Rfl:    hydrochlorothiazide (HYDRODIURIL) 25 MG tablet, TAKE 1 TABLET BY MOUTH EVERY DAY AS NEEDED, Disp: 90 tablet, Rfl: 3   ibuprofen (ADVIL) 400 MG tablet, Take 400 mg by mouth every 6 (six) hours as needed for moderate pain., Disp: , Rfl:    KLOR-CON M20 20 MEQ tablet, TAKE 1 TABLET BY MOUTH EVERY DAY, Disp: 90 tablet, Rfl: 1   Menthol-Methyl Salicylate (SALONPAS PAIN RELIEF PATCH EX), Apply 1 patch topically daily as needed (pain)., Disp: , Rfl:    Multiple Vitamins-Minerals (MULTIVITAMIN WITH MINERALS) tablet, Take 1 tablet by mouth daily., Disp: , Rfl:    Pyridoxine  HCl (VITAMIN B6) 100 MG TABS, Take 100 mg by mouth daily., Disp: , Rfl:   Review of Systems: Denies appetite changes, fevers, chills, fatigue, unexplained weight changes. Denies hearing loss, neck lumps or masses, mouth sores, ringing in ears or voice changes. Denies cough or wheezing.  Denies shortness of breath. Denies chest pain or palpitations. Denies leg swelling. Denies abdominal distention, pain, blood in stools, constipation, diarrhea, nausea, vomiting, or early satiety. Denies pain with intercourse, dysuria, frequency, hematuria or incontinence. Denies hot flashes, pelvic pain, vaginal bleeding or vaginal discharge.   Denies itching, rash, or wounds. Denies dizziness, headaches, numbness or seizures. Denies swollen lymph nodes or glands, denies easy bruising or bleeding. Denies anxiety, depression, confusion, or decreased concentration.  Physical Exam: BP (!) 144/77 (BP Location: Left Arm, Patient Position: Sitting) Comment: Rechecked notified rn  Pulse 69   Temp 97.7 F (36.5 C) (Oral)   Resp 20   Wt 174 lb 3.2 oz (79 kg)   SpO2 99%   BMI 31.86 kg/m  General: Alert, oriented, no acute distress. HEENT: Normocephalic, atraumatic,  sclera anicteric. Chest: Clear to auscultation bilaterally.  No wheezes or rhonchi. Cardiovascular: Regular rate and rhythm, no murmurs. Abdomen: soft, nontender.  Normoactive bowel sounds.  No masses or hepatosplenomegaly appreciated.   Extremities: Grossly normal range of motion.  Warm, well perfused.  No edema bilaterally. Skin: No rashes or lesions noted. Lymphatics: No cervical, supraclavicular, or inguinal adenopathy. GU: External female genitalia notable for changes from prior surgery along the right vulva.  There is mild discoloration along the most inferior aspect of the incision, stable.   No other areas of discoloration, lesions, or atypical vasculature.   No new lesions.  Moderate atrophy of the vulva, loss of posterior labia bilaterally.  On vaginal exam, cervix normal appearing.  No vaginal lesions noted.  Laboratory & Radiologic Studies: CT A/P on 11/19: Formal read pending.  No significant change on my review.  Assessment & Plan: Ariel Rios is a 67 y.o. woman with recurrent vulvar melanoma, treated with excision of inguinal lymph node (09/2022), now on nivolumab (started in 10/2022).  Imaging in 12/2022 with enlarged right inguinal lymph node, resolved as of 03/2023 imaging.    The patient is overall doing well, tolerating immunotherapy better than she did after her initial diagnosis when she was on pembrolizumab.  She received cycle 10 of nivolumab on 10/29.  Discussed recent CT.   No findings on the vulva concerning for disease recurrence.   I will plan to see the patient back for 47-month follow-up and exam.  20 minutes of total time was spent for this patient encounter, including preparation, face-to-face counseling with the patient and coordination of care, and documentation of the encounter.  Eugene Garnet, MD  Division of Gynecologic Oncology  Department of Obstetrics and Gynecology  [facility]

## 2023-08-18 ENCOUNTER — Other Ambulatory Visit: Payer: Self-pay

## 2023-08-21 ENCOUNTER — Inpatient Hospital Stay: Payer: Medicare HMO

## 2023-08-21 ENCOUNTER — Encounter: Payer: Self-pay | Admitting: Hematology and Oncology

## 2023-08-21 ENCOUNTER — Inpatient Hospital Stay (HOSPITAL_BASED_OUTPATIENT_CLINIC_OR_DEPARTMENT_OTHER): Payer: Medicare HMO | Admitting: Hematology and Oncology

## 2023-08-21 VITALS — BP 124/75 | HR 73 | Temp 98.2°F | Resp 18 | Ht 62.0 in | Wt 176.8 lb

## 2023-08-21 VITALS — BP 106/66 | HR 72 | Resp 18

## 2023-08-21 DIAGNOSIS — C519 Malignant neoplasm of vulva, unspecified: Secondary | ICD-10-CM

## 2023-08-21 DIAGNOSIS — M353 Polymyalgia rheumatica: Secondary | ICD-10-CM | POA: Diagnosis not present

## 2023-08-21 DIAGNOSIS — Z5112 Encounter for antineoplastic immunotherapy: Secondary | ICD-10-CM | POA: Diagnosis not present

## 2023-08-21 LAB — CMP (CANCER CENTER ONLY)
ALT: 13 U/L (ref 0–44)
AST: 16 U/L (ref 15–41)
Albumin: 4.4 g/dL (ref 3.5–5.0)
Alkaline Phosphatase: 57 U/L (ref 38–126)
Anion gap: 9 (ref 5–15)
BUN: 20 mg/dL (ref 8–23)
CO2: 28 mmol/L (ref 22–32)
Calcium: 9.9 mg/dL (ref 8.9–10.3)
Chloride: 103 mmol/L (ref 98–111)
Creatinine: 0.56 mg/dL (ref 0.44–1.00)
GFR, Estimated: >60 mL/min (ref >60)
Glucose, Bld: 128 mg/dL — ABNORMAL HIGH (ref 70–99)
Potassium: 3.2 mmol/L — ABNORMAL LOW (ref 3.5–5.1)
Sodium: 140 mmol/L (ref 135–145)
Total Bilirubin: 0.8 mg/dL (ref <1.2)
Total Protein: 7.3 g/dL (ref 6.5–8.1)

## 2023-08-21 LAB — CBC WITH DIFFERENTIAL (CANCER CENTER ONLY)
Abs Immature Granulocytes: 0.01 10*3/uL (ref 0.00–0.07)
Basophils Absolute: 0 10*3/uL (ref 0.0–0.1)
Basophils Relative: 1 %
Eosinophils Absolute: 0.2 10*3/uL (ref 0.0–0.5)
Eosinophils Relative: 3 %
HCT: 36.8 % (ref 36.0–46.0)
Hemoglobin: 12 g/dL (ref 12.0–15.0)
Immature Granulocytes: 0 %
Lymphocytes Relative: 25 %
Lymphs Abs: 1.7 10*3/uL (ref 0.7–4.0)
MCH: 29.8 pg (ref 26.0–34.0)
MCHC: 32.6 g/dL (ref 30.0–36.0)
MCV: 91.3 fL (ref 80.0–100.0)
Monocytes Absolute: 0.4 10*3/uL (ref 0.1–1.0)
Monocytes Relative: 6 %
Neutro Abs: 4.4 10*3/uL (ref 1.7–7.7)
Neutrophils Relative %: 65 %
Platelet Count: 298 10*3/uL (ref 150–400)
RBC: 4.03 MIL/uL (ref 3.87–5.11)
RDW: 13.6 % (ref 11.5–15.5)
WBC Count: 6.7 10*3/uL (ref 4.0–10.5)
nRBC: 0 % (ref 0.0–0.2)

## 2023-08-21 LAB — TSH: TSH: 3.305 u[IU]/mL (ref 0.350–4.500)

## 2023-08-21 MED ORDER — SODIUM CHLORIDE 0.9 % IV SOLN: Freq: Once | INTRAVENOUS | Status: AC

## 2023-08-21 MED ORDER — SODIUM CHLORIDE 0.9 % IV SOLN
480.0000 mg | Freq: Once | INTRAVENOUS | Status: AC
  Administered 2023-08-21: 480 mg via INTRAVENOUS
  Filled 2023-08-21: qty 48

## 2023-08-21 NOTE — Assessment & Plan Note (Signed)
She will continue acetaminophen and ibuprofen as needed

## 2023-08-21 NOTE — Patient Instructions (Signed)
 [facility] - A DEPT OF [facility]  Discharge Instructions: Thank you for choosing [facility] to provide your oncology and hematology care.   If you have a lab appointment with the Cancer Center, please go directly to the Cancer Center and check in at the registration area.   Wear comfortable clothing and clothing appropriate for easy access to any Portacath or PICC line.   We strive to give you quality time with your provider. You may need to reschedule your appointment if you arrive late (15 or more minutes).  Arriving late affects you and other patients whose appointments are after yours.  Also, if you miss three or more appointments without notifying the office, you may be dismissed from the clinic at the provider's discretion.      For prescription refill requests, have your pharmacy contact our office and allow 72 hours for refills to be completed.    Today you received the following chemotherapy and/or immunotherapy agents: Opdivo.       To help prevent nausea and vomiting after your treatment, we encourage you to take your nausea medication as directed.  BELOW ARE SYMPTOMS THAT SHOULD BE REPORTED IMMEDIATELY: *FEVER GREATER THAN 100.4 F (38 C) OR HIGHER *CHILLS OR SWEATING *NAUSEA AND VOMITING THAT IS NOT CONTROLLED WITH YOUR NAUSEA MEDICATION *UNUSUAL SHORTNESS OF BREATH *UNUSUAL BRUISING OR BLEEDING *URINARY PROBLEMS (pain or burning when urinating, or frequent urination) *BOWEL PROBLEMS (unusual diarrhea, constipation, pain near the anus) TENDERNESS IN MOUTH AND THROAT WITH OR WITHOUT PRESENCE OF ULCERS (sore throat, sores in mouth, or a toothache) UNUSUAL RASH, SWELLING OR PAIN  UNUSUAL VAGINAL DISCHARGE OR ITCHING   Items with * indicate a potential emergency and should be followed up as soon as possible or go to the Emergency Department if any problems should occur.  Please show the CHEMOTHERAPY ALERT CARD or IMMUNOTHERAPY  ALERT CARD at check-in to the Emergency Department and triage nurse.  Should you have questions after your visit or need to cancel or reschedule your appointment, please contact [facility] - A DEPT OF [facility]  Dept: [phone]  and follow the prompts.  Office hours are 8:00 a.m. to 4:30 p.m. Monday - Friday. Please note that voicemails left after 4:00 p.m. may not be returned until the following business day.  We are closed weekends and major holidays. You have access to a nurse at all times for urgent questions. Please call the main number to the clinic Dept: [phone] and follow the prompts.   For any non-urgent questions, you may also contact your provider using MyChart. We now offer e-Visits for anyone 52 and older to request care online for non-urgent symptoms. For details visit [email or web address].   Also download the MyChart app! Go to the app store, search "MyChart", open the app, select [facility], and log in with your MyChart username and password.

## 2023-08-21 NOTE — Assessment & Plan Note (Signed)
I personally reviewed her recent CT imaging from November 2024 She has no signs of cancer recurrence She will complete immunotherapy next month Moving forward, in the future, she will just follow-up with GYN surgeon every 3 months for physical examination and periodic CT imaging as needed Side effects of her immunotherapy should subside within 6 to 8 weeks after completion of last dose of immunotherapy

## 2023-08-21 NOTE — Progress Notes (Signed)
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   All questions were answered. The patient knows to call the clinic with any problems, questions or concerns. The total time spent in the appointment was 30 minutes encounter with patients including review of chart and various tests results, discussions about plan of care and coordination of care plan   Artis Delay, MD 08/21/2023 10:24 AM  INTERVAL HISTORY: Please see below for problem oriented charting. she returns for treatment and toxicity monitoring while on immunotherapy She was seen by GYN surgeon recently with negative exam I reviewed her recent CT imaging Overall, she tolerated treatment well although she does have intermittent stiffness and symptoms of polymyalgia without presence of steroids She denies other new side effects of treatment She will complete adjuvant treatment next month We discussed future follow-up  REVIEW OF SYSTEMS:   Constitutional: Denies fevers, chills or abnormal weight loss Eyes: Denies blurriness of vision Ears, nose, mouth, throat, and face: Denies mucositis or sore throat Respiratory: Denies cough, dyspnea or wheezes Cardiovascular: Denies palpitation, chest discomfort or lower extremity swelling Gastrointestinal:  Denies  nausea, heartburn or change in bowel habits Skin: Denies abnormal skin rashes Lymphatics: Denies new lymphadenopathy or easy bruising Neurological:Denies numbness, tingling or new weaknesses Behavioral/Psych: Mood is stable, no new changes  All other systems were reviewed with the patient and are negative.  I have reviewed the past medical history, past surgical history, social history and family history with the patient and they are unchanged from previous note.  ALLERGIES:  has No Known Allergies.  MEDICATIONS:  Current Outpatient Medications  Medication Sig Dispense Refill   acetaminophen (TYLENOL) 650 MG CR tablet Take 650 mg by mouth every 8 (eight) hours as needed for pain.     ammonium lactate (LAC-HYDRIN) 12 % lotion Apply 1 Application topically as needed.     Ascorbic Acid (VITAMIN C) 1000 MG tablet Take 1,000 mg by mouth daily.     calcium carbonate (TUMS - DOSED IN MG ELEMENTAL CALCIUM) 500 MG chewable tablet Chew 1-2 tablets by mouth daily as needed for indigestion or heartburn.     cholecalciferol (VITAMIN D3) 25 MCG (1000 UNIT) tablet Take 2,000 Units by mouth daily.     hydrochlorothiazide (HYDRODIURIL) 25 MG tablet TAKE 1 TABLET BY MOUTH EVERY DAY AS NEEDED 90 tablet 3   ibuprofen (ADVIL) 400 MG tablet Take 400 mg by mouth every 6 (six) hours as needed for moderate pain.     KLOR-CON M20 20 MEQ tablet TAKE 1 TABLET BY MOUTH EVERY DAY 90 tablet 1   Menthol-Methyl Salicylate (SALONPAS PAIN RELIEF PATCH EX) Apply 1 patch topically daily as needed (pain).     Multiple Vitamins-Minerals (MULTIVITAMIN WITH MINERALS) tablet Take 1 tablet by mouth daily.     Pyridoxine HCl (VITAMIN B6) 100 MG TABS Take 100 mg by  mouth daily.     No current facility-administered medications for this visit.   Facility-Administered Medications Ordered in Other Visits  Medication Dose Route Frequency Provider Last Rate Last Admin   nivolumab (OPDIVO) 480 mg in sodium chloride 0.9 % 100 mL chemo  infusion  480 mg Intravenous Once Bertis Ruddy, Martyn Timme, MD 296 mL/hr at 08/21/23 1017 480 mg at 08/21/23 1017    SUMMARY OF ONCOLOGIC HISTORY: Oncology History Overview Note  BRAF: undetectable (negative)   Melanoma of vulva (HCC)  10/26/2020 Initial Diagnosis   She was noted to have bloody discharge in her underwear.  She denies pain   12/07/2020 Initial Biopsy   FINAL MICROSCOPIC DIAGNOSIS:   A.   RIGHT VULVA, BIOPSY: PRIMARY MALIGNANT MELANOMA OF VULVA, MELANOMA  TABLE BELOW  MALIGNANT MELANOMA (AJCC 8TH EDITION):  PROCEDURE: BIOPSY  SPECIMEN ANATOMIC SITE: RIGHT VULVA  BRESLOW'S DEPTH/MAXIMUM TUMOR THICKNESS: 5.05 MM  CLARK/ANATOMIC LEVEL* IV  MARGINS:  PERIPHERAL: INVOLVED  DEEP: FREE  ULCERATION: PRESENT  MITOTIC INDEX: 11/mm2  LYMPHO-VASCULAR INVASION: PRESENT*  NEUTROTROPISM: ABSENT  TUMOR-INFILTRATING LYMPHOCYTES: NON-BRISK  LYMPH NODES: N/A  PATHOLOGIC STAGE: PT4B NX MX  *VULVAR MELANOMAS MAY NOT HAVE THE SAME BEHAVIOR STAGE FOR STAGE AS SUPERFICIAL SPREADING CUTANEOUS MELANOMA, AND LANDMARKS SUCH AS BRESLOW'S DEPTH MAY NOT APPLY. NONETHELESS, THIS SHOULD BE TREATED AS A PT4B MELANOMA AND COMPLETE EXCISION, WITH POSSIBLE LYMPH NODE EVALUATION, UNDERTAKEN. SEE COMMENT FOR IHC PROFILE.    COMMENT:  Sections show confluent and nested growth of atypical melanocytes along vulvar epithelium in an in-situ melanoma growth pattern at the periphery, with a central large ulcerated nodule of similar atypical  melanocytes, consonant with primary vulvar melanoma. Notably, IHC was performed to evaluate the lineage and is diagnostic for melanoma. Lesional melanocytes are positive with S100, MelanA, and show a high ki-67 uptake, and are negative with CDX-2, synaptophysin, CK20, TTF-1, CK7, CD56, and lymphoid markers CD5, CD3, CD20 highlight only background lymphocytes.    12/17/2020 Initial Diagnosis   Melanoma of vulva (HCC)   12/17/2020 Cancer Staging   Staging form: Melanoma of the Skin, AJCC  8th Edition - Clinical stage from 12/17/2020: Stage III (rcT4b, cN2, cM0) - Signed by Artis Delay, MD on 10/13/2022 Stage prefix: Recurrence   12/30/2020 PET scan   1. The only potential hypermetabolic finding of note is a small flat focus of accentuated metabolic activity along the anterior vaginal vestibule/perineum, maximum SUV 6.5. Most common cause for this type of appearance would be a trace amount of incontinence of urinary FDG, but given the proximity to the reported vulvar melanoma, this small focus is considered indeterminate. 2. Other imaging findings of potential clinical significance: Aortic Atherosclerosis (ICD10-I70.0). Small type 1 hiatal hernia. Palatine tonsilloliths. Colonic diverticulosis.   01/18/2021 Surgery   Preoperative Diagnosis: Vulvar melanoma  Postoperative Diagnosis: Same  Procedures performed: Wide radical vulvectomy, R inguinofemoral sentinel LN biopsy  Attending Surgeon: Eugene Garnet, MD  Fellow: Leticia Clas  Resident: Georgana Curio, MD; Sallee Provencal, MD  Anesthesia: General endotracheal  Findings:  1. Vulva with biopsy site changes at R labia minora adjacent to the clitoris. No visible tumor. 2. Inguinofemoral nodes not clinically enlarged or abnormal.   Specimens:  1. R inguinal sentinel LN, hot and blue 2. Portion of vulva 3. Superior deep margin    01/18/2021 Pathology Results   Only melanoma in situ seen in final resection, SLN negative   02/08/2021 - 03/22/2021 Chemotherapy         03/29/2021 Genetic Testing  Negative genetic testing on the Multi-cancer +RNA panel.  The report date is March 29, 2021.  The Multi-Gene Panel offered by [facility] includes sequencing and/or deletion duplication testing of the following 84 genes: AIP, ALK, APC, ATM, AXIN2,BAP1,  BARD1, BLM, BMPR1A, BRCA1, BRCA2, BRIP1, CASR, CDC73, CDH1, CDK4, CDKN1B, CDKN1C, CDKN2A (p14ARF), CDKN2A (p16INK4a), CEBPA, CHEK2, CTNNA1, DICER1, DIS3L2, EGFR (c.2369C>T,  p.Thr790Met variant only), EPCAM (Deletion/duplication testing only), FH, FLCN, GATA2, GPC3, GREM1 (Promoter region deletion/duplication testing only), HOXB13 (c.251G>A, p.Gly84Glu), HRAS, KIT, MAX, MEN1, MET, MITF (c.952G>A, p.Glu318Lys variant only), MLH1, MSH2, MSH3, MSH6, MUTYH, NBN, NF1, NF2, NTHL1, PALB2, PDGFRA, PHOX2B, PMS2, POLD1, POLE, POT1, PRKAR1A, PTCH1, PTEN, RAD50, RAD51C, RAD51D, RB1, RECQL4, RET, RUNX1, SDHAF2, SDHA (sequence changes only), SDHB, SDHC, SDHD, SMAD4, SMARCA4, SMARCB1, SMARCE1, STK11, SUFU, TERC, TERT, TMEM127, TP53, TSC1, TSC2, VHL, WRN and WT1.     04/29/2021 PET scan   1. No vulvar mass or residual hypermetabolism. 2. No findings suspicious for metastatic disease.   11/28/2021 Pathology Results   Vulvar biopsy - fibrosis c/w scar. Small junctional nevus. No melanoma identified.   08/31/2022 PET scan   1. Enlarging RIGHT inguinal lymph node suspicious for metastatic disease. This shows only mild to moderate increased metabolic activity but displays FDG uptake greater than blood pool and shows abnormal morphology. Tissue sampling may be helpful. 2. No additional signs of disease in the neck, chest, abdomen or pelvis. 3. Signs of pelvic floor dysfunction and probable cystocele. 4. Chronic fracture of RIGHT inferior pubic ramus signs of healing since previous imaging. 5. Aortic atherosclerosis. 6. Moderate to large hiatal hernia.   Aortic Atherosclerosis (ICD10-I70.0).   09/04/2022 Pathology Results   FINAL MICROSCOPIC DIAGNOSIS:   A. LYMPH NODE, RIGHT INGUINAL, BIOPSY:  Metastatic melanoma.  See comment.   COMMENT:  The tumor cells are loosely cohesive with foci of cytoplasmic pigment. Immunohistochemistry is positive with Melan-A, S100 and SOX10.  The morphology and immunophenotype are consistent with metastatic melanoma.      09/27/2022 Surgery   Surgery: Excision of enlarged right inguinal lymph node   Operative findings: 2 cm palpable but mobile right  inguinal lymph node, hyperpigmented. No other palpable adenopathy.   11/13/2022 -  Chemotherapy   Patient is on Treatment Plan : MELANOMA Nivolumab (480) q28d     01/03/2023 Imaging   1. Enlarged right inguinal lymph node measuring 1.1 cm in short axis, similar to prior PET-CT. 2. No other potential sites of metastatic disease noted elsewhere in the chest, abdomen or pelvis. 3. Moderate-sized hiatal hernia. 4. Colonic diverticulosis without evidence of acute diverticulitis at this time. 5. Aortic atherosclerosis. 6. Additional incidental findings, as above.     03/26/2023 Imaging   CT ABDOMEN PELVIS W CONTRAST  Result Date: 03/27/2023 CLINICAL DATA:  Melanoma, assess treatment response * Tracking Code: BO * EXAM: CT ABDOMEN AND PELVIS WITH CONTRAST TECHNIQUE: Multidetector CT imaging of the abdomen and pelvis was performed using the standard protocol following bolus administration of intravenous contrast. RADIATION DOSE REDUCTION: This exam was performed according to the departmental dose-optimization program which includes automated exposure control, adjustment of the mA and/or kV according to patient size and/or use of iterative reconstruction technique. CONTRAST:  OMNIPAQUE IOHEXOL 300 MG/ML  SOLN COMPARISON:  CT scan chest, abdomen and pelvis from 01/01/2023. FINDINGS: Lower chest: There are subpleural atelectatic changes in the visualized lung bases. No overt consolidation. No pleural effusion. The heart is normal in size. No pericardial effusion. 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 Non-cirrhotic configuration. No suspicious mass. Note is made of a lobulated 2.4 x 4.2 cm cyst in the left hepatic lobe, stable since the prior study. There is a stable subcentimeter hypoattenuating focus in the left hepatic lobe, segment 4B, which is too small to adequately characterize but appears unchanged since the prior study and favored  benign as well. No intrahepatic or extrahepatic bile duct dilation. No calcified gallstones. Normal gallbladder wall thickness. No pericholecystic inflammatory changes. Pancreas: Unremarkable. No pancreatic ductal dilatation or surrounding inflammatory changes. Spleen: Within normal limits. No focal lesion. Adrenals/Urinary Tract: Adrenal glands are unremarkable. No suspicious renal mass. Redemonstration of several sinus cysts in the left kidney. No hydronephrosis. No renal or ureteric calculi. Urinary bladder is under distended, precluding optimal assessment. However, no large mass or stones identified. No perivesical fat stranding. Stomach/Bowel: No disproportionate dilation of the small or large bowel loops. No evidence of abnormal bowel wall thickening or inflammatory changes. The appendix is unremarkable. There are multiple diverticula throughout the colon, without imaging signs of diverticulitis. There is a small-to-moderate hiatal hernia. Vascular/Lymphatic: No ascites or pneumoperitoneum. No abdominal or pelvic lymphadenopathy, by size criteria. Previously noted right inguinal lymph node (series 2, image 110 of study from 01/01/2023) adjacent to the deepest surgical staple is no longer seen on this exam. There was also another smaller lymph node (series 2, image 112 of study dated 01/01/2023), which is also not seen on today's exam. No aneurysmal dilation of the major abdominal arteries. Reproductive: The uterus is unremarkable. No large adnexal mass. Other: There is a tiny fat containing umbilical hernia. There are metallic staples and postsurgical changes in the right inguinal region. Musculoskeletal: No suspicious osseous lesions. There are mild multilevel degenerative changes in the visualized spine. IMPRESSION: 1. No evidence of metastatic melanoma in the abdomen or pelvis. Previously noted right inguinal lymph nodes are no longer seen on today's exam. 2. Multiple other nonacute observations, as  described above. Electronically Signed   By: Jules Schick M.D.   On: 03/27/2023 15:11      08/14/2023 Imaging   CT ABDOMEN PELVIS W CONTRAST  Result Date: 08/17/2023 CLINICAL DATA:  History of vulvar melanoma diagnosed in 2022 with prior recurrence in right inguinal lymph node, on immunotherapy. * Tracking Code: BO * EXAM: CT ABDOMEN AND PELVIS WITH CONTRAST TECHNIQUE: Multidetector CT imaging of the abdomen and pelvis was performed using the standard protocol following bolus administration of intravenous contrast. RADIATION DOSE REDUCTION: This exam was performed according to the departmental dose-optimization program which includes automated exposure control, adjustment of the mA and/or kV according to patient size and/or use of iterative reconstruction technique. CONTRAST:  OMNIPAQUE IOHEXOL 300 MG/ML  SOLN COMPARISON:  CT abdomen and pelvis dated 03/26/2023 FINDINGS: Lower chest: No focal consolidation or pulmonary nodule in the lung bases. No pleural effusion or pneumothorax demonstrated. Partially imaged heart size is normal. Hepatobiliary: Similar segment 2 cyst. Unchanged segment 4 subcentimeter hypodensity (2:27), too small to characterize but also likely cysts. No intra or extrahepatic biliary ductal dilation. Normal gallbladder. Pancreas: No focal lesions or main ductal dilation. Spleen: Normal in size without focal abnormality. Adrenals/Urinary Tract: No adrenal nodules. No suspicious renal mass, calculi or hydronephrosis. Simple left parapelvic cysts. No specific follow-up imaging recommended. No focal bladder wall thickening. Stomach/Bowel: Small hiatal hernia. Normal appearance of the stomach. No evidence of bowel wall thickening, distention, or inflammatory changes. Colonic diverticulosis without acute diverticulitis. Normal appendix. Vascular/Lymphatic: Aortic atherosclerosis. No enlarged  abdominal or pelvic lymph nodes. Reproductive: No adnexal masses. Other: No free fluid, fluid  collection, or free air. Musculoskeletal: No acute or abnormal lytic or blastic osseous lesions. Multilevel degenerative changes of the partially imaged thoracic and lumbar spine. Postsurgical changes of the right inguinal region. IMPRESSION: 1. No evidence of metastatic disease in the abdomen or pelvis. 2.  Aortic Atherosclerosis (ICD10-I70.0). Electronically Signed   By: Agustin Cree M.D.   On: 08/17/2023 14:16        PHYSICAL EXAMINATION: ECOG PERFORMANCE STATUS: 1 - Symptomatic but completely ambulatory  Vitals:   08/21/23 0825  BP: 124/75  Pulse: 73  Resp: 18  Temp: 98.2 F (36.8 C)  SpO2: 97%   Filed Weights   08/21/23 0825  Weight: 176 lb 12.8 oz (80.2 kg)    GENERAL:alert, no distress and comfortable  LABORATORY DATA:  I have reviewed the data as listed    Component Value Date/Time   NA 140 08/21/2023 0812   K 3.2 (L) 08/21/2023 0812   CL 103 08/21/2023 0812   CO2 28 08/21/2023 0812   GLUCOSE 128 (H) 08/21/2023 0812   BUN 20 08/21/2023 0812   CREATININE 0.56 08/21/2023 0812   CREATININE 0.82 08/23/2020 1048   CALCIUM 9.9 08/21/2023 0812   PROT 7.3 08/21/2023 0812   ALBUMIN 4.4 08/21/2023 0812   AST 16 08/21/2023 0812   ALT 13 08/21/2023 0812   ALKPHOS 57 08/21/2023 0812   BILITOT 0.8 08/21/2023 0812   GFRNONAA >60 08/21/2023 0812   GFRNONAA 77 08/23/2020 1048   GFRAA 89 08/23/2020 1048    No results found for: "SPEP", "UPEP"  Lab Results  Component Value Date   WBC 6.7 08/21/2023   NEUTROABS 4.4 08/21/2023   HGB 12.0 08/21/2023   HCT 36.8 08/21/2023   MCV 91.3 08/21/2023   PLT 298 08/21/2023      Chemistry      Component Value Date/Time   NA 140 08/21/2023 0812   K 3.2 (L) 08/21/2023 0812   CL 103 08/21/2023 0812   CO2 28 08/21/2023 0812   BUN 20 08/21/2023 0812   CREATININE 0.56 08/21/2023 0812   CREATININE 0.82 08/23/2020 1048      Component Value Date/Time   CALCIUM 9.9 08/21/2023 0812   ALKPHOS 57 08/21/2023 0812   AST 16 08/21/2023  0812   ALT 13 08/21/2023 0812   BILITOT 0.8 08/21/2023 0812       RADIOGRAPHIC STUDIES: I have personally reviewed the radiological images as listed and agreed with the findings in the report. CT ABDOMEN PELVIS W CONTRAST  Result Date: 08/17/2023 CLINICAL DATA:  History of vulvar melanoma diagnosed in 2022 with prior recurrence in right inguinal lymph node, on immunotherapy. * Tracking Code: BO * EXAM: CT ABDOMEN AND PELVIS WITH CONTRAST TECHNIQUE: Multidetector CT imaging of the abdomen and pelvis was performed using the standard protocol following bolus administration of intravenous contrast. RADIATION DOSE REDUCTION: This exam was performed according to the departmental dose-optimization program which includes automated exposure control, adjustment of the mA and/or kV according to patient size and/or use of iterative reconstruction technique. CONTRAST:  OMNIPAQUE IOHEXOL 300 MG/ML  SOLN COMPARISON:  CT abdomen and pelvis dated 03/26/2023 FINDINGS: Lower chest: No focal consolidation or pulmonary nodule in the lung bases. No pleural effusion or pneumothorax demonstrated. Partially imaged heart size is normal. Hepatobiliary: Similar segment 2 cyst. Unchanged segment 4 subcentimeter hypodensity (2:27), too small to characterize but also likely cysts. No intra or extrahepatic biliary  ductal dilation. Normal gallbladder. Pancreas: No focal lesions or main ductal dilation. Spleen: Normal in size without focal abnormality. Adrenals/Urinary Tract: No adrenal nodules. No suspicious renal mass, calculi or hydronephrosis. Simple left parapelvic cysts. No specific follow-up imaging recommended. No focal bladder wall thickening. Stomach/Bowel: Small hiatal hernia. Normal appearance of the stomach. No evidence of bowel wall thickening, distention, or inflammatory changes. Colonic diverticulosis without acute diverticulitis. Normal appendix. Vascular/Lymphatic: Aortic atherosclerosis. No enlarged abdominal or  pelvic lymph nodes. Reproductive: No adnexal masses. Other: No free fluid, fluid collection, or free air. Musculoskeletal: No acute or abnormal lytic or blastic osseous lesions. Multilevel degenerative changes of the partially imaged thoracic and lumbar spine. Postsurgical changes of the right inguinal region. IMPRESSION: 1. No evidence of metastatic disease in the abdomen or pelvis. 2.  Aortic Atherosclerosis (ICD10-I70.0). Electronically Signed   By: Agustin Cree M.D.   On: 08/17/2023 14:16

## 2023-08-22 LAB — T4: T4, Total: 8.3 ug/dL (ref 4.5–12.0)

## 2023-08-29 ENCOUNTER — Other Ambulatory Visit: Payer: Self-pay | Admitting: Family Medicine

## 2023-08-29 DIAGNOSIS — E876 Hypokalemia: Secondary | ICD-10-CM

## 2023-09-24 ENCOUNTER — Inpatient Hospital Stay: Payer: Medicare HMO | Attending: Gynecologic Oncology

## 2023-09-24 ENCOUNTER — Inpatient Hospital Stay: Payer: Medicare HMO

## 2023-09-24 VITALS — BP 132/74 | HR 77 | Temp 98.0°F | Resp 18 | Wt 174.5 lb

## 2023-09-24 DIAGNOSIS — Z5112 Encounter for antineoplastic immunotherapy: Secondary | ICD-10-CM | POA: Insufficient documentation

## 2023-09-24 DIAGNOSIS — Z79620 Long term (current) use of immunosuppressive biologic: Secondary | ICD-10-CM | POA: Insufficient documentation

## 2023-09-24 DIAGNOSIS — M353 Polymyalgia rheumatica: Secondary | ICD-10-CM | POA: Insufficient documentation

## 2023-09-24 DIAGNOSIS — C519 Malignant neoplasm of vulva, unspecified: Secondary | ICD-10-CM

## 2023-09-24 LAB — CMP (CANCER CENTER ONLY)
ALT: 14 U/L (ref 0–44)
AST: 17 U/L (ref 15–41)
Albumin: 4.3 g/dL (ref 3.5–5.0)
Alkaline Phosphatase: 54 U/L (ref 38–126)
Anion gap: 11 (ref 5–15)
BUN: 20 mg/dL (ref 8–23)
CO2: 28 mmol/L (ref 22–32)
Calcium: 10 mg/dL (ref 8.9–10.3)
Chloride: 103 mmol/L (ref 98–111)
Creatinine: 0.54 mg/dL (ref 0.44–1.00)
GFR, Estimated: >60 mL/min (ref >60)
Glucose, Bld: 104 mg/dL — ABNORMAL HIGH (ref 70–99)
Potassium: 2.9 mmol/L — ABNORMAL LOW (ref 3.5–5.1)
Sodium: 142 mmol/L (ref 135–145)
Total Bilirubin: 0.6 mg/dL (ref <1.2)
Total Protein: 7.2 g/dL (ref 6.5–8.1)

## 2023-09-24 LAB — CBC WITH DIFFERENTIAL (CANCER CENTER ONLY)
Abs Immature Granulocytes: 0.01 10*3/uL (ref 0.00–0.07)
Basophils Absolute: 0 10*3/uL (ref 0.0–0.1)
Basophils Relative: 1 %
Eosinophils Absolute: 0.2 10*3/uL (ref 0.0–0.5)
Eosinophils Relative: 5 %
HCT: 35.9 % — ABNORMAL LOW (ref 36.0–46.0)
Hemoglobin: 11.8 g/dL — ABNORMAL LOW (ref 12.0–15.0)
Immature Granulocytes: 0 %
Lymphocytes Relative: 33 %
Lymphs Abs: 1.7 10*3/uL (ref 0.7–4.0)
MCH: 29.2 pg (ref 26.0–34.0)
MCHC: 32.9 g/dL (ref 30.0–36.0)
MCV: 88.9 fL (ref 80.0–100.0)
Monocytes Absolute: 0.4 10*3/uL (ref 0.1–1.0)
Monocytes Relative: 7 %
Neutro Abs: 2.8 10*3/uL (ref 1.7–7.7)
Neutrophils Relative %: 54 %
Platelet Count: 316 10*3/uL (ref 150–400)
RBC: 4.04 MIL/uL (ref 3.87–5.11)
RDW: 13.1 % (ref 11.5–15.5)
WBC Count: 5.1 10*3/uL (ref 4.0–10.5)
nRBC: 0 % (ref 0.0–0.2)

## 2023-09-24 LAB — TSH: TSH: 5.96 u[IU]/mL — ABNORMAL HIGH (ref 0.350–4.500)

## 2023-09-24 MED ORDER — SODIUM CHLORIDE 0.9 % IV SOLN: Freq: Once | INTRAVENOUS | Status: AC

## 2023-09-24 MED ORDER — SODIUM CHLORIDE 0.9 % IV SOLN
480.0000 mg | Freq: Once | INTRAVENOUS | Status: AC
  Administered 2023-09-24: 480 mg via INTRAVENOUS
  Filled 2023-09-24: qty 48

## 2023-09-24 NOTE — Patient Instructions (Signed)
 [facility] - A DEPT OF [facility]  Discharge Instructions: Thank you for choosing [facility] to provide your oncology and hematology care.   If you have a lab appointment with the Cancer Center, please go directly to the Cancer Center and check in at the registration area.   Wear comfortable clothing and clothing appropriate for easy access to any Portacath or PICC line.   We strive to give you quality time with your provider. You may need to reschedule your appointment if you arrive late (15 or more minutes).  Arriving late affects you and other patients whose appointments are after yours.  Also, if you miss three or more appointments without notifying the office, you may be dismissed from the clinic at the provider's discretion.      For prescription refill requests, have your pharmacy contact our office and allow 72 hours for refills to be completed.    Today you received the following chemotherapy and/or immunotherapy agents: Opdivo.       To help prevent nausea and vomiting after your treatment, we encourage you to take your nausea medication as directed.  BELOW ARE SYMPTOMS THAT SHOULD BE REPORTED IMMEDIATELY: *FEVER GREATER THAN 100.4 F (38 C) OR HIGHER *CHILLS OR SWEATING *NAUSEA AND VOMITING THAT IS NOT CONTROLLED WITH YOUR NAUSEA MEDICATION *UNUSUAL SHORTNESS OF BREATH *UNUSUAL BRUISING OR BLEEDING *URINARY PROBLEMS (pain or burning when urinating, or frequent urination) *BOWEL PROBLEMS (unusual diarrhea, constipation, pain near the anus) TENDERNESS IN MOUTH AND THROAT WITH OR WITHOUT PRESENCE OF ULCERS (sore throat, sores in mouth, or a toothache) UNUSUAL RASH, SWELLING OR PAIN  UNUSUAL VAGINAL DISCHARGE OR ITCHING   Items with * indicate a potential emergency and should be followed up as soon as possible or go to the Emergency Department if any problems should occur.  Please show the CHEMOTHERAPY ALERT CARD or IMMUNOTHERAPY  ALERT CARD at check-in to the Emergency Department and triage nurse.  Should you have questions after your visit or need to cancel or reschedule your appointment, please contact [facility] - A DEPT [facility]  Dept: [phone]  and follow the prompts.  Office hours are 8:00 a.m. to 4:30 p.m. Monday - Friday. Please note that voicemails left after 4:00 p.m. may not be returned until the following business day.  We are closed weekends and major holidays. You have access to a nurse at all times for urgent questions. Please call the main number to the clinic Dept: [phone] and follow the prompts.   For any non-urgent questions, you may also contact your provider using MyChart. We now offer e-Visits for anyone 45 and older to request care online for non-urgent symptoms. For details visit [email or web address].   Also download the MyChart app! Go to the app store, search "MyChart", open the app, select [place], and log in with your MyChart username and password.

## 2023-09-24 NOTE — Progress Notes (Signed)
Patient has potassium lab results of 2.9 today. Patient confirms she takes her Klor-Con once a day. Per Dr. Al Pimple (On call MD) Patient will increase her PO potassium to take twice a day for the next three days. Patient verbalized an understanding and requested message and lab results forwarded to her PCP- Dr. [place]. Staff message was sent by this RN.

## 2023-09-25 LAB — T4: T4, Total: 9.2 ug/dL (ref 4.5–12.0)

## 2023-09-27 ENCOUNTER — Telehealth: Payer: Self-pay

## 2023-09-27 NOTE — Telephone Encounter (Signed)
 Called and left a message asking her to call the office back. Per Dr. Lonn, her TSH is elevated. She had last dose treatment last month.  Typically I anticipate it will be back to normal since we have stopped treatment.  Can you ask if she can get her PCP to monitor in 3 mo? Or we have to get labs checked her when she returns to see Dr. Viktoria in Feb.

## 2023-09-27 NOTE — Telephone Encounter (Signed)
 She called back and given below message. She verbalized understanding and will follow up with PCP if she can get appt. She will call the office back if lab appt needed prior to seeing Dr. Pricilla Holm.

## 2023-10-07 ENCOUNTER — Other Ambulatory Visit: Payer: Self-pay | Admitting: Family Medicine

## 2023-10-07 DIAGNOSIS — M353 Polymyalgia rheumatica: Secondary | ICD-10-CM

## 2023-10-24 ENCOUNTER — Telehealth: Payer: Self-pay

## 2023-10-24 DIAGNOSIS — E876 Hypokalemia: Secondary | ICD-10-CM

## 2023-10-24 NOTE — Telephone Encounter (Signed)
Patient is already taking potassium supplement daily, will have patient come by office to have potassium rechecked.

## 2023-10-24 NOTE — Telephone Encounter (Signed)
----- Message from Betty Swaziland sent at 10/23/2023  7:05 AM EST ----- Regarding: FW: Lab Results Potassium was low on 09/24/23. I am not sure if she was given oral potassium at that time. She is on hydrochlorothiazide, which could be causing the problem. Options:Stop diuretic or add potassium supplementation. Potassium to be repeated 1-2 weeks after either option is started. Thanks, BJ ----- Message ----- From: Malissa Hippo, RN Sent: 09/24/2023   8:53 AM EST To: [name] Swaziland, MD Subject: Lab Results                                    Dr. Swaziland,  At the request of Mrs. Kapral, please see her lab results prior to today's treatment. Her potassium is lower than usual. We have advised her to take 2 of her Klor-Con tablets for the next 3 days. She may need a refill sooner than anticipated.   -CMP (Cancer Center only):      Status: Abnormal Collection Time: 09/24/23  7:23 AM      Result                                            Value                         Ref Range                      Sodium                                            142                           135 - 145 mmol/L               Potassium                                         2.9 (L)                       3.5 - 5.1 mmol/L               Chloride                                          103                           98 - 111 mmol/L                CO2                                               28  22 - 32 mmol/L                 Glucose, Bld                                      104 (H)                       70 - 99 mg/dL                  BUN                                               20                            8 - 23 mg/dL                   Creatinine                                        0.54                          0.44 - 1.00 mg/dL              Calcium                                           10.0                          8.9 - 10.3 mg/dL               Total Protein                                      7.2                           6.5 - 8.1 g/dL                 Albumin                                           4.3                           3.5 - 5.0 g/dL                 AST                                               17  15 - 41 U/L                    ALT                                               14                            0 - 44 U/L                     Alkaline Phosphatase                              54                            38 - 126 U/L                   Total Bilirubin                                   0.6                           <1.2 mg/dL                     GFR, Estimated                                    >60                           >60 mL/min                     Anion gap                                         11                            5 - 15                      [place],  [place]untley Dec, Charity fundraiser

## 2023-10-24 NOTE — Telephone Encounter (Signed)
I called and spoke with patient. Lab appointment made for 1/30 at 11am to recheck potassium.

## 2023-10-25 ENCOUNTER — Other Ambulatory Visit (INDEPENDENT_AMBULATORY_CARE_PROVIDER_SITE_OTHER): Payer: Medicare HMO

## 2023-10-25 DIAGNOSIS — E876 Hypokalemia: Secondary | ICD-10-CM

## 2023-10-25 LAB — POTASSIUM: Potassium: 3.6 meq/L (ref 3.5–5.1)

## 2023-10-27 ENCOUNTER — Encounter: Payer: Self-pay | Admitting: Family Medicine

## 2023-10-31 ENCOUNTER — Telehealth: Payer: Medicare HMO | Admitting: Physician Assistant

## 2023-10-31 DIAGNOSIS — H1033 Unspecified acute conjunctivitis, bilateral: Secondary | ICD-10-CM | POA: Diagnosis not present

## 2023-10-31 MED ORDER — POLYMYXIN B-TRIMETHOPRIM 10000-0.1 UNIT/ML-% OP SOLN: OPHTHALMIC | 0 refills | Status: DC

## 2023-10-31 NOTE — Progress Notes (Signed)
 I have spent 5 minutes in review of e-visit questionnaire, review and updating patient chart, medical decision making and response to patient.   Piedad Climes, PA-C

## 2023-10-31 NOTE — Progress Notes (Signed)

## 2023-11-05 ENCOUNTER — Telehealth: Payer: Medicare HMO | Admitting: Physician Assistant

## 2023-11-05 DIAGNOSIS — J069 Acute upper respiratory infection, unspecified: Secondary | ICD-10-CM

## 2023-11-05 MED ORDER — BENZONATATE 100 MG PO CAPS: 100.0000 mg | ORAL_CAPSULE | Freq: Three times a day (TID) | ORAL | 0 refills | Status: DC | PRN

## 2023-11-05 NOTE — Progress Notes (Signed)

## 2023-11-05 NOTE — Progress Notes (Signed)
 I have spent 5 minutes in review of e-visit questionnaire, review and updating patient chart, medical decision making and response to patient.   Piedad Climes, PA-C

## 2023-11-14 ENCOUNTER — Encounter: Payer: Self-pay | Admitting: Gynecologic Oncology

## 2023-11-16 ENCOUNTER — Inpatient Hospital Stay: Payer: Medicare HMO | Attending: Gynecologic Oncology | Admitting: Gynecologic Oncology

## 2023-11-16 ENCOUNTER — Encounter: Payer: Self-pay | Admitting: Gynecologic Oncology

## 2023-11-16 VITALS — BP 138/70 | HR 78 | Temp 98.2°F | Resp 19 | Wt 172.2 lb

## 2023-11-16 DIAGNOSIS — Z9221 Personal history of antineoplastic chemotherapy: Secondary | ICD-10-CM | POA: Diagnosis not present

## 2023-11-16 DIAGNOSIS — R059 Cough, unspecified: Secondary | ICD-10-CM | POA: Diagnosis not present

## 2023-11-16 DIAGNOSIS — Z9079 Acquired absence of other genital organ(s): Secondary | ICD-10-CM | POA: Diagnosis not present

## 2023-11-16 DIAGNOSIS — Z8544 Personal history of malignant neoplasm of other female genital organs: Secondary | ICD-10-CM | POA: Diagnosis not present

## 2023-11-16 DIAGNOSIS — C519 Malignant neoplasm of vulva, unspecified: Secondary | ICD-10-CM

## 2023-11-16 NOTE — Patient Instructions (Signed)
 It was good to see you today.  I do not see or feel any evidence of cancer recurrence on your exam.  I will see you for follow-up in 3 months.  As always, if you develop any new and concerning symptoms before your next visit, please call to see me sooner.

## 2023-11-16 NOTE — Progress Notes (Signed)
Gynecologic Oncology Return Clinic Visit  11/16/23  Reason for Visit: surveillance  Treatment History: Oncology History Overview Note  BRAF: undetectable (negative)   Melanoma of vulva (HCC)  10/26/2020 Initial Diagnosis   She was noted to have bloody discharge in her underwear.  She denies pain   12/07/2020 Initial Biopsy   FINAL MICROSCOPIC DIAGNOSIS:   A.   RIGHT VULVA, BIOPSY: PRIMARY MALIGNANT MELANOMA OF VULVA, MELANOMA  TABLE BELOW  MALIGNANT MELANOMA (AJCC 8TH EDITION):  PROCEDURE: BIOPSY  SPECIMEN ANATOMIC SITE: RIGHT VULVA  BRESLOW'S DEPTH/MAXIMUM TUMOR THICKNESS: 5.05 MM  CLARK/ANATOMIC LEVEL* IV  MARGINS:  PERIPHERAL: INVOLVED  DEEP: FREE  ULCERATION: PRESENT  MITOTIC INDEX: 11/mm2  LYMPHO-VASCULAR INVASION: PRESENT*  NEUTROTROPISM: ABSENT  TUMOR-INFILTRATING LYMPHOCYTES: NON-BRISK  LYMPH NODES: N/A  PATHOLOGIC STAGE: PT4B NX MX  *VULVAR MELANOMAS MAY NOT HAVE THE SAME BEHAVIOR STAGE FOR STAGE AS SUPERFICIAL SPREADING CUTANEOUS MELANOMA, AND LANDMARKS SUCH AS BRESLOW'S DEPTH MAY NOT APPLY. NONETHELESS, THIS SHOULD BE TREATED AS A PT4B MELANOMA AND COMPLETE EXCISION, WITH POSSIBLE LYMPH NODE EVALUATION, UNDERTAKEN. SEE COMMENT FOR IHC PROFILE.    COMMENT:  Sections show confluent and nested growth of atypical melanocytes along vulvar epithelium in an in-situ melanoma growth pattern at the periphery, with a central large ulcerated nodule of similar atypical  melanocytes, consonant with primary vulvar melanoma. Notably, IHC was performed to evaluate the lineage and is diagnostic for melanoma. Lesional melanocytes are positive with S100, MelanA, and show a high ki-67 uptake, and are negative with CDX-2, synaptophysin, CK20, TTF-1, CK7, CD56, and lymphoid markers CD5, CD3, CD20 highlight only background lymphocytes.    12/17/2020 Initial Diagnosis   Melanoma of vulva (HCC)   12/17/2020 Cancer Staging   Staging form: Melanoma of the Skin, AJCC 8th Edition - Clinical  stage from 12/17/2020: Stage III (rcT4b, cN2, cM0) - Signed by Artis Delay, MD on 10/13/2022 Stage prefix: Recurrence   12/30/2020 PET scan   1. The only potential hypermetabolic finding of note is a small flat focus of accentuated metabolic activity along the anterior vaginal vestibule/perineum, maximum SUV 6.5. Most common cause for this type of appearance would be a trace amount of incontinence of urinary FDG, but given the proximity to the reported vulvar melanoma, this small focus is considered indeterminate. 2. Other imaging findings of potential clinical significance: Aortic Atherosclerosis (ICD10-I70.0). Small type 1 hiatal hernia. Palatine tonsilloliths. Colonic diverticulosis.   01/18/2021 Surgery   Preoperative Diagnosis: Vulvar melanoma  Postoperative Diagnosis: Same  Procedures performed: Wide radical vulvectomy, R inguinofemoral sentinel LN biopsy  Attending Surgeon: Eugene Garnet, MD  Fellow: Leticia Clas  Resident: Georgana Curio, MD; Sallee Provencal, MD  Anesthesia: General endotracheal  Findings:  1. Vulva with biopsy site changes at R labia minora adjacent to the clitoris. No visible tumor. 2. Inguinofemoral nodes not clinically enlarged or abnormal.   Specimens:  1. R inguinal sentinel LN, hot and blue 2. Portion of vulva 3. Superior deep margin    01/18/2021 Pathology Results   Only melanoma in situ seen in final resection, SLN negative   02/08/2021 - 03/22/2021 Chemotherapy         03/29/2021 Genetic Testing   Negative genetic testing on the Multi-cancer +RNA panel.  The report date is March 29, 2021.  The Multi-Gene Panel offered by [facility] includes sequencing and/or deletion duplication testing of the following 84 genes: AIP, ALK, APC, ATM, AXIN2,BAP1,  BARD1, BLM, BMPR1A, BRCA1, BRCA2, BRIP1, CASR, CDC73, CDH1, CDK4, CDKN1B, CDKN1C, CDKN2A (p14ARF), CDKN2A (p16INK4a), CEBPA,  CHEK2, CTNNA1, DICER1, DIS3L2, EGFR (c.2369C>T, p.Thr790Met variant only),  EPCAM (Deletion/duplication testing only), FH, FLCN, GATA2, GPC3, GREM1 (Promoter region deletion/duplication testing only), HOXB13 (c.251G>A, p.Gly84Glu), HRAS, KIT, MAX, MEN1, MET, MITF (c.952G>A, p.Glu318Lys variant only), MLH1, MSH2, MSH3, MSH6, MUTYH, NBN, NF1, NF2, NTHL1, PALB2, PDGFRA, PHOX2B, PMS2, POLD1, POLE, POT1, PRKAR1A, PTCH1, PTEN, RAD50, RAD51C, RAD51D, RB1, RECQL4, RET, RUNX1, SDHAF2, SDHA (sequence changes only), SDHB, SDHC, SDHD, SMAD4, SMARCA4, SMARCB1, SMARCE1, STK11, SUFU, TERC, TERT, TMEM127, TP53, TSC1, TSC2, VHL, WRN and WT1.     04/29/2021 PET scan   1. No vulvar mass or residual hypermetabolism. 2. No findings suspicious for metastatic disease.   11/28/2021 Pathology Results   Vulvar biopsy - fibrosis c/w scar. Small junctional nevus. No melanoma identified.   08/31/2022 PET scan   1. Enlarging RIGHT inguinal lymph node suspicious for metastatic disease. This shows only mild to moderate increased metabolic activity but displays FDG uptake greater than blood pool and shows abnormal morphology. Tissue sampling may be helpful. 2. No additional signs of disease in the neck, chest, abdomen or pelvis. 3. Signs of pelvic floor dysfunction and probable cystocele. 4. Chronic fracture of RIGHT inferior pubic ramus signs of healing since previous imaging. 5. Aortic atherosclerosis. 6. Moderate to large hiatal hernia.   Aortic Atherosclerosis (ICD10-I70.0).   09/04/2022 Pathology Results   FINAL MICROSCOPIC DIAGNOSIS:   A. LYMPH NODE, RIGHT INGUINAL, BIOPSY:  Metastatic melanoma.  See comment.   COMMENT:  The tumor cells are loosely cohesive with foci of cytoplasmic pigment. Immunohistochemistry is positive with Melan-A, S100 and SOX10.  The morphology and immunophenotype are consistent with metastatic melanoma.      09/27/2022 Surgery   Surgery: Excision of enlarged right inguinal lymph node   Operative findings: 2 cm palpable but mobile right inguinal lymph node,  hyperpigmented. No other palpable adenopathy.   11/13/2022 -  Chemotherapy   Patient is on Treatment Plan : MELANOMA Nivolumab (480) q28d     01/03/2023 Imaging   1. Enlarged right inguinal lymph node measuring 1.1 cm in short axis, similar to prior PET-CT. 2. No other potential sites of metastatic disease noted elsewhere in the chest, abdomen or pelvis. 3. Moderate-sized hiatal hernia. 4. Colonic diverticulosis without evidence of acute diverticulitis at this time. 5. Aortic atherosclerosis. 6. Additional incidental findings, as above.     03/26/2023 Imaging   CT ABDOMEN PELVIS W CONTRAST  Result Date: 03/27/2023 CLINICAL DATA:  Melanoma, assess treatment response * Tracking Code: BO * EXAM: CT ABDOMEN AND PELVIS WITH CONTRAST TECHNIQUE: Multidetector CT imaging of the abdomen and pelvis was performed using the standard protocol following bolus administration of intravenous contrast. RADIATION DOSE REDUCTION: This exam was performed according to the departmental dose-optimization program which includes automated exposure control, adjustment of the mA and/or kV according to patient size and/or use of iterative reconstruction technique. CONTRAST:  OMNIPAQUE IOHEXOL 300 MG/ML  SOLN COMPARISON:  CT scan chest, abdomen and pelvis from 01/01/2023. FINDINGS: Lower chest: There are subpleural atelectatic changes in the visualized lung bases. No overt consolidation. No pleural effusion. The heart is normal in size. No pericardial effusion. 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 Non-cirrhotic configuration. No suspicious mass. Note is made of a lobulated 2.4 x 4.2 cm cyst in the left hepatic lobe, stable since the prior study. There is a stable subcentimeter hypoattenuating focus in the left hepatic lobe, segment 4B, which is too small to adequately characterize but appears unchanged since  the prior study and favored benign as well. No  intrahepatic or extrahepatic bile duct dilation. No calcified gallstones. Normal gallbladder wall thickness. No pericholecystic inflammatory changes. Pancreas: Unremarkable. No pancreatic ductal dilatation or surrounding inflammatory changes. Spleen: Within normal limits. No focal lesion. Adrenals/Urinary Tract: Adrenal glands are unremarkable. No suspicious renal mass. Redemonstration of several sinus cysts in the left kidney. No hydronephrosis. No renal or ureteric calculi. Urinary bladder is under distended, precluding optimal assessment. However, no large mass or stones identified. No perivesical fat stranding. Stomach/Bowel: No disproportionate dilation of the small or large bowel loops. No evidence of abnormal bowel wall thickening or inflammatory changes. The appendix is unremarkable. There are multiple diverticula throughout the colon, without imaging signs of diverticulitis. There is a small-to-moderate hiatal hernia. Vascular/Lymphatic: No ascites or pneumoperitoneum. No abdominal or pelvic lymphadenopathy, by size criteria. Previously noted right inguinal lymph node (series 2, image 110 of study from 01/01/2023) adjacent to the deepest surgical staple is no longer seen on this exam. There was also another smaller lymph node (series 2, image 112 of study dated 01/01/2023), which is also not seen on today's exam. No aneurysmal dilation of the major abdominal arteries. Reproductive: The uterus is unremarkable. No large adnexal mass. Other: There is a tiny fat containing umbilical hernia. There are metallic staples and postsurgical changes in the right inguinal region. Musculoskeletal: No suspicious osseous lesions. There are mild multilevel degenerative changes in the visualized spine. IMPRESSION: 1. No evidence of metastatic melanoma in the abdomen or pelvis. Previously noted right inguinal lymph nodes are no longer seen on today's exam. 2. Multiple other nonacute observations, as described above.  Electronically Signed   By: Jules Schick M.D.   On: 03/27/2023 15:11      08/14/2023 Imaging   CT ABDOMEN PELVIS W CONTRAST  Result Date: 08/17/2023 CLINICAL DATA:  History of vulvar melanoma diagnosed in 2022 with prior recurrence in right inguinal lymph node, on immunotherapy. * Tracking Code: BO * EXAM: CT ABDOMEN AND PELVIS WITH CONTRAST TECHNIQUE: Multidetector CT imaging of the abdomen and pelvis was performed using the standard protocol following bolus administration of intravenous contrast. RADIATION DOSE REDUCTION: This exam was performed according to the departmental dose-optimization program which includes automated exposure control, adjustment of the mA and/or kV according to patient size and/or use of iterative reconstruction technique. CONTRAST:  OMNIPAQUE IOHEXOL 300 MG/ML  SOLN COMPARISON:  CT abdomen and pelvis dated 03/26/2023 FINDINGS: Lower chest: No focal consolidation or pulmonary nodule in the lung bases. No pleural effusion or pneumothorax demonstrated. Partially imaged heart size is normal. Hepatobiliary: Similar segment 2 cyst. Unchanged segment 4 subcentimeter hypodensity (2:27), too small to characterize but also likely cysts. No intra or extrahepatic biliary ductal dilation. Normal gallbladder. Pancreas: No focal lesions or main ductal dilation. Spleen: Normal in size without focal abnormality. Adrenals/Urinary Tract: No adrenal nodules. No suspicious renal mass, calculi or hydronephrosis. Simple left parapelvic cysts. No specific follow-up imaging recommended. No focal bladder wall thickening. Stomach/Bowel: Small hiatal hernia. Normal appearance of the stomach. No evidence of bowel wall thickening, distention, or inflammatory changes. Colonic diverticulosis without acute diverticulitis. Normal appendix. Vascular/Lymphatic: Aortic atherosclerosis. No enlarged abdominal or pelvic lymph nodes. Reproductive: No adnexal masses. Other: No free fluid, fluid collection, or free  air. Musculoskeletal: No acute or abnormal lytic or blastic osseous lesions. Multilevel degenerative changes of the partially imaged thoracic and lumbar spine. Postsurgical changes of the right inguinal region. IMPRESSION: 1. No evidence of metastatic disease in the abdomen  or pelvis. 2.  Aortic Atherosclerosis (ICD10-I70.0). Electronically Signed   By: Agustin Cree M.D.   On: 08/17/2023 14:16        Interval History: Patient reports overall doing well.  From a polymyalgia rheumatic standpoint, she denies any joint pain.  Still feels like she has lower stamina, decreased flexibility, and more instability but no significant change since coming off of the nivolumab.  She denies any vulvar symptoms including pain, pruritus, new lesions, bleeding, or discharge.  Has had a cough for 3 weeks.  Was on various medications, has found allergy medication to be the most helpful.  Describes this as an upper airway cough.  Past Medical/Surgical History: 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   melanoma   Complication of anesthesia    hard to wake up after anesthesia   Family history of melanoma    History of kidney stones    Melanotic macule of vulvar labia    Migraine    Polymyalgia rheumatica (HCC)    Venous insufficiency     Past Surgical History:  Procedure Laterality Date   Cataract Right 05/22/2022   CATARACT EXTRACTION Left 06/05/2022   COLONOSCOPY     INGUINAL LYMPHADENECTOMY Right 09/27/2022   Procedure: RIGHT INGUINAL LYMPH NODE DEBULKING;  Surgeon: Carver Fila, MD;  Location: [facility];  Service: Gynecology;  Laterality: Right;   VULVECTOMY  01/19/2021   done at [facility], had sentinel node biopsy also   WRIST SURGERY  05/15/2023   Left    Family History  Problem Relation Age of Onset   Diabetes Mother    AVM Father    Melanoma Father        multiple melanomas dx  between 50-90   Cancer Father 76       tumor on kidney   Cancer Paternal Grandfather        laryngeal cancer   Cancer Daughter 53       appendiceal cancer   Breast cancer Neg Hx    Ovarian cancer Neg Hx    Colon cancer Neg Hx    Uterine cancer Neg Hx    Colon polyps Neg Hx    Esophageal cancer Neg Hx    Stomach cancer Neg Hx    Rectal cancer Neg Hx     Social History   Socioeconomic History   Marital status: Married    Spouse name: Not on file   Number of children: 2   Years of education: Not on file   Highest education level: 12th grade  Occupational History   Occupation: Estate manager/land agent   Occupation: stocking  Tobacco Use   Smoking status: Never   Smokeless tobacco: Never  Vaping Use   Vaping status: Never Used  Substance and Sexual Activity   Alcohol use: Yes    Comment: occasional   Drug use: No   Sexual activity: Not Currently  Other Topics Concern   Not on file  Social History Narrative   Not on file   Social Drivers of Health   Financial Resource Strain: Not on file  Food Insecurity: No Food Insecurity (10/03/2022)   Hunger Vital Sign    Worried About Running Out of Food in the Last Year: Never true    Ran Out of Food in the Last Year: Never true  Transportation Needs: No Transportation Needs (10/03/2022)  PRAPARE - Administrator, Civil Service (Medical): No    Lack of Transportation (Non-Medical): No  Physical Activity: Insufficiently Active (10/03/2022)   Exercise Vital Sign    Days of Exercise per Week: 7 days    Minutes of Exercise per Session: 20 min  Stress: No Stress Concern Present (10/03/2022)   [employer] of Occupational Health - Occupational Stress Questionnaire    Feeling of Stress : Not at all  Social Connections: Socially Isolated (10/03/2022)   Social Connection and Isolation Panel [NHANES]    Frequency of Communication with Friends and Family: Never    Frequency of Social Gatherings with Friends and Family: Once a week     Attends Religious Services: Never    Database administrator or Organizations: No    Attends Engineer, structural: Not on file    Marital Status: Married    Current Medications:  Current Outpatient Medications:    acetaminophen (TYLENOL) 650 MG CR tablet, Take 650 mg by mouth every 8 (eight) hours as needed for pain., Disp: , Rfl:    ammonium lactate (LAC-HYDRIN) 12 % lotion, Apply 1 Application topically as needed., Disp: , Rfl:    Ascorbic Acid (VITAMIN C) 1000 MG tablet, Take 1,000 mg by mouth daily., Disp: , Rfl:    calcium carbonate (TUMS - DOSED IN MG ELEMENTAL CALCIUM) 500 MG chewable tablet, Chew 1-2 tablets by mouth daily as needed for indigestion or heartburn., Disp: , Rfl:    cholecalciferol (VITAMIN D3) 25 MCG (1000 UNIT) tablet, Take 2,000 Units by mouth daily., Disp: , Rfl:    hydrochlorothiazide (HYDRODIURIL) 25 MG tablet, TAKE 1 TABLET BY MOUTH EVERY DAY AS NEEDED, Disp: 90 tablet, Rfl: 3   ibuprofen (ADVIL) 400 MG tablet, Take 400 mg by mouth every 6 (six) hours as needed for moderate pain., Disp: , Rfl:    KLOR-CON M20 20 MEQ tablet, TAKE 1 TABLET BY MOUTH EVERY DAY, Disp: 90 tablet, Rfl: 1   Menthol-Methyl Salicylate (SALONPAS PAIN RELIEF PATCH EX), Apply 1 patch topically daily as needed (pain)., Disp: , Rfl:    Multiple Vitamins-Minerals (MULTIVITAMIN WITH MINERALS) tablet, Take 1 tablet by mouth daily., Disp: , Rfl:    Pyridoxine HCl (VITAMIN B6) 100 MG TABS, Take 100 mg by mouth daily., Disp: , Rfl:   Review of Systems: + cough, problem with walking Denies appetite changes, fevers, chills, fatigue, unexplained weight changes. Denies hearing loss, neck lumps or masses, mouth sores, ringing in ears or voice changes. Denies wheezing.  Denies shortness of breath. Denies chest pain or palpitations. Denies leg swelling. Denies abdominal distention, pain, blood in stools, constipation, diarrhea, nausea, vomiting, or early satiety. Denies pain with intercourse,  dysuria, frequency, hematuria or incontinence. Denies hot flashes, pelvic pain, vaginal bleeding or vaginal discharge.   Denies joint pain, back pain or muscle pain/cramps. Denies itching, rash, or wounds. Denies dizziness, headaches, numbness or seizures. Denies swollen lymph nodes or glands, denies easy bruising or bleeding. Denies anxiety, depression, confusion, or decreased concentration.  Physical Exam: BP 138/70 (BP Location: Left Arm, Patient Position: Sitting)   Pulse 78   Temp 98.2 F (36.8 C) (Oral)   Resp 19   Wt 172 lb 3.2 oz (78.1 kg)   SpO2 97%   BMI 31.50 kg/m  General: Alert, oriented, no acute distress. HEENT: Normocephalic, atraumatic, sclera anicteric. Chest: Clear to auscultation bilaterally.  No wheezes or rhonchi. Cardiovascular: Regular rate and rhythm, no murmurs. Abdomen: soft, nontender.  Normoactive bowel  sounds.  No masses or hepatosplenomegaly appreciated.   Extremities: Grossly normal range of motion.  Warm, well perfused.  No edema bilaterally. Skin: No rashes or lesions noted. Lymphatics: No cervical, supraclavicular, or inguinal adenopathy. GU: External female genitalia notable for changes from prior surgery along the right vulva.  There is mild discoloration along the most inferior aspect of the incision, stable.   No other areas of discoloration, lesions, or atypical vasculature.   No new lesions.  Moderate atrophy of the vulva, loss of posterior labia bilaterally.  On vaginal exam, cervix normal appearing.  No vaginal lesions noted.   Laboratory & Radiologic Studies: CT A/P on 11/19:  1. No evidence of metastatic disease in the abdomen or pelvis. 2.  Aortic Atherosclerosis (ICD10-I70.0).  Assessment & Plan: EBONEE STOBER is a 68 y.o. woman with recurrent vulvar melanoma, treated with excision of inguinal lymph node (09/2022), completed nivolumab (08/2023).  Imaging in 12/2022 with enlarged right inguinal lymph node, resolved as of 03/2023  imaging.  Last imaging in 07/2023 was negative for recurrent disease.   The patient is overall doing well, tolerating immunotherapy better than she did after her initial diagnosis when she was on pembrolizumab.  She completed 12 cycles of nivolumab at the end of 2024.  Plan for repeat imaging next month.   No findings on the vulva concerning for disease recurrence.  Discussed her cough.  Offered that we could get a chest x-ray today.  I do not appreciate any wheezing or other abnormalities on pulmonary exam.  She feels that this is an upper airway cough that has responded some to treatment for allergies. Will continue to follow with her PCP.   I will plan to see the patient back for 63-month follow-up and exam.  20 minutes of total time was spent for this patient encounter, including preparation, face-to-face counseling with the patient and coordination of care, and documentation of the encounter.  Eugene Garnet, MD  Division of Gynecologic Oncology  Department of Obstetrics and Gynecology  [facility]

## 2023-11-17 ENCOUNTER — Other Ambulatory Visit: Payer: Self-pay

## 2023-11-30 ENCOUNTER — Ambulatory Visit (HOSPITAL_COMMUNITY)
Admission: RE | Admit: 2023-11-30 | Discharge: 2023-11-30 | Disposition: A | Discharge status: Home / Self Care | Payer: Medicare HMO | Source: Ambulatory Visit | Attending: Gynecologic Oncology | Admitting: Gynecologic Oncology

## 2023-11-30 ENCOUNTER — Encounter (HOSPITAL_COMMUNITY): Payer: Self-pay

## 2023-11-30 DIAGNOSIS — C519 Malignant neoplasm of vulva, unspecified: Secondary | ICD-10-CM | POA: Diagnosis present

## 2023-11-30 MED ORDER — IOHEXOL 300 MG/ML  SOLN
100.0000 mL | Freq: Once | INTRAMUSCULAR | Status: AC | PRN
  Administered 2023-11-30: 100 mL via INTRAVENOUS

## 2023-11-30 MED ORDER — IOHEXOL 300 MG/ML  SOLN
30.0000 mL | Freq: Once | INTRAMUSCULAR | Status: AC | PRN
  Administered 2023-11-30: 30 mL via ORAL

## 2023-12-05 ENCOUNTER — Encounter: Payer: Self-pay | Admitting: Gynecologic Oncology

## 2024-01-18 ENCOUNTER — Other Ambulatory Visit: Payer: Self-pay

## 2024-02-11 ENCOUNTER — Other Ambulatory Visit: Payer: Self-pay | Admitting: Gynecologic Oncology

## 2024-02-11 DIAGNOSIS — C519 Malignant neoplasm of vulva, unspecified: Secondary | ICD-10-CM

## 2024-02-12 ENCOUNTER — Ambulatory Visit (HOSPITAL_COMMUNITY)
Admission: RE | Admit: 2024-02-12 | Discharge: 2024-02-12 | Disposition: A | Discharge status: Home / Self Care | Payer: Medicare HMO | Source: Ambulatory Visit | Attending: Gynecologic Oncology | Admitting: Gynecologic Oncology

## 2024-02-12 DIAGNOSIS — C519 Malignant neoplasm of vulva, unspecified: Secondary | ICD-10-CM | POA: Insufficient documentation

## 2024-02-21 ENCOUNTER — Encounter: Payer: Self-pay | Admitting: Gynecologic Oncology

## 2024-02-21 ENCOUNTER — Inpatient Hospital Stay: Payer: Medicare HMO | Attending: Gynecologic Oncology | Admitting: Gynecologic Oncology

## 2024-02-21 VITALS — BP 123/64 | HR 72 | Temp 98.3°F | Resp 16 | Ht 62.0 in | Wt 173.8 lb

## 2024-02-21 DIAGNOSIS — M353 Polymyalgia rheumatica: Secondary | ICD-10-CM | POA: Diagnosis not present

## 2024-02-21 DIAGNOSIS — Z9221 Personal history of antineoplastic chemotherapy: Secondary | ICD-10-CM | POA: Diagnosis not present

## 2024-02-21 DIAGNOSIS — Z8544 Personal history of malignant neoplasm of other female genital organs: Secondary | ICD-10-CM | POA: Diagnosis present

## 2024-02-21 DIAGNOSIS — Z9079 Acquired absence of other genital organ(s): Secondary | ICD-10-CM | POA: Diagnosis not present

## 2024-02-21 DIAGNOSIS — C519 Malignant neoplasm of vulva, unspecified: Secondary | ICD-10-CM

## 2024-02-21 NOTE — Progress Notes (Signed)
 Gynecologic Oncology Return Clinic Visit  02/21/24  Reason for Visit: follow-up  Treatment History: Oncology History Overview Note  BRAF: undetectable (negative)   Melanoma of vulva (HCC)  10/26/2020 Initial Diagnosis   She was noted to have bloody discharge in her underwear.  She denies pain   12/07/2020 Initial Biopsy   FINAL MICROSCOPIC DIAGNOSIS:   A.   RIGHT VULVA, BIOPSY: PRIMARY MALIGNANT MELANOMA OF VULVA, MELANOMA  TABLE BELOW  MALIGNANT MELANOMA (AJCC 8TH EDITION):  PROCEDURE: BIOPSY  SPECIMEN ANATOMIC SITE: RIGHT VULVA  BRESLOW'S DEPTH/MAXIMUM TUMOR THICKNESS: 5.05 MM  CLARK/ANATOMIC LEVEL* IV  MARGINS:  PERIPHERAL: INVOLVED  DEEP: FREE  ULCERATION: PRESENT  MITOTIC INDEX: 11/mm2  LYMPHO-VASCULAR INVASION: PRESENT*  NEUTROTROPISM: ABSENT  TUMOR-INFILTRATING LYMPHOCYTES: NON-BRISK  LYMPH NODES: N/A  PATHOLOGIC STAGE: PT4B NX MX  *VULVAR MELANOMAS MAY NOT HAVE THE SAME BEHAVIOR STAGE FOR STAGE AS SUPERFICIAL SPREADING CUTANEOUS MELANOMA, AND LANDMARKS SUCH AS BRESLOW'S DEPTH MAY NOT APPLY. NONETHELESS, THIS SHOULD BE TREATED AS A PT4B MELANOMA AND COMPLETE EXCISION, WITH POSSIBLE LYMPH NODE EVALUATION, UNDERTAKEN. SEE COMMENT FOR IHC PROFILE.    COMMENT:  Sections show confluent and nested growth of atypical melanocytes along vulvar epithelium in an in-situ melanoma growth pattern at the periphery, with a central large ulcerated nodule of similar atypical  melanocytes, consonant with primary vulvar melanoma. Notably, IHC was performed to evaluate the lineage and is diagnostic for melanoma. Lesional melanocytes are positive with S100, MelanA, and show a high ki-67 uptake, and are negative with CDX-2, synaptophysin, CK20, TTF-1, CK7, CD56, and lymphoid markers CD5, CD3, CD20 highlight only background lymphocytes.    12/17/2020 Initial Diagnosis   Melanoma of vulva (HCC)   12/17/2020 Cancer Staging   Staging form: Melanoma of the Skin, AJCC 8th Edition - Clinical stage  from 12/17/2020: Stage III (rcT4b, cN2, cM0) - Signed by Almeda Jacobs, MD on 10/13/2022 Stage prefix: Recurrence   12/30/2020 PET scan   1. The only potential hypermetabolic finding of note is a small flat focus of accentuated metabolic activity along the anterior vaginal vestibule/perineum, maximum SUV 6.5. Most common cause for this type of appearance would be a trace amount of incontinence of urinary FDG, but given the proximity to the reported vulvar melanoma, this small focus is considered indeterminate. 2. Other imaging findings of potential clinical significance: Aortic Atherosclerosis (ICD10-I70.0). Small type 1 hiatal hernia. Palatine tonsilloliths. Colonic diverticulosis.   01/18/2021 Surgery   Preoperative Diagnosis: Vulvar melanoma  Postoperative Diagnosis: Same  Procedures performed: Wide radical vulvectomy, R inguinofemoral sentinel LN biopsy  Attending Surgeon: Wiley Hanger, MD  Fellow: Murray Arnold  Resident: Orland Bishop, MD; Katelyn Rittenhouse, MD  Anesthesia: General endotracheal  Findings:  1. Vulva with biopsy site changes at R labia minora adjacent to the clitoris. No visible tumor. 2. Inguinofemoral nodes not clinically enlarged or abnormal.   Specimens:  1. R inguinal sentinel LN, hot and blue 2. Portion of vulva 3. Superior deep margin    01/18/2021 Pathology Results   Only melanoma in situ seen in final resection, SLN negative   02/08/2021 - 03/22/2021 Chemotherapy         03/29/2021 Genetic Testing   Negative genetic testing on the Multi-cancer +RNA panel.  The report date is March 29, 2021.  The Multi-Gene Panel offered by [facility] includes sequencing and/or deletion duplication testing of the following 84 genes: AIP, ALK, APC, ATM, AXIN2,BAP1,  BARD1, BLM, BMPR1A, BRCA1, BRCA2, BRIP1, CASR, CDC73, CDH1, CDK4, CDKN1B, CDKN1C, CDKN2A (p14ARF), CDKN2A (p16INK4a), CEBPA,  CHEK2, CTNNA1, DICER1, DIS3L2, EGFR (c.2369C>T, p.Thr790Met variant only), EPCAM  (Deletion/duplication testing only), FH, FLCN, GATA2, GPC3, GREM1 (Promoter region deletion/duplication testing only), HOXB13 (c.251G>A, p.Gly84Glu), HRAS, KIT, MAX, MEN1, MET, MITF (c.952G>A, p.Glu318Lys variant only), MLH1, MSH2, MSH3, MSH6, MUTYH, NBN, NF1, NF2, NTHL1, PALB2, PDGFRA, PHOX2B, PMS2, POLD1, POLE, POT1, PRKAR1A, PTCH1, PTEN, RAD50, RAD51C, RAD51D, RB1, RECQL4, RET, RUNX1, SDHAF2, SDHA (sequence changes only), SDHB, SDHC, SDHD, SMAD4, SMARCA4, SMARCB1, SMARCE1, STK11, SUFU, TERC, TERT, TMEM127, TP53, TSC1, TSC2, VHL, WRN and WT1.     04/29/2021 PET scan   1. No vulvar mass or residual hypermetabolism. 2. No findings suspicious for metastatic disease.   11/28/2021 Pathology Results   Vulvar biopsy - fibrosis c/w scar. Small junctional nevus. No melanoma identified.   08/31/2022 PET scan   1. Enlarging RIGHT inguinal lymph node suspicious for metastatic disease. This shows only mild to moderate increased metabolic activity but displays FDG uptake greater than blood pool and shows abnormal morphology. Tissue sampling may be helpful. 2. No additional signs of disease in the neck, chest, abdomen or pelvis. 3. Signs of pelvic floor dysfunction and probable cystocele. 4. Chronic fracture of RIGHT inferior pubic ramus signs of healing since previous imaging. 5. Aortic atherosclerosis. 6. Moderate to large hiatal hernia.   Aortic Atherosclerosis (ICD10-I70.0).   09/04/2022 Pathology Results   FINAL MICROSCOPIC DIAGNOSIS:   A. LYMPH NODE, RIGHT INGUINAL, BIOPSY:  Metastatic melanoma.  See comment.   COMMENT:  The tumor cells are loosely cohesive with foci of cytoplasmic pigment. Immunohistochemistry is positive with Melan-A, S100 and SOX10.  The morphology and immunophenotype are consistent with metastatic melanoma.      09/27/2022 Surgery   Surgery: Excision of enlarged right inguinal lymph node   Operative findings: 2 cm palpable but mobile right inguinal lymph node, hyperpigmented.  No other palpable adenopathy.   11/13/2022 -  Chemotherapy   Patient is on Treatment Plan : MELANOMA Nivolumab  (480) q28d     01/03/2023 Imaging   1. Enlarged right inguinal lymph node measuring 1.1 cm in short axis, similar to prior PET-CT. 2. No other potential sites of metastatic disease noted elsewhere in the chest, abdomen or pelvis. 3. Moderate-sized hiatal hernia. 4. Colonic diverticulosis without evidence of acute diverticulitis at this time. 5. Aortic atherosclerosis. 6. Additional incidental findings, as above.     03/26/2023 Imaging   CT ABDOMEN PELVIS W CONTRAST  Result Date: 03/27/2023 CLINICAL DATA:  Melanoma, assess treatment response * Tracking Code: BO * EXAM: CT ABDOMEN AND PELVIS WITH CONTRAST TECHNIQUE: Multidetector CT imaging of the abdomen and pelvis was performed using the standard protocol following bolus administration of intravenous contrast. RADIATION DOSE REDUCTION: This exam was performed according to the departmental dose-optimization program which includes automated exposure control, adjustment of the mA and/or kV according to patient size and/or use of iterative reconstruction technique. CONTRAST:  OMNIPAQUE  IOHEXOL  300 MG/ML  SOLN COMPARISON:  CT scan chest, abdomen and pelvis from 01/01/2023. FINDINGS: Lower chest: There are subpleural atelectatic changes in the visualized lung bases. No overt consolidation. No pleural effusion. The heart is normal in size. No pericardial effusion. 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 Non-cirrhotic configuration. No suspicious mass. Note is made of a lobulated 2.4 x 4.2 cm cyst in the left hepatic lobe, stable since the prior study. There is a stable subcentimeter hypoattenuating focus in the left hepatic lobe, segment 4B, which is too small to adequately characterize but appears unchanged since  the prior study and favored benign as well. No intrahepatic or  extrahepatic bile duct dilation. No calcified gallstones. Normal gallbladder wall thickness. No pericholecystic inflammatory changes. Pancreas: Unremarkable. No pancreatic ductal dilatation or surrounding inflammatory changes. Spleen: Within normal limits. No focal lesion. Adrenals/Urinary Tract: Adrenal glands are unremarkable. No suspicious renal mass. Redemonstration of several sinus cysts in the left kidney. No hydronephrosis. No renal or ureteric calculi. Urinary bladder is under distended, precluding optimal assessment. However, no large mass or stones identified. No perivesical fat stranding. Stomach/Bowel: No disproportionate dilation of the small or large bowel loops. No evidence of abnormal bowel wall thickening or inflammatory changes. The appendix is unremarkable. There are multiple diverticula throughout the colon, without imaging signs of diverticulitis. There is a small-to-moderate hiatal hernia. Vascular/Lymphatic: No ascites or pneumoperitoneum. No abdominal or pelvic lymphadenopathy, by size criteria. Previously noted right inguinal lymph node (series 2, image 110 of study from 01/01/2023) adjacent to the deepest surgical staple is no longer seen on this exam. There was also another smaller lymph node (series 2, image 112 of study dated 01/01/2023), which is also not seen on today's exam. No aneurysmal dilation of the major abdominal arteries. Reproductive: The uterus is unremarkable. No large adnexal mass. Other: There is a tiny fat containing umbilical hernia. There are metallic staples and postsurgical changes in the right inguinal region. Musculoskeletal: No suspicious osseous lesions. There are mild multilevel degenerative changes in the visualized spine. IMPRESSION: 1. No evidence of metastatic melanoma in the abdomen or pelvis. Previously noted right inguinal lymph nodes are no longer seen on today's exam. 2. Multiple other nonacute observations, as described above. Electronically Signed    By: Beula Brunswick M.D.   On: 03/27/2023 15:11      08/14/2023 Imaging   CT ABDOMEN PELVIS W CONTRAST  Result Date: 08/17/2023 CLINICAL DATA:  History of vulvar melanoma diagnosed in 2022 with prior recurrence in right inguinal lymph node, on immunotherapy. * Tracking Code: BO * EXAM: CT ABDOMEN AND PELVIS WITH CONTRAST TECHNIQUE: Multidetector CT imaging of the abdomen and pelvis was performed using the standard protocol following bolus administration of intravenous contrast. RADIATION DOSE REDUCTION: This exam was performed according to the departmental dose-optimization program which includes automated exposure control, adjustment of the mA and/or kV according to patient size and/or use of iterative reconstruction technique. CONTRAST:  OMNIPAQUE  IOHEXOL  300 MG/ML  SOLN COMPARISON:  CT abdomen and pelvis dated 03/26/2023 FINDINGS: Lower chest: No focal consolidation or pulmonary nodule in the lung bases. No pleural effusion or pneumothorax demonstrated. Partially imaged heart size is normal. Hepatobiliary: Similar segment 2 cyst. Unchanged segment 4 subcentimeter hypodensity (2:27), too small to characterize but also likely cysts. No intra or extrahepatic biliary ductal dilation. Normal gallbladder. Pancreas: No focal lesions or main ductal dilation. Spleen: Normal in size without focal abnormality. Adrenals/Urinary Tract: No adrenal nodules. No suspicious renal mass, calculi or hydronephrosis. Simple left parapelvic cysts. No specific follow-up imaging recommended. No focal bladder wall thickening. Stomach/Bowel: Small hiatal hernia. Normal appearance of the stomach. No evidence of bowel wall thickening, distention, or inflammatory changes. Colonic diverticulosis without acute diverticulitis. Normal appendix. Vascular/Lymphatic: Aortic atherosclerosis. No enlarged abdominal or pelvic lymph nodes. Reproductive: No adnexal masses. Other: No free fluid, fluid collection, or free air. Musculoskeletal: No  acute or abnormal lytic or blastic osseous lesions. Multilevel degenerative changes of the partially imaged thoracic and lumbar spine. Postsurgical changes of the right inguinal region. IMPRESSION: 1. No evidence of metastatic disease in the abdomen  or pelvis. 2.  Aortic Atherosclerosis (ICD10-I70.0). Electronically Signed   By: Limin  Xu M.D.   On: 08/17/2023 14:16        Interval History: Reports overall doing well.  No change in terms of symptoms since nivolumab  was stopped.  In the setting of her polymyalgia rheumatica, describes discomfort as more being in the "soft tissue" and not the joint.  Affects her legs, feels very stiff when she goes from sitting to standing even if she has not been sitting for very long. Denies any vulvar symptoms including pruritus, pain, discharge, or bleeding.  Past Medical/Surgical History: 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   melanoma   Complication of anesthesia    hard to wake up after anesthesia   Family history of melanoma    History of kidney stones    Melanotic macule of vulvar labia    Migraine    Polymyalgia rheumatica (HCC)    Venous insufficiency     Past Surgical History:  Procedure Laterality Date   Cataract Right 05/22/2022   CATARACT EXTRACTION Left 06/05/2022   COLONOSCOPY     INGUINAL LYMPHADENECTOMY Right 09/27/2022   Procedure: RIGHT INGUINAL LYMPH NODE DEBULKING;  Surgeon: Suzi Essex, MD;  Location: [facility];  Service: Gynecology;  Laterality: Right;   VULVECTOMY  01/19/2021   done at [facility], had sentinel node biopsy also   WRIST SURGERY  05/15/2023   Left    Family History  Problem Relation Age of Onset   Diabetes Mother    AVM Father    Melanoma Father        multiple melanomas dx between 8-90   Cancer Father 81       tumor on kidney   Cancer Paternal Grandfather        laryngeal cancer    Cancer Daughter 39       appendiceal cancer   Breast cancer Neg Hx    Ovarian cancer Neg Hx    Colon cancer Neg Hx    Uterine cancer Neg Hx    Colon polyps Neg Hx    Esophageal cancer Neg Hx    Stomach cancer Neg Hx    Rectal cancer Neg Hx     Social History   Socioeconomic History   Marital status: Married    Spouse name: Not on file   Number of children: 2   Years of education: Not on file   Highest education level: 12th grade  Occupational History   Occupation: Estate manager/land agent   Occupation: stocking  Tobacco Use   Smoking status: Never   Smokeless tobacco: Never  Vaping Use   Vaping status: Never Used  Substance and Sexual Activity   Alcohol use: Yes    Comment: occasional   Drug use: No   Sexual activity: Not Currently  Other Topics Concern   Not on file  Social History Narrative   Not on file   Social Drivers of Health   Financial Resource Strain: Not on file  Food Insecurity: No Food Insecurity (10/03/2022)   Hunger Vital Sign    Worried About Running Out of Food in the Last Year: Never true    Ran Out of Food in the Last Year: Never true  Transportation Needs: No Transportation Needs (10/03/2022)   PRAPARE - Administrator, Civil Service (Medical): No  Lack of Transportation (Non-Medical): No  Physical Activity: Insufficiently Active (10/03/2022)   Exercise Vital Sign    Days of Exercise per Week: 7 days    Minutes of Exercise per Session: 20 min  Stress: No Stress Concern Present (10/03/2022)   [employer] of Occupational Health - Occupational Stress Questionnaire    Feeling of Stress : Not at all  Social Connections: Socially Isolated (10/03/2022)   Social Connection and Isolation Panel [NHANES]    Frequency of Communication with Friends and Family: Never    Frequency of Social Gatherings with Friends and Family: Once a week    Attends Religious Services: Never    Database administrator or Organizations: No    Attends Hospital doctor: Not on file    Marital Status: Married    Current Medications:  Current Outpatient Medications:    acetaminophen  (TYLENOL ) 650 MG CR tablet, Take 650 mg by mouth every 8 (eight) hours as needed for pain., Disp: , Rfl:    ammonium lactate (LAC-HYDRIN) 12 % lotion, Apply 1 Application topically as needed., Disp: , Rfl:    Ascorbic Acid (VITAMIN C) 1000 MG tablet, Take 1,000 mg by mouth daily., Disp: , Rfl:    calcium carbonate (TUMS - DOSED IN MG ELEMENTAL CALCIUM) 500 MG chewable tablet, Chew 1-2 tablets by mouth daily as needed for indigestion or heartburn., Disp: , Rfl:    cholecalciferol (VITAMIN D3) 25 MCG (1000 UNIT) tablet, Take 2,000 Units by mouth daily., Disp: , Rfl:    hydrochlorothiazide  (HYDRODIURIL ) 25 MG tablet, TAKE 1 TABLET BY MOUTH EVERY DAY AS NEEDED, Disp: 90 tablet, Rfl: 3   ibuprofen  (ADVIL ) 400 MG tablet, Take 400 mg by mouth every 6 (six) hours as needed for moderate pain., Disp: , Rfl:    KLOR-CON  M20 20 MEQ tablet, TAKE 1 TABLET BY MOUTH EVERY DAY, Disp: 90 tablet, Rfl: 1   Menthol-Methyl Salicylate (SALONPAS PAIN RELIEF PATCH EX), Apply 1 patch topically daily as needed (pain)., Disp: , Rfl:    Multiple Vitamins-Minerals (MULTIVITAMIN WITH MINERALS) tablet, Take 1 tablet by mouth daily., Disp: , Rfl:    Pyridoxine HCl (VITAMIN B6) 100 MG TABS, Take 100 mg by mouth daily., Disp: , Rfl:   Review of Systems: Denies appetite changes, fevers, chills, fatigue, unexplained weight changes. Denies hearing loss, neck lumps or masses, mouth sores, ringing in ears or voice changes. Denies cough or wheezing.  Denies shortness of breath. Denies chest pain or palpitations. Denies leg swelling. Denies abdominal distention, pain, blood in stools, constipation, diarrhea, nausea, vomiting, or early satiety. Denies pain with intercourse, dysuria, frequency, hematuria or incontinence. Denies hot flashes, pelvic pain, vaginal bleeding or vaginal discharge.   Denies joint pain,  back pain or muscle pain/cramps. Denies itching, rash, or wounds. Denies dizziness, headaches, numbness or seizures. Denies swollen lymph nodes or glands, denies easy bruising or bleeding. Denies anxiety, depression, confusion, or decreased concentration.  Physical Exam: BP 123/64 (BP Location: Left Arm, Patient Position: Sitting)   Pulse 72   Temp 98.3 F (36.8 C) (Oral)   Resp 16   Ht 5\' 2"  (1.575 m)   Wt 173 lb 12.8 oz (78.8 kg)   SpO2 97%   BMI 31.79 kg/m  General: Alert, oriented, no acute distress. HEENT: Normocephalic, atraumatic, sclera anicteric. Chest: Clear to auscultation bilaterally.  No wheezes or rhonchi. Cardiovascular: Regular rate and rhythm, no murmurs. Abdomen: soft, nontender.  Normoactive bowel sounds.  No masses or hepatosplenomegaly appreciated.   Extremities:  Grossly normal range of motion.  Warm, well perfused.  No edema bilaterally. Skin: No rashes or lesions noted. Lymphatics: No cervical, supraclavicular, or inguinal adenopathy. GU: External female genitalia notable for changes from prior surgery along the right vulva.  There is mild discoloration along the most inferior aspect of the incision, stable.   No other areas of discoloration, lesions, or atypical vasculature.   No new lesions.  Moderate atrophy of the vulva, loss of posterior labia bilaterally.  On speculum exam, cervix normal appearing.  No vaginal lesions noted.  Laboratory & Radiologic Studies: CT C/A/P on 11/30/23: IMPRESSION: 1. No evidence of metastatic disease within the chest, abdomen or pelvis. 2. Moderate size hiatal hernia. 3. Colonic diverticulosis without findings of acute diverticulitis. 4. Aortic atherosclerosis.  Assessment & Plan: Ariel Rios is a 68 y.o. woman with recurrent vulvar melanoma, treated with excision of inguinal lymph node (09/2022), completed nivolumab  (08/2023).  Imaging in 12/2022 with enlarged right inguinal lymph node, resolved as of 03/2023 imaging.   Last imaging in 11/2023 was negative for recurrent disease. Recent groin ultrasound shows several borderline lymph nodes that are normal in architecture.   The patient is overall doing well, tolerating immunotherapy better than she did after her initial diagnosis when she was on pembrolizumab .  She completed 12 cycles of nivolumab  at the end of 2024.    Given issues with pain, especially affecting her lower extremities and her quality of life, I encouraged her to reach out to her primary care provider about other options for treatment of the symptoms.   No findings on the vulva concerning for disease recurrence.  We discussed recent bilateral groin ultrasound.  I spoke with one of the radiologist today who looked back at her recent ultrasound as well as her CT scan.  Lymph nodes themselves are normal in appearance with fatty architecture.  Their appearance would not suggest metastatic disease.  Recommendation was to proceed with CT scan that we have scheduled in August to be able to compare from March CT scan.  If any findings concerning in terms of size of lymph nodes or their appearance on her upcoming CT scan, would plan on PET scan.   I will see the patient back for 56-month follow-up and exam.  22 minutes of total time was spent for this patient encounter, including preparation, face-to-face counseling with the patient and coordination of care, and documentation of the encounter.  Wiley Hanger, MD  Division of Gynecologic Oncology  Department of Obstetrics and Gynecology  [facility]

## 2024-02-21 NOTE — Patient Instructions (Signed)
 It was good to see you today.  I do not see or feel any evidence of cancer recurrence on your exam.  I will see you for follow-up in 3 months.  As always, if you develop any new and concerning symptoms before your next visit, please call to see me sooner.

## 2024-02-27 ENCOUNTER — Other Ambulatory Visit: Payer: Self-pay | Admitting: Family Medicine

## 2024-02-27 DIAGNOSIS — E876 Hypokalemia: Secondary | ICD-10-CM

## 2024-04-18 ENCOUNTER — Ambulatory Visit: Admitting: Family Medicine

## 2024-04-18 ENCOUNTER — Encounter: Payer: Self-pay | Admitting: Family Medicine

## 2024-04-18 ENCOUNTER — Ambulatory Visit: Payer: Self-pay | Admitting: Family Medicine

## 2024-04-18 VITALS — BP 110/70 | HR 75 | Temp 98.3°F | Resp 16 | Ht 62.0 in | Wt 173.2 lb

## 2024-04-18 DIAGNOSIS — E785 Hyperlipidemia, unspecified: Secondary | ICD-10-CM

## 2024-04-18 DIAGNOSIS — R7303 Prediabetes: Secondary | ICD-10-CM

## 2024-04-18 DIAGNOSIS — Z0189 Encounter for other specified special examinations: Secondary | ICD-10-CM

## 2024-04-18 DIAGNOSIS — Z0001 Encounter for general adult medical examination with abnormal findings: Secondary | ICD-10-CM | POA: Diagnosis not present

## 2024-04-18 DIAGNOSIS — E559 Vitamin D deficiency, unspecified: Secondary | ICD-10-CM | POA: Diagnosis not present

## 2024-04-18 DIAGNOSIS — C774 Secondary and unspecified malignant neoplasm of inguinal and lower limb lymph nodes: Secondary | ICD-10-CM | POA: Diagnosis not present

## 2024-04-18 DIAGNOSIS — M353 Polymyalgia rheumatica: Secondary | ICD-10-CM

## 2024-04-18 DIAGNOSIS — M2550 Pain in unspecified joint: Secondary | ICD-10-CM

## 2024-04-18 DIAGNOSIS — E876 Hypokalemia: Secondary | ICD-10-CM

## 2024-04-18 DIAGNOSIS — Z0000 Encounter for general adult medical examination without abnormal findings: Secondary | ICD-10-CM

## 2024-04-18 DIAGNOSIS — R600 Localized edema: Secondary | ICD-10-CM | POA: Diagnosis not present

## 2024-04-18 LAB — BASIC METABOLIC PANEL WITH GFR
BUN: 16 mg/dL (ref 6–23)
CO2: 30 meq/L (ref 19–32)
Calcium: 9.5 mg/dL (ref 8.4–10.5)
Chloride: 101 meq/L (ref 96–112)
Creatinine, Ser: 0.41 mg/dL (ref 0.40–1.20)
GFR: 102.39 mL/min (ref >60.00)
Glucose, Bld: 83 mg/dL (ref 70–99)
Potassium: 3.4 meq/L — ABNORMAL LOW (ref 3.5–5.1)
Sodium: 140 meq/L (ref 135–145)

## 2024-04-18 LAB — LIPID PANEL
Cholesterol: 203 mg/dL — ABNORMAL HIGH (ref 0–200)
HDL: 61.4 mg/dL (ref >39.00)
LDL Cholesterol: 126 mg/dL — ABNORMAL HIGH (ref 0–99)
NonHDL: 141.95
Total CHOL/HDL Ratio: 3
Triglycerides: 78 mg/dL (ref 0.0–149.0)
VLDL: 15.6 mg/dL (ref 0.0–40.0)

## 2024-04-18 LAB — CBC
HCT: 35.7 % — ABNORMAL LOW (ref 36.0–46.0)
Hemoglobin: 12 g/dL (ref 12.0–15.0)
MCHC: 33.6 g/dL (ref 30.0–36.0)
MCV: 88.2 fl (ref 78.0–100.0)
Platelets: 300 K/uL (ref 150.0–400.0)
RBC: 4.05 Mil/uL (ref 3.87–5.11)
RDW: 13.8 % (ref 11.5–15.5)
WBC: 3.9 K/uL — ABNORMAL LOW (ref 4.0–10.5)

## 2024-04-18 LAB — HEMOGLOBIN A1C: Hgb A1c MFr Bld: 5.8 % (ref 4.6–6.5)

## 2024-04-18 LAB — C-REACTIVE PROTEIN: CRP: <1 mg/dL (ref 0.5–20.0)

## 2024-04-18 LAB — SEDIMENTATION RATE: Sed Rate: 24 mm/h (ref 0–30)

## 2024-04-18 LAB — VITAMIN D 25 HYDROXY (VIT D DEFICIENCY, FRACTURES): VITD: 42.5 ng/mL (ref 30.00–100.00)

## 2024-04-18 MED ORDER — HYDROCHLOROTHIAZIDE 25 MG PO TABS: 25.0000 mg | ORAL_TABLET | Freq: Every day | ORAL | 2 refills | Status: AC | PRN

## 2024-04-18 NOTE — Progress Notes (Signed)
 HPI: [name] is a 68 y.o. female  with PMHx significant for vulvar melanoma, HLD, chronic right knee pain, polymyalgia,  LE edema, aortic atherosclerosis, among some, who is here today for her routine physical.  Last CPE: 12/12/2021  She retired her full time job in May/2025. Still working part time.   Exercise: walking the dog 20 ; exercise bike at least 30 minutes daily.  Diet: Mostly home-cooked meals. Eating vegetables daily. Sleep: 7 hours/night; sometimes  Smoking: Never Alcohol consumption: Occasional Dental: Has not followed up in a long time.  Vision: UTD with routine vision exams.   Health Maintenance  Topic Date Due   Medicare Annual Wellness Visit  04/15/2024   COVID-19 Vaccine (1) 05/04/2024*   Flu Shot  04/25/2024   DTaP/Tdap/Td vaccine (3 - Td or Tdap) 11/17/2024   Mammogram  12/11/2024   Colon Cancer Screening  12/29/2024   Pneumococcal Vaccine for age over 26  Completed   DEXA scan (bone density measurement)  Completed   Hepatitis C Screening  Completed   Zoster (Shingles) Vaccine  Completed   Hepatitis B Vaccine  Aged Out   HPV Vaccine  Aged Out   Meningitis B Vaccine  Aged Out  *Topic was postponed. The date shown is not the original due date.   Immunization History  Administered Date(s) Administered   Fluad Quad(high Dose 65+) 10/06/2022   Influenza Split 08/29/2011, 06/18/2012   Influenza,inj,Quad PF,6+ Mos 07/29/2013, 06/08/2014, 06/21/2015, 07/04/2016, 07/23/2018, 07/25/2019, 07/26/2020   Influenza-Unspecified 07/23/2018   PNEUMOCOCCAL CONJUGATE-20 10/06/2022   Pneumococcal Polysaccharide-23 11/23/2020   Td 09/11/2005   Tdap 11/17/2014   Zoster Recombinant(Shingrix ) 03/19/2018, 05/21/2018   Chronic medical problems:  Hyperlipidemia: Currently on nonpharmacologic treatment.  Component     Latest Ref Rng 04/16/2023  Cholesterol     0 - 200 mg/dL 811   HDL Cholesterol     >39.00 mg/dL 44.59   Triglycerides     0.0 - 149.0  mg/dL 858.9   Total CHOL/HDL Ratio 3   VLDL     0.0 - 40.0 mg/dL 71.7   LDL (calc)     0 - 99 mg/dL 894 (H)   NonHDL 866.96     Vulvar melanoma She is followed by Dr. Viktoria of gynecology oncology and was last seen on 02/21/24. She states she has completed immunotherapy in 08/2023.  Her next f/u is scheduled 05/23/24 and has an abd and pelvic CT on 06/11/2024.   PMR/arthralgias Pt was previously on Prednisone .  She continues to struggle with movements requiring her change sitting/standing positions (e.g. getting up from the toilet or getting into the car).  She notes she has not been able to get on her knees in a while, and has noticed similar limitations.  Has been taking Ibuprofen  daily for about 1 year, medication helps..  Was previously on Meloxicam  in the past, did not feel like it helped with pain.  Chronic R shoulder pain She has a times struggled with daily tasks such as brushing her hair.  She adds that she believes her shoulder is holding on by one tendon. When lifting her arm above her head, she often has to use left hand to support her right arm and avoid it slowly falling down.  Denies any pain or limitations in her left shoulder.   LE Edema Currently on HCTZ 25 mg once daily.  Good compliance and tolerance.  No acute concerns reported in this regard.   Hypokalemia: Currently she is on K-Lor  20 mK daily. 08/2023 K+ was 3.6.  She has been taking a multivitamin containing 2,000 IUs of vitamin D  daily.   Review of Systems  Constitutional:  Negative for activity change, appetite change and fever.  HENT:  Negative for mouth sores, sore throat and trouble swallowing.   Eyes:  Negative for redness and visual disturbance.  Respiratory:  Negative for cough, shortness of breath and wheezing.   Cardiovascular:  Negative for chest pain and leg swelling.  Gastrointestinal:  Negative for abdominal pain, nausea and vomiting.  Endocrine: Negative for cold intolerance, heat  intolerance, polydipsia, polyphagia and polyuria.  Genitourinary:  Negative for decreased urine volume, dysuria and hematuria.  Musculoskeletal:  Positive for arthralgias and myalgias. Negative for gait problem.  Skin:  Negative for color change and rash.  Allergic/Immunologic: Negative for environmental allergies.  Neurological:  Negative for seizures, syncope, weakness and headaches.  Hematological:  Negative for adenopathy. Does not bruise/bleed easily.  Psychiatric/Behavioral:  Negative for confusion. The patient is not nervous/anxious.   All other systems reviewed and are negative.  Current Outpatient Medications on File Prior to Visit  Medication Sig Dispense Refill   acetaminophen  (TYLENOL ) 650 MG CR tablet Take 650 mg by mouth every 8 (eight) hours as needed for pain.     calcium carbonate (TUMS - DOSED IN MG ELEMENTAL CALCIUM) 500 MG chewable tablet Chew 1-2 tablets by mouth daily as needed for indigestion or heartburn.     ibuprofen  (ADVIL ) 400 MG tablet Take 400 mg by mouth every 6 (six) hours as needed for moderate pain.     Menthol-Methyl Salicylate (SALONPAS PAIN RELIEF PATCH EX) Apply 1 patch topically daily as needed (pain).     Multiple Vitamins-Minerals (MULTIVITAMIN WOMEN 50+ PO) Take 1 tablet by mouth daily.     potassium chloride  SA (KLOR-CON  M) 20 MEQ tablet TAKE 1 TABLET BY MOUTH EVERY DAY 90 tablet 1   [DISCONTINUED] prochlorperazine  (COMPAZINE ) 10 MG tablet Take 1 tablet (10 mg total) by mouth every 6 (six) hours as needed for nausea or vomiting. (Patient not taking: Reported on 03/07/2021) 30 tablet 1   No current facility-administered medications on file prior to visit.   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   melanoma   Complication of anesthesia    hard to wake up after anesthesia   Family history of melanoma    History of kidney stones    Melanotic  macule of vulvar labia    Migraine    Polymyalgia rheumatica (HCC)    Venous insufficiency    Past Surgical History:  Procedure Laterality Date   Cataract Right 05/22/2022   CATARACT EXTRACTION Left 06/05/2022   COLONOSCOPY     EYE SURGERY     Cataracts   INGUINAL LYMPHADENECTOMY Right 09/27/2022   Procedure: RIGHT INGUINAL LYMPH NODE DEBULKING;  Surgeon: Ariel Rios Comer SAUNDERS, MD;  Location: [facility];  Service: Gynecology;  Laterality: Right;   VULVECTOMY  01/19/2021   done at [facility], had sentinel node biopsy also   WRIST SURGERY  05/15/2023   Left   No Known Allergies  Family History  Problem Relation Age of Onset   Diabetes Mother    Cancer Mother    AVM Father    Melanoma Father        multiple melanomas dx between 9-90   Cancer Father 63  tumor on kidney   Kidney disease Father    Cancer Paternal Grandfather        laryngeal cancer   Cancer Daughter 35       appendiceal cancer   Obesity Daughter    Breast cancer Neg Hx    Ovarian cancer Neg Hx    Colon cancer Neg Hx    Uterine cancer Neg Hx    Colon polyps Neg Hx    Esophageal cancer Neg Hx    Stomach cancer Neg Hx    Rectal cancer Neg Hx     Social History   Socioeconomic History   Marital status: Married    Spouse name: Not on file   Number of children: 2   Years of education: Not on file   Highest education level: 12th grade  Occupational History   Occupation: Estate manager/land agent   Occupation: stocking  Tobacco Use   Smoking status: Never   Smokeless tobacco: Never  Vaping Use   Vaping status: Never Used  Substance and Sexual Activity   Alcohol use: Yes    Comment: occasional   Drug use: No   Sexual activity: Not Currently    Birth control/protection: Post-menopausal  Other Topics Concern   Not on file  Social History Narrative   Not on file   Social Drivers of Health   Financial Resource Strain: Low Risk  (04/11/2024)   Overall Financial Resource Strain (CARDIA)    Difficulty of  Paying Living Expenses: Not very hard  Food Insecurity: No Food Insecurity (04/11/2024)   Hunger Vital Sign    Worried About Running Out of Food in the Last Year: Never true    Ran Out of Food in the Last Year: Never true  Transportation Needs: No Transportation Needs (04/11/2024)   PRAPARE - Administrator, Civil Service (Medical): No    Lack of Transportation (Non-Medical): No  Physical Activity: Insufficiently Active (04/11/2024)   Exercise Vital Sign    Days of Exercise per Week: 7 days    Minutes of Exercise per Session: 20 min  Stress: No Stress Concern Present (04/11/2024)   [employer] of Occupational Health - Occupational Stress Questionnaire    Feeling of Stress: Not at all  Social Connections: Socially Isolated (04/11/2024)   Social Connection and Isolation Panel    Frequency of Communication with Friends and Family: Once a week    Frequency of Social Gatherings with Friends and Family: Once a week    Attends Religious Services: Never    Database administrator or Organizations: No    Attends Banker Meetings: Not on file    Marital Status: Married   Vitals:   04/18/24 0653  BP: 110/70  Pulse: 75  Resp: 16  Temp: 98.3 F (36.8 C)  SpO2: 96%   Body mass index is 31.69 kg/m.  Wt Readings from Last 3 Encounters:  04/18/24 173 lb 4 oz (78.6 kg)  02/21/24 173 lb 12.8 oz (78.8 kg)  11/16/23 172 lb 3.2 oz (78.1 kg)   Physical Exam Vitals and nursing note reviewed.  Constitutional:      General: She is not in acute distress.    Appearance: She is well-developed.  HENT:     Head: Normocephalic and atraumatic.     Right Ear: External ear normal. Tympanic membrane is not erythematous.     Left Ear: External ear normal. Tympanic membrane is not erythematous.     Ears:     Comments:  Bilateral ear canal cerumen excess, TM's seen partially.    Mouth/Throat:     Mouth: Mucous membranes are moist.     Pharynx: Oropharynx is clear. Uvula  midline.  Eyes:     Extraocular Movements: Extraocular movements intact.     Conjunctiva/sclera: Conjunctivae normal.     Pupils: Pupils are equal, round, and reactive to light.  Neck:     Thyroid : No thyroid  mass or thyromegaly.  Cardiovascular:     Rate and Rhythm: Normal rate and regular rhythm.     Pulses:          Dorsalis pedis pulses are 2+ on the right side and 2+ on the left side.     Heart sounds: No murmur heard. Pulmonary:     Effort: Pulmonary effort is normal. No respiratory distress.     Breath sounds: Normal breath sounds.  Abdominal:     Palpations: Abdomen is soft. There is no hepatomegaly or mass.     Tenderness: There is no abdominal tenderness.  Genitourinary:    Comments: Deferred to gyn. Musculoskeletal:     Right shoulder: Tenderness (with movement) present. Decreased range of motion.     Comments: No signs of synovitis appreciated.  Lymphadenopathy:     Cervical: No cervical adenopathy.  Skin:    General: Skin is warm.     Findings: No erythema or rash.  Neurological:     General: No focal deficit present.     Mental Status: She is alert and oriented to person, place, and time.     Cranial Nerves: No cranial nerve deficit.     Coordination: Coordination normal.     Gait: Gait normal.     Deep Tendon Reflexes:     Reflex Scores:      Bicep reflexes are 2+ on the right side and 2+ on the left side.      Patellar reflexes are 2+ on the right side and 2+ on the left side. Psychiatric:        Mood and Affect: Mood and affect normal.   ASSESSMENT AND PLAN: [name] was here today annual physical examination.  Orders Placed This Encounter  Procedures   C-reactive protein   Sedimentation rate   VITAMIN D  25 Hydroxy (Vit-D Deficiency, Fractures)   Lipid panel   Hemoglobin A1c   Basic metabolic panel with GFR   CBC   Cortisol, free, Serum   Ambulatory referral to Rheumatology   Lab Results  Component Value Date   CRP <1.0  04/18/2024   Lab Results  Component Value Date   ESRSEDRATE 24 04/18/2024   Lab Results  Component Value Date   VD25OH 42.50 04/18/2024   Lab Results  Component Value Date   HGBA1C 5.8 04/18/2024   Lab Results  Component Value Date   NA 140 04/18/2024   CL 101 04/18/2024   K 3.4 (L) 04/18/2024   CO2 30 04/18/2024   BUN 16 04/18/2024   CREATININE 0.41 04/18/2024   GFR 102.39 04/18/2024   CALCIUM 9.5 04/18/2024   ALBUMIN 4.3 09/24/2023   GLUCOSE 83 04/18/2024   Lab Results  Component Value Date   WBC 3.9 (L) 04/18/2024   HGB 12.0 04/18/2024   HCT 35.7 (L) 04/18/2024   MCV 88.2 04/18/2024   PLT 300.0 04/18/2024   Lab Results  Component Value Date   CHOL 203 (H) 04/18/2024   HDL 61.40 04/18/2024   LDLCALC 126 (H) 04/18/2024   LDLDIRECT 150.4 10/21/2013  TRIG 78.0 04/18/2024   CHOLHDL 3 04/18/2024   The 10-year ASCVD risk score (Arnett DK, et al., 2019) is: 6%   Values used to calculate the score:     Age: 76 years     Clincally relevant sex: Female     Is Non-Hispanic African American: No     Diabetic: No     Tobacco smoker: No     Systolic Blood Pressure: 110 mmHg     Is BP treated: Yes     HDL Cholesterol: 61.4 mg/dL     Total Cholesterol: 203 mg/dL  Routine general medical examination at a health care facility Assessment & Plan: We discussed the importance of regular physical activity and healthy diet for prevention of chronic illness and/or complications. Preventive guidelines reviewed. Vaccination up to date. Ca++ and vit D supplementation to continue. She sees gynecologist oncologist regularly. Next CPE in a year.  PMR (polymyalgia rheumatica) (HCC) Assessment & Plan: She has not been on prednisone  for over 6 months due to immunotherapy treatment for vulvar cancer. The symptoms she reports today could be related to this problem. OTC ibuprofen  helps with pain, we discussed some side effects. Appointment with rheumatology will be  arranged.  Orders: -     C-reactive protein; Future -     Sedimentation rate; Future -     CBC; Future -     Ambulatory referral to Rheumatology  Hyperlipidemia, unspecified hyperlipidemia type Assessment & Plan: Currently on nonpharmacologic treatment, has declined statins in the past. Further recommendations according to FLP result.  Orders: -     Lipid panel; Future  Prediabetes Assessment & Plan: Last hemoglobin A1c 5.8 in 09/2022. Encouraged a healthy lifestyle for diabetes prevention. Further recommendation will be given according to hemoglobin A1c result.  Orders: -     Hemoglobin A1c; Future  Vitamin D  deficiency, unspecified Assessment & Plan: Continue OTC vitamin D  2000 units daily. Further recommendation will be given according to 25 [place] vitamin D  result.  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 She sees gynecologist oncologist regularly. Scheduled for abd/pelvic CT in 05/2024.  Hypokalemia Assessment & Plan: On chronic diuretic therapy. Last potassium 3.6 in 09/2023. Continue K-Lor 20 mg daily.  Orders: -     Basic metabolic panel with GFR; Future -     Cortisol, free, Serum; Future  Patient request for diagnostic testing -     Cortisol, free, Serum; Future  Polyarthralgia We discussed possible etiologies, he can certainly be related with PMR but generalized OA is also in the differential diagnosis. Further recommendation will be given according to lab results. Continue ibuprofen  200 to 400 mg up to 3 times per day with food if needed. Appointment with dermatology will be arranged.  -     Ambulatory referral to Rheumatology  Bilateral lower extremity edema Assessment & Plan: Problem is well-controlled. Currently she is taking HCTZ 25 mg daily, we discussed some side  effects. Lower extremity elevation and compression stockings will also help.  Orders: -     hydroCHLOROthiazide ; Take 1 tablet (25 mg total) by mouth daily as needed.  Dispense: 90 tablet; Refill: 2  Return in 1 year (on 04/18/2025) for CPE, chronic problems, Labs.  I, Vernell Forest, acting as a scribe for Blessin Kanno [place], MD., have documented all relevant documentation on the behalf of Dickey  [place], MD, as directed by   while in the presence of Briget Shaheed [place], MD.  I, Chamari Cutbirth [place], MD, have reviewed all documentation for this visit. The documentation on 04/18/24 for the exam, diagnosis, procedures, and orders are all accurate and complete.  Vineeth Fell G. [place], MD  [facility]

## 2024-04-18 NOTE — Assessment & Plan Note (Signed)
 We discussed the importance of regular physical activity and healthy diet for prevention of chronic illness and/or complications. Preventive guidelines reviewed. Vaccination up to date. Ca++ and vit D supplementation to continue. She sees gynecologist oncologist regularly. Next CPE in a year.

## 2024-04-18 NOTE — Assessment & Plan Note (Signed)
 Currently on nonpharmacologic treatment, has declined statins in the past. Further recommendations according to FLP result.

## 2024-04-18 NOTE — Assessment & Plan Note (Signed)
 Completed immunotherapy in 08/2024. She sees gynecologist oncologist regularly. Scheduled for abd/pelvic CT in 05/2024.

## 2024-04-18 NOTE — Assessment & Plan Note (Signed)
 Problem is well-controlled. Currently she is taking HCTZ 25 mg daily, we discussed some side effects. Lower extremity elevation and compression stockings will also help.

## 2024-04-18 NOTE — Assessment & Plan Note (Signed)
 On chronic diuretic therapy. Last potassium 3.6 in 09/2023. Continue K-Lor 20 mg daily.

## 2024-04-18 NOTE — Assessment & Plan Note (Signed)
Continue OTC vitamin D 2000 units daily. Further recommendation will be given according to 25 OH vitamin D result. 

## 2024-04-18 NOTE — Assessment & Plan Note (Signed)
 Last hemoglobin A1c 5.8 in 09/2022. Encouraged a healthy lifestyle for diabetes prevention. Further recommendation will be given according to hemoglobin A1c result.

## 2024-04-18 NOTE — Patient Instructions (Addendum)
 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 Do not use My Chart to request refills or for acute issues that need immediate attention. If you send a my chart message, it may take a few days to be addressed, specially if I am not in the office.  Please be sure medication list is accurate. If a new problem present, please set up appointment sooner than planned today.  Health Maintenance, Female Adopting a healthy lifestyle and getting preventive care are important in promoting health and wellness. Ask your health care provider about: The right schedule for you to have regular tests and exams. Things you can do on your own to prevent diseases and keep yourself healthy. What should I know about diet, weight, and exercise? Eat a healthy diet  Eat a diet that includes plenty of vegetables, fruits, low-fat dairy products, and lean protein. Do not eat a lot of foods that are high in solid fats, added sugars, or sodium. Maintain a healthy weight Body mass index (BMI) is used to identify weight problems. It estimates body fat based on height and weight. Your health care provider can help determine your BMI and help you achieve  or maintain a healthy weight. Get regular exercise Get regular exercise. This is one of the most important things you can do for your health. Most adults should: Exercise for at least 150 minutes each week. The exercise should increase your heart rate and make you sweat (moderate-intensity exercise). Do strengthening exercises at least twice a week. This is in addition to the moderate-intensity exercise. Spend less time sitting. Even light physical activity can be beneficial. Watch cholesterol and blood lipids Have your blood tested for lipids and cholesterol at 67 years of age, then have this test every 5 years. Have your cholesterol levels checked more often if: Your lipid or cholesterol levels are high. You are older than 67 years of age. You are at high risk for heart disease. What should I know about cancer screening? Depending on your health history and family history, you may need to have cancer screening at various ages. This may include screening for: Breast cancer. Cervical cancer. Colorectal cancer. Skin cancer. Lung cancer. What should I know about heart disease, diabetes, and high blood pressure? Blood pressure and heart disease High blood pressure causes heart disease and increases the risk of stroke. This is more likely to develop in people who have high blood pressure readings or are overweight. Have your blood pressure checked: Every 3-5 years if you are 83-63 years of age. Every year if you are 22 years old or older. Diabetes Have regular diabetes screenings. This checks your fasting blood sugar level. Have the screening done: Once every three years after age 30 if you are at a normal weight and have a low risk for diabetes. More often and  at a younger age if you are overweight or have a high risk for diabetes. What should I know about preventing infection? Hepatitis B If you have a higher risk for hepatitis B, you should be screened for this virus. Talk with your  health care provider to find out if you are at risk for hepatitis B infection. Hepatitis C Testing is recommended for: Everyone born from [birth year] through 1964. Anyone with known risk factors for hepatitis C. Sexually transmitted infections (STIs) Get screened for STIs, including gonorrhea and chlamydia, if: You are sexually active and are younger than 67 years of age. You are older than 67 years of age and your health care provider tells you that you are at risk for this type of infection. Your sexual activity has changed since you were last screened, and you are at increased risk for chlamydia or gonorrhea. Ask your health care provider if you are at risk. Ask your health care provider about whether you are at high risk for HIV. Your health care provider may recommend a prescription medicine to help prevent HIV infection. If you choose to take medicine to prevent HIV, you should first get tested for HIV. You should then be tested every 3 months for as long as you are taking the medicine. Pregnancy If you are about to stop having your period (premenopausal) and you may become pregnant, seek counseling before you get pregnant. Take 400 to 800 micrograms (mcg) of folic acid every day if you become pregnant. Ask for birth control (contraception) if you want to prevent pregnancy. Osteoporosis and menopause Osteoporosis is a disease in which the bones lose minerals and strength with aging. This can result in bone fractures. If you are 39 years old or older, or if you are at risk for osteoporosis and fractures, ask your health care provider if you should: Be screened for bone loss. Take a calcium or vitamin D  supplement to lower your risk of fractures. Be given hormone replacement therapy (HRT) to treat symptoms of menopause. Follow these instructions at home: Alcohol use Do not drink alcohol if: Your health care provider tells you not to drink. You are pregnant, may be pregnant, or are planning to  become pregnant. If you drink alcohol: Limit how much you have to: 0-1 drink a day. Know how much alcohol is in your drink. In the [place] one drink equals one 12 oz bottle of beer (355 mL), one 5 oz glass of wine (148 mL), or one 1 oz glass of hard liquor (44 mL). Lifestyle Do not use any products that contain nicotine or tobacco. These products include cigarettes, chewing tobacco, and vaping devices, such as e-cigarettes. If you need help quitting, ask your health care provider. Do not use street drugs. Do not share needles. Ask your health care provider for help if you need support or information about quitting drugs. General instructions Schedule regular health, dental, and eye exams. Stay current with your vaccines. Tell your health care provider if: You often feel depressed. You have ever been abused or do not feel safe at home. Summary Adopting a healthy lifestyle and getting preventive care are important in promoting health and wellness. Follow your health care provider's instructions about healthy diet, exercising, and getting tested or screened for diseases. Follow your health care provider's instructions on monitoring your cholesterol and blood pressure. This information is not intended to replace advice given to you by your health care provider. Make sure you discuss any questions you have with your  health care provider. Document Revised: 01/31/2021 Document Reviewed: 01/31/2021 Elsevier Patient Education  2024 [employer].

## 2024-04-18 NOTE — Assessment & Plan Note (Addendum)
 She has not been on prednisone  for over 6 months due to immunotherapy treatment for vulvar cancer. The symptoms she reports today could be related to this problem. OTC ibuprofen  helps with pain, we discussed some side effects. Appointment with rheumatology will be arranged.

## 2024-05-02 LAB — CORTISOL, FREE: Cortisol Free, Ser: 0.46 ug/dL

## 2024-05-20 ENCOUNTER — Encounter: Payer: Self-pay | Admitting: Gynecologic Oncology

## 2024-05-23 ENCOUNTER — Inpatient Hospital Stay: Attending: Gynecologic Oncology | Admitting: Gynecologic Oncology

## 2024-05-23 ENCOUNTER — Encounter: Payer: Self-pay | Admitting: Gynecologic Oncology

## 2024-05-23 VITALS — BP 120/73 | HR 65 | Temp 98.7°F | Resp 19 | Wt 170.8 lb

## 2024-05-23 DIAGNOSIS — M353 Polymyalgia rheumatica: Secondary | ICD-10-CM | POA: Diagnosis not present

## 2024-05-23 DIAGNOSIS — Z9221 Personal history of antineoplastic chemotherapy: Secondary | ICD-10-CM | POA: Insufficient documentation

## 2024-05-23 DIAGNOSIS — Z08 Encounter for follow-up examination after completed treatment for malignant neoplasm: Secondary | ICD-10-CM | POA: Insufficient documentation

## 2024-05-23 DIAGNOSIS — C519 Malignant neoplasm of vulva, unspecified: Secondary | ICD-10-CM

## 2024-05-23 DIAGNOSIS — Z9079 Acquired absence of other genital organ(s): Secondary | ICD-10-CM | POA: Insufficient documentation

## 2024-05-23 DIAGNOSIS — Z8544 Personal history of malignant neoplasm of other female genital organs: Secondary | ICD-10-CM | POA: Insufficient documentation

## 2024-05-23 NOTE — Progress Notes (Signed)
 Gynecologic Oncology Return Clinic Visit  05/23/24  Reason for Visit: follow-up   Treatment History: Oncology History Overview Note  BRAF: undetectable (negative)   Melanoma of vulva (HCC)  10/26/2020 Initial Diagnosis   She was noted to have bloody discharge in her underwear.  She denies pain   12/07/2020 Initial Biopsy   FINAL MICROSCOPIC DIAGNOSIS:   A.   RIGHT VULVA, BIOPSY: PRIMARY MALIGNANT MELANOMA OF VULVA, MELANOMA  TABLE BELOW  MALIGNANT MELANOMA (AJCC 8TH EDITION):  PROCEDURE: BIOPSY  SPECIMEN ANATOMIC SITE: RIGHT VULVA  BRESLOW'S DEPTH/MAXIMUM TUMOR THICKNESS: 5.05 MM  CLARK/ANATOMIC LEVEL* IV  MARGINS:  PERIPHERAL: INVOLVED  DEEP: FREE  ULCERATION: PRESENT  MITOTIC INDEX: 11/mm2  LYMPHO-VASCULAR INVASION: PRESENT*  NEUTROTROPISM: ABSENT  TUMOR-INFILTRATING LYMPHOCYTES: NON-BRISK  LYMPH NODES: N/A  PATHOLOGIC STAGE: PT4B NX MX  *VULVAR MELANOMAS MAY NOT HAVE THE SAME BEHAVIOR STAGE FOR STAGE AS SUPERFICIAL SPREADING CUTANEOUS MELANOMA, AND LANDMARKS SUCH AS BRESLOW'S DEPTH MAY NOT APPLY. NONETHELESS, THIS SHOULD BE TREATED AS A PT4B MELANOMA AND COMPLETE EXCISION, WITH POSSIBLE LYMPH NODE EVALUATION, UNDERTAKEN. SEE COMMENT FOR IHC PROFILE.    COMMENT:  Sections show confluent and nested growth of atypical melanocytes along vulvar epithelium in an in-situ melanoma growth pattern at the periphery, with a central large ulcerated nodule of similar atypical  melanocytes, consonant with primary vulvar melanoma. Notably, IHC was performed to evaluate the lineage and is diagnostic for melanoma. Lesional melanocytes are positive with S100, MelanA, and show a high ki-67 uptake, and are negative with CDX-2, synaptophysin, CK20, TTF-1, CK7, CD56, and lymphoid markers CD5, CD3, CD20 highlight only background lymphocytes.    12/17/2020 Initial Diagnosis   Melanoma of vulva (HCC)   12/17/2020 Cancer Staging   Staging form: Melanoma of the Skin, AJCC 8th Edition - Clinical stage  from 12/17/2020: Stage III (rcT4b, cN2, cM0) - Signed by Lonn Hicks, MD on 10/13/2022 Stage prefix: Recurrence   12/30/2020 PET scan   1. The only potential hypermetabolic finding of note is a small flat focus of accentuated metabolic activity along the anterior vaginal vestibule/perineum, maximum SUV 6.5. Most common cause for this type of appearance would be a trace amount of incontinence of urinary FDG, but given the proximity to the reported vulvar melanoma, this small focus is considered indeterminate. 2. Other imaging findings of potential clinical significance: Aortic Atherosclerosis (ICD10-I70.0). Small type 1 hiatal hernia. Palatine tonsilloliths. Colonic diverticulosis.   01/18/2021 Surgery   Preoperative Diagnosis: Vulvar melanoma  Postoperative Diagnosis: Same  Procedures performed: Wide radical vulvectomy, R inguinofemoral sentinel LN biopsy  Attending Surgeon: Comer Dollar, MD  Fellow: Maurine DELENA Mule  Resident: Andriette Clause, MD; Katelyn Rittenhouse, MD  Anesthesia: General endotracheal  Findings:  1. Vulva with biopsy site changes at R labia minora adjacent to the clitoris. No visible tumor. 2. Inguinofemoral nodes not clinically enlarged or abnormal.   Specimens:  1. R inguinal sentinel LN, hot and blue 2. Portion of vulva 3. Superior deep margin    01/18/2021 Pathology Results   Only melanoma in situ seen in final resection, SLN negative   02/08/2021 - 03/22/2021 Chemotherapy         03/29/2021 Genetic Testing   Negative genetic testing on the Multi-cancer +RNA panel.  The report date is March 29, 2021.  The Multi-Gene Panel offered by [facility] includes sequencing and/or deletion duplication testing of the following 84 genes: AIP, ALK, APC, ATM, AXIN2,BAP1,  BARD1, BLM, BMPR1A, BRCA1, BRCA2, BRIP1, CASR, CDC73, CDH1, CDK4, CDKN1B, CDKN1C, CDKN2A (p14ARF), CDKN2A (p16INK4a),  CEBPA, CHEK2, CTNNA1, DICER1, DIS3L2, EGFR (c.2369C>T, p.Thr790Met variant only), EPCAM  (Deletion/duplication testing only), FH, FLCN, GATA2, GPC3, GREM1 (Promoter region deletion/duplication testing only), HOXB13 (c.251G>A, p.Gly84Glu), HRAS, KIT, MAX, MEN1, MET, MITF (c.952G>A, p.Glu318Lys variant only), MLH1, MSH2, MSH3, MSH6, MUTYH, NBN, NF1, NF2, NTHL1, PALB2, PDGFRA, PHOX2B, PMS2, POLD1, POLE, POT1, PRKAR1A, PTCH1, PTEN, RAD50, RAD51C, RAD51D, RB1, RECQL4, RET, RUNX1, SDHAF2, SDHA (sequence changes only), SDHB, SDHC, SDHD, SMAD4, SMARCA4, SMARCB1, SMARCE1, STK11, SUFU, TERC, TERT, TMEM127, TP53, TSC1, TSC2, VHL, WRN and WT1.     04/29/2021 PET scan   1. No vulvar mass or residual hypermetabolism. 2. No findings suspicious for metastatic disease.   11/28/2021 Pathology Results   Vulvar biopsy - fibrosis c/w scar. Small junctional nevus. No melanoma identified.   08/31/2022 PET scan   1. Enlarging RIGHT inguinal lymph node suspicious for metastatic disease. This shows only mild to moderate increased metabolic activity but displays FDG uptake greater than blood pool and shows abnormal morphology. Tissue sampling may be helpful. 2. No additional signs of disease in the neck, chest, abdomen or pelvis. 3. Signs of pelvic floor dysfunction and probable cystocele. 4. Chronic fracture of RIGHT inferior pubic ramus signs of healing since previous imaging. 5. Aortic atherosclerosis. 6. Moderate to large hiatal hernia.   Aortic Atherosclerosis (ICD10-I70.0).   09/04/2022 Pathology Results   FINAL MICROSCOPIC DIAGNOSIS:   A. LYMPH NODE, RIGHT INGUINAL, BIOPSY:  Metastatic melanoma.  See comment.   COMMENT:  The tumor cells are loosely cohesive with foci of cytoplasmic pigment. Immunohistochemistry is positive with Melan-A, S100 and SOX10.  The morphology and immunophenotype are consistent with metastatic melanoma.      09/27/2022 Surgery   Surgery: Excision of enlarged right inguinal lymph node   Operative findings: 2 cm palpable but mobile right inguinal lymph node, hyperpigmented.  No other palpable adenopathy.   11/13/2022 -  Chemotherapy   Patient is on Treatment Plan : MELANOMA Nivolumab  (480) q28d     01/03/2023 Imaging   1. Enlarged right inguinal lymph node measuring 1.1 cm in short axis, similar to prior PET-CT. 2. No other potential sites of metastatic disease noted elsewhere in the chest, abdomen or pelvis. 3. Moderate-sized hiatal hernia. 4. Colonic diverticulosis without evidence of acute diverticulitis at this time. 5. Aortic atherosclerosis. 6. Additional incidental findings, as above.     03/26/2023 Imaging   CT ABDOMEN PELVIS W CONTRAST  Result Date: 03/27/2023 CLINICAL DATA:  Melanoma, assess treatment response * Tracking Code: BO * EXAM: CT ABDOMEN AND PELVIS WITH CONTRAST TECHNIQUE: Multidetector CT imaging of the abdomen and pelvis was performed using the standard protocol following bolus administration of intravenous contrast. RADIATION DOSE REDUCTION: This exam was performed according to the departmental dose-optimization program which includes automated exposure control, adjustment of the mA and/or kV according to patient size and/or use of iterative reconstruction technique. CONTRAST:  OMNIPAQUE  IOHEXOL  300 MG/ML  SOLN COMPARISON:  CT scan chest, abdomen and pelvis from 01/01/2023. FINDINGS: Lower chest: There are subpleural atelectatic changes in the visualized lung bases. No overt consolidation. No pleural effusion. The heart is normal in size. No pericardial effusion. 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 Non-cirrhotic configuration. No suspicious mass. Note is made of a lobulated 2.4 x 4.2 cm cyst in the left hepatic lobe, stable since the prior study. There is a stable subcentimeter hypoattenuating focus in the left hepatic lobe, segment 4B, which is too small to adequately characterize but appears unchanged  since the prior study and favored benign as well. No intrahepatic or  extrahepatic bile duct dilation. No calcified gallstones. Normal gallbladder wall thickness. No pericholecystic inflammatory changes. Pancreas: Unremarkable. No pancreatic ductal dilatation or surrounding inflammatory changes. Spleen: Within normal limits. No focal lesion. Adrenals/Urinary Tract: Adrenal glands are unremarkable. No suspicious renal mass. Redemonstration of several sinus cysts in the left kidney. No hydronephrosis. No renal or ureteric calculi. Urinary bladder is under distended, precluding optimal assessment. However, no large mass or stones identified. No perivesical fat stranding. Stomach/Bowel: No disproportionate dilation of the small or large bowel loops. No evidence of abnormal bowel wall thickening or inflammatory changes. The appendix is unremarkable. There are multiple diverticula throughout the colon, without imaging signs of diverticulitis. There is a small-to-moderate hiatal hernia. Vascular/Lymphatic: No ascites or pneumoperitoneum. No abdominal or pelvic lymphadenopathy, by size criteria. Previously noted right inguinal lymph node (series 2, image 110 of study from 01/01/2023) adjacent to the deepest surgical staple is no longer seen on this exam. There was also another smaller lymph node (series 2, image 112 of study dated 01/01/2023), which is also not seen on today's exam. No aneurysmal dilation of the major abdominal arteries. Reproductive: The uterus is unremarkable. No large adnexal mass. Other: There is a tiny fat containing umbilical hernia. There are metallic staples and postsurgical changes in the right inguinal region. Musculoskeletal: No suspicious osseous lesions. There are mild multilevel degenerative changes in the visualized spine. IMPRESSION: 1. No evidence of metastatic melanoma in the abdomen or pelvis. Previously noted right inguinal lymph nodes are no longer seen on today's exam. 2. Multiple other nonacute observations, as described above. Electronically Signed    By: Ree Molt M.D.   On: 03/27/2023 15:11      08/14/2023 Imaging   CT ABDOMEN PELVIS W CONTRAST  Result Date: 08/17/2023 CLINICAL DATA:  History of vulvar melanoma diagnosed in 2022 with prior recurrence in right inguinal lymph node, on immunotherapy. * Tracking Code: BO * EXAM: CT ABDOMEN AND PELVIS WITH CONTRAST TECHNIQUE: Multidetector CT imaging of the abdomen and pelvis was performed using the standard protocol following bolus administration of intravenous contrast. RADIATION DOSE REDUCTION: This exam was performed according to the departmental dose-optimization program which includes automated exposure control, adjustment of the mA and/or kV according to patient size and/or use of iterative reconstruction technique. CONTRAST:  OMNIPAQUE  IOHEXOL  300 MG/ML  SOLN COMPARISON:  CT abdomen and pelvis dated 03/26/2023 FINDINGS: Lower chest: No focal consolidation or pulmonary nodule in the lung bases. No pleural effusion or pneumothorax demonstrated. Partially imaged heart size is normal. Hepatobiliary: Similar segment 2 cyst. Unchanged segment 4 subcentimeter hypodensity (2:27), too small to characterize but also likely cysts. No intra or extrahepatic biliary ductal dilation. Normal gallbladder. Pancreas: No focal lesions or main ductal dilation. Spleen: Normal in size without focal abnormality. Adrenals/Urinary Tract: No adrenal nodules. No suspicious renal mass, calculi or hydronephrosis. Simple left parapelvic cysts. No specific follow-up imaging recommended. No focal bladder wall thickening. Stomach/Bowel: Small hiatal hernia. Normal appearance of the stomach. No evidence of bowel wall thickening, distention, or inflammatory changes. Colonic diverticulosis without acute diverticulitis. Normal appendix. Vascular/Lymphatic: Aortic atherosclerosis. No enlarged abdominal or pelvic lymph nodes. Reproductive: No adnexal masses. Other: No free fluid, fluid collection, or free air. Musculoskeletal: No  acute or abnormal lytic or blastic osseous lesions. Multilevel degenerative changes of the partially imaged thoracic and lumbar spine. Postsurgical changes of the right inguinal region. IMPRESSION: 1. No evidence of metastatic disease in the  abdomen or pelvis. 2.  Aortic Atherosclerosis (ICD10-I70.0). Electronically Signed   By: Limin  Xu M.D.   On: 08/17/2023 14:16      11/30/2023 Imaging   CT C/A/P: 1. No evidence of metastatic disease within the chest, abdomen or pelvis. 2. Moderate size hiatal hernia. 3. Colonic diverticulosis without findings of acute diverticulitis. 4. Aortic atherosclerosis     Interval History: Overall doing very well.  Although she continues to have some discomfort related to her polymyalgia rheumatica, she notes overall she thinks this is improved.  The changes she has made are using exercise bike daily for about 30 minutes for the last 2 months and recently adding turmeric.  He is scheduled to see a rheumatologist later next month.  Sees dermatology in late September.  Denies any vulvar symptoms or new vulvar lesions.  Past Medical/Surgical History: 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   melanoma   Complication of anesthesia    hard to wake up after anesthesia   Family history of melanoma    History of kidney stones    Melanotic macule of vulvar labia    Migraine    Polymyalgia rheumatica (HCC)    Venous insufficiency     Past Surgical History:  Procedure Laterality Date   Cataract Right 05/22/2022   CATARACT EXTRACTION Left 06/05/2022   COLONOSCOPY     EYE SURGERY     Cataracts   INGUINAL LYMPHADENECTOMY Right 09/27/2022   Procedure: RIGHT INGUINAL LYMPH NODE DEBULKING;  Surgeon: Viktoria Comer SAUNDERS, MD;  Location: [facility];  Service: Gynecology;  Laterality: Right;   VULVECTOMY  01/19/2021   done at [facility], had sentinel node biopsy also    WRIST SURGERY  05/15/2023   Left    Family History  Problem Relation Age of Onset   Diabetes Mother    Cancer Mother    AVM Father    Melanoma Father        multiple melanomas dx between 24-90   Cancer Father 31       tumor on kidney   Kidney disease Father    Cancer Paternal Grandfather        laryngeal cancer   Cancer Daughter 66       appendiceal cancer   Obesity Daughter    Breast cancer Neg Hx    Ovarian cancer Neg Hx    Colon cancer Neg Hx    Uterine cancer Neg Hx    Colon polyps Neg Hx    Esophageal cancer Neg Hx    Stomach cancer Neg Hx    Rectal cancer Neg Hx     Social History   Socioeconomic History   Marital status: Married    Spouse name: Not on file   Number of children: 2   Years of education: Not on file   Highest education level: 12th grade  Occupational History   Occupation: Estate manager/land agent   Occupation: stocking  Tobacco Use   Smoking status: Never   Smokeless tobacco: Never  Vaping Use   Vaping status: Never Used  Substance and Sexual Activity   Alcohol use: Yes    Comment: occasional   Drug use: No   Sexual activity: Not Currently    Birth control/protection: Post-menopausal  Other Topics Concern   Not on file  Social History Narrative   Not on file   Social  Drivers of Health   Financial Resource Strain: Low Risk  (04/11/2024)   Overall Financial Resource Strain (CARDIA)    Difficulty of Paying Living Expenses: Not very hard  Food Insecurity: No Food Insecurity (04/11/2024)   Hunger Vital Sign    Worried About Running Out of Food in the Last Year: Never true    Ran Out of Food in the Last Year: Never true  Transportation Needs: No Transportation Needs (04/11/2024)   PRAPARE - Administrator, Civil Service (Medical): No    Lack of Transportation (Non-Medical): No  Physical Activity: Insufficiently Active (04/11/2024)   Exercise Vital Sign    Days of Exercise per Week: 7 days    Minutes of Exercise per Session: 20 min   Stress: No Stress Concern Present (04/11/2024)   [employer] of Occupational Health - Occupational Stress Questionnaire    Feeling of Stress: Not at all  Social Connections: Socially Isolated (04/11/2024)   Social Connection and Isolation Panel    Frequency of Communication with Friends and Family: Once a week    Frequency of Social Gatherings with Friends and Family: Once a week    Attends Religious Services: Never    Database administrator or Organizations: No    Attends Engineer, structural: Not on file    Marital Status: Married    Current Medications:  Current Outpatient Medications:    acetaminophen  (TYLENOL ) 650 MG CR tablet, Take 650 mg by mouth every 8 (eight) hours as needed for pain., Disp: , Rfl:    calcium carbonate (TUMS - DOSED IN MG ELEMENTAL CALCIUM) 500 MG chewable tablet, Chew 1-2 tablets by mouth daily as needed for indigestion or heartburn., Disp: , Rfl:    hydrochlorothiazide  (HYDRODIURIL ) 25 MG tablet, Take 1 tablet (25 mg total) by mouth daily as needed. (Patient taking differently: Take 25 mg by mouth daily.), Disp: 90 tablet, Rfl: 2   ibuprofen  (ADVIL ) 400 MG tablet, Take 400 mg by mouth every 6 (six) hours as needed for moderate pain. (Patient taking differently: Take 400 mg by mouth daily.), Disp: , Rfl:    Menthol-Methyl Salicylate (SALONPAS PAIN RELIEF PATCH EX), Apply 1 patch topically daily as needed (pain)., Disp: , Rfl:    Multiple Vitamins-Minerals (MULTIVITAMIN WOMEN 50+ PO), Take 1 tablet by mouth daily., Disp: , Rfl:    potassium chloride  SA (KLOR-CON  M) 20 MEQ tablet, TAKE 1 TABLET BY MOUTH EVERY DAY, Disp: 90 tablet, Rfl: 1   Turmeric 01-999 MG CAPS, , Disp: , Rfl:   Review of Systems: Denies appetite changes, fevers, chills, fatigue, unexplained weight changes. Denies hearing loss, neck lumps or masses, mouth sores, ringing in ears or voice changes. Denies cough or wheezing.  Denies shortness of breath. Denies chest pain or  palpitations. Denies leg swelling. Denies abdominal distention, pain, blood in stools, constipation, diarrhea, nausea, vomiting, or early satiety. Denies pain with intercourse, dysuria, frequency, hematuria or incontinence. Denies hot flashes, pelvic pain, vaginal bleeding or vaginal discharge.   Denies joint pain, back pain or muscle pain/cramps. Denies itching, rash, or wounds. Denies dizziness, headaches, numbness or seizures. Denies swollen lymph nodes or glands, denies easy bruising or bleeding. Denies anxiety, depression, confusion, or decreased concentration.  Physical Exam: BP 120/73 (BP Location: Left Arm, Patient Position: Sitting)   Pulse 65   Temp 98.7 F (37.1 C) (Oral)   Resp 19   Wt 170 lb 12.8 oz (77.5 kg)   SpO2 96%   BMI 31.24 kg/m  General: Alert, oriented, no acute distress. HEENT: Normocephalic, atraumatic, sclera anicteric. Chest: Clear to auscultation bilaterally.  No wheezes or rhonchi. Cardiovascular: Regular rate and rhythm, no murmurs. Abdomen: soft, nontender.  Normoactive bowel sounds.  No masses or hepatosplenomegaly appreciated.   Extremities: Grossly normal range of motion.  Warm, well perfused.  No edema bilaterally. Skin: No rashes or lesions noted. Lymphatics: No cervical, supraclavicular, or inguinal adenopathy. GU: External female genitalia notable for changes from prior surgery along the right vulva.  There is mild discoloration along the most inferior aspect of the incision, stable.   No other areas of discoloration, lesions, or atypical vasculature.   No new lesions.  Moderate atrophy of the vulva, loss of posterior labia bilaterally.  On speculum exam, cervix normal appearing.  No vaginal lesions noted. Vaginal mucosa normal on palpation.  Laboratory & Radiologic Studies: Groin ultrasound 02/20/24: Evaluation of bilateral inguinal lymph nodes demonstrates 2 right and 1 left nonenlarged nodes with normal architecture.  Assessment & Plan: Ariel Rios is a 68 y.o. woman with recurrent vulvar melanoma, treated with excision of inguinal lymph node (09/2022), completed nivolumab  (08/2023).  Imaging in 12/2022 with enlarged right inguinal lymph node, resolved as of 03/2023 imaging.  She completed 12 cycles of nivolumab  at the end of 2024.   Last imaging in 11/2023 was negative for recurrent disease. Recent groin ultrasound shows several borderline lymph nodes that are normal in architecture.   The patient is overall doing well, NED on exam today.    Symptoms related to her polymyalgia rheumatica have improved some since I last saw her.  She is scheduled to see rheumatology next month.  She had mildly enlarged lymph nodes although normal architecture on her last groin ultrasound.  Her next follow-up CT scan is next month.  I will contact her with these results. If any findings concerning in terms of size of lymph nodes or their appearance on her upcoming CT scan, would plan on PET scan.   I will see the patient back for 45-month follow-up and exam.  22 minutes of total time was spent for this patient encounter, including preparation, face-to-face counseling with the patient and coordination of care, and documentation of the encounter.  Comer Dollar, MD  Division of Gynecologic Oncology  Department of Obstetrics and Gynecology  [facility]

## 2024-05-23 NOTE — Patient Instructions (Signed)
 It was good to see you today.  I do not see or feel any evidence of cancer recurrence on your exam.  I will see you for follow-up in 3 months.  I will let you know when I get your CT results.  As always, if you develop any new and concerning symptoms before your next visit, please call to see me sooner.

## 2024-05-24 ENCOUNTER — Other Ambulatory Visit: Payer: Self-pay

## 2024-06-11 ENCOUNTER — Ambulatory Visit (HOSPITAL_COMMUNITY)
Admission: RE | Admit: 2024-06-11 | Discharge: 2024-06-11 | Disposition: A | Discharge status: Home / Self Care | Source: Ambulatory Visit | Attending: Gynecologic Oncology | Admitting: Gynecologic Oncology

## 2024-06-11 DIAGNOSIS — K5730 Diverticulosis of large intestine without perforation or abscess without bleeding: Secondary | ICD-10-CM | POA: Diagnosis not present

## 2024-06-11 DIAGNOSIS — N281 Cyst of kidney, acquired: Secondary | ICD-10-CM | POA: Diagnosis not present

## 2024-06-11 DIAGNOSIS — K7689 Other specified diseases of liver: Secondary | ICD-10-CM | POA: Diagnosis not present

## 2024-06-11 DIAGNOSIS — C519 Malignant neoplasm of vulva, unspecified: Secondary | ICD-10-CM | POA: Insufficient documentation

## 2024-06-11 DIAGNOSIS — I700 Atherosclerosis of aorta: Secondary | ICD-10-CM | POA: Diagnosis not present

## 2024-06-11 MED ORDER — IOHEXOL 9 MG/ML PO SOLN
ORAL | Status: AC
  Filled 2024-06-11: qty 1000

## 2024-06-11 MED ORDER — IOHEXOL 9 MG/ML PO SOLN
1000.0000 mL | ORAL | Status: AC
  Administered 2024-06-11: 1000 mL via ORAL

## 2024-06-11 MED ORDER — IOHEXOL 300 MG/ML  SOLN
100.0000 mL | Freq: Once | INTRAMUSCULAR | Status: AC | PRN
  Administered 2024-06-11: 100 mL via INTRAVENOUS

## 2024-06-13 ENCOUNTER — Ambulatory Visit: Payer: Self-pay | Admitting: Gynecologic Oncology

## 2024-06-16 DIAGNOSIS — Z85820 Personal history of malignant melanoma of skin: Secondary | ICD-10-CM | POA: Diagnosis not present

## 2024-06-16 DIAGNOSIS — D225 Melanocytic nevi of trunk: Secondary | ICD-10-CM | POA: Diagnosis not present

## 2024-06-16 DIAGNOSIS — L814 Other melanin hyperpigmentation: Secondary | ICD-10-CM | POA: Diagnosis not present

## 2024-06-16 DIAGNOSIS — D485 Neoplasm of uncertain behavior of skin: Secondary | ICD-10-CM | POA: Diagnosis not present

## 2024-06-16 DIAGNOSIS — L821 Other seborrheic keratosis: Secondary | ICD-10-CM | POA: Diagnosis not present

## 2024-06-17 ENCOUNTER — Ambulatory Visit: Attending: Internal Medicine | Admitting: Internal Medicine

## 2024-06-17 ENCOUNTER — Encounter: Payer: Self-pay | Admitting: Internal Medicine

## 2024-06-17 VITALS — BP 100/60 | HR 77 | Temp 97.0°F | Resp 15 | Ht 61.25 in | Wt 170.0 lb

## 2024-06-17 DIAGNOSIS — R899 Unspecified abnormal finding in specimens from other organs, systems and tissues: Secondary | ICD-10-CM | POA: Insufficient documentation

## 2024-06-17 DIAGNOSIS — M353 Polymyalgia rheumatica: Secondary | ICD-10-CM | POA: Diagnosis not present

## 2024-06-17 DIAGNOSIS — M25652 Stiffness of left hip, not elsewhere classified: Secondary | ICD-10-CM | POA: Insufficient documentation

## 2024-06-17 DIAGNOSIS — M25651 Stiffness of right hip, not elsewhere classified: Secondary | ICD-10-CM | POA: Diagnosis not present

## 2024-06-17 NOTE — Progress Notes (Signed)
 Office Visit Note  Patient: Ariel Rios             Date of Birth: 29-Jan-1956           MRN: [id number]             PCP: [place], [name], MD Referring: [place], [name], MD Visit Date: 06/17/2024 Occupation: Data Unavailable  Subjective:  New Patient (Initial Visit) (Abnormal labs)   Discussed the use of AI scribe software for clinical note transcription with the patient, who gave verbal consent to proceed.  History of Present Illness   Ariel Rios is a 67 year old female with a history of polymyalgia rheumatica here for evaluation of persistent joint discomfort and rigidity especially in her hips and knees. She was referred by her primary care doctor.  She experiences joint discomfort and rigidity, primarily in the knees, but denies this being specifically painful. She describes a sensation of a 'big wedge behind both knees' when kneeling and notes difficulty with activities such as kneeling, getting into her car, and picking up her feet. Rest does not alleviate her symptoms, and she feels 'rigid and inflexible.' This improves after moving a while such as wlaking her dogs or exercise with stationary bike.  Her history of PMR was previously managed effectively with prednisone . This condition started shortly before diagnosis with locally metastatic vulvar melanoma which was initially treated with Keytruda . She had to stop the medication after a few months due to intolerance and subsequently switched to alternative immunotherapy. She has been experiencing these symptoms following the discontinuation of Keytruda  after six to seven months of treatment due to adverse effects. During that time, she was on a prednisone  taper, starting at 40 mg daily and reducing to 10 mg daily, which alleviated her symptoms but had to stop again at recommendation of her oncologist. Currently, she takes NSAIDs like ibuprofen  as needed.  She has a history of a shoulder injury from a fall caused by her dog  with some ongoing symptoms and reduced strength especially reaching overhead. She sustained a left wrist fracture from a workplace fall slipping on wet floor. She has had a DEXA scan with a T-score of -2.0, a history of long-term steroid use, and cancer treatment. She takes vitamin D  in a multivitamin with normal levels at PCP office labs in July. She has not been on osteoporosis treatment.  Her physical activity includes walking her dog and using a stationary bike for 30 minutes daily, which she can do without discomfort once she is moving. However, she experiences difficulty getting on and off the bike. She wore compression socks while working for leg swelling. She has recently retired from a full-time job. No recent visible swelling in legs, reports feeling 'rigid and inflexible' without pain, and notes difficulty with activities requiring knee flexion.       Activities of Daily Living:  Patient reports morning stiffness for 0 none.   Patient Denies nocturnal pain.  Difficulty dressing/grooming: Reports Difficulty climbing stairs: Reports Difficulty getting out of chair: Reports Difficulty using hands for taps, buttons, cutlery, and/or writing: Denies  Review of Systems  Constitutional:  Negative for fatigue.  HENT:  Negative for mouth sores and mouth dryness.   Eyes:  Negative for dryness.  Respiratory:  Negative for shortness of breath.   Cardiovascular:  Positive for palpitations. Negative for chest pain.  Gastrointestinal:  Negative for blood in stool, constipation and diarrhea.  Endocrine: Negative for increased urination.  Genitourinary:  Negative for involuntary urination.  Musculoskeletal:  Positive for gait problem, muscle weakness and muscle tenderness. Negative for joint pain, joint pain, joint swelling, myalgias, morning stiffness and myalgias.  Skin:  Negative for color change, rash, hair loss and sensitivity to sunlight.  Allergic/Immunologic: Negative for susceptible to  infections.  Neurological:  Negative for dizziness and headaches.  Hematological:  Negative for swollen glands.  Psychiatric/Behavioral:  Positive for sleep disturbance. Negative for depressed mood. The patient is not nervous/anxious.     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/18/2024   Left wrist fracture 04/30/2023   Palpitation 04/16/2023   Prediabetes 10/06/2022   Lymphadenopathy 09/27/2022   Atherosclerosis of aorta 12/12/2021   Deficiency anemia 04/12/2021   Hypokalemia 04/12/2021   Genetic testing 03/31/2021   Nausea without vomiting 02/28/2021   Family history of melanoma    Melanoma of vulva (HCC) 12/17/2020   PMR (polymyalgia rheumatica) 11/14/2020   Class 1 obesity with body mass index (BMI) of 34.0 to 34.9 in adult 08/23/2020   Vitamin D  deficiency, unspecified 12/09/2018   B12 deficiency 12/09/2018   Asymptomatic microscopic hematuria 04/17/2016   Bilateral lower extremity edema 01/24/2016   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   Hyperlipidemia 01/24/2016   Routine general medical examination at a health care facility 11/17/2014   SEBORRHEIC KERATOSIS, INFLAMED 03/29/2010   CERUMEN IMPACTION, BILATERAL 03/10/2010   VAGINITIS, ATROPHIC 03/10/2010   Venous (peripheral) insufficiency 12/23/2007    Past Medical History:  Diagnosis Date   Anemia    Arthritis    BMI 34.0-34.9,adult    Cancer (HCCmelanoma of vulva with LN involvement March 2022   melanoma   Complication of anesthesia    hard to wake up after anesthesia   Family history of melanoma    History of kidney stones    Melanotic macule of vulvar labia    Migraine    Polymyalgia rheumatica    Venous insufficiency     Family History  Problem Relation Age of Onset   Diabetes Mother    Cancer Mother     AVM Father    Melanoma Father        multiple melanomas dx between 69-90   Cancer Father 58       tumor on kidney   Kidney disease Father    Cancer Paternal Grandfather        laryngeal cancer   Cancer Daughter 17       appendiceal cancer   Obesity Daughter    Breast cancer Neg Hx    Ovarian cancer Neg Hx    Colon cancer Neg Hx    Uterine cancer Neg Hx    Colon polyps Neg Hx    Esophageal cancer Neg Hx    Stomach cancer Neg Hx    Rectal cancer Neg Hx    Past Surgical History:  Procedure Laterality Date   Cataract Right 05/22/2022   CATARACT EXTRACTION Left 06/05/2022   COLONOSCOPY     EYE SURGERY     Cataracts   INGUINAL LYMPHADENECTOMY Right 09/27/2022   Procedure: RIGHT INGUINAL LYMPH NODE DEBULKING;  Surgeon: Viktoria Comer SAUNDERS, MD;  Location: [facility];  Service: Gynecology;  Laterality: Right;   VULVECTOMY  01/19/2021   done at [facility], had sentinel node biopsy also   WRIST SURGERY  05/15/2023   Left   Social History   Tobacco Use   Smoking status: Never    Passive exposure: Never   Smokeless tobacco: Never  Vaping Use   Vaping status: Never Used  Substance Use Topics   Alcohol use: Yes    Comment: occasional   Drug use: No   Social History   Social History Narrative   Not on file     Immunization History  Administered Date(s) Administered   Fluad Quad(high Dose 65+) 10/06/2022   Influenza Split 08/29/2011, 06/18/2012   Influenza,inj,Quad PF,6+ Mos 07/29/2013, 06/08/2014, 06/21/2015, 07/04/2016, 07/23/2018, 07/25/2019, 07/26/2020   Influenza-Unspecified 07/23/2018   PNEUMOCOCCAL CONJUGATE-20 10/06/2022   Pneumococcal Polysaccharide-23 11/23/2020   Td 09/11/2005   Tdap 11/17/2014   Zoster Recombinant(Shingrix ) 03/19/2018, 05/21/2018     Objective: Vital Signs: BP 100/60 (BP Location: Left Arm, Patient Position: Sitting, Cuff Size: Normal)   Pulse 77   Temp (!) 97 F (36.1 C)   Resp 15   Ht 5' 1.25 (1.556 m)   Wt 170 lb (77.1 kg)    BMI 31.86 kg/m    Physical Exam Eyes:     Conjunctiva/sclera: Conjunctivae normal.  Cardiovascular:     Rate and Rhythm: Normal rate and regular rhythm.  Pulmonary:     Effort: Pulmonary effort is normal.     Breath sounds: Normal breath sounds.  Musculoskeletal:     Right lower leg: No edema.     Left lower leg: No edema.  Lymphadenopathy:     Cervical: No cervical adenopathy.  Skin:    General: Skin is warm and dry.     Findings: No rash.  Neurological:     Mental Status: She is alert.  Psychiatric:        Mood and Affect: Mood normal.      Musculoskeletal Exam:  Shoulders full ROM no tenderness or swelling Elbows full ROM no tenderness or swelling Wrists full ROM no tenderness or swelling Fingers full ROM no tenderness or swelling Restricted FABER ROM, difficulty standing from low seat without arm assist, some pain or discomfort with resisted hip flexion Knees full ROM no tenderness or swelling, patellofemoral crepitus Ankles full ROM no tenderness or swelling    Investigation: No additional findings.  Imaging: CT CHEST ABDOMEN PELVIS W CONTRAST Result Date: 06/13/2024 CLINICAL DATA:  History of vulvar melanoma, follow-up. * Tracking Code: BO * EXAM: CT CHEST, ABDOMEN, AND PELVIS WITH CONTRAST TECHNIQUE: Multidetector CT imaging of the chest, abdomen and pelvis was performed following the standard protocol during bolus administration of intravenous contrast. RADIATION DOSE REDUCTION: This exam was performed according to the departmental dose-optimization program which includes automated exposure control, adjustment of the mA and/or kV according to patient size and/or use of iterative reconstruction technique. CONTRAST:  OMNIPAQUE  IOHEXOL  300 MG/ML  SOLN COMPARISON:  Multiple priors including CT November 30, 2023 FINDINGS: CT CHEST FINDINGS Cardiovascular: Aortic atherosclerosis. Normal caliber thoracic aorta. Normal size heart. No significant pericardial  effusion/thickening. Mediastinum/Nodes: No suspicious thyroid  nodule. No pathologically enlarged mediastinal, hilar or axillary lymph nodes. Small to moderate hiatal hernia. Lungs/Pleura: Biapical pleural-parenchymal scarring. Scattered atelectasis/scarring most prominent in the lung bases. Osteophyte related scarring in the paramedian right lower lobe. No suspicious pulmonary nodules or masses. Musculoskeletal: No aggressive lytic or blastic lesion of bone. Thoracic spondylosis. CT ABDOMEN PELVIS FINDINGS Hepatobiliary: Cysts in the left lobe of the liver. Hepatic steatosis. No solid enhancing hepatic lesion. Gallbladder is unremarkable. No biliary ductal dilation. Pancreas: No pancreatic ductal dilation  or evidence of acute inflammation. Spleen: No splenomegaly. Adrenals/Urinary Tract: No suspicious adrenal nodule/mass. Bilateral renal sinus cysts. No hydronephrosis. Urinary bladder is unremarkable for degree of distension. Stomach/Bowel: Small-moderate hiatal hernia. No pathologic dilation of small or large bowel. Extensive colonic diverticulosis. Vascular/Lymphatic: No significant vascular findings are present. No enlarged abdominal or pelvic lymph nodes. Reproductive: Uterus and bilateral adnexa are unremarkable. Other: No significant abdominopelvic free fluid. Right inguinal canal surgical clips. Musculoskeletal: No aggressive lytic or blastic lesion of bone. Lumbar spondylosis. IMPRESSION: 1. No evidence of metastatic disease within the chest, abdomen, or pelvis. 2. Hepatic steatosis. 3. Small-moderate hiatal hernia. 4. Extensive colonic diverticulosis without evidence of acute diverticulitis. 5. Aortic atherosclerosis. Aortic Atherosclerosis (ICD10-I70.0). Electronically Signed   By: Reyes Holder M.D.   On: 06/13/2024 11:08    Recent Labs: Lab Results  Component Value Date   WBC 3.9 (L) 04/18/2024   HGB 12.0 04/18/2024   PLT 300.0 04/18/2024   NA 140 04/18/2024   K 3.4 (L) 04/18/2024   CL 101  04/18/2024   CO2 30 04/18/2024   GLUCOSE 83 04/18/2024   BUN 16 04/18/2024   CREATININE 0.41 04/18/2024   BILITOT 0.6 09/24/2023   ALKPHOS 54 09/24/2023   AST 17 09/24/2023   ALT 14 09/24/2023   PROT 7.2 09/24/2023   ALBUMIN 4.3 09/24/2023   CALCIUM 9.5 04/18/2024   GFRAA 89 08/23/2020    Speciality Comments: No specialty comments available.  Procedures:  No procedures performed Allergies: Patient has no known allergies.   Assessment / Plan:     Visit Diagnoses: PMR (polymyalgia rheumatica) - Plan: Sedimentation rate, C-reactive protein, CK Hip joint stiffness, bilateral - Plan: Sedimentation rate, C-reactive protein, CK, Ambulatory referral to Physical Therapy Chronic stiffness and limited mobility in hip flexors, affecting quality of life. Differential includes residual PMR effects, immunotherapy side effects, or osteoarthritis. Normal inflammatory markers in July reduce likelihood of active PMR. Possible muscle weakness or tightness residual from previous inflammation. Review of abdominal/pelvis CT imaging shows multilevel DDD but no significant appearing hip osteoarthritis stands out. - Order serum inflammatory marker tests recheck for elevation. - Order CK level. - Refer to physical therapy for hip mobility exercises.  Osteoarthritis of knee Osteoarthritis with knee crepitus and discomfort, contributing to joint stiffness and mobility issues.  Osteoporosis with fragility fracture Osteoporosis with T-score of -2.0, history of cancer treatment, steroid exposure, and fragility fracture. High risk of decreased bone density and fractures without treatment. If following up here longer term will discuss osteoporosis treatment else could f/u for this with PCP. Would be considering bisphosphonate treatment for my first choice in her case.  Orders: Orders Placed This Encounter  Procedures   Sedimentation rate   C-reactive protein   CK   Ambulatory referral to Physical Therapy    No orders of the defined types were placed in this encounter.    Follow-Up Instructions: Return in about 3 months (around 09/16/2024), or if symptoms worsen or fail to improve, for New pt ?PMR/OP PT ref f/u 3mos PRN.   Ariel LELON Ester, MD  Note - This record has been created using [employer].  Chart creation errors have been sought, but may not always  have been located. Such creation errors do not reflect on  the standard of medical care.

## 2024-06-18 ENCOUNTER — Other Ambulatory Visit: Payer: Self-pay

## 2024-06-18 LAB — SEDIMENTATION RATE: Sed Rate: 28 mm/h (ref 0–30)

## 2024-06-18 LAB — C-REACTIVE PROTEIN: CRP: 38.2 mg/L — ABNORMAL HIGH (ref <8.0)

## 2024-06-18 LAB — CK: Total CK: 43 U/L (ref 20–243)

## 2024-06-20 ENCOUNTER — Ambulatory Visit

## 2024-06-20 VITALS — BP 118/62 | HR 70 | Temp 98.4°F | Ht 61.0 in | Wt 170.1 lb

## 2024-06-20 DIAGNOSIS — Z0000 Encounter for general adult medical examination without abnormal findings: Secondary | ICD-10-CM

## 2024-06-20 NOTE — Progress Notes (Signed)
 Subjective:   Ariel Rios is a 68 y.o. who presents for a Medicare Wellness preventive visit.  As a reminder, Annual Wellness Visits don't include a physical exam, and some assessments may be limited, especially if this visit is performed virtually. We may recommend an in-person follow-up visit with your provider if needed.  Visit Complete: In person    Persons Participating in Visit: Patient.  AWV Questionnaire: Yes: Patient Medicare AWV questionnaire was completed by the patient on 06/13/24; I have confirmed that all information answered by patient is correct and no changes since this date.  Cardiac Risk Factors include: advanced age (>75men, >31 women)     Objective:    Today's Vitals   06/20/24 0909  BP: 118/62  Pulse: 70  Temp: 98.4 F (36.9 C)  TempSrc: Oral  SpO2: 96%  Weight: 170 lb 1.6 oz (77.2 kg)  Height: 5' 1 (1.549 m)   Body mass index is 32.14 kg/m.     06/20/2024    9:27 AM 05/29/2023    9:13 AM 04/30/2023   12:03 AM 09/27/2022    9:14 AM 09/13/2022    8:03 AM 09/04/2022   11:25 AM 02/24/2022    4:01 PM  Advanced Directives  Does Patient Have a Medical Advance Directive? No Yes No No No No No  Type of Furniture conservator/restorer;Living will       Does patient want to make changes to medical advance directive?  No - Patient declined     Yes (MAU/Ambulatory/Procedural Areas - Information given)  Copy of Healthcare Power of Attorney in Chart?  No - copy requested       Would patient like information on creating a medical advance directive? No - Patient declined No - Patient declined No - Patient declined No - Patient declined Yes (MAU/Ambulatory/Procedural Areas - Information given) No - Patient declined No - Patient declined    Current Medications (verified) Outpatient Encounter Medications as of 06/20/2024  Medication Sig   acetaminophen  (TYLENOL ) 650 MG CR tablet Take 650 mg by mouth every 8 (eight) hours as needed for pain.    calcium carbonate (TUMS - DOSED IN MG ELEMENTAL CALCIUM) 500 MG chewable tablet Chew 1-2 tablets by mouth daily as needed for indigestion or heartburn.   hydrochlorothiazide  (HYDRODIURIL ) 25 MG tablet Take 1 tablet (25 mg total) by mouth daily as needed. (Patient taking differently: Take 25 mg by mouth daily.)   ibuprofen  (ADVIL ) 400 MG tablet Take 400 mg by mouth every 6 (six) hours as needed for moderate pain. (Patient taking differently: Take 400 mg by mouth daily.)   Menthol-Methyl Salicylate (SALONPAS PAIN RELIEF PATCH EX) Apply 1 patch topically daily as needed (pain).   Multiple Vitamins-Minerals (MULTIVITAMIN WOMEN 50+ PO) Take 1 tablet by mouth daily.   potassium chloride  SA (KLOR-CON  M) 20 MEQ tablet TAKE 1 TABLET BY MOUTH EVERY DAY   Turmeric 01-999 MG CAPS    [DISCONTINUED] prochlorperazine  (COMPAZINE ) 10 MG tablet Take 1 tablet (10 mg total) by mouth every 6 (six) hours as needed for nausea or vomiting. (Patient not taking: Reported on 03/07/2021)   No facility-administered encounter medications on file as of 06/20/2024.    Allergies (verified) Patient has no known allergies.   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   melanoma   Complication of anesthesia  hard to wake up after anesthesia   Family history of melanoma    History of kidney stones    Melanotic macule of vulvar labia    Migraine    Polymyalgia rheumatica    Venous insufficiency    Past Surgical History:  Procedure Laterality Date   Cataract Right 05/22/2022   CATARACT EXTRACTION Left 06/05/2022   COLONOSCOPY     EYE SURGERY     Cataracts   INGUINAL LYMPHADENECTOMY Right 09/27/2022   Procedure: RIGHT INGUINAL LYMPH NODE DEBULKING;  Surgeon: Viktoria Comer SAUNDERS, MD;  Location: [facility];  Service: Gynecology;  Laterality: Right;   VULVECTOMY  01/19/2021   done at [facility], [place], had sentinel  node biopsy also   WRIST SURGERY  05/15/2023   Left   Family History  Problem Relation Age of Onset   Diabetes Mother    Cancer Mother    AVM Father    Melanoma Father        multiple melanomas dx between 61-90   Cancer Father 30       tumor on kidney   Kidney disease Father    Cancer Paternal Grandfather        laryngeal cancer   Cancer Daughter 14       appendiceal cancer   Obesity Daughter    Breast cancer Neg Hx    Ovarian cancer Neg Hx    Colon cancer Neg Hx    Uterine cancer Neg Hx    Colon polyps Neg Hx    Esophageal cancer Neg Hx    Stomach cancer Neg Hx    Rectal cancer Neg Hx    Social History   Socioeconomic History   Marital status: Married    Spouse name: Not on file   Number of children: 2   Years of education: Not on file   Highest education level: 12th grade  Occupational History   Occupation: Estate manager/land agent   Occupation: stocking  Tobacco Use   Smoking status: Never    Passive exposure: Never   Smokeless tobacco: Never  Vaping Use   Vaping status: Never Used  Substance and Sexual Activity   Alcohol use: Yes    Comment: occasional   Drug use: No   Sexual activity: Not Currently    Birth control/protection: Post-menopausal  Other Topics Concern   Not on file  Social History Narrative   Not on file   Social Drivers of Health   Financial Resource Strain: Low Risk  (06/20/2024)   Overall Financial Resource Strain (CARDIA)    Difficulty of Paying Living Expenses: Not hard at all  Food Insecurity: No Food Insecurity (06/20/2024)   Hunger Vital Sign    Worried About Running Out of Food in the Last Year: Never true    Ran Out of Food in the Last Year: Never true  Transportation Needs: No Transportation Needs (06/20/2024)   PRAPARE - Administrator, Civil Service (Medical): No    Lack of Transportation (Non-Medical): No  Physical Activity: Sufficiently Active (06/20/2024)   Exercise Vital Sign    Days of Exercise per Week: 7 days     Minutes of Exercise per Session: 30 min  Recent Concern: Physical Activity - Insufficiently Active (04/11/2024)   Exercise Vital Sign    Days of Exercise per Week: 7 days    Minutes of Exercise per Session: 20 min  Stress: No Stress Concern Present (06/20/2024)   [employer] of Occupational Health - Occupational Stress Questionnaire  Feeling of Stress: Not at all  Social Connections: Moderately Isolated (06/20/2024)   Social Connection and Isolation Panel    Frequency of Communication with Friends and Family: More than three times a week    Frequency of Social Gatherings with Friends and Family: More than three times a week    Attends Religious Services: Never    Database administrator or Organizations: No    Attends Engineer, structural: Never    Marital Status: Married    Tobacco Counseling Counseling given: Not Answered    Clinical Intake:  Pre-visit preparation completed: Yes  Pain : No/denies pain     BMI - recorded: 32.14 Nutritional Status: BMI > 30  Obese Nutritional Risks: None Diabetes: No  Lab Results  Component Value Date   HGBA1C 5.8 04/18/2024   HGBA1C 5.8 10/06/2022   HGBA1C 5.9 12/12/2021     How often do you need to have someone help you when you read instructions, pamphlets, or other written materials from your doctor or pharmacy?: 1 - Never  Interpreter Needed?: No  Information entered by :: Rojelio Blush LPN   Activities of Daily Living     06/20/2024    9:19 AM 06/13/2024   10:02 AM  In your present state of health, do you have any difficulty performing the following activities:  Hearing? 0 0  Vision? 0 0  Difficulty concentrating or making decisions? 0 0  Walking or climbing stairs? 0 1  Dressing or bathing? 0 1  Doing errands, shopping? 0 0  Preparing Food and eating ? N N  Using the Toilet? N Y  In the past six months, have you accidently leaked urine? N N  Do you have problems with loss of bowel control? N N   Managing your Medications? N N  Managing your Finances? N N  Housekeeping or managing your Housekeeping? N N    Patient Care Team: [place], [name], MD as PCP - General (Family Medicine)  I have updated your Care Teams any recent Medical Services you may have received from other providers in the past year.     Assessment:   This is a routine wellness examination for [place].  Hearing/Vision screen Hearing Screening - Comments:: Denies hearing difficulties   Vision Screening - Comments:: Wears rx glasses - up to date with routine eye exams with  [facility]   Goals Addressed               This Visit's Progress     Increase physical activity (pt-stated)        Remain active.       Depression Screen     06/20/2024    9:08 AM 04/18/2024    6:54 AM 04/16/2023    9:52 AM 10/06/2022    7:04 AM 12/12/2021    7:04 AM 05/04/2020   10:59 AM 04/30/2017   10:21 AM  PHQ 2/9 Scores  PHQ - 2 Score 0 0 0 0 0 0 0  PHQ- 9 Score   4        Fall Risk     06/20/2024    9:26 AM 06/13/2024   10:02 AM 04/16/2023    9:52 AM 04/12/2023    9:55 PM 10/06/2022    7:03 AM  Fall Risk   Falls in the past year? 0 0 1 0 0  Number falls in past yr: 0 0 0  0  Injury with Fall? 0 1 0  0  Risk for fall due to : No Fall Risks  Other (Comment)  Other (Comment)  Follow up Falls evaluation completed  Falls evaluation completed  Falls evaluation completed      Data saved with a previous flowsheet row definition    MEDICARE RISK AT HOME:  Medicare Risk at Home Any stairs in or around the home?: No If so, are there any without handrails?: No Home free of loose throw rugs in walkways, pet beds, electrical cords, etc?: Yes Adequate lighting in your home to reduce risk of falls?: Yes Life alert?: No Use of a cane, walker or w/c?: No Grab bars in the bathroom?: No Shower chair or bench in shower?: No Elevated toilet seat or a handicapped toilet?: No  TIMED UP AND GO:  Was the test performed?   Yes  Length of time to ambulate 10 feet: 10 sec Gait steady and fast without use of assistive device  Cognitive Function: 6CIT completed        06/20/2024    9:27 AM  6CIT Screen  What Year? 0 points  What month? 0 points  What time? 0 points  Count back from 20 0 points  Months in reverse 0 points  Repeat phrase 0 points  Total Score 0 points    Immunizations Immunization History  Administered Date(s) Administered   Fluad Quad(high Dose 65+) 10/06/2022   Influenza Split 08/29/2011, 06/18/2012   Influenza,inj,Quad PF,6+ Mos 07/29/2013, 06/08/2014, 06/21/2015, 07/04/2016, 07/23/2018, 07/25/2019, 07/26/2020   Influenza-Unspecified 07/23/2018   PNEUMOCOCCAL CONJUGATE-20 10/06/2022   Pneumococcal Polysaccharide-23 11/23/2020   Td 09/11/2005   Tdap 11/17/2014   Zoster Recombinant(Shingrix ) 03/19/2018, 05/21/2018    Screening Tests Health Maintenance  Topic Date Due   COVID-19 Vaccine (1) Never done   Influenza Vaccine  04/25/2024   DTaP/Tdap/Td (3 - Td or Tdap) 11/17/2024   Colonoscopy  12/29/2024   Medicare Annual Wellness (AWV)  06/20/2025   Pneumococcal Vaccine: 50+ Years  Completed   DEXA SCAN  Completed   Hepatitis C Screening  Completed   Zoster Vaccines- Shingrix   Completed   HPV VACCINES  Aged Out   Meningococcal B Vaccine  Aged Out   Mammogram  Discontinued    Health Maintenance Items Addressed:   Additional Screening:  Vision Screening: Recommended annual ophthalmology exams for early detection of glaucoma and other disorders of the eye. Is the patient up to date with their annual eye exam?  Yes  Who is the provider or what is the name of the office in which the patient attends annual eye exams? [facility]  Dental Screening: Recommended annual dental exams for proper oral hygiene  Community Resource Referral / Chronic Care Management: CRR required this visit?  No   CCM required this visit?  No   Plan:    I have personally reviewed  and noted the following in the patient's chart:   Medical and social history Use of alcohol, tobacco or illicit drugs  Current medications and supplements including opioid prescriptions. Patient is not currently taking opioid prescriptions. Functional ability and status Nutritional status Physical activity Advanced directives List of other physicians Hospitalizations, surgeries, and ER visits in previous 12 months Vitals Screenings to include cognitive, depression, and falls Referrals and appointments  In addition, I have reviewed and discussed with patient certain preventive protocols, quality metrics, and best practice recommendations. A written personalized care plan for preventive services as well as general preventive health recommendations were provided to patient.   Rojelio ORN  Tanda, LPN   [id number]   After Visit Summary: (In Person-Printed) AVS printed and given to the patient  Notes: Nothing significant to report at this time.

## 2024-06-20 NOTE — Patient Instructions (Addendum)
 [name],  Thank you for taking the time for your Medicare Wellness Visit. I appreciate your continued commitment to your health goals. Please review the care plan we discussed, and feel free to reach out if I can assist you further.  Medicare recommends these wellness visits once per year to help you and your care team stay ahead of potential health issues. These visits are designed to focus on prevention, allowing your provider to concentrate on managing your acute and chronic conditions during your regular appointments.  Please note that Annual Wellness Visits do not include a physical exam. Some assessments may be limited, especially if the visit was conducted virtually. If needed, we may recommend a separate in-person follow-up with your provider.  Ongoing Care Seeing your primary care provider every 3 to 6 months helps us  monitor your health and provide consistent, personalized care.   Referrals If a referral was made during today's visit and you haven't received any updates within two weeks, please contact the referred provider directly to check on the status.  Recommended Screenings:  Health Maintenance  Topic Date Due   COVID-19 Vaccine (1) Never done   Flu Shot  04/25/2024   DTaP/Tdap/Td vaccine (3 - Td or Tdap) 11/17/2024   Colon Cancer Screening  12/29/2024   Medicare Annual Wellness Visit  06/20/2025   Pneumococcal Vaccine for age over 36  Completed   DEXA scan (bone density measurement)  Completed   Hepatitis C Screening  Completed   Zoster (Shingles) Vaccine  Completed   HPV Vaccine  Aged Out   Meningitis B Vaccine  Aged Out   Breast Cancer Screening  Discontinued       06/20/2024    9:27 AM  Advanced Directives  Does Patient Have a Medical Advance Directive? No  Would patient like information on creating a medical advance directive? No - Patient declined   Advance Care Planning is important because it: Ensures you receive medical care that aligns with your  values, goals, and preferences. Provides guidance to your family and loved ones, reducing the emotional burden of decision-making during critical moments.  Vision: Annual vision screenings are recommended for early detection of glaucoma, cataracts, and diabetic retinopathy. These exams can also reveal signs of chronic conditions such as diabetes and high blood pressure.  Dental: Annual dental screenings help detect early signs of oral cancer, gum disease, and other conditions linked to overall health, including heart disease and diabetes.  Please see the attached documents for additional preventive care recommendations.

## 2024-06-22 ENCOUNTER — Other Ambulatory Visit: Payer: Self-pay

## 2024-06-26 DIAGNOSIS — M25651 Stiffness of right hip, not elsewhere classified: Secondary | ICD-10-CM | POA: Diagnosis not present

## 2024-06-26 DIAGNOSIS — M25652 Stiffness of left hip, not elsewhere classified: Secondary | ICD-10-CM | POA: Diagnosis not present

## 2024-07-08 DIAGNOSIS — M25652 Stiffness of left hip, not elsewhere classified: Secondary | ICD-10-CM | POA: Diagnosis not present

## 2024-07-08 DIAGNOSIS — M25651 Stiffness of right hip, not elsewhere classified: Secondary | ICD-10-CM | POA: Diagnosis not present

## 2024-07-11 DIAGNOSIS — M25652 Stiffness of left hip, not elsewhere classified: Secondary | ICD-10-CM | POA: Diagnosis not present

## 2024-07-11 DIAGNOSIS — M25651 Stiffness of right hip, not elsewhere classified: Secondary | ICD-10-CM | POA: Diagnosis not present

## 2024-07-14 DIAGNOSIS — M25651 Stiffness of right hip, not elsewhere classified: Secondary | ICD-10-CM | POA: Diagnosis not present

## 2024-07-14 DIAGNOSIS — M25652 Stiffness of left hip, not elsewhere classified: Secondary | ICD-10-CM | POA: Diagnosis not present

## 2024-07-17 DIAGNOSIS — M25651 Stiffness of right hip, not elsewhere classified: Secondary | ICD-10-CM | POA: Diagnosis not present

## 2024-07-17 DIAGNOSIS — M25652 Stiffness of left hip, not elsewhere classified: Secondary | ICD-10-CM | POA: Diagnosis not present

## 2024-07-22 DIAGNOSIS — M25652 Stiffness of left hip, not elsewhere classified: Secondary | ICD-10-CM | POA: Diagnosis not present

## 2024-07-22 DIAGNOSIS — M25651 Stiffness of right hip, not elsewhere classified: Secondary | ICD-10-CM | POA: Diagnosis not present

## 2024-07-24 DIAGNOSIS — M25651 Stiffness of right hip, not elsewhere classified: Secondary | ICD-10-CM | POA: Diagnosis not present

## 2024-07-24 DIAGNOSIS — M25652 Stiffness of left hip, not elsewhere classified: Secondary | ICD-10-CM | POA: Diagnosis not present

## 2024-08-05 DIAGNOSIS — M25651 Stiffness of right hip, not elsewhere classified: Secondary | ICD-10-CM | POA: Diagnosis not present

## 2024-08-05 DIAGNOSIS — M25652 Stiffness of left hip, not elsewhere classified: Secondary | ICD-10-CM | POA: Diagnosis not present

## 2024-08-07 ENCOUNTER — Inpatient Hospital Stay: Attending: Gynecologic Oncology | Admitting: Gynecologic Oncology

## 2024-08-07 ENCOUNTER — Encounter: Payer: Self-pay | Admitting: Gynecologic Oncology

## 2024-08-07 VITALS — BP 123/69 | HR 77 | Temp 98.4°F | Resp 19 | Wt 166.8 lb

## 2024-08-07 DIAGNOSIS — R599 Enlarged lymph nodes, unspecified: Secondary | ICD-10-CM | POA: Diagnosis not present

## 2024-08-07 DIAGNOSIS — R590 Localized enlarged lymph nodes: Secondary | ICD-10-CM | POA: Insufficient documentation

## 2024-08-07 DIAGNOSIS — Z08 Encounter for follow-up examination after completed treatment for malignant neoplasm: Secondary | ICD-10-CM | POA: Diagnosis not present

## 2024-08-07 DIAGNOSIS — Z9221 Personal history of antineoplastic chemotherapy: Secondary | ICD-10-CM | POA: Diagnosis not present

## 2024-08-07 DIAGNOSIS — Z9079 Acquired absence of other genital organ(s): Secondary | ICD-10-CM | POA: Diagnosis not present

## 2024-08-07 DIAGNOSIS — Z8544 Personal history of malignant neoplasm of other female genital organs: Secondary | ICD-10-CM | POA: Insufficient documentation

## 2024-08-07 DIAGNOSIS — C519 Malignant neoplasm of vulva, unspecified: Secondary | ICD-10-CM

## 2024-08-07 NOTE — Progress Notes (Signed)
 Gynecologic Oncology Return Clinic Visit  08/07/24  Reason for Visit: surveillance  Treatment History: Oncology History Overview Note  BRAF: undetectable (negative)   Melanoma of vulva (HCC)  10/26/2020 Initial Diagnosis   She was noted to have bloody discharge in her underwear.  She denies pain   12/07/2020 Initial Biopsy   FINAL MICROSCOPIC DIAGNOSIS:   A.   RIGHT VULVA, BIOPSY: PRIMARY MALIGNANT MELANOMA OF VULVA, MELANOMA  TABLE BELOW  MALIGNANT MELANOMA (AJCC 8TH EDITION):  PROCEDURE: BIOPSY  SPECIMEN ANATOMIC SITE: RIGHT VULVA  BRESLOW'S DEPTH/MAXIMUM TUMOR THICKNESS: 5.05 MM  CLARK/ANATOMIC LEVEL* IV  MARGINS:  PERIPHERAL: INVOLVED  DEEP: FREE  ULCERATION: PRESENT  MITOTIC INDEX: 11/mm2  LYMPHO-VASCULAR INVASION: PRESENT*  NEUTROTROPISM: ABSENT  TUMOR-INFILTRATING LYMPHOCYTES: NON-BRISK  LYMPH NODES: N/A  PATHOLOGIC STAGE: PT4B NX MX  *VULVAR MELANOMAS MAY NOT HAVE THE SAME BEHAVIOR STAGE FOR STAGE AS SUPERFICIAL SPREADING CUTANEOUS MELANOMA, AND LANDMARKS SUCH AS BRESLOW'S DEPTH MAY NOT APPLY. NONETHELESS, THIS SHOULD BE TREATED AS A PT4B MELANOMA AND COMPLETE EXCISION, WITH POSSIBLE LYMPH NODE EVALUATION, UNDERTAKEN. SEE COMMENT FOR IHC PROFILE.    COMMENT:  Sections show confluent and nested growth of atypical melanocytes along vulvar epithelium in an in-situ melanoma growth pattern at the periphery, with a central large ulcerated nodule of similar atypical  melanocytes, consonant with primary vulvar melanoma. Notably, IHC was performed to evaluate the lineage and is diagnostic for melanoma. Lesional melanocytes are positive with S100, MelanA, and show a high ki-67 uptake, and are negative with CDX-2, synaptophysin, CK20, TTF-1, CK7, CD56, and lymphoid markers CD5, CD3, CD20 highlight only background lymphocytes.    12/17/2020 Initial Diagnosis   Melanoma of vulva (HCC)   12/17/2020 Cancer Staging   Staging form: Melanoma of the Skin, AJCC 8th Edition - Clinical  stage from 12/17/2020: Stage III (rcT4b, cN2, cM0) - Signed by Lonn Hicks, MD on 10/13/2022 Stage prefix: Recurrence   12/30/2020 PET scan   1. The only potential hypermetabolic finding of note is a small flat focus of accentuated metabolic activity along the anterior vaginal vestibule/perineum, maximum SUV 6.5. Most common cause for this type of appearance would be a trace amount of incontinence of urinary FDG, but given the proximity to the reported vulvar melanoma, this small focus is considered indeterminate. 2. Other imaging findings of potential clinical significance: Aortic Atherosclerosis (ICD10-I70.0). Small type 1 hiatal hernia. Palatine tonsilloliths. Colonic diverticulosis.   01/18/2021 Surgery   Preoperative Diagnosis: Vulvar melanoma  Postoperative Diagnosis: Same  Procedures performed: Wide radical vulvectomy, R inguinofemoral sentinel LN biopsy  Attending Surgeon: Comer Dollar, MD  Fellow: Maurine DELENA Mule  Resident: Andriette Clause, MD; Katelyn Rittenhouse, MD  Anesthesia: General endotracheal  Findings:  1. Vulva with biopsy site changes at R labia minora adjacent to the clitoris. No visible tumor. 2. Inguinofemoral nodes not clinically enlarged or abnormal.   Specimens:  1. R inguinal sentinel LN, hot and blue 2. Portion of vulva 3. Superior deep margin    01/18/2021 Pathology Results   Only melanoma in situ seen in final resection, SLN negative   02/08/2021 - 03/22/2021 Chemotherapy         03/29/2021 Genetic Testing   Negative genetic testing on the Multi-cancer +RNA panel.  The report date is March 29, 2021.  The Multi-Gene Panel offered by [facility] includes sequencing and/or deletion duplication testing of the following 84 genes: AIP, ALK, APC, ATM, AXIN2,BAP1,  BARD1, BLM, BMPR1A, BRCA1, BRCA2, BRIP1, CASR, CDC73, CDH1, CDK4, CDKN1B, CDKN1C, CDKN2A (p14ARF), CDKN2A (p16INK4a), CEBPA,  CHEK2, CTNNA1, DICER1, DIS3L2, EGFR (c.2369C>T, p.Thr790Met variant only),  EPCAM (Deletion/duplication testing only), FH, FLCN, GATA2, GPC3, GREM1 (Promoter region deletion/duplication testing only), HOXB13 (c.251G>A, p.Gly84Glu), HRAS, KIT, MAX, MEN1, MET, MITF (c.952G>A, p.Glu318Lys variant only), MLH1, MSH2, MSH3, MSH6, MUTYH, NBN, NF1, NF2, NTHL1, PALB2, PDGFRA, PHOX2B, PMS2, POLD1, POLE, POT1, PRKAR1A, PTCH1, PTEN, RAD50, RAD51C, RAD51D, RB1, RECQL4, RET, RUNX1, SDHAF2, SDHA (sequence changes only), SDHB, SDHC, SDHD, SMAD4, SMARCA4, SMARCB1, SMARCE1, STK11, SUFU, TERC, TERT, TMEM127, TP53, TSC1, TSC2, VHL, WRN and WT1.     04/29/2021 PET scan   1. No vulvar mass or residual hypermetabolism. 2. No findings suspicious for metastatic disease.   11/28/2021 Pathology Results   Vulvar biopsy - fibrosis c/w scar. Small junctional nevus. No melanoma identified.   08/31/2022 PET scan   1. Enlarging RIGHT inguinal lymph node suspicious for metastatic disease. This shows only mild to moderate increased metabolic activity but displays FDG uptake greater than blood pool and shows abnormal morphology. Tissue sampling may be helpful. 2. No additional signs of disease in the neck, chest, abdomen or pelvis. 3. Signs of pelvic floor dysfunction and probable cystocele. 4. Chronic fracture of RIGHT inferior pubic ramus signs of healing since previous imaging. 5. Aortic atherosclerosis. 6. Moderate to large hiatal hernia.   Aortic Atherosclerosis (ICD10-I70.0).   09/04/2022 Pathology Results   FINAL MICROSCOPIC DIAGNOSIS:   A. LYMPH NODE, RIGHT INGUINAL, BIOPSY:  Metastatic melanoma.  See comment.   COMMENT:  The tumor cells are loosely cohesive with foci of cytoplasmic pigment. Immunohistochemistry is positive with Melan-A, S100 and SOX10.  The morphology and immunophenotype are consistent with metastatic melanoma.      09/27/2022 Surgery   Surgery: Excision of enlarged right inguinal lymph node   Operative findings: 2 cm palpable but mobile right inguinal lymph node,  hyperpigmented. No other palpable adenopathy.   11/13/2022 -  Chemotherapy   Patient is on Treatment Plan : MELANOMA Nivolumab  (480) q28d     01/03/2023 Imaging   1. Enlarged right inguinal lymph node measuring 1.1 cm in short axis, similar to prior PET-CT. 2. No other potential sites of metastatic disease noted elsewhere in the chest, abdomen or pelvis. 3. Moderate-sized hiatal hernia. 4. Colonic diverticulosis without evidence of acute diverticulitis at this time. 5. Aortic atherosclerosis. 6. Additional incidental findings, as above.     03/26/2023 Imaging   CT ABDOMEN PELVIS W CONTRAST  Result Date: 03/27/2023 CLINICAL DATA:  Melanoma, assess treatment response * Tracking Code: BO * EXAM: CT ABDOMEN AND PELVIS WITH CONTRAST TECHNIQUE: Multidetector CT imaging of the abdomen and pelvis was performed using the standard protocol following bolus administration of intravenous contrast. RADIATION DOSE REDUCTION: This exam was performed according to the departmental dose-optimization program which includes automated exposure control, adjustment of the mA and/or kV according to patient size and/or use of iterative reconstruction technique. CONTRAST:  OMNIPAQUE  IOHEXOL  300 MG/ML  SOLN COMPARISON:  CT scan chest, abdomen and pelvis from 01/01/2023. FINDINGS: Lower chest: There are subpleural atelectatic changes in the visualized lung bases. No overt consolidation. No pleural effusion. The heart is normal in size. No pericardial effusion. 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 Non-cirrhotic configuration. No suspicious mass. Note is made of a lobulated 2.4 x 4.2 cm cyst in the left hepatic lobe, stable since the prior study. There is a stable subcentimeter hypoattenuating focus in the left hepatic lobe, segment 4B, which is too small to adequately characterize but appears unchanged since  the prior study and favored benign as well. No  intrahepatic or extrahepatic bile duct dilation. No calcified gallstones. Normal gallbladder wall thickness. No pericholecystic inflammatory changes. Pancreas: Unremarkable. No pancreatic ductal dilatation or surrounding inflammatory changes. Spleen: Within normal limits. No focal lesion. Adrenals/Urinary Tract: Adrenal glands are unremarkable. No suspicious renal mass. Redemonstration of several sinus cysts in the left kidney. No hydronephrosis. No renal or ureteric calculi. Urinary bladder is under distended, precluding optimal assessment. However, no large mass or stones identified. No perivesical fat stranding. Stomach/Bowel: No disproportionate dilation of the small or large bowel loops. No evidence of abnormal bowel wall thickening or inflammatory changes. The appendix is unremarkable. There are multiple diverticula throughout the colon, without imaging signs of diverticulitis. There is a small-to-moderate hiatal hernia. Vascular/Lymphatic: No ascites or pneumoperitoneum. No abdominal or pelvic lymphadenopathy, by size criteria. Previously noted right inguinal lymph node (series 2, image 110 of study from 01/01/2023) adjacent to the deepest surgical staple is no longer seen on this exam. There was also another smaller lymph node (series 2, image 112 of study dated 01/01/2023), which is also not seen on today's exam. No aneurysmal dilation of the major abdominal arteries. Reproductive: The uterus is unremarkable. No large adnexal mass. Other: There is a tiny fat containing umbilical hernia. There are metallic staples and postsurgical changes in the right inguinal region. Musculoskeletal: No suspicious osseous lesions. There are mild multilevel degenerative changes in the visualized spine. IMPRESSION: 1. No evidence of metastatic melanoma in the abdomen or pelvis. Previously noted right inguinal lymph nodes are no longer seen on today's exam. 2. Multiple other nonacute observations, as described above.  Electronically Signed   By: Ree Molt M.D.   On: 03/27/2023 15:11      08/14/2023 Imaging   CT ABDOMEN PELVIS W CONTRAST  Result Date: 08/17/2023 CLINICAL DATA:  History of vulvar melanoma diagnosed in 2022 with prior recurrence in right inguinal lymph node, on immunotherapy. * Tracking Code: BO * EXAM: CT ABDOMEN AND PELVIS WITH CONTRAST TECHNIQUE: Multidetector CT imaging of the abdomen and pelvis was performed using the standard protocol following bolus administration of intravenous contrast. RADIATION DOSE REDUCTION: This exam was performed according to the departmental dose-optimization program which includes automated exposure control, adjustment of the mA and/or kV according to patient size and/or use of iterative reconstruction technique. CONTRAST:  OMNIPAQUE  IOHEXOL  300 MG/ML  SOLN COMPARISON:  CT abdomen and pelvis dated 03/26/2023 FINDINGS: Lower chest: No focal consolidation or pulmonary nodule in the lung bases. No pleural effusion or pneumothorax demonstrated. Partially imaged heart size is normal. Hepatobiliary: Similar segment 2 cyst. Unchanged segment 4 subcentimeter hypodensity (2:27), too small to characterize but also likely cysts. No intra or extrahepatic biliary ductal dilation. Normal gallbladder. Pancreas: No focal lesions or main ductal dilation. Spleen: Normal in size without focal abnormality. Adrenals/Urinary Tract: No adrenal nodules. No suspicious renal mass, calculi or hydronephrosis. Simple left parapelvic cysts. No specific follow-up imaging recommended. No focal bladder wall thickening. Stomach/Bowel: Small hiatal hernia. Normal appearance of the stomach. No evidence of bowel wall thickening, distention, or inflammatory changes. Colonic diverticulosis without acute diverticulitis. Normal appendix. Vascular/Lymphatic: Aortic atherosclerosis. No enlarged abdominal or pelvic lymph nodes. Reproductive: No adnexal masses. Other: No free fluid, fluid collection, or free  air. Musculoskeletal: No acute or abnormal lytic or blastic osseous lesions. Multilevel degenerative changes of the partially imaged thoracic and lumbar spine. Postsurgical changes of the right inguinal region. IMPRESSION: 1. No evidence of metastatic disease in the abdomen  or pelvis. 2.  Aortic Atherosclerosis (ICD10-I70.0). Electronically Signed   By: Limin  Xu M.D.   On: 08/17/2023 14:16      11/30/2023 Imaging   CT C/A/P: 1. No evidence of metastatic disease within the chest, abdomen or pelvis. 2. Moderate size hiatal hernia. 3. Colonic diverticulosis without findings of acute diverticulitis. 4. Aortic atherosclerosis    Interval History: Doing well.  Denies any vulvar pain, lesions, itching, bleeding, or discharge.  Noticed some looser stools after starting tumeric this fall, stopped it and bowel function is normalizing.  Past Medical/Surgical 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   melanoma   Complication of anesthesia    hard to wake up after anesthesia   Family history of melanoma    History of kidney stones    Melanotic macule of vulvar labia    Migraine    Polymyalgia rheumatica    Venous insufficiency     Past Surgical History:  Procedure Laterality Date   Cataract Right 05/22/2022   CATARACT EXTRACTION Left 06/05/2022   COLONOSCOPY     EYE SURGERY     Cataracts   INGUINAL LYMPHADENECTOMY Right 09/27/2022   Procedure: RIGHT INGUINAL LYMPH NODE DEBULKING;  Surgeon: Viktoria Comer SAUNDERS, MD;  Location: [facility];  Service: Gynecology;  Laterality: Right;   VULVECTOMY  01/19/2021   done at [facility], had sentinel node biopsy also   WRIST SURGERY  05/15/2023   Left    Family History  Problem Relation Age of Onset   Diabetes Mother    Cancer Mother    AVM Father    Melanoma Father        multiple melanomas dx between 65-90   Cancer Father 69        tumor on kidney   Kidney disease Father    Cancer Paternal Grandfather        laryngeal cancer   Cancer Daughter 62       appendiceal cancer   Obesity Daughter    Breast cancer Neg Hx    Ovarian cancer Neg Hx    Colon cancer Neg Hx    Uterine cancer Neg Hx    Colon polyps Neg Hx    Esophageal cancer Neg Hx    Stomach cancer Neg Hx    Rectal cancer Neg Hx     Social History   Socioeconomic History   Marital status: Married    Spouse name: Not on file   Number of children: 2   Years of education: Not on file   Highest education level: 12th grade  Occupational History   Occupation: estate manager/land agent   Occupation: stocking  Tobacco Use   Smoking status: Never    Passive exposure: Never   Smokeless tobacco: Never  Vaping Use   Vaping status: Never Used  Substance and Sexual Activity   Alcohol use: Yes    Comment: occasional   Drug use: No   Sexual activity: Not Currently    Birth control/protection: Post-menopausal  Other Topics Concern   Not on file  Social History Narrative   Not on file   Social Drivers of Health   Financial Resource Strain: Low Risk  (06/20/2024)   Overall Financial Resource Strain (CARDIA)    Difficulty of Paying Living Expenses: Not hard at all  Food Insecurity: No Food Insecurity (06/20/2024)   Hunger Vital  Sign    Worried About Programme Researcher, Broadcasting/film/video in the Last Year: Never true    Ran Out of Food in the Last Year: Never true  Transportation Needs: No Transportation Needs (06/20/2024)   PRAPARE - Administrator, Civil Service (Medical): No    Lack of Transportation (Non-Medical): No  Physical Activity: Sufficiently Active (06/20/2024)   Exercise Vital Sign    Days of Exercise per Week: 7 days    Minutes of Exercise per Session: 30 min  Recent Concern: Physical Activity - Insufficiently Active (04/11/2024)   Exercise Vital Sign    Days of Exercise per Week: 7 days    Minutes of Exercise per Session: 20 min  Stress: No Stress Concern  Present (06/20/2024)   [employer] of Occupational Health - Occupational Stress Questionnaire    Feeling of Stress: Not at all  Social Connections: Moderately Isolated (06/20/2024)   Social Connection and Isolation Panel    Frequency of Communication with Friends and Family: More than three times a week    Frequency of Social Gatherings with Friends and Family: More than three times a week    Attends Religious Services: Never    Database Administrator or Organizations: No    Attends Engineer, Structural: Never    Marital Status: Married    Current Medications:  Current Outpatient Medications:    acetaminophen  (TYLENOL ) 650 MG CR tablet, Take 650 mg by mouth every 8 (eight) hours as needed for pain., Disp: , Rfl:    calcium carbonate (TUMS - DOSED IN MG ELEMENTAL CALCIUM) 500 MG chewable tablet, Chew 1-2 tablets by mouth daily as needed for indigestion or heartburn., Disp: , Rfl:    hydrochlorothiazide  (HYDRODIURIL ) 25 MG tablet, Take 1 tablet (25 mg total) by mouth daily as needed. (Patient taking differently: Take 25 mg by mouth daily.), Disp: 90 tablet, Rfl: 2   ibuprofen  (ADVIL ) 400 MG tablet, Take 400 mg by mouth every 6 (six) hours as needed for moderate pain., Disp: , Rfl:    Menthol-Methyl Salicylate (SALONPAS PAIN RELIEF PATCH EX), Apply 1 patch topically daily as needed (pain)., Disp: , Rfl:    Multiple Vitamins-Minerals (MULTIVITAMIN WOMEN 50+ PO), Take 1 tablet by mouth daily., Disp: , Rfl:    potassium chloride  SA (KLOR-CON  M) 20 MEQ tablet, TAKE 1 TABLET BY MOUTH EVERY DAY, Disp: 90 tablet, Rfl: 1  Review of Systems: Denies appetite changes, fevers, chills, fatigue, unexplained weight changes. Denies hearing loss, neck lumps or masses, mouth sores, ringing in ears or voice changes. Denies cough or wheezing.  Denies shortness of breath. Denies chest pain or palpitations. Denies leg swelling. Denies abdominal distention, pain, blood in stools, constipation,  diarrhea, nausea, vomiting, or early satiety. Denies pain with intercourse, dysuria, frequency, hematuria or incontinence. Denies hot flashes, pelvic pain, vaginal bleeding or vaginal discharge.   Denies joint pain, back pain or muscle pain/cramps. Denies itching, rash, or wounds. Denies dizziness, headaches, numbness or seizures. Denies swollen lymph nodes or glands, denies easy bruising or bleeding. Denies anxiety, depression, confusion, or decreased concentration.  Physical Exam: BP 123/69 (BP Location: Left Arm, Patient Position: Sitting)   Pulse 77   Temp 98.4 F (36.9 C) (Oral)   Resp 19   Wt 166 lb 12.8 oz (75.7 kg)   SpO2 96%   BMI 31.52 kg/m  General: Alert, oriented, no acute distress. HEENT: Normocephalic, atraumatic, sclera anicteric. Chest: Clear to auscultation bilaterally.  No wheezes or rhonchi. Cardiovascular: Regular  rate and rhythm, no murmurs. Abdomen: soft, nontender.  Normoactive bowel sounds.  No masses or hepatosplenomegaly appreciated.   Extremities: Grossly normal range of motion.  Warm, well perfused.  No edema bilaterally. Skin: No rashes or lesions noted. Lymphatics: No cervical, supraclavicular, or inguinal adenopathy. GU: External female genitalia notable for changes from prior surgery along the right vulva.  There is mild discoloration along the most inferior aspect of the incision, stable.   No other areas of discoloration, lesions, or atypical vasculature.   No new lesions.  Moderate atrophy of the vulva, loss of posterior labia bilaterally.  On speculum exam, cervix normal appearing.  No vaginal lesions noted. Vaginal mucosa normal on palpation.  Laboratory & Radiologic Studies: CT C/A/P: 06/11/24 1. No evidence of metastatic disease within the chest, abdomen, or pelvis. 2. Hepatic steatosis. 3. Small-moderate hiatal hernia. 4. Extensive colonic diverticulosis without evidence of acute diverticulitis. 5. Aortic atherosclerosis.  Assessment &  Plan: SHELAGH RAYMAN is a 68 y.o. woman with recurrent vulvar melanoma, treated with excision of inguinal lymph node (09/2022), completed nivolumab  (08/2023).  Imaging in 12/2022 with enlarged right inguinal lymph node, resolved as of 03/2023 imaging.  She completed 12 cycles of nivolumab  at the end of 2024.   Groin ultrasound in May 2025 with several borderline lymph nodes, normal in architecture. Last imaging in 05/2024 was negative for recurrent disease.   The patient is overall doing well, NED on exam today.    Saw her rheumatologist since her last visit with me and physical therapy is recommended for tight hip flexors.  She has noticed some improvement since starting physical therapy.   She was noted to have mildly enlarged lymph nodes although normal architecture on her last groin ultrasound.  Most recent CT scan was negative for recurrent disease.  We will plan on her next groin ultrasound in December.     I will see the patient back for 52-month follow-up and exam.  20 minutes of total time was spent for this patient encounter, including preparation, face-to-face counseling with the patient and coordination of care, and documentation of the encounter.  Comer Dollar, MD  Division of Gynecologic Oncology  Department of Obstetrics and Gynecology  [facility] [place]  [facility]

## 2024-08-07 NOTE — Patient Instructions (Signed)
 It was good to see you today.  I do not see or feel any evidence of cancer recurrence on your exam.  I will see you for follow-up in 3 months.  As always, if you develop any new and concerning symptoms before your next visit, please call to see me sooner.

## 2024-08-12 DIAGNOSIS — M25652 Stiffness of left hip, not elsewhere classified: Secondary | ICD-10-CM | POA: Diagnosis not present

## 2024-08-12 DIAGNOSIS — M25651 Stiffness of right hip, not elsewhere classified: Secondary | ICD-10-CM | POA: Diagnosis not present

## 2024-08-28 ENCOUNTER — Other Ambulatory Visit: Payer: Self-pay | Admitting: Family Medicine

## 2024-08-28 DIAGNOSIS — E876 Hypokalemia: Secondary | ICD-10-CM

## 2024-09-02 ENCOUNTER — Ambulatory Visit (HOSPITAL_COMMUNITY)
Admission: RE | Admit: 2024-09-02 | Discharge: 2024-09-02 | Disposition: A | Source: Ambulatory Visit | Attending: Gynecologic Oncology | Admitting: Gynecologic Oncology

## 2024-09-02 DIAGNOSIS — R599 Enlarged lymph nodes, unspecified: Secondary | ICD-10-CM | POA: Diagnosis not present

## 2024-09-02 DIAGNOSIS — C519 Malignant neoplasm of vulva, unspecified: Secondary | ICD-10-CM | POA: Diagnosis not present

## 2024-09-02 DIAGNOSIS — Z854A Personal history of malignant neoplasm of fallopian tube(s): Secondary | ICD-10-CM | POA: Diagnosis not present

## 2024-09-08 ENCOUNTER — Ambulatory Visit: Payer: Self-pay | Admitting: Gynecologic Oncology

## 2024-09-28 NOTE — Progress Notes (Signed)
 "  Office Visit Note  Patient: Ariel Rios             Date of Birth: [birth year]           MRN: [id number]             PCP: Jordan, [name], MD Referring: Jordan, [name], MD Visit Date: 09/30/2024   Subjective:   History of Present Illness: Ariel Rios is a 69 y.o. female here for follow up for PMR and bilateral hips and knee pain. Lab testing at last visit noted elevated CRP but normal sed rate once completely off prednisone . She completed the recommended course of PT for her hip pain with a large amount of improvement. She has been maintaining her exercises pretty regularly at home without difficulty. She takes only occasional oral NSAID Previously, her inflammation markers had increased, but she has been off steroids and is feeling well without medication. She discarded outdated prednisone  and has not required any since her symptoms have improved. She has not experienced any recent illnesses. Her child had a sinus infection and her grandson had pneumonia recently. 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     Previous HPI 06/17/24 Ariel Rios is a 69 year old female with a history of polymyalgia rheumatica here for evaluation of persistent joint discomfort and rigidity especially in her hips and knees. She was referred by her primary care doctor.   She experiences joint discomfort and rigidity, primarily in the knees, but denies this being specifically painful. She describes a sensation of a 'big wedge behind both knees' when kneeling and notes difficulty with activities such as kneeling, getting into her car, and picking up her feet. Rest does not alleviate her symptoms, and she feels 'rigid and inflexible.' This improves after moving a while such as wlaking her dogs or exercise with stationary bike.   Her history of PMR was previously managed effectively with prednisone .  This condition started shortly before diagnosis with locally metastatic vulvar melanoma which was initially treated with Keytruda . She had to stop the medication after a few months due to intolerance and subsequently switched to alternative immunotherapy. She has been experiencing these symptoms following the discontinuation of Keytruda  after six to seven months of treatment due to adverse effects. During that time, she was on a prednisone  taper, starting at 40 mg daily and reducing to 10 mg daily, which alleviated her symptoms but had to stop again at recommendation of her oncologist. Currently, she takes NSAIDs like ibuprofen  as needed.   She has a history of a shoulder injury from a fall caused by her dog with some ongoing symptoms and reduced strength especially reaching overhead. She sustained a left wrist fracture from a workplace fall slipping on wet floor. She has had a DEXA scan with a T-score of -2.0, a history of long-term steroid use, and cancer treatment. She takes vitamin D  in a multivitamin with normal levels at PCP office labs in July. She has not been on osteoporosis treatment.   Her physical activity includes walking her dog and using a stationary bike for 30 minutes daily, which she can do without discomfort once she is moving. However, she experiences difficulty getting on and off the bike. She wore compression socks while working for leg swelling. She has recently retired from a full-time job. No recent visible swelling in legs, reports feeling '  rigid and inflexible' without pain, and notes difficulty with activities requiring knee flexion.    Review of Systems  Constitutional:  Negative for fatigue.  HENT:  Negative for mouth sores and mouth dryness.   Eyes:  Negative for dryness.  Respiratory:  Negative for shortness of breath.   Cardiovascular:  Positive for palpitations. Negative for chest pain.  Gastrointestinal:  Positive for diarrhea. Negative for blood in stool and  constipation.  Endocrine: Negative for increased urination.  Genitourinary:  Negative for involuntary urination.  Musculoskeletal:  Positive for gait problem, muscle weakness and morning stiffness. Negative for joint pain, joint pain, joint swelling, myalgias, muscle tenderness and myalgias.  Skin:  Positive for sensitivity to sunlight. Negative for color change, rash and hair loss.  Allergic/Immunologic: Negative for susceptible to infections.  Neurological:  Negative for dizziness and headaches.  Hematological:  Negative for swollen glands.  Psychiatric/Behavioral:  Negative for depressed mood and sleep disturbance. The patient is not nervous/anxious.     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/18/2024   Left wrist fracture 04/30/2023   Palpitation 04/16/2023   Prediabetes 10/06/2022   Lymphadenopathy 09/27/2022   Atherosclerosis of aorta 12/12/2021   Deficiency anemia 04/12/2021   Hypokalemia 04/12/2021   Genetic testing 03/31/2021   Nausea without vomiting 02/28/2021   Family history of melanoma    Melanoma of vulva (HCC) 12/17/2020   PMR (polymyalgia rheumatica) 11/14/2020   Class 1 obesity with body mass index (BMI) of 34.0 to 34.9 in adult 08/23/2020   Vitamin D  deficiency, unspecified 12/09/2018   B12 deficiency 12/09/2018   Asymptomatic microscopic hematuria 04/17/2016   Bilateral lower extremity edema 01/24/2016   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   Hyperlipidemia 01/24/2016   Routine general medical examination at a health care facility 11/17/2014   SEBORRHEIC KERATOSIS, INFLAMED 03/29/2010   CERUMEN IMPACTION, BILATERAL 03/10/2010   VAGINITIS, ATROPHIC 03/10/2010   Venous (peripheral) insufficiency 12/23/2007    Past Medical History:  Diagnosis Date   Anemia     Arthritis    BMI 34.0-34.9,adult    Cancer (HCCmelanoma of vulva with LN involvement March 2022   melanoma   Complication of anesthesia    hard to wake up after anesthesia   Family history of melanoma    History of kidney stones    Melanotic macule of vulvar labia    Migraine    Polymyalgia rheumatica    Venous insufficiency     Family History  Problem Relation Age of Onset   Diabetes Mother    Cancer Mother    AVM Father    Melanoma Father        multiple melanomas dx between 7-90   Cancer Father 55       tumor on kidney   Kidney disease Father    Cancer Paternal Grandfather        laryngeal cancer   Cancer Daughter 20       appendiceal cancer   Obesity Daughter    Breast cancer Neg Hx    Ovarian cancer Neg Hx    Colon cancer Neg Hx    Uterine cancer Neg Hx    Colon polyps Neg Hx    Esophageal cancer Neg Hx    Stomach cancer Neg Hx    Rectal cancer Neg Hx  Past Surgical History:  Procedure Laterality Date   Cataract Right 05/22/2022   CATARACT EXTRACTION Left 06/05/2022   COLONOSCOPY     EYE SURGERY     Cataracts   INGUINAL LYMPHADENECTOMY Right 09/27/2022   Procedure: RIGHT INGUINAL LYMPH NODE DEBULKING;  Surgeon: Viktoria Comer SAUNDERS, MD;  Location: [facility];  Service: Gynecology;  Laterality: Right;   VULVECTOMY  01/19/2021   done at [facility], had sentinel node biopsy also   WRIST SURGERY  05/15/2023   Left   Social History   Social History Narrative   Not on file   Immunization History  Administered Date(s) Administered   Fluad Quad(high Dose 65+) 10/06/2022   Influenza Split 08/29/2011, 06/18/2012   Influenza,inj,Quad PF,6+ Mos 07/29/2013, 06/08/2014, 06/21/2015, 07/04/2016, 07/23/2018, 07/25/2019, 07/26/2020   Influenza-Unspecified 07/23/2018   PNEUMOCOCCAL CONJUGATE-20 10/06/2022   Pneumococcal Polysaccharide-23 11/23/2020   Td 09/11/2005   Tdap 11/17/2014   Zoster Recombinant(Shingrix ) 03/19/2018, 05/21/2018     Objective: Vital  Signs: BP 106/68   Pulse 70   Temp (!) 97.3 F (36.3 C)   Resp 16   Ht 5' 1 (1.549 m)   Wt 168 lb 9.6 oz (76.5 kg)   BMI 31.86 kg/m    Physical Exam Eyes:     Conjunctiva/sclera: Conjunctivae normal.  Cardiovascular:     Rate and Rhythm: Normal rate and regular rhythm.  Pulmonary:     Effort: Pulmonary effort is normal.     Breath sounds: Normal breath sounds.  Skin:    General: Skin is warm and dry.  Neurological:     Mental Status: She is alert.  Psychiatric:        Mood and Affect: Mood normal.      Musculoskeletal Exam:  Shoulders full ROM no tenderness or swelling Elbows full ROM no tenderness or swelling Wrists full ROM no tenderness or swelling Fingers full ROM no tenderness or swelling Restricted FABER ROM, difficulty standing from low seat without arm assist, some pain or discomfort with resisted hip flexion Knees full ROM no tenderness or swelling, patellofemoral crepitus Ankles full ROM no tenderness or swelling  Investigation: No additional findings.  Imaging: No results found.   Recent Labs: Lab Results  Component Value Date   WBC 3.9 (L) 04/18/2024   HGB 12.0 04/18/2024   PLT 300.0 04/18/2024   NA 140 04/18/2024   K 3.4 (L) 04/18/2024   CL 101 04/18/2024   CO2 30 04/18/2024   GLUCOSE 83 04/18/2024   BUN 16 04/18/2024   CREATININE 0.41 04/18/2024   BILITOT 0.6 09/24/2023   ALKPHOS 54 09/24/2023   AST 17 09/24/2023   ALT 14 09/24/2023   PROT 7.2 09/24/2023   ALBUMIN 4.3 09/24/2023   CALCIUM 9.5 04/18/2024   GFRAA 89 08/23/2020    Speciality Comments: No specialty comments available.  Procedures:  No procedures performed Allergies: Patient has no known allergies.   Assessment / Plan:     Visit Diagnoses:  Polymyalgia rheumatica Bilateral hip joint pain Inflammation markers elevated but stable. Symptoms improved with physical therapy previous bursitis and tendinitis problems better compared to previous exam. No current medication  needed. Discussed monitoring for symptoms can f/u PRN for reassessment. - Continue home exercises as instructed by physical therapy. - Schedule follow-up if symptoms worsen or new issues arise.       Orders: No orders of the defined types were placed in this encounter.  No orders of the defined types were placed in this encounter.  Follow-Up Instructions: Return if symptoms worsen or fail to improve.   Lonni LELON Ester, MD  Note - This record has been created using [employer].  Chart creation errors have been sought, but may not always  have been located. Such creation errors do not reflect on  the standard of medical care. "

## 2024-09-30 ENCOUNTER — Ambulatory Visit: Attending: Internal Medicine | Admitting: Internal Medicine

## 2024-09-30 ENCOUNTER — Encounter: Payer: Self-pay | Admitting: Internal Medicine

## 2024-09-30 VITALS — BP 106/68 | HR 70 | Temp 97.3°F | Resp 16 | Ht 61.0 in | Wt 168.6 lb

## 2024-09-30 DIAGNOSIS — M353 Polymyalgia rheumatica: Secondary | ICD-10-CM

## 2024-09-30 DIAGNOSIS — M25652 Stiffness of left hip, not elsewhere classified: Secondary | ICD-10-CM | POA: Diagnosis not present

## 2024-09-30 DIAGNOSIS — M25651 Stiffness of right hip, not elsewhere classified: Secondary | ICD-10-CM | POA: Diagnosis not present

## 2024-09-30 NOTE — Patient Instructions (Signed)
 Keep up the great work! We can just follow up as needed if experiencing new or worsening joint symptoms.

## 2024-11-06 ENCOUNTER — Inpatient Hospital Stay: Attending: Gynecologic Oncology | Admitting: Gynecologic Oncology

## 2025-06-26 ENCOUNTER — Ambulatory Visit
